# Patient Record
Sex: Male | Born: 1950
Health system: Southern US, Community
[De-identification: ages and names within clinical notes are randomized; demographics above are authoritative.]

## PROBLEM LIST (undated history)

## (undated) DIAGNOSIS — I639 Cerebral infarction, unspecified: Secondary | ICD-10-CM

## (undated) DIAGNOSIS — M549 Dorsalgia, unspecified: Secondary | ICD-10-CM

## (undated) DIAGNOSIS — R569 Unspecified convulsions: Secondary | ICD-10-CM

## (undated) DIAGNOSIS — G459 Transient cerebral ischemic attack, unspecified: Secondary | ICD-10-CM

## (undated) DIAGNOSIS — F32A Depression, unspecified: Secondary | ICD-10-CM

## (undated) DIAGNOSIS — R413 Other amnesia: Secondary | ICD-10-CM

## (undated) DIAGNOSIS — I714 Abdominal aortic aneurysm, without rupture, unspecified: Secondary | ICD-10-CM

## (undated) DIAGNOSIS — G4752 REM sleep behavior disorder: Secondary | ICD-10-CM

## (undated) DIAGNOSIS — N400 Enlarged prostate without lower urinary tract symptoms: Secondary | ICD-10-CM

## (undated) DIAGNOSIS — IMO0002 Reserved for concepts with insufficient information to code with codable children: Secondary | ICD-10-CM

## (undated) DIAGNOSIS — G40209 Localization-related (focal) (partial) symptomatic epilepsy and epileptic syndromes with complex partial seizures, not intractable, without status epilepticus: Secondary | ICD-10-CM

## (undated) DIAGNOSIS — E0590 Thyrotoxicosis, unspecified without thyrotoxic crisis or storm: Secondary | ICD-10-CM

## (undated) DIAGNOSIS — B182 Chronic viral hepatitis C: Secondary | ICD-10-CM

## (undated) DIAGNOSIS — G5600 Carpal tunnel syndrome, unspecified upper limb: Secondary | ICD-10-CM

## (undated) DIAGNOSIS — G8929 Other chronic pain: Secondary | ICD-10-CM

## (undated) DIAGNOSIS — G250 Essential tremor: Secondary | ICD-10-CM

## (undated) DIAGNOSIS — R269 Unspecified abnormalities of gait and mobility: Secondary | ICD-10-CM

## (undated) DIAGNOSIS — N390 Urinary tract infection, site not specified: Secondary | ICD-10-CM

## (undated) DIAGNOSIS — R251 Tremor, unspecified: Secondary | ICD-10-CM

## (undated) DIAGNOSIS — I10 Essential (primary) hypertension: Secondary | ICD-10-CM

## (undated) DIAGNOSIS — F329 Major depressive disorder, single episode, unspecified: Secondary | ICD-10-CM

## (undated) DIAGNOSIS — M1990 Unspecified osteoarthritis, unspecified site: Secondary | ICD-10-CM

## (undated) DIAGNOSIS — K7460 Unspecified cirrhosis of liver: Secondary | ICD-10-CM

## (undated) DIAGNOSIS — I679 Cerebrovascular disease, unspecified: Secondary | ICD-10-CM

## (undated) DIAGNOSIS — S0990XA Unspecified injury of head, initial encounter: Secondary | ICD-10-CM

## (undated) DIAGNOSIS — F419 Anxiety disorder, unspecified: Secondary | ICD-10-CM

## (undated) HISTORY — DX: Localization-related (focal) (partial) symptomatic epilepsy and epileptic syndromes with complex partial seizures, not intractable, without status epilepticus: G40.209

## (undated) HISTORY — DX: Unspecified cirrhosis of liver: K74.60

## (undated) HISTORY — DX: Reserved for concepts with insufficient information to code with codable children: IMO0002

## (undated) HISTORY — DX: Chronic viral hepatitis C: B18.2

## (undated) HISTORY — PX: NECK SURGERY: SHX720

## (undated) HISTORY — DX: Essential (primary) hypertension: I10

## (undated) HISTORY — DX: Tremor, unspecified: R25.1

## (undated) HISTORY — DX: Depression, unspecified: F32.A

## (undated) HISTORY — DX: REM sleep behavior disorder: G47.52

## (undated) HISTORY — DX: Other chronic pain: G89.29

## (undated) HISTORY — DX: Thyrotoxicosis, unspecified without thyrotoxic crisis or storm: E05.90

## (undated) HISTORY — DX: Abdominal aortic aneurysm, without rupture: I71.4

## (undated) HISTORY — DX: Other amnesia: R41.3

## (undated) HISTORY — DX: Essential tremor: G25.0

## (undated) HISTORY — DX: Benign prostatic hyperplasia without lower urinary tract symptoms: N40.0

## (undated) HISTORY — DX: Unspecified abnormalities of gait and mobility: R26.9

## (undated) HISTORY — DX: Anxiety disorder, unspecified: F41.9

## (undated) HISTORY — DX: Abdominal aortic aneurysm, without rupture, unspecified: I71.40

## (undated) HISTORY — DX: Carpal tunnel syndrome, unspecified upper limb: G56.00

## (undated) HISTORY — DX: Major depressive disorder, single episode, unspecified: F32.9

## (undated) HISTORY — PX: OTHER SURGICAL HISTORY: SHX169

## (undated) HISTORY — DX: Dorsalgia, unspecified: M54.9

## (undated) HISTORY — PX: HAND SURGERY: SHX662

## (undated) HISTORY — DX: Cerebrovascular disease, unspecified: I67.9

## (undated) HISTORY — DX: Transient cerebral ischemic attack, unspecified: G45.9

## (undated) HISTORY — DX: Unspecified osteoarthritis, unspecified site: M19.90

---

## 1978-06-30 DIAGNOSIS — B182 Chronic viral hepatitis C: Secondary | ICD-10-CM

## 1978-06-30 HISTORY — DX: Chronic viral hepatitis C: B18.2

## 1997-11-21 ENCOUNTER — Ambulatory Visit (HOSPITAL_COMMUNITY): Admission: RE | Admit: 1997-11-21 | Discharge: 1997-11-22 | Payer: Self-pay | Admitting: *Deleted

## 1998-08-13 ENCOUNTER — Emergency Department (HOSPITAL_COMMUNITY): Admission: EM | Admit: 1998-08-13 | Discharge: 1998-08-13 | Payer: Self-pay | Admitting: Emergency Medicine

## 1998-08-13 ENCOUNTER — Encounter: Payer: Self-pay | Admitting: Emergency Medicine

## 1999-03-17 ENCOUNTER — Encounter: Payer: Self-pay | Admitting: *Deleted

## 1999-03-17 ENCOUNTER — Ambulatory Visit (HOSPITAL_COMMUNITY): Admission: RE | Admit: 1999-03-17 | Discharge: 1999-03-17 | Payer: Self-pay | Admitting: *Deleted

## 1999-05-01 ENCOUNTER — Encounter: Payer: Self-pay | Admitting: *Deleted

## 1999-05-01 ENCOUNTER — Ambulatory Visit (HOSPITAL_COMMUNITY): Admission: RE | Admit: 1999-05-01 | Discharge: 1999-05-01 | Payer: Self-pay | Admitting: *Deleted

## 1999-05-23 ENCOUNTER — Encounter: Payer: Self-pay | Admitting: Emergency Medicine

## 1999-05-23 ENCOUNTER — Emergency Department (HOSPITAL_COMMUNITY): Admission: EM | Admit: 1999-05-23 | Discharge: 1999-05-23 | Payer: Self-pay | Admitting: Emergency Medicine

## 1999-05-29 ENCOUNTER — Emergency Department (HOSPITAL_COMMUNITY): Admission: EM | Admit: 1999-05-29 | Discharge: 1999-05-29 | Payer: Self-pay | Admitting: Emergency Medicine

## 1999-05-29 ENCOUNTER — Encounter: Payer: Self-pay | Admitting: Emergency Medicine

## 1999-07-31 ENCOUNTER — Emergency Department (HOSPITAL_COMMUNITY): Admission: EM | Admit: 1999-07-31 | Discharge: 1999-07-31 | Payer: Self-pay | Admitting: Emergency Medicine

## 1999-07-31 ENCOUNTER — Encounter: Payer: Self-pay | Admitting: Emergency Medicine

## 1999-09-29 ENCOUNTER — Encounter: Payer: Self-pay | Admitting: Emergency Medicine

## 1999-09-29 ENCOUNTER — Emergency Department (HOSPITAL_COMMUNITY): Admission: EM | Admit: 1999-09-29 | Discharge: 1999-09-29 | Payer: Self-pay | Admitting: Emergency Medicine

## 1999-11-03 ENCOUNTER — Emergency Department (HOSPITAL_COMMUNITY): Admission: EM | Admit: 1999-11-03 | Discharge: 1999-11-03 | Payer: Self-pay | Admitting: Emergency Medicine

## 2000-03-02 ENCOUNTER — Emergency Department (HOSPITAL_COMMUNITY): Admission: EM | Admit: 2000-03-02 | Discharge: 2000-03-02 | Payer: Self-pay | Admitting: Emergency Medicine

## 2002-01-11 ENCOUNTER — Encounter: Payer: Self-pay | Admitting: Emergency Medicine

## 2002-01-11 ENCOUNTER — Emergency Department (HOSPITAL_COMMUNITY): Admission: EM | Admit: 2002-01-11 | Discharge: 2002-01-11 | Payer: Self-pay | Admitting: Emergency Medicine

## 2002-02-01 ENCOUNTER — Ambulatory Visit (HOSPITAL_COMMUNITY): Admission: RE | Admit: 2002-02-01 | Discharge: 2002-02-01 | Payer: Self-pay | Admitting: Internal Medicine

## 2002-02-01 ENCOUNTER — Encounter: Payer: Self-pay | Admitting: Internal Medicine

## 2002-05-27 ENCOUNTER — Ambulatory Visit (HOSPITAL_COMMUNITY): Admission: RE | Admit: 2002-05-27 | Discharge: 2002-05-27 | Payer: Self-pay | Admitting: Internal Medicine

## 2002-05-27 ENCOUNTER — Encounter: Payer: Self-pay | Admitting: Internal Medicine

## 2002-11-28 ENCOUNTER — Emergency Department (HOSPITAL_COMMUNITY): Admission: EM | Admit: 2002-11-28 | Discharge: 2002-11-28 | Payer: Self-pay | Admitting: *Deleted

## 2002-12-10 ENCOUNTER — Encounter (HOSPITAL_COMMUNITY): Admission: RE | Admit: 2002-12-10 | Discharge: 2003-01-09 | Payer: Self-pay | Admitting: Internal Medicine

## 2005-01-17 ENCOUNTER — Encounter: Admission: RE | Admit: 2005-01-17 | Discharge: 2005-04-17 | Payer: Self-pay | Admitting: Anesthesiology

## 2005-05-07 ENCOUNTER — Ambulatory Visit: Payer: Self-pay | Admitting: Orthopedic Surgery

## 2005-12-28 ENCOUNTER — Inpatient Hospital Stay (HOSPITAL_COMMUNITY): Admission: EM | Admit: 2005-12-28 | Discharge: 2005-12-31 | Payer: Self-pay | Admitting: Emergency Medicine

## 2006-09-12 ENCOUNTER — Ambulatory Visit (HOSPITAL_COMMUNITY): Admission: RE | Admit: 2006-09-12 | Discharge: 2006-09-12 | Payer: Self-pay | Admitting: Family Medicine

## 2006-10-05 ENCOUNTER — Inpatient Hospital Stay (HOSPITAL_COMMUNITY): Admission: EM | Admit: 2006-10-05 | Discharge: 2006-10-07 | Payer: Self-pay | Admitting: Emergency Medicine

## 2009-12-29 ENCOUNTER — Emergency Department (HOSPITAL_COMMUNITY): Admission: EM | Admit: 2009-12-29 | Discharge: 2009-12-29 | Payer: Self-pay | Admitting: Family Medicine

## 2010-05-13 ENCOUNTER — Encounter: Payer: Self-pay | Admitting: Internal Medicine

## 2010-05-14 ENCOUNTER — Encounter: Payer: Self-pay | Admitting: Internal Medicine

## 2010-05-16 ENCOUNTER — Emergency Department (HOSPITAL_COMMUNITY): Admission: EM | Admit: 2010-05-16 | Discharge: 2010-05-16 | Payer: Self-pay | Admitting: Emergency Medicine

## 2010-05-18 ENCOUNTER — Emergency Department (HOSPITAL_COMMUNITY): Admission: EM | Admit: 2010-05-18 | Discharge: 2010-05-19 | Payer: Self-pay | Admitting: Emergency Medicine

## 2010-05-30 ENCOUNTER — Ambulatory Visit (HOSPITAL_COMMUNITY): Admission: RE | Admit: 2010-05-30 | Discharge: 2010-05-30 | Payer: Self-pay | Admitting: Family Medicine

## 2010-06-03 ENCOUNTER — Emergency Department (HOSPITAL_COMMUNITY)
Admission: EM | Admit: 2010-06-03 | Discharge: 2010-06-04 | Payer: Self-pay | Source: Home / Self Care | Admitting: Emergency Medicine

## 2010-06-04 ENCOUNTER — Ambulatory Visit (HOSPITAL_COMMUNITY): Admission: RE | Admit: 2010-06-04 | Payer: Self-pay | Admitting: Internal Medicine

## 2010-06-04 ENCOUNTER — Ambulatory Visit (HOSPITAL_COMMUNITY)
Admission: RE | Admit: 2010-06-04 | Discharge: 2010-06-04 | Payer: Self-pay | Source: Home / Self Care | Admitting: Emergency Medicine

## 2010-06-30 DIAGNOSIS — I639 Cerebral infarction, unspecified: Secondary | ICD-10-CM

## 2010-06-30 HISTORY — DX: Cerebral infarction, unspecified: I63.9

## 2010-07-19 ENCOUNTER — Emergency Department (HOSPITAL_COMMUNITY)
Admission: EM | Admit: 2010-07-19 | Discharge: 2010-07-19 | Payer: Self-pay | Source: Home / Self Care | Admitting: Emergency Medicine

## 2010-07-22 LAB — POCT I-STAT, CHEM 8
BUN: 18 mg/dL (ref 6–23)
Calcium, Ion: 1.09 mmol/L — ABNORMAL LOW (ref 1.12–1.32)
Chloride: 104 mEq/L (ref 96–112)
Creatinine, Ser: 1 mg/dL (ref 0.4–1.5)
Glucose, Bld: 80 mg/dL (ref 70–99)
HCT: 45 % (ref 39.0–52.0)
Hemoglobin: 15.3 g/dL (ref 13.0–17.0)
Potassium: 4 mEq/L (ref 3.5–5.1)
Sodium: 140 mEq/L (ref 135–145)
TCO2: 27 mmol/L (ref 0–100)

## 2010-07-22 LAB — POCT CARDIAC MARKERS
CKMB, poc: <1 ng/mL — ABNORMAL LOW (ref 1.0–8.0)
Myoglobin, poc: 44.3 ng/mL (ref 12–200)
Troponin i, poc: <0.05 ng/mL (ref 0.00–0.09)

## 2010-07-30 NOTE — Letter (Signed)
Summary: triage order  triage order   Imported By: Rosine Beat 05/13/2010 12:40:08  _____________________________________________________________________  External Attachment:    Type:   Image     Comment:   External Document  Appended Document: triage order ok as is  Appended Document: triage order mailed instructions to the patient  Appended Document: triage order Pt said he was not having his tcs right now. He will call us when he wants to have his tcs done.

## 2010-07-30 NOTE — Letter (Signed)
Summary: TCS ORDER/TRIAGE  TCS ORDER/TRIAGE   Imported By: Rexene Alberts 05/14/2010 09:44:03  _____________________________________________________________________  External Attachment:    Type:   Image     Comment:   External Document

## 2010-09-10 LAB — CBC
HCT: 44.8 % (ref 39.0–52.0)
Hemoglobin: 15.6 g/dL (ref 13.0–17.0)
MCH: 32.5 pg (ref 26.0–34.0)
MCHC: 34.7 g/dL (ref 30.0–36.0)
MCV: 93.6 fL (ref 78.0–100.0)
Platelets: 183 10*3/uL (ref 150–400)
RBC: 4.79 MIL/uL (ref 4.22–5.81)
RDW: 12.2 % (ref 11.5–15.5)
WBC: 5.6 10*3/uL (ref 4.0–10.5)

## 2010-09-10 LAB — DIFFERENTIAL
Basophils Absolute: 0 10*3/uL (ref 0.0–0.1)
Basophils Relative: 0 % (ref 0–1)
Eosinophils Absolute: 0 10*3/uL (ref 0.0–0.7)
Eosinophils Relative: 0 % (ref 0–5)
Lymphocytes Relative: 24 % (ref 12–46)
Lymphs Abs: 1.3 10*3/uL (ref 0.7–4.0)
Monocytes Absolute: 0.4 10*3/uL (ref 0.1–1.0)
Monocytes Relative: 7 % (ref 3–12)
Neutro Abs: 3.8 10*3/uL (ref 1.7–7.7)
Neutrophils Relative %: 69 % (ref 43–77)

## 2010-09-10 LAB — BASIC METABOLIC PANEL
BUN: 15 mg/dL (ref 6–23)
CO2: 27 mEq/L (ref 19–32)
Calcium: 9 mg/dL (ref 8.4–10.5)
Chloride: 94 mEq/L — ABNORMAL LOW (ref 96–112)
Creatinine, Ser: 0.63 mg/dL (ref 0.4–1.5)
GFR calc Af Amer: >60 mL/min (ref >60)
GFR calc non Af Amer: >60 mL/min (ref >60)
Glucose, Bld: 125 mg/dL — ABNORMAL HIGH (ref 70–99)
Potassium: 3.7 mEq/L (ref 3.5–5.1)
Sodium: 129 mEq/L — ABNORMAL LOW (ref 135–145)

## 2010-09-10 LAB — URINALYSIS, ROUTINE W REFLEX MICROSCOPIC
Bilirubin Urine: NEGATIVE
Glucose, UA: 100 mg/dL — AB
Hgb urine dipstick: NEGATIVE
Ketones, ur: NEGATIVE mg/dL
Nitrite: NEGATIVE
Protein, ur: NEGATIVE mg/dL
Specific Gravity, Urine: >1.03 — ABNORMAL HIGH (ref 1.005–1.030)
Urobilinogen, UA: 0.2 mg/dL (ref 0.0–1.0)
pH: 6 (ref 5.0–8.0)

## 2010-09-10 LAB — RAPID URINE DRUG SCREEN, HOSP PERFORMED
Amphetamines: NOT DETECTED
Barbiturates: NOT DETECTED
Benzodiazepines: POSITIVE — AB
Cocaine: NOT DETECTED
Opiates: NOT DETECTED
Tetrahydrocannabinol: NOT DETECTED

## 2010-09-10 LAB — ETHANOL: Alcohol, Ethyl (B): <5 mg/dL (ref 0–10)

## 2010-11-15 NOTE — Discharge Summary (Signed)
NAME:  Eric Tran, Eric Tran NO.:  [id number]   MEDICAL RECORD NO.:  [id number]          PATIENT TYPE:  INP   LOCATION:  A337                          FACILITY:  [facility]   PHYSICIAN:  Skeet Latch, DO    DATE OF BIRTH:  [birth year]   DATE OF ADMISSION:  10/05/2006  DATE OF DISCHARGE:  04/09/2008LH                               DISCHARGE SUMMARY   DISCHARGE DIAGNOSES:  1. Altered mental status change.  2. Polysubstance abuse.  3. Pneumonia.  4. Dehydration.  5. Hypokalemia.   For details regarding admission diagnoses and History of Present  Illness, please refer to History and Physical.   BRIEF HOSPITAL COURSE:  This is a 60 year old Caucasian male who  presented from jail for altered mental status changes.  The patient has  a history of polysubstance abuse, hypertension and chronic neck pain.  Apparently patient was lethargic for a few days at jail and he was not  taking  his meals or medications.  When patient was brought to the  emergency room he was lethargic, unable to communicate, was found to  have a temperature of 103 and was not able to give any history at that  time.  On admission, patient denied any headache, was cooperative, began  to become more arousable and did not have any new complaints.  Patient  was seen, admitted to the ICU with altered mental status changes.  She  had a drug screen that was positive for benzodiazepines.  Patient did  have a CT of his head without contrast performed which was negative.  Patient also did have a chest x-ray which showed a patchy left lower  infiltrate consistent with pneumonia.  The patient was placed on IV  antibiotics as well as IV fluids.  He was found to a little dehydrated  and tachycardic.  Patient stated previously he was also febrile, had  some leukocytosis, was also placed on IV antibiotics empirically.  Today  patient is afebrile, leukocytosis has resolved, he is no longer  tachycardic.  He is stable and  is ready for discharge at this time.   LABS ON DISCHARGE:  White blood cell count was 10.2, hemoglobin 12.3,  hematocrit 34.3, platelets 194,000.  Sodium 141, potassium 3.3, chloride  109, CO2 27, glucose 119, BUN 2, creatinine 0.41.  Alkaline phosphatase  37, AST 13, ALT 14.  Of note, patient did have a consultation with  Neurology regarding altered mental status changes.  Per Neurology, the  etiology of his decreased level of consciousness and mental status  changes was unclear.  Said possibly in the future patient may need  empiric seizure medication, but at this time will not prescribe any  medicines but he needs to be followed for the recurrent loss of  consciousness or altered mental status changes.  Currently in the room  patient is doing well, is responding to questions appropriately.  He is  alert and oriented and cooperative.   MEDICATIONS ON DISCHARGE:  1. Z-Pak as directed.  2. Augmentin 875 mg one tab p.o. b.i.d.  3. Catapres 0.1 mg p.o. b.i.d.  4. Enalapril  10 mg daily.  5. Fentanyl patch 100 mcg q.3 days.   DISPOSITION:  The patient will be discharged back to jail.   INSTRUCTIONS:  He is to return to regular activity as tolerated.   DIET:  Patient is to maintain a low-sodium heart healthy diet.   FOLLOWUP:  Patient is to followup with the primary physician in the  correction facility in at least 7-10 days, he will continue his  antibiotic therapy.  Patient may need a followup chest x-ray in the next  10-14 days.      Skeet Latch, DO  Electronically Signed     SM/MEDQ  D:  10/07/2006  T:  10/07/2006  Job:  [id number]   cc:   [facility]

## 2010-11-15 NOTE — Procedures (Signed)
NAME:  Eric Tran, Eric Tran NO.:  [id number]   MEDICAL RECORD NO.:  [id number]          PATIENT TYPE:  OUT   LOCATION:  RESP                          FACILITY:  [facility]   PHYSICIAN:  Kofi A. Gerilyn Pilgrim, M.D. DATE OF BIRTH:  05-21-1951   DATE OF PROCEDURE:  09/12/2006  DATE OF DISCHARGE:                              EEG INTERPRETATION   This is a 60 year old man who presents with shaking of the right arm  suspicious for seizure activity.   MEDICATIONS:  Xanax, blood pressure medication and antidepressant.   ANALYSIS:  A 16-channel recording is conducted for 33 minutes.  There is  a well-formed posterior dominant rhythm of 10 hertz which attenuates  with eye opening.  There is beta activity noted in the frontal areas.  Awake and drowsy activities are noted.  Photic stimulation does not  result in any significant changes in the background activity.  There is  no focal slowing, lateralized slowing or epileptiform activity noted.   IMPRESSION:  This is a normal recording of the awake and drowsy states.  A single recording does not rule out epileptic seizures.  If clinically  indicated, a sleep deprived recording could be useful.      Kofi A. Gerilyn Pilgrim, M.D.  Electronically Signed     KAD/MEDQ  D:  09/12/2006  T:  09/13/2006  Job:  [id number]

## 2010-11-15 NOTE — Discharge Summary (Signed)
NAME:  Eric Tran, Eric Tran NO.:  [id number]   MEDICAL RECORD NO.:  [id number]          PATIENT TYPE:  INP   LOCATION:  A203                          FACILITY:  [facility]   PHYSICIAN:  Patrica Duel, M.D.    DATE OF BIRTH:  [birth year]   DATE OF ADMISSION:  12/27/2005  DATE OF DISCHARGE:  07/04/2007LH                                 DISCHARGE SUMMARY   DISCHARGE DIAGNOSES:  1.  Acute withdrawal syndrome.  2.  Chronic pain syndrome with history of cervical disk disease with      radiculopathy, status post fusion and titanium plate placement.  3.  Narcotic overuse.  4.  Hypertension.   For details regarding admission, please refer to the chart.   BRIEF HISTORY:  This 60 year old male with the history as noted above was  incarcerated for illegally distributing controlled substances.  Approximately 36 hours into his incarceration, he experienced severe  tremors, abdominal pain, cramping, diarrhea, and vomiting.  He was released  on bail after four days.  He came to the emergency department with the above  complaints.  He was found to be agitated and mildly hypertensive, as well as  confused.  His CT scan was unremarkable.  His history was very compatible  with acute withdrawal syndrome.  Of note, the patient admits to taking at  least eight Tylenol #4 per day for 20 years or more.  At times, his narcotic  usage was much greater than that.   COURSE IN THE HOSPITAL:  The patient had intermittent confusion and  agitation.  He was treated with Clonidine, Ativan, as well as resumption of  Tylenol #4.  CT scan was repeated with no significant change.  He did have  some lacunar infarcts of the basal ganglia noted.   The patient's blood pressure responded nicely to Clonidine.   Dr. Gerilyn Pilgrim saw the patient and agreed with our assessment.  Fentanyl patch  was suggested by the patient and this was instituted on July 3.  Currently,  the patient is alert, oriented, calm.  His blood  pressure was somewhat low  overnight.  However, it has normalized nicely.  His Clonidine dose will be  decreased as an outpatient.   The patient was seen twice by the ACT team.  Inpatient treatment and/or  commitment were not appropriate by their assessment.  They felt he could be  managed as an outpatient.   Currently, the patient is doing very well and is strongly requesting  discharge.  His appetite has improved. He is fully alert and oriented and  stable for discharge.  He understands the ramifications of his over usage  and, hopefully, will respond to intensive outpatient management and be  better controlled with long-acting opioids in regards to his pain, as well  as narcotic addiction.   DISPOSITION:  Medications will include:  1.  Clonidine 0.1 mg b.i.d.  2.  Enalapril 10 mg daily.  3.  Fentanyl patch 25 mcg per hour, change every three days.  4.  Ativan 1 mg t.i.d. p.r.n. agitation or anxiety.  5.  Tylenol #4  one q.i.d.  He will taper these over two weeks.   He will be followed at [facility] as an outpatient.      Patrica Duel, M.D.  Electronically Signed     MC/MEDQ  D:  12/31/2005  T:  12/31/2005  Job:  [id number]

## 2010-11-15 NOTE — H&P (Signed)
NAME:  Eric Tran, NEEDLE NO.:  [id number]   MEDICAL RECORD NO.:  [id number]         PATIENT TYPE:  INP   LOCATION:  IC08                          FACILITY:  [facility]   PHYSICIAN:  Marcello Moores, MD   DATE OF BIRTH:  24-Jun-1951   DATE OF ADMISSION:  10/05/2006  DATE OF DISCHARGE:  LH                              HISTORY & PHYSICAL   CHIEF COMPLAINT:  He was sent from prison for altered mental status.   HISTORY OF PRESENT ILLNESS:  Eric Tran is a 60 year old man with  history of polysubstance abuse, history of hypertension and history of  chronic neck pain.  Currently, he is in jail and brought from the jail  for altered mental status.  The patient is lethargic and currently  noncommunicative and it was difficult to extract any valuable history  from him, but according to the nurse from the prison, she said that the  patient was lethargic on and off and he was not taking his meals  properly for the last several days.  He had no specific complaints.  She  did not record any fever and she did not use any cough or other  complaints, but today he was more lethargic and noncommunicative and  they brought him to the emergency room.  In the emergency room, his  temperature was 103 and was unable to give any history to the emergency  physician.  When I came to interview him, he responded very few words.  He denied headache and he was cooperative to flex his neck when I  requested that and he said he has no pain when he tries to reflex his  neck.  He responded that he does not know his problem when I ask him to  tell me if he has any problem.  In 2007, he was also admitted with  similar complaints and problems that was attributed at that time likely  secondary to drug withdrawal and he was at prison at that time as well.   REVIEW OF SYSTEMS:  The 10-point review of systems is limited as the  patient's communication is limited.   ALLERGIES:  No known drug allergies.   SOCIAL HISTORY:  He is in prison and is a polysubstance abuse.  We do  not know the specifics.   FAMILY HISTORY:  Not known.   PAST MEDICAL HISTORY:  1. Status post neck surgery with chronic pain.  2. History of hypertension.  3. Polysubstance abuse.   HOME MEDICATIONS:  1. Tylenol with codeine.  2. Enalapril, unspecified dose.   PHYSICAL EXAMINATION:  GENERAL:  The patient is lying in the bed,  lethargic, not communicating well, but no acute respiratory distress.  VITAL SIGNS:  Blood pressure is 138/82, temperature is 103, pulse rate  is 108, respiratory rate 22, saturation is 99% on room air. HEENT:  He  has pink conjunctivae.  Nonicteric sclera.  Pupils are reactive and  equal on both sides, slightly dry buccal mucosa.  Throat is clear.  NECK:  He has supple neck and able to flex his neck actively and  passively.  No pain.  No lymphadenopathy.  CHEST:  Good air entry bilaterally.  No crepitus appreciated.  CARDIAC:  S1-S2 regular.  No murmur, tachycardiac.  ABDOMEN:  Soft.  Normoactive bowel sounds.  No area of tenderness.  EXTREMITIES:  He has dry, multiple skin lesions on both upper  extremities.  A few of them are fresh of a few days with background of  hyperemic areas.  Otherwise, there is no pedal edema and peripheral  pulses are positive.  NEUROLOGIC:  He is lethargic, responding only for a few questions,  otherwise there is not any neurological deficit and there is not any  sign of meningeal irritation.   LABORATORY DATA AND X-RAY FINDINGS:  ABG on room air with pH is 7.5,  pCO2 27, pO2 75, bicarb 23 and saturation is 96%.  CBC white blood cells  19.4, hemoglobin 13.7, hematocrit 39, platelet count is 276.  PT/INR  15.7 and 1.2.  On the chemistries, sodium is 139, potassium is 3.5,  chloride is 102, glucose is 139, BUN 15, creatinine 0.6.  Calcium level  is 9.2.  Liver function test is normal except total bilirubin is 1.9.  Urine drug screen is positive for  benzodiazepine.  Urinalysis is  negative.   Chest x-ray in the preliminary report mentioned some scattered patchy  infiltrate.  CAT scan of the head is in progress.   ASSESSMENT:  1. Altered mental status with lethargy and some confusion, probably      secondary to some drug withdrawal.  The patient was in prison only      for the last 11 days and he started to have this change of behavior      for the last 5-6 days.  This could raise the possibility of not      getting what he was used to get outside, even though it is not easy      to be specific.  2. Dehydration.  The patient was not taking meal and fluid properly      for the last several days and he has tachycardia, a little dry      mucosa and we will rehydrate him with IV fluid.  3. Leukocytosis plus fever and skin lesions.  This could be secondary      to the skin lesions with some infection.  Even though my      methicillin-resistant Staphylococcus aureus suspicion is low, I      will put him on vancomycin today and will consider changing to oral      medications depending on the response of the skin lesions and blood      culture and a general clinical response of the patient.  4. Possible pneumonia.  The patient has already started on Rocephin      and I will continue with the Rocephin and Zithromax.   PLAN:  I will admit the patient on the second floor telemetry.  We will  monitor his vitals and will watch for any abnormal behavior or seizure  and we will act accordingly.      Marcello Moores, MD  Electronically Signed     MT/MEDQ  D:  10/05/2006  T:  10/06/2006  Job:  [id number]

## 2010-11-15 NOTE — Consult Note (Signed)
NAME:  Eric Tran, ZABRISKIE NO.:  [id number]   MEDICAL RECORD NO.:  [id number]          PATIENT TYPE:  INP   LOCATION:  A337                          FACILITY:  [facility]   PHYSICIAN:  Kofi A. Gerilyn Pilgrim, M.D. DATE OF BIRTH:  10-11-1950   DATE OF CONSULTATION:  10/06/2006  DATE OF DISCHARGE:  10/07/2006                                 CONSULTATION   HISTORY OF PRESENT ILLNESS:  This is a 60 year old white male who was  found to be lethargic, noncommunicative and apparently had not been  eating his meals.  There appears to be no specific complaints.  The  patient was taken to the emergency room where he was noted to have right  lower lobe infiltrate and fever.  He was admitted because of pneumonia.  Neurologic consultation was elicited due to be what appears to be  recurrent spells of lethargy and unresponsiveness.  The nursing person  indicates to staff that the patient apparently has been having these  recurrent spells of lethargy, unresponsive even before his current acute  illness.   PAST MEDICAL HISTORY:  1. Hypertension.  2. Polysubstance drug abuse.  3. Chronic neck pain.   ADMISSION MEDICATIONS:  1. Tylenol with codeine.  2. Enalapril.   FAMILY HISTORY:  Not known.   SOCIAL HISTORY:  He is currently in the prison system.  Specific for the  patient does have a history of polysubstance drug abuse.   ALLERGIES:  None known.   REVIEW OF SYSTEMS:  As stated in the history of present illness.  The  patient seems back at his baseline during the evaluation and does not  complain of headache or neck pain.  No focal neurologic problems are  reported.   PHYSICAL EXAMINATION:  VITAL SIGNS:  Temperature 96.8, pulse 76,  respirations 18, blood pressure 119/75.  GENERAL APPEARANCE:  The patient is sitting up in the bed attempting to  eat his meal.  He is awake, alert and converses well.  He did recognize  me from previous encounters.  He is lucid and oriented.  Does  follow  commands well.  No dysarthria or aphasias noted.  CRANIAL NERVE EVALUATION:  Pupils were equal, round, reactive to light  and accommodation.  Extraocular movements are intact.  Visual fields are  full.  Extraocular movements are intact.  Facial muscle strength is  symmetric.  Tongue is midline.  MOTOR:  Examination shows normal tone, bulk and strength.  COORDINATION:  Intact.  There are no tremors or dysmetria.  REFLEXES:  Preserved.  Plantar reflexes were downgoing.  SENSATION:  Normal to temperature and light touch.   LABORATORY DATA:  CT scan shows no acute findings and essentially  unrevealing.  Chest CT shows patchy left lower lobe infiltrate  consistent with pneumonia.   ASSESSMENT:  1. Recurrent spells of lethargy, unresponsive, unclear etiology.  2. Acute pneumonia.   RECOMMENDATIONS:  Agree with the current care.  I would obtain another  EEG.  Did have a previous one done March of this year which was  unrevealing apparently for similar problems.   Thank you for this  consultation.      Kofi A. Gerilyn Pilgrim, M.D.  Electronically Signed     KAD/MEDQ  D:  10/08/2006  T:  10/08/2006  Job:  [id number]

## 2010-11-15 NOTE — Procedures (Signed)
NAME:  BRAYLAN, FAUL NO.:  [id number]   MEDICAL RECORD NO.:  [id number]          PATIENT TYPE:  INP   LOCATION:  A337                          FACILITY:  [facility]   PHYSICIAN:  Kofi A. Gerilyn Pilgrim, M.D. DATE OF BIRTH:  [birth year]   DATE OF PROCEDURE:  10/07/2006  DATE OF DISCHARGE:                              EEG INTERPRETATION   PROCEDURE:  EEG.   CHIEF COMPLAINT:  This is a 60 year old man who presents with episodic  alteration of consciousness suspicious for seizures/nonconvulsive  seizures.   MEDICATIONS:  Tylenol with codeine and enalapril.   ANALYSIS:  This is a 16 channel recording conducted for approximately 20  minutes.  There is a posterior rhythm of 10.5 to 11 hertz which  attenuates with eye opening.  There is beta activity noted in the  frontal areas.  Photic simulation is conducted and there is no  significant change in the background activity.  Brief stage II sleep is  observed.  The patient is noted to have lead popping artifact in the  left temporal region but no clear epileptiform activity is observed.  There is no clear focal or lateralized slowing.   IMPRESSION:  This is a normal recording of awake and sleep states.      Kofi A. Gerilyn Pilgrim, M.D.  Electronically Signed     KAD/MEDQ  D:  10/07/2006  T:  10/07/2006  Job:  [id number]

## 2010-11-15 NOTE — Consult Note (Signed)
NAME:  Eric Tran, Eric Tran NO.:  [id number]   MEDICAL RECORD NO.:  [id number]          PATIENT TYPE:  INP   LOCATION:  A203                          FACILITY:  [facility]   PHYSICIAN:  Kofi A. Gerilyn Pilgrim, M.D. DATE OF BIRTH:  [birth year]   DATE OF CONSULTATION:  12/30/2005  DATE OF DISCHARGE:  12/31/2005                                   CONSULTATION   This is a 60 year old man who has a long history of chronic pain syndrome  due to degenerative changes involving the cervical region.  The patient is  status post cervical fusion with Titanium plate.  He has been on a long  course of opioids, particularly Tylenol with Codeine.  More recently he has  been placed on OxyContin.  The patient apparently was arrested per the chart  on suspicion of illegal activity with opioids and a possible diversion.  He  was incarcerated for 3-4 days when he started having significant withdrawal  symptoms with hallucinations and confusion.  The patient has been  hospitalized and has been started on Tylenol #3 and clonidine.  The patient  has had imaging x2 which has been unremarkable for anything acute.  He has  had elevated blood pressure on admission in the 170's/110 which had improved  since he has been hospitalized.  The patient's cognition has improved.  He  denies illicit activity of these medications but he is currently somewhat  tangential in speech and there is some evidence of confabulation.   PAST MEDICAL HISTORY:  1.  Again, he is status post cervical fusion.  2.  He has chronic skin infection due to MRSA.  3.  Chronic pain syndrome.  4.  There is also a history of back pain and knee pain due to      osteoarthritis.  5.  History of hypertension.   HOME MEDICATIONS:  1.  OxyContin three times a day.  2.  Tylenol #3, which we prescribed both of these medications.  3.  He is also taking blood pressure medication, imipramine.   PHYSICAL EXAMINATION:  Temperature 98.3, pulse 58,  respirations 10-20, blood  pressure 105/70.  HEENT EVALUATION:  Neck is supple.  Head is normocephalic, atraumatic.  SKIN:  Reveals multiple skin eruptions which are chronic.  The patient is  being treated by Dr. Anne Hahn there.  ABDOMEN:  Soft.  EXTREMITIES:  Shows no varicosities or edema.  MENTATION:  Patient is awake.  Initially thought I was Dr. Anne Hahn.  After  several minutes he was able to say my first and second name.  He does not  exhibit hallucinations at this time, although his speech is tangential.  There is also some confabulation of time.  No dysarthria or aphasia is  noted.  He has good fluency noted.  He follows commands well.  CRANIAL NERVE EVALUATION:  Pupils are equal, round and reactive, visual  fields are intact.  Extraocular movements are full.  Facial muscles are  dysmetric.  Tongue is midline, uvula is midline.  Shoulder shrugs normal.  MOTOR EXAMINATION:  Shows normal tone, bulk and strength.  COORDINATION:  There is a little bit of intention tremor bilaterally.  No  clear dysmetria noted.   Head CT shows dilated perivascular spaces involving the basal ganglia but  nothing acute seen.   ASSESSMENT:  1.  Acute confusional state/delirium due to medication withdrawal.  2.  Suspected irregular behavior with opioids.  3.  Chronic pain involving the neck and low back region due to chronic      degenerative disk disease.   RECOMMENDATIONS:  Probably should switch his opioids to a more long-acting  agent such as Fentanyl patch, hopefully he can tolerate this; we can use  antiemetics if nausea develops.   Thanks for this consultation.      Kofi A. Gerilyn Pilgrim, M.D.  Electronically Signed     KAD/MEDQ  D:  01/02/2006  T:  01/02/2006  Job:  [id number]

## 2010-11-15 NOTE — H&P (Signed)
NAME:  Eric Tran, POKORNY NO.:  [id number]   MEDICAL RECORD NO.:  [id number]          PATIENT TYPE:  INP   LOCATION:  A203                          FACILITY:  [facility]   PHYSICIAN:  Patrica Duel, M.D.    DATE OF BIRTH:  Oct 21, 1950   DATE OF ADMISSION:  12/28/2005  DATE OF DISCHARGE:  LH                                HISTORY & PHYSICAL   CHIEF COMPLAINT:  Confusion.   HISTORY OF PRESENT ILLNESS:  This is a 60 year old male with a longstanding  history of hypertension.  He has chronic pain syndrome secondary to cervical  disk disease with chronic radiculopathy.  He is status post fusion and  titanium plate placement of his cervical spine.  He has been treated by Dr.  Sherwood Gambler chronically with Tylenol #4, which he takes approximately eight per  day with reasonable control of his pain.   The patient was apparently investigated and subsequently arrested for  illegally distributing controlled substances.  Details are lacking at this  time.  He was incarcerated for four days.  Thirty-six hours into his  incarceration, the patient began to experience severe tremulousness,  abdominal pain, cramping, diarrhea, as well as vomiting.  The symptoms  continued.  He was released on bail.  He continued to experience these  symptoms and was brought to the emergency room for evaluation, at my urging.   In the emergency department, the patient was somewhat agitated and mildly  hypertensive and confused.  His CT scan was unremarkable.  It is quite  apparent the patient is suffering from acute withdrawal from narcotics after  years of daily use for pain control.   The patient is admitted with acute withdrawal syndrome, and further  evaluation and therapy is indicated.   There is no history of headache, neurologic deficits except for episodic  confusion and agitation, chest pain, shortness of breath, melena,  hematemesis, hematochezia, or genitourinary symptoms.   CURRENT MEDICATIONS:  1.  Enalapril, dose unknown, 1 daily.  2.  Tylenol #4 2 q.i.d.   ALLERGIES:  No known.   PAST MEDICAL HISTORY:  As noted.   SOCIAL HISTORY:  He is a cigarette smoker and admits to occasional use of  alcohol only.  No other forms of illicit drug use admitted to.   FAMILY HISTORY:  Noncontributory.   REVIEW OF SYSTEMS:  Negative except as mentioned.   PHYSICAL EXAMINATION:  VITAL SIGNS:  Currently BP 168/107, pulse 70, temp  98.4, respirations 20.  GENERAL:  This is currently an alert male who is in no acute distress (has  received Ativan as well as hydrocodone since admission).  HEENT:  Normocephalic and atraumatic.  Pupils are equal.  Ears, nose, and  throat benign.  NECK:  Supple.  LUNGS:  Clear.  HEART:  The heart sounds are normal.  ABDOMEN:  Nontender, nondistended.  Bowel sounds are intact.  EXTREMITIES:  No clubbing, cyanosis or edema.  NEUROLOGIC:  Nonfocal.  There is no tremor, no flap, no focal deficits  noted.  Toes are downgoing.  He is fully alert and oriented x3.  LABS:  Hemogram normal.  Chemistries normal except for a potassium of 3.2.  Amylase and lipase normal.   CT brain negative for acute disease.   ASSESSMENT:  Acute withdrawal syndrome:  Most likely manifested by  agitation.  Mental status changes currently improved.  Also has hypertension  chronically, although it is exacerbated by withdrawal syndrome.   PLAN:  Admit for hydration and resumption of his previous medications of  p.r.n. Ativan as well as p.r.n. clonidine.  Will follow and treat  expectantly.      Patrica Duel, M.D.  Electronically Signed     MC/MEDQ  D:  12/28/2005  T:  12/28/2005  Job:  [id number]

## 2011-03-05 ENCOUNTER — Other Ambulatory Visit (HOSPITAL_COMMUNITY): Payer: Self-pay | Admitting: Family Medicine

## 2011-03-05 DIAGNOSIS — K759 Inflammatory liver disease, unspecified: Secondary | ICD-10-CM

## 2011-03-10 ENCOUNTER — Ambulatory Visit (HOSPITAL_COMMUNITY)
Admission: RE | Admit: 2011-03-10 | Discharge: 2011-03-10 | Disposition: A | Discharge status: Home / Self Care | Payer: Self-pay | Source: Ambulatory Visit | Attending: Family Medicine | Admitting: Family Medicine

## 2011-03-10 DIAGNOSIS — K8020 Calculus of gallbladder without cholecystitis without obstruction: Secondary | ICD-10-CM | POA: Insufficient documentation

## 2011-03-10 DIAGNOSIS — B1920 Unspecified viral hepatitis C without hepatic coma: Secondary | ICD-10-CM | POA: Insufficient documentation

## 2011-03-10 DIAGNOSIS — R932 Abnormal findings on diagnostic imaging of liver and biliary tract: Secondary | ICD-10-CM | POA: Insufficient documentation

## 2011-03-10 DIAGNOSIS — K759 Inflammatory liver disease, unspecified: Secondary | ICD-10-CM

## 2011-04-28 LAB — COMPREHENSIVE METABOLIC PANEL
ALT: 139 U/L — AB (ref 10–40)
AST: 101 U/L
Albumin: 4.4
Alkaline Phosphatase: 293 U/L
BUN: 21 mg/dL (ref 4–21)
CO2: 28 mmol/L
Calcium: 9.7 mg/dL
Chloride: 100 mmol/L
Creat: 0.63
Glucose: 102 mg/dL
Potassium: 4.5 mmol/L
Sodium: 140 mmol/L (ref 137–147)
Total Bilirubin: 3.2 mg/dL
Total Protein ELP: 7.6

## 2011-05-01 ENCOUNTER — Ambulatory Visit (INDEPENDENT_AMBULATORY_CARE_PROVIDER_SITE_OTHER): Payer: Self-pay | Admitting: Urgent Care

## 2011-05-01 ENCOUNTER — Encounter: Payer: Self-pay | Admitting: Urgent Care

## 2011-05-01 DIAGNOSIS — R634 Abnormal weight loss: Secondary | ICD-10-CM

## 2011-05-01 DIAGNOSIS — R7989 Other specified abnormal findings of blood chemistry: Secondary | ICD-10-CM | POA: Insufficient documentation

## 2011-05-01 DIAGNOSIS — B182 Chronic viral hepatitis C: Secondary | ICD-10-CM | POA: Insufficient documentation

## 2011-05-01 NOTE — Progress Notes (Signed)
Cc to PCP 

## 2011-05-01 NOTE — Progress Notes (Signed)
Referring Provider: [facility] Dept Primary Care Physician:  Tylene Fantasia., PA, PA-C Primary Gastroenterologist:  Dr. Jena Gauss  Chief Complaint  Patient presents with  . Cirrhosis  . Hepatitis C   HPI:  Eric Tran is a 60 y.o. male here as a referral from [place], [place] @ [facility] Dept for chronic hepatitis C, elevated liver enzymes & cirrhosis.  Hx Chronic back pain.  Was having dizzy spells & blacked out after getting up from chair.  Another episode when he was getting out of his car.  He has had head CT/MRI with old infarct.  Denies abd pain, nausea, vomiting.  Diarrhea last week for 4 days.  C/o fatigue, malaise, joint pain & headaches.  C/o left lower back pain that radiates left hip.  Numbness & tingling left arm since CVA.  Denies hx IV or intranasal drug use.  1 tattoo.  Denies blood transfusion.  C/o anorexia.  Wt loss of over 30# in 6 months.  Recently started to gain some back.  Health dept following for hyperthyroidism-taking meds, but pt did not bring med.  US thyroid @ [place] next week.  Thinks he had colonoscopy not long ago but can't remember?    Tot bili 3.2 ALP 293 AST 101 ALT 139  03/05/11 ABD US->Cholelithiasis without evidence of acute cholecystitis.  Minimally prominent CBD 7 mm diameter, recommend correlation with  LFTs.   Question minimally nodular margins of the left lobe of the liver,  could reflect subtle changes of cirrhosis though no other focal  hepatic abnormalities are sonographically visualized.  Past Medical History  Diagnosis Date  . Hyperthyroidism   . Chronic back pain     chronic narcotic use  . Degenerative disc disease   . Carpal tunnel syndrome   . Arthritis   . Hypertension   . Chronic hepatitis C 1980    never had any treatment  . Cirrhosis   . CVA (cerebral infarction) 06/2010  . Anxiety   . Depression    Past Surgical History  Procedure Date  . Back surgery     C4-C5 fusion  . Hand surgery       right   Current Outpatient Prescriptions  Medication Sig Dispense Refill  . acetaminophen-codeine (TYLENOL #3) 300-30 MG per tablet Take 1 tablet by mouth every 4 (four) hours as needed.        . ALPRAZolam (XANAX) 0.5 MG tablet Take 0.5 mg by mouth at bedtime as needed.        Marland Kitchen aspirin 81 MG tablet Take 81 mg by mouth daily.        . DULoxetine (CYMBALTA) 60 MG capsule Take 60 mg by mouth daily.        Marland Kitchen gabapentin (NEURONTIN) 300 MG capsule Take 300 mg by mouth 3 (three) times daily.        Marland Kitchen HYDROcodone-acetaminophen (VICODIN) 5-500 MG per tablet Take 1 tablet by mouth every 6 (six) hours as needed.        Marland Kitchen lisinopril-hydrochlorothiazide (PRINZIDE,ZESTORETIC) 20-12.5 MG per tablet Take 1 tablet by mouth daily.        Marland Kitchen LORazepam (ATIVAN) 0.5 MG tablet Take 0.5 mg by mouth every 8 (eight) hours.        . methimazole (TAPAZOLE) 10 MG tablet Take 10 mg by mouth 1 day or 1 dose.       . Tamsulosin HCl (FLOMAX) 0.4 MG CAPS Take 0.4 mg by mouth daily.  Allergies as of 05/01/2011 - Review Complete 05/01/2011  Allergen Reaction Noted  . Morphine and related Nausea Only 05/01/2011   Family History:There is no known family history of colorectal carcinoma , liver disease, or inflammatory bowel disease.  Problem Relation Age of Onset  . Colon cancer Neg Hx   . Colon polyps Neg Hx   . Cervical cancer Mother   . Lung cancer Sister   . Breast cancer Sister   . Heart failure Father    History   Social History  . Marital Status: Widowed    Spouse Name: N/A    Number of Children: 1  . Years of Education: N/A   Occupational History  . unemployed    Social History Main Topics  . Smoking status: Current Everyday Smoker -- 1.0 packs/day for 45 years    Types: Cigarettes  . Smokeless tobacco: Never Used  . Alcohol Use: Yes     couple beers/shots of Ree Kida or Lynwood Dawley couple times per month; heavy etoh x couple yrs 40y ago  . Drug Use: Yes     occ marijuana,   . Sexually Active: Yes  -- Male partner(s)     hx multiple sexual partners   Other Topics Concern  . Not on file   Social History Narrative   1 daughter committed suicide 2008  Review of Systems: See HPI, otherwise negative 12-point ROS  Physical Exam: BP 124/85  Pulse 82  Temp(Src) 97.4 F (36.3 C) (Temporal)  Ht 5\' 8"  (1.727 m)  Wt 141 lb 9.6 oz (64.229 kg)  BMI 21.53 kg/m2 General:   Alert,  Well-developed, thin, pleasant and cooperative in NAD.  Accompanied by wife. Head:  Normocephalic and atraumatic. Eyes:  Sclera clear, no icterus.   Conjunctiva pink. Ears:  Normal auditory acuity. Nose:  No deformity, discharge,  or lesions. Mouth:  No deformity or lesions, OP pink/moist. Neck:  Supple; no masses or thyromegaly. Lungs:  Clear throughout to auscultation.   No wheezes, crackles, or rhonchi. No acute distress. Heart:  Regular rate and rhythm; no murmurs, clicks, rubs,  or gallops. Abdomen:  Soft, nontender and nondistended. No masses, hepatosplenomegaly or hernias noted. Normal bowel sounds, without guarding, and without rebound.   Rectal:  Deferred until time of colonoscopy.   Msk:  Symmetrical without gross deformities. Normal posture. Pulses:  Normal pulses noted. Extremities:  With clubbing.  No edema. Neurologic:  Alert and  oriented x4;  grossly normal neurologically. Skin:  Intact without significant lesions or rashes. Cervical Nodes:  No significant cervical adenopathy. Psych:  Alert and cooperative. Normal mood and affect.

## 2011-05-01 NOTE — Assessment & Plan Note (Signed)
May be related to hyperthyroidism. Will need screening colonoscopy & EGD to screen for varices at a later date. Recommend future Hepatitis A/B vaccines if not immune.

## 2011-05-01 NOTE — Assessment & Plan Note (Addendum)
HCV quantitative RNA  See elevated LFTs

## 2011-05-01 NOTE — Patient Instructions (Signed)
Get your lab work today Do not drink alcohol! Limit your use of Tylenol or acetaminophen-containing products to 2000 mg OR 2 g daily. Also limit your Xanax and Ativan given your liver disease. 1-800-QUIT-NOW to quit smoking You will need a colonoscopy and upper endoscopy in the near future. We will call with lab results and further recommendations.

## 2011-05-01 NOTE — Assessment & Plan Note (Addendum)
Eric Tran is a 60 y.o. male referred for elevated liver function tests.  He gives hx of chronic hepatitis C & has never had treatment.  His ultrasound shows 7mm bile duct, cirrhosis & cholelithiasis.  He denies any abdominal pain.  He does have constitutional symptoms of malaise, fatigue, anorexia, & weight loss.  Most likely HCV cirrhosis, but that does not explain acute elevation of LFTs.  He also consumes a considerable amt of acetaminophen in his narcotic regimen & occasional alcohol.  His thyroid issues could be complicating the picture as well.  Recheck LFTS.  Will need MRCP if elevated to r/o choledocholithiasis/obstruction/mass. CBC, INR, acute hep panel, TSH, AFP Do not drink alcohol! Limit your use of Tylenol or acetaminophen-containing products to 2000 mg OR 2 g daily. Also limit your Xanax and Ativan given your liver disease. 1-800-QUIT-NOW to quit smoking You will need a colonoscopy and upper endoscopy in the near future. We will call with lab results and further recommendations.

## 2011-05-02 LAB — CBC WITH DIFFERENTIAL/PLATELET
Basophils Absolute: 0.1 10*3/uL (ref 0.0–0.1)
Basophils Relative: 1 % (ref 0–1)
Eosinophils Absolute: 0.1 10*3/uL (ref 0.0–0.7)
Eosinophils Relative: 2 % (ref 0–5)
HCT: 43.6 % (ref 39.0–52.0)
Hemoglobin: 14.9 g/dL (ref 13.0–17.0)
Lymphocytes Relative: 46 % (ref 12–46)
Lymphs Abs: 2.4 10*3/uL (ref 0.7–4.0)
MCH: 32.4 pg (ref 26.0–34.0)
MCHC: 34.2 g/dL (ref 30.0–36.0)
MCV: 94.8 fL (ref 78.0–100.0)
Monocytes Absolute: 0.7 10*3/uL (ref 0.1–1.0)
Monocytes Relative: 13 % — ABNORMAL HIGH (ref 3–12)
Neutro Abs: 1.9 10*3/uL (ref 1.7–7.7)
Neutrophils Relative %: 37 % — ABNORMAL LOW (ref 43–77)
Platelets: 342 10*3/uL (ref 150–400)
RBC: 4.6 MIL/uL (ref 4.22–5.81)
RDW: 14.7 % (ref 11.5–15.5)
WBC: 5.2 10*3/uL (ref 4.0–10.5)

## 2011-05-02 LAB — PROTIME-INR
INR: 0.92 (ref <1.50)
Prothrombin Time: 12.6 seconds (ref 11.6–15.2)

## 2011-05-02 LAB — HEPATIC FUNCTION PANEL
ALT: 164 U/L — ABNORMAL HIGH (ref 0–53)
AST: 106 U/L — ABNORMAL HIGH (ref 0–37)
Albumin: 3.7 g/dL (ref 3.5–5.2)
Alkaline Phosphatase: 287 U/L — ABNORMAL HIGH (ref 39–117)
Bilirubin, Direct: 1 mg/dL — ABNORMAL HIGH (ref 0.0–0.3)
Indirect Bilirubin: 1 mg/dL — ABNORMAL HIGH (ref 0.0–0.9)
Total Bilirubin: 2 mg/dL — ABNORMAL HIGH (ref 0.3–1.2)
Total Protein: 7.4 g/dL (ref 6.0–8.3)

## 2011-05-02 LAB — TSH: TSH: 0.27 u[IU]/mL — ABNORMAL LOW (ref 0.350–4.500)

## 2011-05-02 LAB — ACETAMINOPHEN LEVEL: Acetaminophen (Tylenol), Serum: 16 ug/mL (ref 10–30)

## 2011-05-03 LAB — AFP TUMOR MARKER: AFP-Tumor Marker: 4.1 ng/mL (ref 0.0–8.0)

## 2011-05-04 LAB — HEPATITIS PANEL, ACUTE
HCV Ab: REACTIVE — AB
Hep A IgM: NEGATIVE
Hep B C IgM: NEGATIVE
Hepatitis B Surface Ag: NEGATIVE

## 2011-05-05 NOTE — Progress Notes (Signed)
Quick Note:  TSH abnl. Await HCV RNA. Will need MRCP. ______

## 2011-05-06 ENCOUNTER — Telehealth: Payer: Self-pay | Admitting: Urgent Care

## 2011-05-06 ENCOUNTER — Other Ambulatory Visit: Payer: Self-pay | Admitting: Internal Medicine

## 2011-05-06 DIAGNOSIS — R748 Abnormal levels of other serum enzymes: Secondary | ICD-10-CM

## 2011-05-06 LAB — HEPATITIS C RNA QUANTITATIVE
HCV Quantitative Log: 5.23 {Log} — ABNORMAL HIGH (ref <1.63)
HCV Quantitative: 171000 IU/mL — ABNORMAL HIGH (ref <43)

## 2011-05-06 NOTE — Progress Notes (Signed)
Results Cc to PCP  

## 2011-05-06 NOTE — Telephone Encounter (Signed)
Pt is scheduled for MRCP on 05/09/11 @ 10:00- he is aware and referral faxed to [facility] in [place]

## 2011-05-06 NOTE — Progress Notes (Signed)
Quick Note:  See phone note ______ 

## 2011-05-06 NOTE — Telephone Encounter (Signed)
Pt is aware of OV on 11/19 at 2pm with KJ and appt card was mailed

## 2011-05-06 NOTE — Progress Notes (Signed)
Quick Note:  Cc: MUSE,ROCHELLE D., PA, PA-C All labs ______

## 2011-05-06 NOTE — Telephone Encounter (Signed)
Discussed all lab results w/ pt. Discussed plan of care below: Pt agrees. 1) Needs MRCP ZO:[id number] LFTs/bilirubin/HCV/cirrhosis 2) Safe sex precautions 3) recommended sexual partners go to health dept to be checked for HCV 4) FU MUSE,ROCHELLE D., PA, PA-C regarding thyroid issues 5) Referral to [facility] re: HCV/cirrhosis 6) Will need screening EGD/TCS in near future. Please arrange OV w/ me after MRI to discuss results & set up. 7) Needs Hep A/B vaccines through health dept Thanks

## 2011-05-09 ENCOUNTER — Ambulatory Visit (HOSPITAL_COMMUNITY)
Admission: RE | Admit: 2011-05-09 | Discharge: 2011-05-09 | Disposition: A | Discharge status: Home / Self Care | Payer: Self-pay | Source: Ambulatory Visit | Attending: Internal Medicine | Admitting: Internal Medicine

## 2011-05-09 ENCOUNTER — Other Ambulatory Visit: Payer: Self-pay | Admitting: Internal Medicine

## 2011-05-09 ENCOUNTER — Other Ambulatory Visit: Payer: Self-pay | Admitting: Gastroenterology

## 2011-05-09 DIAGNOSIS — R748 Abnormal levels of other serum enzymes: Secondary | ICD-10-CM

## 2011-05-09 DIAGNOSIS — R932 Abnormal findings on diagnostic imaging of liver and biliary tract: Secondary | ICD-10-CM | POA: Insufficient documentation

## 2011-05-09 MED ORDER — GADOBENATE DIMEGLUMINE 529 MG/ML IV SOLN
14.0000 mL | Freq: Once | INTRAVENOUS | Status: AC | PRN
  Administered 2011-05-09: 14 mL via INTRAVENOUS

## 2011-05-12 ENCOUNTER — Telehealth: Payer: Self-pay | Admitting: Urgent Care

## 2011-05-12 NOTE — Telephone Encounter (Signed)
Patient given results of MRCP. He will need EGD and colonoscopy

## 2011-05-12 NOTE — Telephone Encounter (Signed)
Please arrange an outpatient EGD and colonoscopy  RE.: Weight loss in the OR Procedure will need to be done with deep sedation (propofol) in the OR under the direction of anesthesia services for history chronic narcotic use/polypharmacy I have discussed risks & benefits which include, but are not limited to, bleeding, infection, perforation & drug reaction.  The patient agrees with this plan & written consent will be obtained.   He will need referral to [facility] liver clinic in [place] for chronic hepatitis C after procedures. He plans on getting hepatitis A and B. vaccines via the health department Agrees with this plan Thanks

## 2011-05-13 ENCOUNTER — Other Ambulatory Visit: Payer: Self-pay | Admitting: Gastroenterology

## 2011-05-13 DIAGNOSIS — R634 Abnormal weight loss: Secondary | ICD-10-CM

## 2011-05-13 NOTE — Telephone Encounter (Signed)
Pt is scheduled for 06/12/11 in the OR- spoke w/ pts wife April - instructions mailed

## 2011-05-19 ENCOUNTER — Ambulatory Visit: Payer: Self-pay | Admitting: Urgent Care

## 2011-05-19 ENCOUNTER — Telehealth: Payer: Self-pay | Admitting: Urgent Care

## 2011-05-19 NOTE — Telephone Encounter (Signed)
Pt was a no show

## 2011-05-19 NOTE — Telephone Encounter (Signed)
Noted  

## 2011-06-03 ENCOUNTER — Telehealth: Payer: Self-pay | Admitting: Gastroenterology

## 2011-06-03 NOTE — Telephone Encounter (Signed)
Pt was seen by you on 05/01/11- could not get pt scheduled before 12/13 in the OR due to RMR schedule being full

## 2011-06-03 NOTE — Telephone Encounter (Signed)
Gastroenterology Pre-Procedure Form  Request Date: 06/03/11,  Requesting Physician:      PATIENT INFORMATION:  Eric Tran is a 60 y.o., male (DOB=04-27-1951).  PROCEDURE: Procedure(s) requested: colonoscopy & EGD Procedure Reason: wt loss & screening   PATIENT REVIEW QUESTIONS: The patient reports the following:   1. Diabetes Melitis: no 2. Joint replacements in the past 12 months: no 3. Major health problems in the past 3 months: yes hyperthyroidism  4. Has an artificial valve or MVP:no 5. Has been advised in past to take antibiotics in advance of a procedure like teeth cleaning: no}    MEDICATIONS & ALLERGIES:    Patient reports the following regarding taking any blood thinners:   Plavix? no Aspirin?yes 81mg   Coumadin?  no  Patient confirms/reports the following medications:  Current Outpatient Prescriptions  Medication Sig Dispense Refill  . acetaminophen-codeine (TYLENOL #3) 300-30 MG per tablet Take 1 tablet by mouth every 4 (four) hours as needed.        . ALPRAZolam (XANAX) 0.5 MG tablet Take 0.5 mg by mouth at bedtime as needed.        Marland Kitchen aspirin 81 MG tablet Take 81 mg by mouth daily.        . DULoxetine (CYMBALTA) 60 MG capsule Take 60 mg by mouth daily.        Marland Kitchen gabapentin (NEURONTIN) 300 MG capsule Take 300 mg by mouth 3 (three) times daily.        Marland Kitchen HYDROcodone-acetaminophen (VICODIN) 5-500 MG per tablet Take 1 tablet by mouth every 6 (six) hours as needed.        Marland Kitchen lisinopril-hydrochlorothiazide (PRINZIDE,ZESTORETIC) 20-12.5 MG per tablet Take 1 tablet by mouth daily.        Marland Kitchen LORazepam (ATIVAN) 0.5 MG tablet Take 0.5 mg by mouth every 8 (eight) hours.        . methimazole (TAPAZOLE) 10 MG tablet Take 10 mg by mouth 1 day or 1 dose.       . Tamsulosin HCl (FLOMAX) 0.4 MG CAPS Take 0.4 mg by mouth daily.          Patient confirms/reports the following allergies:  Allergies  Allergen Reactions  . Morphine And Related Nausea Only    Patient is appropriate to  schedule for requested procedure(s): yes   No orders of the defined types were placed in this encounter.    SCHEDULE INFORMATION: Procedure has been scheduled as follows:  Date: 06/12/11, Time:   Location: [facility]   This Gastroenterology Pre-Precedure Form is being routed to the following provider(s) for review: R. Roetta Sessions, MD

## 2011-06-03 NOTE — Telephone Encounter (Signed)
OK to proceed with colonoscopy  In OR with propofol as planned

## 2011-06-03 NOTE — Telephone Encounter (Signed)
Patient needs office visit for multiple psychoactive meds prior to colonoscopy

## 2011-06-06 ENCOUNTER — Encounter (HOSPITAL_COMMUNITY): Payer: Self-pay

## 2011-06-06 ENCOUNTER — Encounter (HOSPITAL_COMMUNITY)
Admission: RE | Admit: 2011-06-06 | Discharge: 2011-06-06 | Payer: Non-veteran care | Source: Ambulatory Visit | Attending: Internal Medicine | Admitting: Internal Medicine

## 2011-06-06 NOTE — Patient Instructions (Addendum)
20 Eric Tran  06/06/2011   Your procedure is scheduled on:  06/12/2011  Report to [facility] at  1010  AM.  Call this number if you have problems the morning of surgery: [phone]   Remember:   Do not eat food:After Midnight.  May have clear liquids:until Midnight .  Clear liquids include soda, tea, black coffee, apple or grape juice, broth.  Take these medicines the morning of surgery with A SIP OF WATER: Xanax, cymbalta,neurontin,vicodin,lisinopril,ativan,methamazole   Do not wear jewelry, make-up or nail polish.  Do not wear lotions, powders, or perfumes. You may wear deodorant.  Do not shave 48 hours prior to surgery.  Do not bring valuables to the hospital.  Contacts, dentures or bridgework may not be worn into surgery.  Leave suitcase in the car. After surgery it may be brought to your room.  For patients admitted to the hospital, checkout time is 11:00 AM the day of discharge.   Patients discharged the day of surgery will not be allowed to drive home.  Name and phone number of your driver: family  Special Instructions: N/A   Please read over the following fact sheets that you were given: Pain Booklet, Surgical Site Infection Prevention, Anesthesia Post-op Instructions and Care and Recovery After Surgery Colonoscopy A colonoscopy is an exam to evaluate your entire colon. In this exam, your colon is cleansed. A long fiberoptic tube is inserted through your rectum and into your colon. The fiberoptic scope (endoscope) is a long bundle of enclosed and very flexible fibers. These fibers transmit light to the area examined and send images from that area to your caregiver. Discomfort is usually minimal. You may be given a drug to help you sleep (sedative) during or prior to the procedure. This exam helps to detect lumps (tumors), polyps, inflammation, and areas of bleeding. Your caregiver may also take a small piece of tissue (biopsy) that will be examined under a microscope. LET YOUR  CAREGIVER KNOW ABOUT:   Allergies to food or medicine.   Medicines taken, including vitamins, herbs, eyedrops, over-the-counter medicines, and creams.   Use of steroids (by mouth or creams).   Previous problems with anesthetics or numbing medicines.   History of bleeding problems or blood clots.   Previous surgery.   Other health problems, including diabetes and kidney problems.   Possibility of pregnancy, if this applies.  BEFORE THE PROCEDURE   A clear liquid diet may be required for 2 days before the exam.   Ask your caregiver about changing or stopping your regular medications.   Liquid injections (enemas) or laxatives may be required.   A large amount of electrolyte solution may be given to you to drink over a short period of time. This solution is used to clean out your colon.   You should be present 60 minutes prior to your procedure or as directed by your caregiver.  AFTER THE PROCEDURE   If you received a sedative or pain relieving medication, you will need to arrange for someone to drive you home.   Occasionally, there is a little blood passed with the first bowel movement. Do not be concerned.  FINDING OUT THE RESULTS OF YOUR TEST Not all test results are available during your visit. If your test results are not back during the visit, make an appointment with your caregiver to find out the results. Do not assume everything is normal if you have not heard from your caregiver or the medical facility. It is important  for you to follow up on all of your test results. HOME CARE INSTRUCTIONS   It is not unusual to pass moderate amounts of gas and experience mild abdominal cramping following the procedure. This is due to air being used to inflate your colon during the exam. Walking or a warm pack on your belly (abdomen) may help.   You may resume all normal meals and activities after sedatives and medicines have worn off.   Only take over-the-counter or prescription  medicines for pain, discomfort, or fever as directed by your caregiver. Do not use aspirin or blood thinners if a biopsy was taken. Consult your caregiver for medicine usage if biopsies were taken.  SEEK IMMEDIATE MEDICAL CARE IF:   You have a fever.   You pass large blood clots or fill a toilet with blood following the procedure. This may also occur 10 to 14 days following the procedure. This is more likely if a biopsy was taken.   You develop abdominal pain that keeps getting worse and cannot be relieved with medicine.  Document Released: 06/13/2000 Document Revised: 02/26/2011 Document Reviewed: 01/27/2008 [facility] Patient Information 2012 [place], [place].PATIENT INSTRUCTIONS POST-ANESTHESIA  IMMEDIATELY FOLLOWING SURGERY:  Do not drive or operate machinery for the first twenty four hours after surgery.  Do not make any important decisions for twenty four hours after surgery or while taking narcotic pain medications or sedatives.  If you develop intractable nausea and vomiting or a severe headache please notify your doctor immediately.  FOLLOW-UP:  Please make an appointment with your surgeon as instructed. You do not need to follow up with anesthesia unless specifically instructed to do so.  WOUND CARE INSTRUCTIONS (if applicable):  Keep a dry clean dressing on the anesthesia/puncture wound site if there is drainage.  Once the wound has quit draining you may leave it open to air.  Generally you should leave the bandage intact for twenty four hours unless there is drainage.  If the epidural site drains for more than 36-48 hours please call the anesthesia department.  QUESTIONS?:  Please feel free to call your physician or the hospital operator if you have any questions, and they will be happy to assist you.     [facility] Anesthesia Department [address] [place] [phone]

## 2011-06-09 ENCOUNTER — Encounter (HOSPITAL_COMMUNITY): Payer: Non-veteran care

## 2011-06-10 ENCOUNTER — Inpatient Hospital Stay (HOSPITAL_COMMUNITY): Admission: RE | Admit: 2011-06-10 | Payer: Self-pay | Source: Ambulatory Visit

## 2011-06-10 ENCOUNTER — Telehealth: Payer: Self-pay | Admitting: Gastroenterology

## 2011-06-10 NOTE — Telephone Encounter (Signed)
Received call from Eric Tran in Endo- pt was a no show for pre op- when they called him he told them he had a migraine and would not be able to come- I called pt to reschedule pre op and offered him 12:45 or 1:45 on 06/11/11- he stated he needs to check his schedule and will call me back

## 2011-06-11 ENCOUNTER — Telehealth: Payer: Self-pay | Admitting: Gastroenterology

## 2011-06-11 NOTE — Telephone Encounter (Signed)
Pt has not returned my call regarding his pre op- spoke w/ KJ ok to cancel procedure- Endo notified

## 2011-06-11 NOTE — Telephone Encounter (Signed)
Tried to call pt regarding his pre op appt- no answer- LMOM

## 2011-06-12 ENCOUNTER — Ambulatory Visit (HOSPITAL_COMMUNITY): Admission: RE | Admit: 2011-06-12 | Payer: Non-veteran care | Source: Ambulatory Visit | Admitting: Internal Medicine

## 2011-06-12 ENCOUNTER — Encounter (HOSPITAL_COMMUNITY): Admission: RE | Payer: Self-pay | Source: Ambulatory Visit

## 2011-06-12 SURGERY — COLONOSCOPY WITH PROPOFOL
Anesthesia: Monitor Anesthesia Care

## 2011-08-24 ENCOUNTER — Encounter (HOSPITAL_COMMUNITY): Payer: Self-pay | Admitting: Emergency Medicine

## 2011-08-24 DIAGNOSIS — R5381 Other malaise: Secondary | ICD-10-CM | POA: Insufficient documentation

## 2011-08-24 DIAGNOSIS — Z7982 Long term (current) use of aspirin: Secondary | ICD-10-CM | POA: Insufficient documentation

## 2011-08-24 DIAGNOSIS — R109 Unspecified abdominal pain: Secondary | ICD-10-CM | POA: Insufficient documentation

## 2011-08-24 DIAGNOSIS — Z8673 Personal history of transient ischemic attack (TIA), and cerebral infarction without residual deficits: Secondary | ICD-10-CM | POA: Insufficient documentation

## 2011-08-24 DIAGNOSIS — E0590 Thyrotoxicosis, unspecified without thyrotoxic crisis or storm: Secondary | ICD-10-CM | POA: Insufficient documentation

## 2011-08-24 DIAGNOSIS — Z8619 Personal history of other infectious and parasitic diseases: Secondary | ICD-10-CM | POA: Insufficient documentation

## 2011-08-24 DIAGNOSIS — I10 Essential (primary) hypertension: Secondary | ICD-10-CM | POA: Insufficient documentation

## 2011-08-24 DIAGNOSIS — R350 Frequency of micturition: Secondary | ICD-10-CM | POA: Insufficient documentation

## 2011-08-24 DIAGNOSIS — M549 Dorsalgia, unspecified: Secondary | ICD-10-CM | POA: Insufficient documentation

## 2011-08-24 DIAGNOSIS — M129 Arthropathy, unspecified: Secondary | ICD-10-CM | POA: Insufficient documentation

## 2011-08-24 DIAGNOSIS — R509 Fever, unspecified: Secondary | ICD-10-CM | POA: Insufficient documentation

## 2011-08-24 DIAGNOSIS — R319 Hematuria, unspecified: Secondary | ICD-10-CM | POA: Insufficient documentation

## 2011-08-24 DIAGNOSIS — F341 Dysthymic disorder: Secondary | ICD-10-CM | POA: Insufficient documentation

## 2011-08-24 DIAGNOSIS — N12 Tubulo-interstitial nephritis, not specified as acute or chronic: Secondary | ICD-10-CM | POA: Insufficient documentation

## 2011-08-24 DIAGNOSIS — G8929 Other chronic pain: Secondary | ICD-10-CM | POA: Insufficient documentation

## 2011-08-24 DIAGNOSIS — R110 Nausea: Secondary | ICD-10-CM | POA: Insufficient documentation

## 2011-08-24 DIAGNOSIS — M542 Cervicalgia: Secondary | ICD-10-CM | POA: Insufficient documentation

## 2011-08-24 DIAGNOSIS — M62838 Other muscle spasm: Secondary | ICD-10-CM | POA: Insufficient documentation

## 2011-08-24 DIAGNOSIS — Z79899 Other long term (current) drug therapy: Secondary | ICD-10-CM | POA: Insufficient documentation

## 2011-08-24 DIAGNOSIS — K7460 Unspecified cirrhosis of liver: Secondary | ICD-10-CM | POA: Insufficient documentation

## 2011-08-24 LAB — URINALYSIS, ROUTINE W REFLEX MICROSCOPIC
Glucose, UA: NEGATIVE mg/dL
Nitrite: POSITIVE — AB
Protein, ur: >300 mg/dL — AB
Specific Gravity, Urine: 1.025 (ref 1.005–1.030)
Urobilinogen, UA: 2 mg/dL — ABNORMAL HIGH (ref 0.0–1.0)
pH: 6.5 (ref 5.0–8.0)

## 2011-08-24 LAB — URINE MICROSCOPIC-ADD ON

## 2011-08-24 NOTE — ED Notes (Signed)
Patient reports neck pain that radiates into shoulder. States it started 7-8 days ago. Tender to touch. States about 3 days ago he started experiencing urinary urgency, started running a fever today and has blood in urine.

## 2011-08-24 NOTE — ED Notes (Signed)
No answer when called to exam room. 

## 2011-08-25 ENCOUNTER — Emergency Department (HOSPITAL_COMMUNITY)
Admission: EM | Admit: 2011-08-25 | Discharge: 2011-08-25 | Disposition: A | Discharge status: Home / Self Care | Payer: Medicaid Other | Attending: Emergency Medicine | Admitting: Emergency Medicine

## 2011-08-25 DIAGNOSIS — N12 Tubulo-interstitial nephritis, not specified as acute or chronic: Secondary | ICD-10-CM

## 2011-08-25 DIAGNOSIS — M62838 Other muscle spasm: Secondary | ICD-10-CM

## 2011-08-25 LAB — DIFFERENTIAL
Basophils Absolute: 0 10*3/uL (ref 0.0–0.1)
Basophils Relative: 0 % (ref 0–1)
Eosinophils Absolute: 0 10*3/uL (ref 0.0–0.7)
Eosinophils Relative: 0 % (ref 0–5)
Lymphocytes Relative: 15 % (ref 12–46)
Lymphs Abs: 2.2 10*3/uL (ref 0.7–4.0)
Monocytes Absolute: 2.2 10*3/uL — ABNORMAL HIGH (ref 0.1–1.0)
Monocytes Relative: 15 % — ABNORMAL HIGH (ref 3–12)
Neutro Abs: 10.1 10*3/uL — ABNORMAL HIGH (ref 1.7–7.7)
Neutrophils Relative %: 70 % (ref 43–77)

## 2011-08-25 LAB — CBC
HCT: 42.5 % (ref 39.0–52.0)
Hemoglobin: 15.4 g/dL (ref 13.0–17.0)
MCH: 33.1 pg (ref 26.0–34.0)
MCHC: 36.2 g/dL — ABNORMAL HIGH (ref 30.0–36.0)
MCV: 91.4 fL (ref 78.0–100.0)
Platelets: 218 10*3/uL (ref 150–400)
RBC: 4.65 MIL/uL (ref 4.22–5.81)
RDW: 13.1 % (ref 11.5–15.5)
WBC: 14.6 10*3/uL — ABNORMAL HIGH (ref 4.0–10.5)

## 2011-08-25 LAB — BASIC METABOLIC PANEL
BUN: 15 mg/dL (ref 6–23)
CO2: 27 mEq/L (ref 19–32)
Calcium: 9.3 mg/dL (ref 8.4–10.5)
Chloride: 92 mEq/L — ABNORMAL LOW (ref 96–112)
Creatinine, Ser: 0.59 mg/dL (ref 0.50–1.35)
GFR calc Af Amer: >90 mL/min (ref >90)
GFR calc non Af Amer: >90 mL/min (ref >90)
Glucose, Bld: 105 mg/dL — ABNORMAL HIGH (ref 70–99)
Potassium: 3.7 mEq/L (ref 3.5–5.1)
Sodium: 132 mEq/L — ABNORMAL LOW (ref 135–145)

## 2011-08-25 MED ORDER — HYDROMORPHONE HCL PF 1 MG/ML IJ SOLN
1.0000 mg | Freq: Once | INTRAMUSCULAR | Status: AC
  Administered 2011-08-25: 1 mg via INTRAVENOUS
  Filled 2011-08-25: qty 1

## 2011-08-25 MED ORDER — SODIUM CHLORIDE 0.9 % IV BOLUS (SEPSIS)
1000.0000 mL | Freq: Once | INTRAVENOUS | Status: AC
  Administered 2011-08-25: 1000 mL via INTRAVENOUS

## 2011-08-25 MED ORDER — CIPROFLOXACIN HCL 500 MG PO TABS: 500.0000 mg | ORAL_TABLET | Freq: Two times a day (BID) | ORAL | Status: AC

## 2011-08-25 MED ORDER — CIPROFLOXACIN IN D5W 400 MG/200ML IV SOLN
400.0000 mg | Freq: Once | INTRAVENOUS | Status: AC
  Administered 2011-08-25: 400 mg via INTRAVENOUS
  Filled 2011-08-25: qty 200

## 2011-08-25 MED ORDER — CYCLOBENZAPRINE HCL 10 MG PO TABS: 10.0000 mg | ORAL_TABLET | Freq: Two times a day (BID) | ORAL | Status: AC | PRN

## 2011-08-25 MED ORDER — OXYCODONE-ACETAMINOPHEN 5-325 MG PO TABS: 1.0000 | ORAL_TABLET | ORAL | Status: AC | PRN

## 2011-08-25 MED ORDER — PROMETHAZINE HCL 25 MG PO TABS: 25.0000 mg | ORAL_TABLET | Freq: Four times a day (QID) | ORAL | Status: AC | PRN

## 2011-08-25 MED ORDER — OXYCODONE-ACETAMINOPHEN 5-325 MG PO TABS
ORAL_TABLET | ORAL | Status: AC
  Administered 2011-08-25: 1
  Filled 2011-08-25: qty 2

## 2011-08-25 NOTE — Discharge Instructions (Signed)
Your blood work showed an elevated blood count consistent with a kidney infection. Please take the medication called ciprofloxacin twice a day for 10 days. If your urine culture results show that you need a different antibiotic, you will receive a phone call and a new prescription. Please return to your doctor within the next 2 days for a recheck if possible, he may return to the emergency department if you cannot be seen by your doctor.   RESOURCE GUIDE  Dental Problems  Patients with Medicaid: [facility] [phone] [address]                                           [phone] [address] Phone:  [phone]                                                  Phone:  [phone]  If unable to pay or uninsured, contact:  [facility] or [facility] to become qualified for the adult dental clinic.  Chronic Pain Problems Contact [facility]  [phone] Patients need to be referred by their primary care doctor.  Insufficient Money for Medicine Contact [employer] Way:  call "211" or [facility] [phone].  No Primary Care Doctor Call [facility]  [phone] Other agencies that provide inexpensive medical care    [facility]  [phone]    [facility]  [phone]    [facility]  [phone]    [facility]  [phone]    [facility]  [phone]    [facility]  [phone]  Psychological Services [facility]  [phone] [facility]  [phone] [facility]   [phone] (emergency services [phone])  Substance Abuse Resources [facility]  [phone] [facility] [phone] [facility] [place] [phone] Floydene Flock [phone] Residential & Outpatient Substance Abuse Program  [phone]  Abuse/Neglect [facility] Child Abuse Hotline 628-415-1192 [facility] Child Abuse  Hotline [phone] (After Hours)  Emergency Shelter [facility] 6153230054  Maternity Homes Room at the [facility] [phone] [facility] 7150660100  MRSA Hotline #:   818-655-9263    [facility] Resources  [facility]     [employer]                          [facility] Dept. [address]                        [address]      [address] [place] [address]  Patrecia Pace  [facility] Phone:  [phone]                                   Phone:  [phone]                 Phone:  [place] Phone:  [place] [phone] (After Hours)

## 2011-08-25 NOTE — ED Provider Notes (Signed)
History     CSN: [id number]  Arrival date & time 08/24/11  2026   First MD Initiated Contact with Patient 08/25/11 0037      Chief Complaint  Patient presents with  . Neck Pain  . Hematuria  . Fever  . Back Pain    (Consider location/radiation/quality/duration/timing/severity/associated sxs/prior treatment) HPI Comments: 61 year old male with a history of hyperthyroidism, chronic low back pain, anxiety and history of stroke. He presents because she's been having 2 complaints  #1 neck pain - he states that he has been having pain in his bilateral trapezius muscles that started approximately 8 days ago. This is tender to palpation specifically on the left, constant, worse with movement of the shoulder and not associated with chest pain, shortness of breath or numbness weakness or tingling of the arm.  #2 - hematuria - patient states that over the last 3 days he has had intermittent hematuria, frequent urination with incomplete emptying of the bladder. He admits to dysuria and endorses left flank pain. He has been nauseated, fatigue and nothing makes this better or worse. Symptoms seem to be gradually getting worse. He denies history of urinary tract infections in the past.  The patient is currently being followed by a clinic in [place] for his hyperthyroidism and is currently taking methimazole for the same  Patient is a 61 y.o. male presenting with neck pain, hematuria, fever, and back pain. The history is provided by the patient and the spouse.  Neck Pain   Hematuria Associated symptoms include fever.  Fever Primary symptoms of the febrile illness include fever.  Back Pain  Associated symptoms include a fever.    Past Medical History  Diagnosis Date  . Hyperthyroidism   . Chronic back pain     chronic narcotic use  . Degenerative disc disease   . Carpal tunnel syndrome   . Arthritis   . Hypertension   . Chronic hepatitis C 1980    never had any treatment  .  Cirrhosis   . CVA (cerebral infarction) 06/2010  . Anxiety   . Depression     Past Surgical History  Procedure Date  . Back surgery     C4-C5 fusion  . Hand surgery     right  . Neck surgery   . Right hand reconstruction     Family History  Problem Relation Age of Onset  . Colon cancer Neg Hx   . Colon polyps Neg Hx   . Cervical cancer Mother   . Lung cancer Sister   . Breast cancer Sister   . Heart failure Father     History  Substance Use Topics  . Smoking status: Current Everyday Smoker -- 1.0 packs/day for 45 years    Types: Cigarettes  . Smokeless tobacco: Never Used  . Alcohol Use: Yes     couple beers/shots of Ree Kida or Lynwood Dawley couple times per month; heavy etoh x couple yrs 40y ago      Review of Systems  Constitutional: Positive for fever.  HENT: Positive for neck pain.   Genitourinary: Positive for hematuria.  Musculoskeletal: Positive for back pain.  All other systems reviewed and are negative.    Allergies  Morphine and related  Home Medications   Current Outpatient Rx  Name Route Sig Dispense Refill  . ALPRAZOLAM 0.5 MG PO TABS Oral Take 0.5 mg by mouth at bedtime as needed. For sleep and anxiety    . ASPIRIN 81 MG PO TABS Oral Take 81 mg  by mouth daily.     Marland Kitchen CIPROFLOXACIN HCL 500 MG PO TABS Oral Take 1 tablet (500 mg total) by mouth every 12 (twelve) hours. 20 tablet 0  . CYCLOBENZAPRINE HCL 10 MG PO TABS Oral Take 1 tablet (10 mg total) by mouth 2 (two) times daily as needed for muscle spasms. 20 tablet 0  . DULOXETINE HCL 60 MG PO CPEP Oral Take 60 mg by mouth daily.      Marland Kitchen GABAPENTIN 300 MG PO CAPS Oral Take 300 mg by mouth daily.     Marland Kitchen HYDROCODONE-ACETAMINOPHEN 5-325 MG PO TABS Oral Take 1 tablet by mouth every 6 (six) hours as needed. For pain     . LISINOPRIL-HYDROCHLOROTHIAZIDE 20-12.5 MG PO TABS Oral Take 1 tablet by mouth daily.      Marland Kitchen LORAZEPAM 0.5 MG PO TABS Oral Take 0.5 mg by mouth every 8 (eight) hours as needed. For anxiety    .  METHIMAZOLE 10 MG PO TABS Oral Take 20 mg by mouth every 8 (eight) hours.     . OXYCODONE-ACETAMINOPHEN 5-325 MG PO TABS Oral Take 1 tablet by mouth every 4 (four) hours as needed for pain. May take 2 tablets PO q 6 hours for severe pain - Do not take with Tylenol as this tablet already contains tylenol 15 tablet 0  . PROMETHAZINE HCL 25 MG PO TABS Oral Take 1 tablet (25 mg total) by mouth every 6 (six) hours as needed for nausea. 12 tablet 0  . TAMSULOSIN HCL 0.4 MG PO CAPS Oral Take 0.4 mg by mouth daily.       BP 119/85  Pulse 112  Temp(Src) 98.8 F (37.1 C) (Oral)  Resp 14  Ht 5\' 8"  (1.727 m)  Wt 140 lb (63.504 kg)  BMI 21.29 kg/m2  SpO2 100%  Physical Exam  Nursing note and vitals reviewed. Constitutional: He appears well-developed and well-nourished. No distress.  HENT:  Head: Normocephalic and atraumatic.  Mouth/Throat: Oropharynx is clear and moist. No oropharyngeal exudate.  Eyes: Conjunctivae and EOM are normal. Pupils are equal, round, and reactive to light. Right eye exhibits no discharge. Left eye exhibits no discharge. No scleral icterus.  Neck: Normal range of motion. Neck supple. No JVD present. No thyromegaly present.       Tender to palpation in the left strap muscles and left trapezius muscle, no masses felt  Cardiovascular: Normal rate, regular rhythm, normal heart sounds and intact distal pulses.  Exam reveals no gallop and no friction rub.   No murmur heard. Pulmonary/Chest: Effort normal and breath sounds normal. No respiratory distress. He has no wheezes. He has no rales.  Abdominal: Soft. Bowel sounds are normal. He exhibits no distension and no mass. There is no tenderness.       Left CVA tenderness, no suprapubic tenderness or other abdominal tenderness. Non-peritoneal  Musculoskeletal: Normal range of motion. He exhibits no edema and no tenderness.  Lymphadenopathy:    He has no cervical adenopathy.  Neurological: He is alert. Coordination normal.  Skin:  Skin is warm and dry. No rash noted. No erythema.  Psychiatric: He has a normal mood and affect. His behavior is normal.    ED Course  Procedures (including critical care time)  Labs Reviewed  URINALYSIS, ROUTINE W REFLEX MICROSCOPIC - Abnormal; Notable for the following:    Color, Urine AMBER (*) BIOCHEMICALS MAY BE AFFECTED BY COLOR   APPearance CLOUDY (*)    Hgb urine dipstick LARGE (*)    Bilirubin  Urine MODERATE (*)    Ketones, ur TRACE (*)    Protein, ur >300 (*)    Urobilinogen, UA 2.0 (*)    Nitrite POSITIVE (*)    Leukocytes, UA MODERATE (*)    All other components within normal limits  URINE MICROSCOPIC-ADD ON - Abnormal; Notable for the following:    Bacteria, UA MANY (*)    All other components within normal limits  CBC - Abnormal; Notable for the following:    WBC 14.6 (*)    MCHC 36.2 (*)    All other components within normal limits  DIFFERENTIAL - Abnormal; Notable for the following:    Neutro Abs 10.1 (*)    Monocytes Relative 15 (*)    Monocytes Absolute 2.2 (*)    All other components within normal limits  BASIC METABOLIC PANEL - Abnormal; Notable for the following:    Sodium 132 (*)    Chloride 92 (*)    Glucose, Bld 105 (*)    All other components within normal limits  URINALYSIS, WITH MICROSCOPIC  URINE CULTURE   No results found.   1. Pyelonephritis   2. Muscle spasm       MDM  Patient is fatigued, has left CVA tenderness and urinary symptoms. In conjunction with this urinalysis which shows a urinary tract infection this is likely the source of his generalized symptoms. In addition he does have some muscle spasm and tenderness in the left trapezius muscle which should be treated with anti-spasmodics. We'll also check for low potassium. Labs pending, antibiotics pending, fluids to be given.  ED Course:  Patient appears well, he has had resolution of his pain with IV fluids and ciprofloxacin and hydromorphone. He states that he will followup as  needed, antibiotics will be first for the next 10 days a urine culture has been sent.   Labarotory results reviewed:  UTI present with too numerous to count white blood cells, red blood cells and many bacteria, leukocytosis, mild hyponatremia but normal renal function.   Radiology imaging reviewed:  None   Previous Records reviewed: No pertinent records   Medications given in ED: Hydromorphone, ciprofloxacin, saline bolus  The pt and (or) family members were informed of results and need for close follow up  Consultation: Referred to urology as outpatient due to 2 second urinary infection within 3 months  Disposition:  Home     Discharge Prescriptions include:  Cipro for 10 days Percocet Phenergan Flexeril            Vida Roller, MD 08/25/11 [phone]

## 2011-08-27 LAB — URINE CULTURE
Colony Count: >=100000
Culture  Setup Time: 201302251400

## 2011-08-28 NOTE — ED Notes (Signed)
+   Urine Patient treated with Cipro-sensitive to same-chart appended per protocol MD. 

## 2011-09-11 ENCOUNTER — Ambulatory Visit: Payer: Self-pay | Admitting: Gastroenterology

## 2011-09-30 ENCOUNTER — Encounter (HOSPITAL_COMMUNITY): Payer: Self-pay | Admitting: *Deleted

## 2011-09-30 ENCOUNTER — Emergency Department (HOSPITAL_COMMUNITY)
Admission: EM | Admit: 2011-09-30 | Discharge: 2011-09-30 | Discharge status: Against Medical Advice | Payer: Self-pay | Attending: Emergency Medicine | Admitting: Emergency Medicine

## 2011-09-30 DIAGNOSIS — M79609 Pain in unspecified limb: Secondary | ICD-10-CM | POA: Insufficient documentation

## 2011-09-30 DIAGNOSIS — R0609 Other forms of dyspnea: Secondary | ICD-10-CM | POA: Insufficient documentation

## 2011-09-30 DIAGNOSIS — R0989 Other specified symptoms and signs involving the circulatory and respiratory systems: Secondary | ICD-10-CM | POA: Insufficient documentation

## 2011-09-30 DIAGNOSIS — M546 Pain in thoracic spine: Secondary | ICD-10-CM | POA: Insufficient documentation

## 2011-09-30 HISTORY — DX: Urinary tract infection, site not specified: N39.0

## 2011-09-30 NOTE — ED Notes (Signed)
Pt not found in waiting room; informed by registration that pt said he was leaving and going to [place], [place] to the hospital there.

## 2011-09-30 NOTE — ED Notes (Signed)
C/o left mid back pain x 2 weeks; denies injury; c/o left arm pain x 2 weeks; c/o dypsnea/fatigue with exertion x 2 weeks

## 2011-10-28 ENCOUNTER — Emergency Department (HOSPITAL_COMMUNITY)
Admission: EM | Admit: 2011-10-28 | Discharge: 2011-10-29 | Disposition: A | Discharge status: Home / Self Care | Payer: Self-pay | Attending: Emergency Medicine | Admitting: Emergency Medicine

## 2011-10-28 ENCOUNTER — Encounter (HOSPITAL_COMMUNITY): Payer: Self-pay | Admitting: *Deleted

## 2011-10-28 DIAGNOSIS — M129 Arthropathy, unspecified: Secondary | ICD-10-CM | POA: Insufficient documentation

## 2011-10-28 DIAGNOSIS — R61 Generalized hyperhidrosis: Secondary | ICD-10-CM | POA: Insufficient documentation

## 2011-10-28 DIAGNOSIS — R252 Cramp and spasm: Secondary | ICD-10-CM | POA: Insufficient documentation

## 2011-10-28 DIAGNOSIS — Z8619 Personal history of other infectious and parasitic diseases: Secondary | ICD-10-CM | POA: Insufficient documentation

## 2011-10-28 DIAGNOSIS — Z8673 Personal history of transient ischemic attack (TIA), and cerebral infarction without residual deficits: Secondary | ICD-10-CM | POA: Insufficient documentation

## 2011-10-28 DIAGNOSIS — E0590 Thyrotoxicosis, unspecified without thyrotoxic crisis or storm: Secondary | ICD-10-CM | POA: Insufficient documentation

## 2011-10-28 DIAGNOSIS — G8929 Other chronic pain: Secondary | ICD-10-CM | POA: Insufficient documentation

## 2011-10-28 DIAGNOSIS — Z79899 Other long term (current) drug therapy: Secondary | ICD-10-CM | POA: Insufficient documentation

## 2011-10-28 DIAGNOSIS — M549 Dorsalgia, unspecified: Secondary | ICD-10-CM | POA: Insufficient documentation

## 2011-10-28 DIAGNOSIS — R5381 Other malaise: Secondary | ICD-10-CM | POA: Insufficient documentation

## 2011-10-28 DIAGNOSIS — R29898 Other symptoms and signs involving the musculoskeletal system: Secondary | ICD-10-CM | POA: Insufficient documentation

## 2011-10-28 DIAGNOSIS — I10 Essential (primary) hypertension: Secondary | ICD-10-CM | POA: Insufficient documentation

## 2011-10-28 DIAGNOSIS — M79609 Pain in unspecified limb: Secondary | ICD-10-CM | POA: Insufficient documentation

## 2011-10-28 DIAGNOSIS — F341 Dysthymic disorder: Secondary | ICD-10-CM | POA: Insufficient documentation

## 2011-10-28 MED ORDER — HYDROMORPHONE HCL PF 1 MG/ML IJ SOLN
1.0000 mg | Freq: Once | INTRAMUSCULAR | Status: AC
  Administered 2011-10-28: 1 mg via INTRAVENOUS
  Filled 2011-10-28: qty 1

## 2011-10-28 MED ORDER — SODIUM CHLORIDE 0.9 % IV SOLN
INTRAVENOUS | Status: DC
  Administered 2011-10-28: via INTRAVENOUS

## 2011-10-28 MED ORDER — ONDANSETRON HCL 4 MG/2ML IJ SOLN
4.0000 mg | Freq: Once | INTRAMUSCULAR | Status: AC
  Administered 2011-10-28: 4 mg via INTRAVENOUS
  Filled 2011-10-28: qty 2

## 2011-10-28 NOTE — ED Provider Notes (Signed)
History   This chart was scribed for Sunnie Nielsen, MD by Melba Coon. The patient was seen in room APA07/APA07 and the patient's care was started at 11:20PM.    CSN: [id number]  Arrival date & time 10/28/11  2230   None     Chief Complaint  Patient presents with  . Extremity Pain    (Consider location/radiation/quality/duration/timing/severity/associated sxs/prior treatment) HPI Eric Tran is a 61 y.o. male who presents to the Emergency Department complaining of constant, moderate to severe generalized weakness with associated generalized bilateral upper and lower extremity pain with an onset a week ago pertaining to his hyperthyroidism. Pt was prescribed Methimazole for his hyperthyroidism which pt states dropped his thyroid count to very low levels of hypothyroidism. Pt believes he is on too much medication, but pt is not regularly followed for his hyperthyroidism due to difficulty obtaining a regular provider. Weakness is also present in all limbs that is getting progressively worse. Lower extremity pain present as well; left leg started cramping an hr ago. Pt states that he feels "debilitated". Night sweats present. No hair loss or heat/cold intolerance. No HA, fever, neck pain, sore throat, rash, back pain, CP, SOB, abd pain, n/v/d, dysuria, or extremity edema, weakness, numbness, or tingling. Allergic to morphine. Pt takes potassium supplements for low K.  Pt has a Hx of stroke a year ago; baseline left sided deficits present. No Hx of tuberculosis. No other pertinent medical symptoms. Pt is a current smoker.  Dr that's supposed to be following pt for hyperthyroidism: Dr Jeanice Lim; has also seen Dr Tobi Bastos in [place]   Past Medical History  Diagnosis Date  . Hyperthyroidism   . Chronic back pain     chronic narcotic use  . Degenerative disc disease   . Carpal tunnel syndrome   . Arthritis   . Hypertension   . Chronic hepatitis C 1980    never had any treatment  . CVA  (cerebral infarction) 06/2010  . Anxiety   . Depression   . UTI (lower urinary tract infection)     Past Surgical History  Procedure Date  . Back surgery     C4-C5 fusion  . Hand surgery     right  . Neck surgery   . Right hand reconstruction     Family History  Problem Relation Age of Onset  . Colon cancer Neg Hx   . Colon polyps Neg Hx   . Cervical cancer Mother   . Lung cancer Sister   . Breast cancer Sister   . Heart failure Father     History  Substance Use Topics  . Smoking status: Current Everyday Smoker -- 1.0 packs/day for 45 years    Types: Cigarettes  . Smokeless tobacco: Never Used  . Alcohol Use: Yes     couple beers/shots of Ree Kida or Lynwood Dawley couple times per month; heavy etoh x couple yrs 40y ago      Review of Systems 10 Systems reviewed and all are negative for acute change except as noted in the HPI.   Allergies  Morphine and related  Home Medications   Current Outpatient Rx  Name Route Sig Dispense Refill  . ALPRAZOLAM 0.5 MG PO TABS Oral Take 0.5 mg by mouth 3 (three) times daily as needed. For sleep and anxiety    . ASPIRIN 81 MG PO TABS Oral Take 81 mg by mouth daily.     . BUSPIRONE HCL 5 MG PO TABS Oral Take 5 mg by mouth  3 (three) times daily.    . DULOXETINE HCL 60 MG PO CPEP Oral Take 60 mg by mouth daily.      Marland Kitchen GABAPENTIN 300 MG PO CAPS Oral Take 300 mg by mouth daily.     Marland Kitchen HYDROCODONE-ACETAMINOPHEN 5-325 MG PO TABS Oral Take 1 tablet by mouth every 6 (six) hours as needed. For pain     . LISINOPRIL-HYDROCHLOROTHIAZIDE 20-12.5 MG PO TABS Oral Take 1 tablet by mouth daily.      Marland Kitchen LORAZEPAM 0.5 MG PO TABS Oral Take 0.5 mg by mouth every 8 (eight) hours as needed. For anxiety    . METHIMAZOLE 10 MG PO TABS Oral Take 30 mg by mouth every 8 (eight) hours.     . TAMSULOSIN HCL 0.4 MG PO CAPS Oral Take 0.4 mg by mouth daily.       BP 120/75  Pulse 94  Temp(Src) 97.7 F (36.5 C) (Oral)  Resp 20  Ht 5\' 8"  (1.727 m)  Wt 141 lb (63.957  kg)  BMI 21.44 kg/m2  SpO2 99%  Physical Exam  Nursing note and vitals reviewed. Constitutional: He appears well-developed and well-nourished.       Awake, alert, nontoxic appearance with baseline speech for patient.  HENT:  Head: Normocephalic and atraumatic.  Mouth/Throat: Mucous membranes are dry. No oropharyngeal exudate.  Eyes: EOM are normal. Pupils are equal, round, and reactive to light. Right eye exhibits no discharge. Left eye exhibits no discharge.  Neck: Neck supple. No thyromegaly (no masses) present.  Cardiovascular: Normal rate and regular rhythm.   No murmur heard. Pulmonary/Chest: Effort normal and breath sounds normal. No stridor. No respiratory distress. He has no wheezes. He has no rales. He exhibits no tenderness.  Abdominal: Soft. Bowel sounds are normal. He exhibits no mass. There is no tenderness. There is no rebound.  Musculoskeletal: He exhibits no tenderness.       Baseline ROM, moves extremities with no obvious new focal weakness.  Lymphadenopathy:    He has no cervical adenopathy.  Neurological:       Awake, alert, cooperative and aware of situation; motor strength bilaterally; sensation normal to light touch bilaterally; peripheral visual fields full to confrontation; no facial asymmetry; tongue midline; major cranial nerves appear intact.  Skin: Skin is warm. No rash noted.  Psychiatric: He has a normal mood and affect. His behavior is normal.    ED Course  Procedures (including critical care time)  DIAGNOSTIC STUDIES: Oxygen Saturation is 99% on room air, normal by my interpretation.    COORDINATION OF CARE:  11:30PM - EDMD will order electrolytes w/u, thyroid test, and dilaudid for the pt.  Labs obtained and reviewed. IV Dilaudid pain control.   MDM  PT with h/o hyperthyroidism now complaining of hypothyroid-type complaints on medication. He is being treated by his physician Dr. Jeanice Lim at the [facility] in [place] but has not been able to get  ahold of her recently and is very worried about his TSH ordered and pending. He states understanding that I cannot give the results of his TSH tonight and his Dr. will have to follow up on results. He would very much like to have a TSH drawn.    1:28 AM PT remains symptomatic with intermittent muscle cramps, case discussed as above with DR Orvan Falconer. He recommends total CK and if normal can f/u as outpatient. No bradycardia and has normal labs otherwise. Unremarkable exam. Patient given Valium  2:47 AM on recheck is feeling much better,  total CK in normal range. Patient feels comfortable to be discharged home and followup with his physician at the [facility] Dr. Jeanice Lim for further evaluation and review of TSH that is not available at this time. Serial exams unchanged   I personally performed the services described in this documentation, which was scribed in my presence. The recorded information has been reviewed and considered.  Sunnie Nielsen, MD 10/29/11 [phone]

## 2011-10-28 NOTE — ED Notes (Signed)
Patient states he has multi[ple complaints related to possible uncontrolled thyroid disease

## 2011-10-29 LAB — COMPREHENSIVE METABOLIC PANEL
ALT: 55 U/L — ABNORMAL HIGH (ref 0–53)
AST: 31 U/L (ref 0–37)
Albumin: 3.6 g/dL (ref 3.5–5.2)
Alkaline Phosphatase: 70 U/L (ref 39–117)
BUN: 14 mg/dL (ref 6–23)
CO2: 29 mEq/L (ref 19–32)
Calcium: 9.5 mg/dL (ref 8.4–10.5)
Chloride: 103 mEq/L (ref 96–112)
Creatinine, Ser: 0.67 mg/dL (ref 0.50–1.35)
GFR calc Af Amer: >90 mL/min (ref >90)
GFR calc non Af Amer: >90 mL/min (ref >90)
Glucose, Bld: 100 mg/dL — ABNORMAL HIGH (ref 70–99)
Potassium: 4.1 mEq/L (ref 3.5–5.1)
Sodium: 141 mEq/L (ref 135–145)
Total Bilirubin: 0.7 mg/dL (ref 0.3–1.2)
Total Protein: 7 g/dL (ref 6.0–8.3)

## 2011-10-29 LAB — CBC
HCT: 39.5 % (ref 39.0–52.0)
Hemoglobin: 14.1 g/dL (ref 13.0–17.0)
MCH: 33.4 pg (ref 26.0–34.0)
MCHC: 35.7 g/dL (ref 30.0–36.0)
MCV: 93.6 fL (ref 78.0–100.0)
Platelets: 196 10*3/uL (ref 150–400)
RBC: 4.22 MIL/uL (ref 4.22–5.81)
RDW: 13 % (ref 11.5–15.5)
WBC: 5.9 10*3/uL (ref 4.0–10.5)

## 2011-10-29 LAB — POCT I-STAT, CHEM 8
BUN: 14 mg/dL (ref 6–23)
Calcium, Ion: 1.2 mmol/L (ref 1.12–1.32)
Chloride: 105 mEq/L (ref 96–112)
Creatinine, Ser: 0.8 mg/dL (ref 0.50–1.35)
Glucose, Bld: 95 mg/dL (ref 70–99)
HCT: 41 % (ref 39.0–52.0)
Hemoglobin: 13.9 g/dL (ref 13.0–17.0)
Potassium: 3.6 mEq/L (ref 3.5–5.1)
Sodium: 141 mEq/L (ref 135–145)
TCO2: 26 mmol/L (ref 0–100)

## 2011-10-29 LAB — CK TOTAL AND CKMB (NOT AT ARMC)
CK, MB: 4.1 ng/mL — ABNORMAL HIGH (ref 0.3–4.0)
Relative Index: INVALID (ref 0.0–2.5)
Total CK: 91 U/L (ref 7–232)

## 2011-10-29 LAB — TSH: TSH: 1.172 u[IU]/mL (ref 0.350–4.500)

## 2011-10-29 LAB — MAGNESIUM: Magnesium: 2 mg/dL (ref 1.5–2.5)

## 2011-10-29 MED ORDER — HYDROMORPHONE HCL PF 1 MG/ML IJ SOLN
1.0000 mg | Freq: Once | INTRAMUSCULAR | Status: AC
  Administered 2011-10-29: 1 mg via INTRAVENOUS
  Filled 2011-10-29: qty 1

## 2011-10-29 MED ORDER — OXYCODONE-ACETAMINOPHEN 5-325 MG PO TABS: 1.0000 | ORAL_TABLET | ORAL | Status: AC | PRN

## 2011-10-29 MED ORDER — DIAZEPAM 5 MG PO TABS: 5.0000 mg | ORAL_TABLET | Freq: Two times a day (BID) | ORAL | Status: AC | PRN

## 2011-10-29 MED ORDER — DIAZEPAM 5 MG PO TABS
5.0000 mg | ORAL_TABLET | Freq: Once | ORAL | Status: AC
  Administered 2011-10-29: 5 mg via ORAL
  Filled 2011-10-29: qty 1

## 2011-10-29 NOTE — Discharge Instructions (Signed)
Hyperthyroidism The thyroid is a large gland located in the lower front part of your neck. The thyroid helps control metabolism. Metabolism is how your body uses food. It controls metabolism with the hormone thyroxine. When the thyroid is overactive, it produces too much hormone. When this happens, these following problems may occur:   Nervousness   Heat intolerance   Weight loss (in spite of increase food intake)   Diarrhea   Change in hair or skin texture   Palpitations (heart skipping or having extra beats)   Tachycardia (rapid heart rate)   Loss of menstruation (amenorrhea)   Shaking of the hands  CAUSES  Grave's Disease (the immune system attacks the thyroid gland). This is the most common cause.   Inflammation of the thyroid gland.   Tumor (usually benign) in the thyroid gland or elsewhere.   Excessive use of thyroid medications (both prescription and 'natural').   Excessive ingestion of Iodine.  DIAGNOSIS  To prove hyperthyroidism, your caregiver may do blood tests and ultrasound tests. Sometimes the signs are hidden. It may be necessary for your caregiver to watch this illness with blood tests, either before or after diagnosis and treatment. TREATMENT Short-term treatment There are several treatments to control symptoms. Drugs called beta blockers may give some relief. Drugs that decrease hormone production will provide temporary relief in many people. These measures will usually not give permanent relief. Definitive therapy There are treatments available which can be discussed between you and your caregiver which will permanently treat the problem. These treatments range from surgery (removal of the thyroid), to the use of radioactive iodine (destroys the thyroid by radiation), to the use of antithyroid drugs (interfere with hormone synthesis). The first two treatments are permanent and usually successful. They most often require hormone replacement therapy for life.  This is because it is impossible to remove or destroy the exact amount of thyroid required to make a person euthyroid (normal). HOME CARE INSTRUCTIONS  See your caregiver if the problems you are being treated for get worse. Examples of this would be the problems listed above. SEEK MEDICAL CARE IF: Your general condition worsens. MAKE SURE YOU:   Understand these instructions.   Will watch your condition.   Will get help right away if you are not doing well or get worse.  Document Released: 06/16/2005 Document Revised: 06/05/2011 Document Reviewed: 10/28/2006 [facility] Patient Information 2012 [place], [place].Leg Cramps Leg cramps that occur during exercise can be caused by poor circulation or dehydration. However, muscle cramps that occur at rest or during the night are usually not due to any serious medical problem. Heat cramps may cause muscle spasms during hot weather.  CAUSES There is no clear cause for muscle cramps. However, dehydration may be a factor for those who do not drink enough fluids and those who exercise in the heat. Imbalances in the level of sodium, potassium, calcium or magnesium in the muscle tissue may also be a factor. Some medications, such as water pills (diuretics), may cause loss of chemicals that the body needs (like sodium and potassium) and cause muscle cramps. TREATMENT   Make sure your diet has enough fluids and essential minerals for the muscle to work normally.   Avoid strenuous exercise for several days if you have been having frequent leg cramps.   Stretch and massage the cramped muscle for several minutes.   Some medicines may be helpful in some patients with night cramps. Only take over-the-counter or prescription medicines as directed by your caregiver.  SEEK IMMEDIATE MEDICAL CARE IF:   Your leg cramps become worse.   Your foot becomes cold, numb, or blue.  Document Released: 07/24/2004 Document Revised: 06/05/2011 Document Reviewed:  07/11/2008 [facility] Patient Information 2012 [place], [place].

## 2011-10-29 NOTE — ED Notes (Signed)
Patient states pain hit him in left arm and left leg at same time.

## 2011-11-20 ENCOUNTER — Emergency Department (HOSPITAL_COMMUNITY)
Admission: EM | Admit: 2011-11-20 | Discharge: 2011-11-20 | Disposition: A | Discharge status: Home / Self Care | Payer: Self-pay | Attending: Emergency Medicine | Admitting: Emergency Medicine

## 2011-11-20 ENCOUNTER — Emergency Department (HOSPITAL_COMMUNITY): Payer: Self-pay

## 2011-11-20 ENCOUNTER — Encounter (HOSPITAL_COMMUNITY): Payer: Self-pay | Admitting: *Deleted

## 2011-11-20 DIAGNOSIS — R0989 Other specified symptoms and signs involving the circulatory and respiratory systems: Secondary | ICD-10-CM | POA: Insufficient documentation

## 2011-11-20 DIAGNOSIS — Z79899 Other long term (current) drug therapy: Secondary | ICD-10-CM | POA: Insufficient documentation

## 2011-11-20 DIAGNOSIS — I10 Essential (primary) hypertension: Secondary | ICD-10-CM | POA: Insufficient documentation

## 2011-11-20 DIAGNOSIS — R0609 Other forms of dyspnea: Secondary | ICD-10-CM | POA: Insufficient documentation

## 2011-11-20 DIAGNOSIS — Z8619 Personal history of other infectious and parasitic diseases: Secondary | ICD-10-CM | POA: Insufficient documentation

## 2011-11-20 DIAGNOSIS — R0602 Shortness of breath: Secondary | ICD-10-CM | POA: Insufficient documentation

## 2011-11-20 DIAGNOSIS — M549 Dorsalgia, unspecified: Secondary | ICD-10-CM | POA: Insufficient documentation

## 2011-11-20 DIAGNOSIS — Z7982 Long term (current) use of aspirin: Secondary | ICD-10-CM | POA: Insufficient documentation

## 2011-11-20 DIAGNOSIS — F341 Dysthymic disorder: Secondary | ICD-10-CM | POA: Insufficient documentation

## 2011-11-20 DIAGNOSIS — E0590 Thyrotoxicosis, unspecified without thyrotoxic crisis or storm: Secondary | ICD-10-CM | POA: Insufficient documentation

## 2011-11-20 DIAGNOSIS — G8929 Other chronic pain: Secondary | ICD-10-CM | POA: Insufficient documentation

## 2011-11-20 DIAGNOSIS — Z8673 Personal history of transient ischemic attack (TIA), and cerebral infarction without residual deficits: Secondary | ICD-10-CM | POA: Insufficient documentation

## 2011-11-20 DIAGNOSIS — M129 Arthropathy, unspecified: Secondary | ICD-10-CM | POA: Insufficient documentation

## 2011-11-20 DIAGNOSIS — R0600 Dyspnea, unspecified: Secondary | ICD-10-CM

## 2011-11-20 LAB — CARDIAC PANEL(CRET KIN+CKTOT+MB+TROPI)
CK, MB: 2.5 ng/mL (ref 0.3–4.0)
Relative Index: INVALID (ref 0.0–2.5)
Total CK: 63 U/L (ref 7–232)
Troponin I: <0.3 ng/mL (ref <0.30)

## 2011-11-20 LAB — CBC
HCT: 37.9 % — ABNORMAL LOW (ref 39.0–52.0)
Hemoglobin: 13.5 g/dL (ref 13.0–17.0)
MCH: 32.9 pg (ref 26.0–34.0)
MCHC: 35.6 g/dL (ref 30.0–36.0)
MCV: 92.4 fL (ref 78.0–100.0)
Platelets: 272 10*3/uL (ref 150–400)
RBC: 4.1 MIL/uL — ABNORMAL LOW (ref 4.22–5.81)
RDW: 12.4 % (ref 11.5–15.5)
WBC: 6.5 10*3/uL (ref 4.0–10.5)

## 2011-11-20 LAB — DIFFERENTIAL
Basophils Absolute: 0 10*3/uL (ref 0.0–0.1)
Basophils Relative: 1 % (ref 0–1)
Eosinophils Absolute: 0.1 10*3/uL (ref 0.0–0.7)
Eosinophils Relative: 2 % (ref 0–5)
Lymphocytes Relative: 45 % (ref 12–46)
Lymphs Abs: 3 10*3/uL (ref 0.7–4.0)
Monocytes Absolute: 0.7 10*3/uL (ref 0.1–1.0)
Monocytes Relative: 11 % (ref 3–12)
Neutro Abs: 2.7 10*3/uL (ref 1.7–7.7)
Neutrophils Relative %: 41 % — ABNORMAL LOW (ref 43–77)

## 2011-11-20 LAB — BASIC METABOLIC PANEL
BUN: 13 mg/dL (ref 6–23)
CO2: 30 mEq/L (ref 19–32)
Calcium: 9.2 mg/dL (ref 8.4–10.5)
Chloride: 98 mEq/L (ref 96–112)
Creatinine, Ser: 0.68 mg/dL (ref 0.50–1.35)
GFR calc Af Amer: >90 mL/min (ref >90)
GFR calc non Af Amer: >90 mL/min (ref >90)
Glucose, Bld: 92 mg/dL (ref 70–99)
Potassium: 3.8 mEq/L (ref 3.5–5.1)
Sodium: 137 mEq/L (ref 135–145)

## 2011-11-20 LAB — D-DIMER, QUANTITATIVE: D-Dimer, Quant: 0.46 ug/mL-FEU (ref 0.00–0.48)

## 2011-11-20 NOTE — ED Provider Notes (Signed)
History   This chart was scribed for Eric Octave, MD by Brooks Sailors. The patient was seen in room APA18/APA18. Patient's care was started at 1814.   CSN: [id number]  Arrival date & time 11/20/11  1814   First MD Initiated Contact with Patient 11/20/11 1836      Chief Complaint  Patient presents with  . Shortness of Breath    (Consider location/radiation/quality/duration/timing/severity/associated sxs/prior treatment) HPI  Eric Tran is a 61 y.o. male who presents to the Emergency Department complaining of episodes of severe SOB onset today. Pt says the episodes are a new problem each lasting around 2-3 mintues. Pt says he feels like someone was strangulating him and that "he was about to die." Denies a history of these problems. Per Pt says he has been taking a OTC drug similar to Viagra called "stiff-nights". Denies history of SOB, Chest pain, history of heart problems, lung problems. History of smoking for 48 years, and HTN.    Past Medical History  Diagnosis Date  . Hyperthyroidism   . Chronic back pain     chronic narcotic use  . Degenerative disc disease   . Carpal tunnel syndrome   . Arthritis   . Hypertension   . Chronic hepatitis C 1980    never had any treatment  . CVA (cerebral infarction) 06/2010  . Anxiety   . Depression   . UTI (lower urinary tract infection)     Past Surgical History  Procedure Date  . Back surgery     C4-C5 fusion  . Hand surgery     right  . Neck surgery   . Right hand reconstruction     Family History  Problem Relation Age of Onset  . Colon cancer Neg Hx   . Colon polyps Neg Hx   . Cervical cancer Mother   . Lung cancer Sister   . Breast cancer Sister   . Heart failure Father     History  Substance Use Topics  . Smoking status: Current Everyday Smoker -- 1.0 packs/day for 45 years    Types: Cigarettes  . Smokeless tobacco: Never Used  . Alcohol Use: Yes     couple beers/shots of Ree Kida or Lynwood Dawley couple times per  month; heavy etoh x couple yrs 40y ago      Review of Systems  All other systems reviewed and are negative.    Allergies  Morphine and related  Home Medications   Current Outpatient Rx  Name Route Sig Dispense Refill  . ALPRAZOLAM 0.5 MG PO TABS Oral Take 0.5 mg by mouth 3 (three) times daily as needed. For sleep and anxiety    . ASPIRIN 81 MG PO TABS Oral Take 81 mg by mouth daily.     . DULOXETINE HCL 60 MG PO CPEP Oral Take 60 mg by mouth daily.      Marland Kitchen HYDROCODONE-ACETAMINOPHEN 5-325 MG PO TABS Oral Take 1 tablet by mouth every 6 (six) hours as needed. For pain     . LISINOPRIL-HYDROCHLOROTHIAZIDE 20-12.5 MG PO TABS Oral Take 1 tablet by mouth daily.      Marland Kitchen METHIMAZOLE 10 MG PO TABS Oral Take 15 mg by mouth 2 (two) times daily. **Take one & one-half tablet by mouth twice daily**    . OVER THE COUNTER MEDICATION Oral Take 1 capsule by mouth as needed. STIFF NIGHTS (sexual male enhancement)    . OXYCODONE HCL 5 MG PO CAPS Oral Take 5 mg by mouth 4 (four)  times daily as needed. For pain    . TAMSULOSIN HCL 0.4 MG PO CAPS Oral Take 0.4 mg by mouth daily.       BP 128/86  Pulse 91  Temp(Src) 97.4 F (36.3 C) (Oral)  Resp 16  Ht 5\' 8"  (1.727 m)  Wt 132 lb (59.875 kg)  BMI 20.07 kg/m2  SpO2 100%  Physical Exam  Nursing note and vitals reviewed. Constitutional: He is oriented to person, place, and time. He appears well-developed and well-nourished.  HENT:  Head: Normocephalic and atraumatic.  Eyes: Conjunctivae and EOM are normal. Pupils are equal, round, and reactive to light.  Neck: Normal range of motion. Neck supple.  Cardiovascular: Normal rate and regular rhythm.   Pulmonary/Chest:       Decreased breath sounds throughout  Abdominal: Soft. Bowel sounds are normal.  Musculoskeletal: Normal range of motion.  Neurological: He is alert and oriented to person, place, and time.  Skin: Skin is warm and dry.  Psychiatric: He has a normal mood and affect.    ED Course    Procedures (including critical care time)  Pt seen at 1953. Will have blood with and Chest x-ray.   Labs Reviewed  CBC - Abnormal; Notable for the following:    RBC 4.10 (*)    HCT 37.9 (*)    All other components within normal limits  DIFFERENTIAL - Abnormal; Notable for the following:    Neutrophils Relative 41 (*)    All other components within normal limits  BASIC METABOLIC PANEL  CARDIAC PANEL(CRET KIN+CKTOT+MB+TROPI)  D-DIMER, QUANTITATIVE  LAB REPORT - SCANNED   Dg Chest 2 View  11/20/2011  *RADIOLOGY REPORT*  Clinical Data: Shortness of breath.  CHEST - 2 VIEW  Comparison: Chest x-ray 07/19/2010.  Findings: Lung volumes are normal.  No consolidative airspace disease.  No pleural effusions.  No pneumothorax.  No pulmonary nodule or mass noted.  Pulmonary vasculature and the cardiomediastinal silhouette are within normal limits. Old anterior right-sided rib fractures again noted.  IMPRESSION: 1. No radiographic evidence of acute cardiopulmonary disease.  Original Report Authenticated By: Florencia Reasons, M.D.     1. Dyspnea       MDM  Intermittent sudden onset shortness of breath after taking sexual stimulant called "stiff nights". No chest pain, cough, fever. Has taken medication before without side effects. Did not have any sexual activity.  CXR negative.  Lungs clear.  EKG nonischemic.  Troponin and d-dimer negative.  Unable to find ingredients of "stiff nights".   Patient feeling better.  No hypoxia, tachypnea, increased work of breathing, chest pain, tachycardia. Stable for outpatient followup.  Advised to discontinue use of stimulant.     Date: 11/20/2011  Rate: 72  Rhythm: normal sinus rhythm  QRS Axis: normal  Intervals: normal  ST/T Wave abnormalities: normal  Conduction Disutrbances:none  Narrative Interpretation:   Old EKG Reviewed: unchanged     I personally performed the services described in this documentation, which was scribed in my  presence.  The recorded information has been reviewed and considered.      Eric Octave, MD 11/21/11 2017

## 2011-11-20 NOTE — ED Notes (Signed)
Sudden onset sob lasting 3 min then again 2 min.  And 1 more episode.  Feel s  His breathing is "labored"

## 2011-11-20 NOTE — ED Notes (Signed)
Significant other concerned about his reaction to sexual enhancement drug STIFF NITE, patient is ready to leave and is out in the hall, does not wish to speak to EDP  And denies any other concerns

## 2011-11-20 NOTE — ED Notes (Signed)
Pt states he took a sexual stimulant at 1630. Episodes of SOB shortly after taking meds (approx 5 min after taking med)

## 2011-12-03 ENCOUNTER — Telehealth: Payer: Self-pay | Admitting: Gastroenterology

## 2011-12-03 NOTE — Telephone Encounter (Signed)
Called to follow up on referral to [facility]- pt was scheduled to see Dr Jacqualine Mau on 03/14 but cancelled the appt and did not R/S

## 2012-02-04 ENCOUNTER — Emergency Department (HOSPITAL_COMMUNITY)
Admission: EM | Admit: 2012-02-04 | Discharge: 2012-02-05 | Disposition: A | Discharge status: Home / Self Care | Payer: Self-pay | Attending: Emergency Medicine | Admitting: Emergency Medicine

## 2012-02-04 ENCOUNTER — Encounter (HOSPITAL_COMMUNITY): Payer: Self-pay | Admitting: *Deleted

## 2012-02-04 ENCOUNTER — Emergency Department (HOSPITAL_COMMUNITY): Payer: Self-pay

## 2012-02-04 DIAGNOSIS — I10 Essential (primary) hypertension: Secondary | ICD-10-CM | POA: Insufficient documentation

## 2012-02-04 DIAGNOSIS — B1920 Unspecified viral hepatitis C without hepatic coma: Secondary | ICD-10-CM | POA: Insufficient documentation

## 2012-02-04 DIAGNOSIS — R569 Unspecified convulsions: Secondary | ICD-10-CM | POA: Insufficient documentation

## 2012-02-04 DIAGNOSIS — IMO0002 Reserved for concepts with insufficient information to code with codable children: Secondary | ICD-10-CM | POA: Insufficient documentation

## 2012-02-04 DIAGNOSIS — Z8673 Personal history of transient ischemic attack (TIA), and cerebral infarction without residual deficits: Secondary | ICD-10-CM | POA: Insufficient documentation

## 2012-02-04 DIAGNOSIS — M129 Arthropathy, unspecified: Secondary | ICD-10-CM | POA: Insufficient documentation

## 2012-02-04 DIAGNOSIS — G8929 Other chronic pain: Secondary | ICD-10-CM | POA: Insufficient documentation

## 2012-02-04 LAB — CBC WITH DIFFERENTIAL/PLATELET
Basophils Absolute: 0 10*3/uL (ref 0.0–0.1)
Basophils Relative: 0 % (ref 0–1)
Eosinophils Absolute: 0 10*3/uL (ref 0.0–0.7)
Eosinophils Relative: 0 % (ref 0–5)
HCT: 46.4 % (ref 39.0–52.0)
Hemoglobin: 16.9 g/dL (ref 13.0–17.0)
Lymphocytes Relative: 38 % (ref 12–46)
Lymphs Abs: 2.9 10*3/uL (ref 0.7–4.0)
MCH: 32.9 pg (ref 26.0–34.0)
MCHC: 36.4 g/dL — ABNORMAL HIGH (ref 30.0–36.0)
MCV: 90.4 fL (ref 78.0–100.0)
Monocytes Absolute: 0.9 10*3/uL (ref 0.1–1.0)
Monocytes Relative: 12 % (ref 3–12)
Neutro Abs: 3.8 10*3/uL (ref 1.7–7.7)
Neutrophils Relative %: 50 % (ref 43–77)
Platelets: 204 10*3/uL (ref 150–400)
RBC: 5.13 MIL/uL (ref 4.22–5.81)
RDW: 12.6 % (ref 11.5–15.5)
WBC: 7.6 10*3/uL (ref 4.0–10.5)

## 2012-02-04 LAB — URINALYSIS, ROUTINE W REFLEX MICROSCOPIC
Glucose, UA: NEGATIVE mg/dL
Hgb urine dipstick: NEGATIVE
Leukocytes, UA: NEGATIVE
Nitrite: NEGATIVE
Protein, ur: NEGATIVE mg/dL
Specific Gravity, Urine: 1.025 (ref 1.005–1.030)
Urobilinogen, UA: 1 mg/dL (ref 0.0–1.0)
pH: 6 (ref 5.0–8.0)

## 2012-02-04 LAB — COMPREHENSIVE METABOLIC PANEL
ALT: 51 U/L (ref 0–53)
AST: 52 U/L — ABNORMAL HIGH (ref 0–37)
Albumin: 4.4 g/dL (ref 3.5–5.2)
Alkaline Phosphatase: 68 U/L (ref 39–117)
BUN: 16 mg/dL (ref 6–23)
CO2: 28 mEq/L (ref 19–32)
Calcium: 9.8 mg/dL (ref 8.4–10.5)
Chloride: 98 mEq/L (ref 96–112)
Creatinine, Ser: 0.68 mg/dL (ref 0.50–1.35)
GFR calc Af Amer: >90 mL/min (ref >90)
GFR calc non Af Amer: >90 mL/min (ref >90)
Glucose, Bld: 100 mg/dL — ABNORMAL HIGH (ref 70–99)
Potassium: 4 mEq/L (ref 3.5–5.1)
Sodium: 138 mEq/L (ref 135–145)
Total Bilirubin: 0.8 mg/dL (ref 0.3–1.2)
Total Protein: 7.8 g/dL (ref 6.0–8.3)

## 2012-02-04 LAB — MAGNESIUM: Magnesium: 2 mg/dL (ref 1.5–2.5)

## 2012-02-04 LAB — LIPASE, BLOOD: Lipase: 59 U/L (ref 11–59)

## 2012-02-04 MED ORDER — SODIUM CHLORIDE 0.9 % IV BOLUS (SEPSIS)
250.0000 mL | Freq: Once | INTRAVENOUS | Status: AC
  Administered 2012-02-04: 1000 mL via INTRAVENOUS

## 2012-02-04 MED ORDER — SODIUM CHLORIDE 0.9 % IV SOLN: INTRAVENOUS | Status: DC

## 2012-02-04 NOTE — ED Provider Notes (Signed)
History    This chart was scribed for Shelda Jakes, MD, MD by Smitty Pluck. The patient was seen in room APA10 and the patient's care was started at 8:10PM.   CSN: [id number]  Arrival date & time 02/04/12  1819   First MD Initiated Contact with Patient 02/04/12 1935      Chief Complaint  Patient presents with  . Near Syncope    (Consider location/radiation/quality/duration/timing/severity/associated sxs/prior treatment) Patient is a 61 y.o. male presenting with syncope. The history is provided by the patient.  Loss of Consciousness This is a recurrent problem. The current episode started more than 1 week ago. The problem occurs every several days. The problem has not changed since onset.Associated symptoms include headaches. Pertinent negatives include no chest pain, no abdominal pain and no shortness of breath. Nothing aggravates the symptoms. Nothing relieves the symptoms. He has tried nothing for the symptoms.   Eric Tran is a 61 y.o. male who presents to the Emergency Department complaining of syncope onset 1 month ago. Pt's family reports that pt was stiff, arched back with his arms raised while trembling during episode that lasted 10 seconds. Pt does not remember what happened. He denies urinary incontinence. He reports there has been at least 12 similar episodes within last month. Family reports that pt was verbally nonresponsive during episode. He reports that he had LOC. Pt's family reports that he had unsteady gait tonight. Pt has had mass in neck 3 months ago and was seen at [facility] for it but is awaiting test results. Over past 3 months patient has had decreased appetite, weight loss and weakness in left shoulder and arm. Pt has n/v/d 1 day ago. Pt has night sweats, headaches and visual changes. Denies fever, weakness in feet and fingers, neck pain and rash.  Abdominal aortic aneurysm, hyperthyroidism and HTN. Pt was  PCP is Dr. Al Corpus at [facility] Past Medical History    Diagnosis Date  . Hyperthyroidism   . Chronic back pain     chronic narcotic use  . Degenerative disc disease   . Carpal tunnel syndrome   . Arthritis   . Hypertension   . Chronic hepatitis C 1980    never had any treatment  . CVA (cerebral infarction) 06/2010  . Anxiety   . Depression   . UTI (lower urinary tract infection)     Past Surgical History  Procedure Date  . Hand surgery     right  . Neck surgery   . Right hand reconstruction     Family History  Problem Relation Age of Onset  . Colon cancer Neg Hx   . Colon polyps Neg Hx   . Cervical cancer Mother   . Lung cancer Sister   . Breast cancer Sister   . Heart failure Father     History  Substance Use Topics  . Smoking status: Current Everyday Smoker -- 1.0 packs/day for 45 years    Types: Cigarettes  . Smokeless tobacco: Never Used  . Alcohol Use: Yes     couple beers/shots of Ree Kida or Lynwood Dawley couple times per month; heavy etoh x couple yrs 40y ago      Review of Systems  Constitutional: Negative for fever and chills.  Respiratory: Negative for cough and shortness of breath.   Cardiovascular: Positive for syncope. Negative for chest pain.  Gastrointestinal: Positive for nausea, vomiting and diarrhea. Negative for abdominal pain.  Musculoskeletal: Positive for gait problem.  Neurological: Positive for syncope  and headaches.    Allergies  Morphine and related  Home Medications   Current Outpatient Rx  Name Route Sig Dispense Refill  . ALPRAZOLAM 0.5 MG PO TABS Oral Take 0.5 mg by mouth 3 (three) times daily as needed. For sleep and anxiety    . ASPIRIN 81 MG PO TABS Oral Take 81 mg by mouth daily.     . DULOXETINE HCL 60 MG PO CPEP Oral Take 60 mg by mouth 2 (two) times daily.     . OMEGA-3 FATTY ACIDS 1000 MG PO CAPS Oral Take 2 g by mouth daily.    Marland Kitchen GLUCOSAMINE CHONDR COMPLEX PO Oral Take 2 tablets by mouth daily.    Marland Kitchen HYDROCODONE-ACETAMINOPHEN 5-325 MG PO TABS Oral Take 1 tablet by mouth every 6  (six) hours as needed. For pain     . LISINOPRIL-HYDROCHLOROTHIAZIDE 20-12.5 MG PO TABS Oral Take 2 tablets by mouth daily.     Marland Kitchen METHIMAZOLE 10 MG PO TABS Oral Take 10 mg by mouth daily.    Marland Kitchen OVER THE COUNTER MEDICATION Oral Take 1 capsule by mouth as needed. STIFF NIGHTS (sexual male enhancement)    . OXYCODONE HCL 5 MG PO CAPS Oral Take 5 mg by mouth 4 (four) times daily as needed. For pain    . TAMSULOSIN HCL 0.4 MG PO CAPS Oral Take 0.4 mg by mouth daily.     Marland Kitchen LEVETIRACETAM 500 MG PO TABS Oral Take 1 tablet (500 mg total) by mouth every 12 (twelve) hours. 28 tablet 1    BP 121/77  Pulse 113  Temp 98.2 F (36.8 C) (Oral)  Resp 20  Ht 5\' 8"  (1.727 m)  Wt 130 lb (58.968 kg)  BMI 19.77 kg/m2  SpO2 100%  Physical Exam  Nursing note and vitals reviewed. Constitutional: He is oriented to person, place, and time. He appears well-developed and well-nourished. No distress.  HENT:  Head: Normocephalic and atraumatic.  Neck:       2cm left node on right side of neck No supraclavicular or infraclavicular nodes No nodes on right  Cardiovascular: Normal rate, regular rhythm and normal heart sounds.   No murmur heard. Pulmonary/Chest: Effort normal and breath sounds normal. No respiratory distress. He has no wheezes.  Abdominal: Bowel sounds are normal. He exhibits no distension. There is no tenderness.  Neurological: He is alert and oriented to person, place, and time. Coordination normal.  Skin: Skin is warm and dry.  Psychiatric: He has a normal mood and affect. His behavior is normal.    ED Course  Procedures (including critical care time) DIAGNOSTIC STUDIES: Oxygen Saturation is 100% on room air, normal by my interpretation.    COORDINATION OF CARE: 8:15PM EDP discusses pt ED treatment with pt  8:56PM EDP ordered medication:  Scheduled Meds:    . sodium chloride  250 mL Intravenous Once   Continuous Infusions:    . sodium chloride     PRN Meds:.    Date:  02/04/2012  Rate: 78  Rhythm: normal sinus rhythm and sinus arrhythmia  QRS Axis: normal  Intervals: normal  ST/T Wave abnormalities: nonspecific ST/T changes  Conduction Disutrbances:none  Narrative Interpretation:   Old EKG Reviewed: changes noted   Labs Reviewed  CBC WITH DIFFERENTIAL - Abnormal; Notable for the following:    MCHC 36.4 (*)     All other components within normal limits  COMPREHENSIVE METABOLIC PANEL - Abnormal; Notable for the following:    Glucose, Bld 100 (*)  AST 52 (*)     All other components within normal limits  URINALYSIS, ROUTINE W REFLEX MICROSCOPIC - Abnormal; Notable for the following:    Bilirubin Urine SMALL (*)     Ketones, ur TRACE (*)     All other components within normal limits  LIPASE, BLOOD  MAGNESIUM   Dg Chest 2 View  02/04/2012  *RADIOLOGY REPORT*  Clinical Data: Dizziness, near-syncopal episode, smoker  CHEST - 2 VIEW  Comparison: 11/19/2036  Findings: Stable hyperinflation without focal pneumonia, edema, effusion or pneumothorax.  Normal heart size and vascularity. Trachea midline.  Slight curvature of the spine evident.  IMPRESSION: Stable hyperinflation without acute chest process  Original Report Authenticated By: Judie Petit. Ruel Favors, M.D.   Ct Head Wo Contrast  02/04/2012  *RADIOLOGY REPORT*  Clinical Data: Dizziness.  Blacking out.  Memory lapse for 1 month. Smoker.  Previous stroke history.  CT HEAD WITHOUT CONTRAST  Technique:  Contiguous axial images were obtained from the base of the skull through the vertex without contrast.  Comparison: CT head 07/19/2010.  MRI brain 07/19/2010.  Findings: Minimal cerebral atrophy.  Low attenuation changes in the deep white matter consistent with small vessel ischemia.  Old lacunar infarcts in the basal ganglia bilaterally.  No mass effect or midline shift.  No abnormal extra-axial fluid collections.  Wallace Cullens- white matter junctions are distinct.  Basal cisterns are not effaced.  No evidence of acute  intracranial hemorrhage.  No depressed skull fractures.  Visualized paranasal sinuses and mastoid air cells are not opacified.  Vascular calcifications.  IMPRESSION: No acute intracranial abnormalities.  Mild atrophy and small vessel ischemic changes are stable since previous study.  Original Report Authenticated By: Marlon Pel, M.D.   Results for orders placed during the hospital encounter of 02/04/12  CBC WITH DIFFERENTIAL      Component Value Range   WBC 7.6  4.0 - 10.5 K/uL   RBC 5.13  4.22 - 5.81 MIL/uL   Hemoglobin 16.9  13.0 - 17.0 g/dL   HCT 45.4  09.8 - 11.9 %   MCV 90.4  78.0 - 100.0 fL   MCH 32.9  26.0 - 34.0 pg   MCHC 36.4 (*) 30.0 - 36.0 g/dL   RDW 14.7  82.9 - 56.2 %   Platelets 204  150 - 400 K/uL   Neutrophils Relative 50  43 - 77 %   Neutro Abs 3.8  1.7 - 7.7 K/uL   Lymphocytes Relative 38  12 - 46 %   Lymphs Abs 2.9  0.7 - 4.0 K/uL   Monocytes Relative 12  3 - 12 %   Monocytes Absolute 0.9  0.1 - 1.0 K/uL   Eosinophils Relative 0  0 - 5 %   Eosinophils Absolute 0.0  0.0 - 0.7 K/uL   Basophils Relative 0  0 - 1 %   Basophils Absolute 0.0  0.0 - 0.1 K/uL  COMPREHENSIVE METABOLIC PANEL      Component Value Range   Sodium 138  135 - 145 mEq/L   Potassium 4.0  3.5 - 5.1 mEq/L   Chloride 98  96 - 112 mEq/L   CO2 28  19 - 32 mEq/L   Glucose, Bld 100 (*) 70 - 99 mg/dL   BUN 16  6 - 23 mg/dL   Creatinine, Ser 1.30  0.50 - 1.35 mg/dL   Calcium 9.8  8.4 - 86.5 mg/dL   Total Protein 7.8  6.0 - 8.3 g/dL   Albumin 4.4  3.5 - 5.2 g/dL   AST 52 (*) 0 - 37 U/L   ALT 51  0 - 53 U/L   Alkaline Phosphatase 68  39 - 117 U/L   Total Bilirubin 0.8  0.3 - 1.2 mg/dL   GFR calc non Af Amer >90  >90 mL/min   GFR calc Af Amer >90  >90 mL/min  LIPASE, BLOOD      Component Value Range   Lipase 59  11 - 59 U/L  URINALYSIS, ROUTINE W REFLEX MICROSCOPIC      Component Value Range   Color, Urine YELLOW  YELLOW   APPearance CLEAR  CLEAR   Specific Gravity, Urine 1.025  1.005 -  1.030   pH 6.0  5.0 - 8.0   Glucose, UA NEGATIVE  NEGATIVE mg/dL   Hgb urine dipstick NEGATIVE  NEGATIVE   Bilirubin Urine SMALL (*) NEGATIVE   Ketones, ur TRACE (*) NEGATIVE mg/dL   Protein, ur NEGATIVE  NEGATIVE mg/dL   Urobilinogen, UA 1.0  0.0 - 1.0 mg/dL   Nitrite NEGATIVE  NEGATIVE   Leukocytes, UA NEGATIVE  NEGATIVE  MAGNESIUM      Component Value Range   Magnesium 2.0  1.5 - 2.5 mg/dL      1. Seizure       MDM  Patient with recurrent seizures but they've been ongoing for about a month daughter just found out about it today at least 3 today of their brief seemed to have a period of loss of consciousness with tonic-clonic movement but then he recovers quickly usually under 5 minutes. Workup here in the emergency part without specific findings. Patient is followed by the [place] in [place]. Discussed with hospitalist about admission due to recurrent of 3 seizures a dense today but they did not want to admit. Patient given referral to neurology and started on Keppra. Workup head CT was negative liver function tests without significant abnormalities electrolytes are normal and he seems normal no leukocytosis no anemia.   I personally performed the services described in this documentation, which was scribed in my presence. The recorded information has been reviewed and considered.        Shelda Jakes, MD 02/05/12 0005

## 2012-02-04 NOTE — ED Notes (Signed)
Wife describes his syncopal episodes as seizures, also states he has memory loss events that are becoming more frequent

## 2012-02-04 NOTE — ED Notes (Addendum)
Walked outside this afternoon and pt arched back, eyes rolled back in his head, unable to speak, rigid and shaking. Pt was caught by family member to avoid falling. Pt states that was the third episode of this happening today per pt.  Pt has blacked out numerous times this week, per pt. Knot in neck was found in neck and had CT June 5th in [place]. Continued and increasing pain to left chest, neck and arm since finding the knot on the neck.

## 2012-02-04 NOTE — ED Notes (Addendum)
Pt resting quietly in room.  Expressing desire to go home, rather than be admitted.  Encouraged pt to discuss questions or concerns with physician regarding admission. Pt agreeable to discussion.

## 2012-02-05 MED ORDER — LEVETIRACETAM 500 MG PO TABS: 500.0000 mg | ORAL_TABLET | Freq: Two times a day (BID) | ORAL | Status: DC

## 2012-02-05 NOTE — ED Notes (Signed)
Pt walked out of department deciding to not be admitted.  Did not wait for discharge instructions, referral or prescription that the physician printed.  Pt discontinued IV by himself.  Unknown condition of site or catheter.  Pt did not sign electronic signature for AMA or receipt of instructions.

## 2012-07-26 ENCOUNTER — Inpatient Hospital Stay (HOSPITAL_COMMUNITY)
Admission: EM | Admit: 2012-07-26 | Discharge: 2012-07-29 | DRG: 690 | Discharge status: Against Medical Advice | Payer: Non-veteran care | Attending: Internal Medicine | Admitting: Internal Medicine

## 2012-07-26 ENCOUNTER — Emergency Department (HOSPITAL_COMMUNITY): Payer: Non-veteran care

## 2012-07-26 ENCOUNTER — Inpatient Hospital Stay (HOSPITAL_COMMUNITY): Payer: Non-veteran care

## 2012-07-26 ENCOUNTER — Encounter (HOSPITAL_COMMUNITY): Payer: Self-pay | Admitting: *Deleted

## 2012-07-26 DIAGNOSIS — F17200 Nicotine dependence, unspecified, uncomplicated: Secondary | ICD-10-CM

## 2012-07-26 DIAGNOSIS — B9789 Other viral agents as the cause of diseases classified elsewhere: Secondary | ICD-10-CM

## 2012-07-26 DIAGNOSIS — B349 Viral infection, unspecified: Secondary | ICD-10-CM

## 2012-07-26 DIAGNOSIS — N401 Enlarged prostate with lower urinary tract symptoms: Secondary | ICD-10-CM | POA: Diagnosis present

## 2012-07-26 DIAGNOSIS — E871 Hypo-osmolality and hyponatremia: Secondary | ICD-10-CM

## 2012-07-26 DIAGNOSIS — F411 Generalized anxiety disorder: Secondary | ICD-10-CM

## 2012-07-26 DIAGNOSIS — R51 Headache: Secondary | ICD-10-CM

## 2012-07-26 DIAGNOSIS — E860 Dehydration: Secondary | ICD-10-CM

## 2012-07-26 DIAGNOSIS — F3289 Other specified depressive episodes: Secondary | ICD-10-CM | POA: Diagnosis present

## 2012-07-26 DIAGNOSIS — E0590 Thyrotoxicosis, unspecified without thyrotoxic crisis or storm: Secondary | ICD-10-CM

## 2012-07-26 DIAGNOSIS — I69959 Hemiplegia and hemiparesis following unspecified cerebrovascular disease affecting unspecified side: Secondary | ICD-10-CM

## 2012-07-26 DIAGNOSIS — G8929 Other chronic pain: Secondary | ICD-10-CM | POA: Diagnosis present

## 2012-07-26 DIAGNOSIS — J189 Pneumonia, unspecified organism: Secondary | ICD-10-CM | POA: Diagnosis present

## 2012-07-26 DIAGNOSIS — Z23 Encounter for immunization: Secondary | ICD-10-CM

## 2012-07-26 DIAGNOSIS — R52 Pain, unspecified: Secondary | ICD-10-CM

## 2012-07-26 DIAGNOSIS — R350 Frequency of micturition: Secondary | ICD-10-CM | POA: Diagnosis present

## 2012-07-26 DIAGNOSIS — R519 Headache, unspecified: Secondary | ICD-10-CM | POA: Diagnosis present

## 2012-07-26 DIAGNOSIS — F410 Panic disorder [episodic paroxysmal anxiety] without agoraphobia: Secondary | ICD-10-CM | POA: Diagnosis present

## 2012-07-26 DIAGNOSIS — F419 Anxiety disorder, unspecified: Secondary | ICD-10-CM

## 2012-07-26 DIAGNOSIS — M129 Arthropathy, unspecified: Secondary | ICD-10-CM | POA: Diagnosis present

## 2012-07-26 DIAGNOSIS — A498 Other bacterial infections of unspecified site: Secondary | ICD-10-CM | POA: Diagnosis present

## 2012-07-26 DIAGNOSIS — Z720 Tobacco use: Secondary | ICD-10-CM

## 2012-07-26 DIAGNOSIS — N12 Tubulo-interstitial nephritis, not specified as acute or chronic: Secondary | ICD-10-CM

## 2012-07-26 DIAGNOSIS — F329 Major depressive disorder, single episode, unspecified: Secondary | ICD-10-CM | POA: Diagnosis present

## 2012-07-26 DIAGNOSIS — R109 Unspecified abdominal pain: Secondary | ICD-10-CM

## 2012-07-26 DIAGNOSIS — R10A0 Flank pain, unspecified side: Secondary | ICD-10-CM

## 2012-07-26 DIAGNOSIS — B182 Chronic viral hepatitis C: Secondary | ICD-10-CM | POA: Diagnosis present

## 2012-07-26 DIAGNOSIS — N138 Other obstructive and reflux uropathy: Secondary | ICD-10-CM | POA: Diagnosis present

## 2012-07-26 HISTORY — DX: Cerebral infarction, unspecified: I63.9

## 2012-07-26 LAB — BASIC METABOLIC PANEL
BUN: 22 mg/dL (ref 6–23)
CO2: 24 mEq/L (ref 19–32)
Calcium: 8.9 mg/dL (ref 8.4–10.5)
Chloride: 89 mEq/L — ABNORMAL LOW (ref 96–112)
Creatinine, Ser: 0.78 mg/dL (ref 0.50–1.35)
GFR calc Af Amer: >90 mL/min (ref >90)
GFR calc non Af Amer: >90 mL/min (ref >90)
Glucose, Bld: 126 mg/dL — ABNORMAL HIGH (ref 70–99)
Potassium: 3.7 mEq/L (ref 3.5–5.1)
Sodium: 126 mEq/L — ABNORMAL LOW (ref 135–145)

## 2012-07-26 LAB — URINALYSIS, ROUTINE W REFLEX MICROSCOPIC
Glucose, UA: 100 mg/dL — AB
Ketones, ur: NEGATIVE mg/dL
Nitrite: NEGATIVE
Protein, ur: 100 mg/dL — AB
Specific Gravity, Urine: 1.02 (ref 1.005–1.030)
Urobilinogen, UA: 4 mg/dL — ABNORMAL HIGH (ref 0.0–1.0)
pH: 7 (ref 5.0–8.0)

## 2012-07-26 LAB — CBC WITH DIFFERENTIAL/PLATELET
Basophils Absolute: 0 10*3/uL (ref 0.0–0.1)
Basophils Relative: 0 % (ref 0–1)
Eosinophils Absolute: 0 10*3/uL (ref 0.0–0.7)
Eosinophils Relative: 0 % (ref 0–5)
HCT: 40.9 % (ref 39.0–52.0)
Hemoglobin: 15 g/dL (ref 13.0–17.0)
Lymphocytes Relative: 5 % — ABNORMAL LOW (ref 12–46)
Lymphs Abs: 1 10*3/uL (ref 0.7–4.0)
MCH: 33.4 pg (ref 26.0–34.0)
MCHC: 36.7 g/dL — ABNORMAL HIGH (ref 30.0–36.0)
MCV: 91.1 fL (ref 78.0–100.0)
Monocytes Absolute: 1.8 10*3/uL — ABNORMAL HIGH (ref 0.1–1.0)
Monocytes Relative: 9 % (ref 3–12)
Neutro Abs: 17.4 10*3/uL — ABNORMAL HIGH (ref 1.7–7.7)
Neutrophils Relative %: 86 % — ABNORMAL HIGH (ref 43–77)
Platelets: 145 10*3/uL — ABNORMAL LOW (ref 150–400)
RBC: 4.49 MIL/uL (ref 4.22–5.81)
RDW: 13 % (ref 11.5–15.5)
WBC: 20.2 10*3/uL — ABNORMAL HIGH (ref 4.0–10.5)

## 2012-07-26 LAB — T4, FREE: Free T4: 1.1 ng/dL (ref 0.80–1.80)

## 2012-07-26 LAB — INFLUENZA PANEL BY PCR (TYPE A & B)
H1N1 flu by pcr: NOT DETECTED
Influenza A By PCR: NEGATIVE
Influenza B By PCR: NEGATIVE

## 2012-07-26 LAB — CBC
HCT: 39.2 % (ref 39.0–52.0)
Hemoglobin: 14.2 g/dL (ref 13.0–17.0)
MCH: 32.9 pg (ref 26.0–34.0)
MCHC: 36.2 g/dL — ABNORMAL HIGH (ref 30.0–36.0)
MCV: 90.7 fL (ref 78.0–100.0)
Platelets: 140 10*3/uL — ABNORMAL LOW (ref 150–400)
RBC: 4.32 MIL/uL (ref 4.22–5.81)
RDW: 12.9 % (ref 11.5–15.5)
WBC: 20.3 10*3/uL — ABNORMAL HIGH (ref 4.0–10.5)

## 2012-07-26 LAB — URINE MICROSCOPIC-ADD ON

## 2012-07-26 LAB — TSH: TSH: 1.095 u[IU]/mL (ref 0.350–4.500)

## 2012-07-26 LAB — HIV ANTIBODY (ROUTINE TESTING W REFLEX): HIV: NONREACTIVE

## 2012-07-26 MED ORDER — SORBITOL 70 % SOLN: 30.0000 mL | Freq: Every day | Status: DC | PRN

## 2012-07-26 MED ORDER — DULOXETINE HCL 60 MG PO CPEP
60.0000 mg | ORAL_CAPSULE | Freq: Two times a day (BID) | ORAL | Status: DC
  Administered 2012-07-26 – 2012-07-29 (×7): 60 mg via ORAL
  Filled 2012-07-26 (×7): qty 1

## 2012-07-26 MED ORDER — HYDROMORPHONE HCL PF 1 MG/ML IJ SOLN
1.0000 mg | Freq: Once | INTRAMUSCULAR | Status: AC
  Administered 2012-07-26: 1 mg via INTRAVENOUS
  Filled 2012-07-26: qty 1

## 2012-07-26 MED ORDER — OSELTAMIVIR PHOSPHATE 75 MG PO CAPS
75.0000 mg | ORAL_CAPSULE | Freq: Two times a day (BID) | ORAL | Status: DC
  Filled 2012-07-26: qty 1

## 2012-07-26 MED ORDER — AZITHROMYCIN 500 MG IV SOLR
500.0000 mg | INTRAVENOUS | Status: DC
  Administered 2012-07-27: 500 mg via INTRAVENOUS
  Filled 2012-07-26: qty 500

## 2012-07-26 MED ORDER — DIPHENHYDRAMINE HCL 50 MG/ML IJ SOLN
25.0000 mg | Freq: Once | INTRAMUSCULAR | Status: AC
  Administered 2012-07-26: 25 mg via INTRAVENOUS
  Filled 2012-07-26: qty 1

## 2012-07-26 MED ORDER — OXYCODONE HCL 5 MG PO TABS
5.0000 mg | ORAL_TABLET | ORAL | Status: DC | PRN
  Administered 2012-07-26 – 2012-07-27 (×3): 5 mg via ORAL
  Filled 2012-07-26 (×3): qty 1

## 2012-07-26 MED ORDER — GUAIFENESIN ER 600 MG PO TB12
1200.0000 mg | ORAL_TABLET | Freq: Two times a day (BID) | ORAL | Status: DC
  Administered 2012-07-26 – 2012-07-29 (×7): 1200 mg via ORAL
  Filled 2012-07-26 (×7): qty 2

## 2012-07-26 MED ORDER — LISINOPRIL 10 MG PO TABS: 20.0000 mg | ORAL_TABLET | Freq: Every day | ORAL | Status: DC

## 2012-07-26 MED ORDER — DEXTROSE 5 % IV SOLN
1.0000 g | Freq: Once | INTRAVENOUS | Status: AC
  Administered 2012-07-26: 1 g via INTRAVENOUS
  Filled 2012-07-26: qty 10

## 2012-07-26 MED ORDER — HYDROCHLOROTHIAZIDE 25 MG PO TABS
25.0000 mg | ORAL_TABLET | Freq: Every day | ORAL | Status: DC
  Administered 2012-07-26: 25 mg via ORAL
  Filled 2012-07-26: qty 1

## 2012-07-26 MED ORDER — LISINOPRIL-HYDROCHLOROTHIAZIDE 20-12.5 MG PO TABS: 2.0000 | ORAL_TABLET | Freq: Every day | ORAL | Status: DC

## 2012-07-26 MED ORDER — HYDROCHLOROTHIAZIDE 12.5 MG PO CAPS: 12.5000 mg | ORAL_CAPSULE | Freq: Every day | ORAL | Status: DC

## 2012-07-26 MED ORDER — ASPIRIN 81 MG PO CHEW
81.0000 mg | CHEWABLE_TABLET | Freq: Every day | ORAL | Status: DC
  Administered 2012-07-26 – 2012-07-29 (×4): 81 mg via ORAL
  Filled 2012-07-26 (×4): qty 1

## 2012-07-26 MED ORDER — ONDANSETRON HCL 4 MG/2ML IJ SOLN
4.0000 mg | Freq: Once | INTRAMUSCULAR | Status: AC
  Administered 2012-07-26: 4 mg via INTRAVENOUS
  Filled 2012-07-26: qty 2

## 2012-07-26 MED ORDER — DEXTROSE 5 % IV SOLN
1.0000 g | INTRAVENOUS | Status: DC
  Administered 2012-07-27 – 2012-07-29 (×3): 1 g via INTRAVENOUS
  Filled 2012-07-26 (×4): qty 10

## 2012-07-26 MED ORDER — PRO-STAT SUGAR FREE PO LIQD
30.0000 mL | Freq: Every day | ORAL | Status: DC
  Administered 2012-07-26: 30 mL via ORAL
  Filled 2012-07-26 (×2): qty 30

## 2012-07-26 MED ORDER — IBUPROFEN 800 MG PO TABS
400.0000 mg | ORAL_TABLET | Freq: Four times a day (QID) | ORAL | Status: DC
  Administered 2012-07-26: 400 mg via ORAL
  Filled 2012-07-26 (×2): qty 1

## 2012-07-26 MED ORDER — HYDROMORPHONE HCL PF 1 MG/ML IJ SOLN
1.0000 mg | INTRAMUSCULAR | Status: DC | PRN
  Administered 2012-07-26 – 2012-07-27 (×5): 1 mg via INTRAVENOUS
  Filled 2012-07-26 (×5): qty 1

## 2012-07-26 MED ORDER — AZITHROMYCIN 250 MG PO TABS
500.0000 mg | ORAL_TABLET | Freq: Once | ORAL | Status: AC
  Administered 2012-07-26: 500 mg via ORAL
  Filled 2012-07-26: qty 2

## 2012-07-26 MED ORDER — LISINOPRIL 10 MG PO TABS
40.0000 mg | ORAL_TABLET | Freq: Every day | ORAL | Status: DC
Start: 2012-07-26 — End: 2012-07-29
  Administered 2012-07-26 – 2012-07-29 (×4): 40 mg via ORAL
  Filled 2012-07-26 (×4): qty 4

## 2012-07-26 MED ORDER — ACETAMINOPHEN 500 MG PO TABS: 500.0000 mg | ORAL_TABLET | Freq: Four times a day (QID) | ORAL | Status: DC | PRN

## 2012-07-26 MED ORDER — METOCLOPRAMIDE HCL 5 MG/ML IJ SOLN
10.0000 mg | Freq: Once | INTRAMUSCULAR | Status: AC
  Administered 2012-07-26: 10 mg via INTRAVENOUS
  Filled 2012-07-26: qty 2

## 2012-07-26 MED ORDER — OXYCODONE HCL 5 MG PO TABS
5.0000 mg | ORAL_TABLET | ORAL | Status: DC | PRN
  Administered 2012-07-26: 5 mg via ORAL
  Filled 2012-07-26: qty 1

## 2012-07-26 MED ORDER — NICOTINE 21 MG/24HR TD PT24: 21.0000 mg | MEDICATED_PATCH | Freq: Every day | TRANSDERMAL | Status: DC | PRN

## 2012-07-26 MED ORDER — ENOXAPARIN SODIUM 40 MG/0.4ML ~~LOC~~ SOLN
40.0000 mg | SUBCUTANEOUS | Status: DC
  Administered 2012-07-26 – 2012-07-29 (×4): 40 mg via SUBCUTANEOUS
  Filled 2012-07-26 (×5): qty 0.4

## 2012-07-26 MED ORDER — ONDANSETRON HCL 4 MG/2ML IJ SOLN
4.0000 mg | INTRAMUSCULAR | Status: DC | PRN
  Administered 2012-07-28: 4 mg via INTRAVENOUS
  Filled 2012-07-26: qty 2

## 2012-07-26 MED ORDER — TAMSULOSIN HCL 0.4 MG PO CAPS
0.4000 mg | ORAL_CAPSULE | Freq: Every day | ORAL | Status: DC
  Administered 2012-07-26 – 2012-07-29 (×4): 0.4 mg via ORAL
  Filled 2012-07-26 (×4): qty 1

## 2012-07-26 MED ORDER — FLEET ENEMA 7-19 GM/118ML RE ENEM: 1.0000 | ENEMA | Freq: Every day | RECTAL | Status: DC | PRN

## 2012-07-26 MED ORDER — METHIMAZOLE 5 MG PO TABS
10.0000 mg | ORAL_TABLET | Freq: Every day | ORAL | Status: DC
  Administered 2012-07-26 – 2012-07-29 (×4): 10 mg via ORAL
  Filled 2012-07-26: qty 2
  Filled 2012-07-26: qty 1
  Filled 2012-07-26 (×3): qty 2

## 2012-07-26 MED ORDER — ALPRAZOLAM 0.5 MG PO TABS
0.5000 mg | ORAL_TABLET | Freq: Three times a day (TID) | ORAL | Status: DC | PRN
  Administered 2012-07-27 – 2012-07-29 (×6): 0.5 mg via ORAL
  Filled 2012-07-26 (×6): qty 1

## 2012-07-26 MED ORDER — POTASSIUM CHLORIDE IN NACL 20-0.9 MEQ/L-% IV SOLN
INTRAVENOUS | Status: DC
  Administered 2012-07-26 – 2012-07-29 (×7): via INTRAVENOUS

## 2012-07-26 NOTE — ED Notes (Signed)
Pt c/o headache x 3 days. Pt also c/o right lower back pain that radiates up and down right side of his back. Pt also c/o dysuria.

## 2012-07-26 NOTE — Progress Notes (Signed)
INITIAL NUTRITION ASSESSMENT  DOCUMENTATION CODES Per approved criteria  -Not Applicable   INTERVENTION: Provided general healthy diet education  Add ProStat 30 ml daily  NUTRITION DIAGNOSIS: None at this time  Goal: Pt to meet >/= 90% of their estimated nutrition needs; met  Monitor:  Oral intake meals, supplements, wt changes and labs  Reason for Assessment: Malnutrition Screen  62 y.o. male  Admitting Dx: Viral syndrome  ASSESSMENT: Hx of constipation and chronic narcotic use. Pt reports hx of wt loss due to declining health but wt has remained somewhat stable past 14 months. He lives with spouse and eats 3 meals daily and also takes supplements MVI, Vitamin C and A, Fish Oil and Chondroitin daily. He does not meet criteria for malnutrition at this time.  Height: Ht Readings from Last 1 Encounters:  07/26/12 5\' 8"  (1.727 m)    Weight: Wt Readings from Last 1 Encounters:  07/26/12 135 lb 9.3 oz (61.5 kg)    Ideal Body Weight: 135# (61.3 kg)  % Ideal Body Weight: 100%  Wt Readings from Last 10 Encounters:  07/26/12 135 lb 9.3 oz (61.5 kg)  02/04/12 130 lb (58.968 kg)  11/20/11 132 lb (59.875 kg)  10/28/11 141 lb (63.957 kg)  09/30/11 140 lb (63.504 kg)  08/24/11 140 lb (63.504 kg)  05/01/11 141 lb 9.6 oz (64.229 kg)    Usual Body Weight: ~130-140# past 14 months  % Usual Body Weight: 100%  BMI:  Body mass index is 20.62 kg/(m^2). Normal range  Estimated Nutritional Needs: Kcal: 1860-2170 Protein: 68-74 gr Fluid: >1800 ml/d  Skin: no issues noted  Diet Order: Cardiac  EDUCATION NEEDS: -Education needs addressed  No intake or output data in the 24 hours ending 07/26/12 1323  Last BM: 07/24/12  Labs:   Lab 07/26/12 0400  NA 126*  K 3.7  CL 89*  CO2 24  BUN 22  CREATININE 0.78  CALCIUM 8.9  MG --  PHOS --  GLUCOSE 126*    CBG (last 3)  No results found for this basename: GLUCAP:3 in the last 72 hours  Scheduled Meds:   .  aspirin  81 mg Oral Daily  . azithromycin  500 mg Intravenous Q24H  . cefTRIAXone (ROCEPHIN)  IV  1 g Intravenous Q24H  . DULoxetine  60 mg Oral BID  . enoxaparin (LOVENOX) injection  40 mg Subcutaneous Q24H  . guaiFENesin  1,200 mg Oral BID  . ibuprofen  400 mg Oral QID  . lisinopril  40 mg Oral Daily  . methimazole  10 mg Oral Daily  . Tamsulosin HCl  0.4 mg Oral Daily    Continuous Infusions:   . 0.9 % NaCl with KCl 20 mEq / L 125 mL/hr at 07/26/12 0901    Past Medical History  Diagnosis Date  . Hyperthyroidism   . Chronic back pain     chronic narcotic use  . Degenerative disc disease   . Carpal tunnel syndrome   . Arthritis   . Hypertension   . Chronic hepatitis C 1980    never had any treatment  . CVA (cerebral infarction) 06/2010  . Anxiety   . Depression   . UTI (lower urinary tract infection)   . Stroke 2012    left hemiplegia    Past Surgical History  Procedure Date  . Hand surgery     right  . Neck surgery   . Right hand reconstruction     [phone]

## 2012-07-26 NOTE — Progress Notes (Signed)
[facility] PROGRESS NOTE  EUAN WANDLER ZOX:[id number] DOB: [birth year] DOA: 07/26/2012 PCP: MUSE,ROCHELLE D., PA  Assessment/Plan: 1. CAP. Max temp 99.4 VSS non-toxic appearing. Continue Zithromax and rocephin day #1. Strep pneumon and legionella antigen pending as is sputum culture. Flu PCR neg. Will discontinue tamiflu. CT pending.  2. Dehydration/hypnatremia likely related to decreased po intake. Will hold HCTZ for now.  Will continue IV fluids at 125/hr. Will monitor closely. bmet in am.  3. Intractable headache: etiology uncertain. May be related to #1 and #2. Little improvement this am. Will modify pain med to q 3hrs. 4. HTN: only fair control likely related to pain. Will continue ACE and monitor BP closely given that HCTZ being held due to #2. If becomes uncontrolled will add prn BB. 5. Leukocytosis: related to #1 and pt reports having taken steroids pre admission. Ill appearing but non-toxic. Zithromax and rocephin day #1. Urine culture pending. 6. Hyperthyroidism: TSH and free T4 pending. Will continue home regime 7. Chronic back pain: describes flank pain. CT abdomen/pelvis pending given slight hematuria as well. Continue home pain meds but change to every 3hr prn. Monitor 8. Tobacco use: counseled regarding cessation. Continue nicotine patch.   Code Status: full Family Communication: wife at bedside Disposition Plan: home when ready hopefully couple days   Consultants:  none  Procedures:  none  Antibiotics:  zithromax 07/26/12>>>>  Rocephin 07/26/12>>>  HPI/Subjective: Lying in bed holding head in hands complains of no improvement in headache. States "you doctors are not doing anything for me!" denies any other discomfort.   Objective: Filed Vitals:   07/26/12 0327 07/26/12 0517 07/26/12 0606 07/26/12 0906  BP: 146/79 145/84 151/91 158/85  Pulse: 88 84 77 92  Temp: 98.5 F (36.9 C) 98.8 F (37.1 C) 99.4 F (37.4 C)   TempSrc: Oral Oral Oral   Resp: 48 20  20   Height: 5\' 8"  (1.727 m)  5\' 8"  (1.727 m)   Weight: 58.968 kg (130 lb)  61.5 kg (135 lb 9.3 oz)   SpO2: 97% 93% 98% 95%   No intake or output data in the 24 hours ending 07/26/12 0932 Filed Weights   07/26/12 0327 07/26/12 0606  Weight: 58.968 kg (130 lb) 61.5 kg (135 lb 9.3 oz)    Exam:   General:  Awake alert ill appearing but non-toxic  Cardiovascular: RRR No MGR No [name]   Respiratory: Normal effort BS with mild rhonchi in bases otherwise clear to auscultation bilaterally. No wheeze  Abdomen: flat soft +BS non-tender to palpation.  Data Reviewed: Basic Metabolic Panel:  Lab 07/26/12 8119  NA 126*  K 3.7  CL 89*  CO2 24  GLUCOSE 126*  BUN 22  CREATININE 0.78  CALCIUM 8.9  MG --  PHOS --   Liver Function Tests: No results found for this basename: AST:5,ALT:5,ALKPHOS:5,BILITOT:5,PROT:5,ALBUMIN:5 in the last 168 hours No results found for this basename: LIPASE:5,AMYLASE:5 in the last 168 hours No results found for this basename: AMMONIA:5 in the last 168 hours CBC:  Lab 07/26/12 0603 07/26/12 0400  WBC 20.3* 20.2*  NEUTROABS -- 17.4*  HGB 14.2 15.0  HCT 39.2 40.9  MCV 90.7 91.1  PLT 140* 145*   Cardiac Enzymes: No results found for this basename: CKTOTAL:5,CKMB:5,CKMBINDEX:5,TROPONINI:5 in the last 168 hours BNP (last 3 results) No results found for this basename: PROBNP:3 in the last 8760 hours CBG: No results found for this basename: GLUCAP:5 in the last 168 hours  No results found for this or any  previous visit (from the past 240 hour(s)).   Studies: Dg Chest Portable 1 View  07/26/2012  *RADIOLOGY REPORT*  Clinical Data: Cough  PORTABLE CHEST - 1 VIEW  Comparison: 02/04/2012  Findings: Mild left greater than right infrahilar opacity. Cardiomediastinal contours otherwise within normal range. Mild linear right mid lung opacity, favor atelectasis.  No pleural effusion or pneumothorax.  No acute osseous finding.  IMPRESSION: Mild left greater than right  infrahilar opacities may reflect early infiltrate.  Recommend follow-up with PA and lateral views to better characterize.   Original Report Authenticated By: Jearld Lesch, M.D.     Scheduled Meds:   . aspirin  81 mg Oral Daily  . azithromycin  500 mg Intravenous Q24H  . cefTRIAXone (ROCEPHIN)  IV  1 g Intravenous Q24H  . DULoxetine  60 mg Oral BID  . enoxaparin (LOVENOX) injection  40 mg Subcutaneous Q24H  . guaiFENesin  1,200 mg Oral BID  . lisinopril  40 mg Oral Daily   And  . hydrochlorothiazide  25 mg Oral Daily  . ibuprofen  400 mg Oral QID  . methimazole  10 mg Oral Daily  . oseltamivir  75 mg Oral BID  . Tamsulosin HCl  0.4 mg Oral Daily   Continuous Infusions:   . 0.9 % NaCl with KCl 20 mEq / L 125 mL/hr at 07/26/12 0901        Time spent: 35 minutes    [facility] M  [facility]  If 8PM-8AM, please contact night-coverage at [email or web address], password [facility] 07/26/2012, 9:32 AM  LOS: 0 days     Attending note:  Patient seen and examined.  Headache resolved after receiving dilaudid.  Likely source of infection is urinary tract.  Doubt that the patient has pneumonia.  Continue current antibiotics for now. Follow up labs in am.

## 2012-07-26 NOTE — ED Notes (Signed)
Attempted to give report 

## 2012-07-26 NOTE — Progress Notes (Signed)
Pt and pt's wife report that patient has had increased memory loss over the past years.  They report it has been noticeably worse over the past two months.  Patient sees Dr. Loleta Chance at the [place] in [place].  Dr. Kerry Hough paged.

## 2012-07-26 NOTE — Progress Notes (Signed)
ANTIBIOTIC CONSULT NOTE - INITIAL  Pharmacy Consult for Zithromax and Rocephin Indication: rule out pneumonia  Allergies  Allergen Reactions  . Morphine And Related Nausea And Vomiting    IV only    Patient Measurements: Height: 5\' 8"  (172.7 cm) Weight: 135 lb 9.3 oz (61.5 kg) IBW/kg (Calculated) : 68.4   Vital Signs: Temp: 99.4 F (37.4 C) (01/27 0606) Temp src: Oral (01/27 0606) BP: 151/91 mmHg (01/27 0606) Pulse Rate: 77  (01/27 0606) Intake/Output from previous day:   Intake/Output from this shift:    Labs:  Basename 07/26/12 0603 07/26/12 0400  WBC 20.3* 20.2*  HGB 14.2 15.0  PLT 140* 145*  LABCREA -- --  CREATININE -- 0.78   Estimated Creatinine Clearance: 83.3 ml/min (by C-G formula based on Cr of 0.78). No results found for this basename: VANCOTROUGH:2,VANCOPEAK:2,VANCORANDOM:2,GENTTROUGH:2,GENTPEAK:2,GENTRANDOM:2,TOBRATROUGH:2,TOBRAPEAK:2,TOBRARND:2,AMIKACINPEAK:2,AMIKACINTROU:2,AMIKACIN:2, in the last 72 hours   Microbiology: No results found for this or any previous visit (from the past 720 hour(s)).  Medical History: Past Medical History  Diagnosis Date  . Hyperthyroidism   . Chronic back pain     chronic narcotic use  . Degenerative disc disease   . Carpal tunnel syndrome   . Arthritis   . Hypertension   . Chronic hepatitis C 1980    never had any treatment  . CVA (cerebral infarction) 06/2010  . Anxiety   . Depression   . UTI (lower urinary tract infection)   . Stroke 2012    left hemiplegia    Medications:  Scheduled:    . aspirin  81 mg Oral Daily  . azithromycin  500 mg Intravenous Q24H  . [COMPLETED] azithromycin  500 mg Oral Once  . [COMPLETED] cefTRIAXone (ROCEPHIN)  IV  1 g Intravenous Once  . cefTRIAXone (ROCEPHIN)  IV  1 g Intravenous Q24H  . [COMPLETED] diphenhydrAMINE  25 mg Intravenous Once  . DULoxetine  60 mg Oral BID  . enoxaparin (LOVENOX) injection  40 mg Subcutaneous Q24H  . guaiFENesin  1,200 mg Oral BID  .  lisinopril  40 mg Oral Daily   And  . hydrochlorothiazide  25 mg Oral Daily  . [COMPLETED]  HYDROmorphone (DILAUDID) injection  1 mg Intravenous Once  . ibuprofen  400 mg Oral QID  . methimazole  10 mg Oral Daily  . [COMPLETED] metoCLOPramide (REGLAN) injection  10 mg Intravenous Once  . [COMPLETED] ondansetron (ZOFRAN) IV  4 mg Intravenous Once  . oseltamivir  75 mg Oral BID  . Tamsulosin HCl  0.4 mg Oral Daily  . [DISCONTINUED] hydrochlorothiazide  12.5 mg Oral Daily  . [DISCONTINUED] lisinopril  20 mg Oral Daily  . [DISCONTINUED] lisinopril-hydrochlorothiazide  2 tablet Oral Daily   Assessment: 62yo male admitted with headache, fever, cough, SOB and leukocytosis.  Likely CAP.  Has good renal fxn.  Estimated Creatinine Clearance: 83.3 ml/min (by C-G formula based on Cr of 0.78).  Goal of Therapy:  Eradicate infection.  Plan: Continue current Rx No adjustment needed at this time. F/U labs, renal fxn and cultures per protocol  Valrie Hart A 07/26/2012,7:53 AM

## 2012-07-26 NOTE — H&P (Signed)
[facility] History and Physical  Eric Tran  ZOX:[id number]  DOB: 1951/02/27   DOA: 07/26/2012   PCP:   Dr. Al Corpus, [facility]; Dr. Loleta Chance, [facility]  Chief Complaint:  Body pain and sick for 2 weeks  HPI: Eric Tran is an 62 y.o. male.  Caucasian gentleman with multiple complicated medical problems, gets his healthcare through the [facility]; he has early hepatitis C which is pending definitive treatment now that his hyperthyroidism is well controlled. He has chronic pains, he has anxiety and panic attacks for which she takes when necessary Xanax.  For the past 2 weeks patient has been ill prominently back pains, fever and chills and shortness of breath nasal congestion photophobia, and a nonproductive cough. He has been treating himself prominently with his wife is aspirin-caffeine-barbiturates combination, does not seek medical attention. In the past few days the coughing and body aches have gotten much worse and he came to the emergency room for assistance.  Prominent symptoms seem to be headache and back pains, he notes that the tamsulosin which she takes for his BPH has "not been working" for the past week, and he has developed frequency, dysuria and urge incontinence.  He has not taken her flu shot.  His white count is elevated in the emergency room, but in fact he is self medicated with 3 doses of prednisone for a skin problem, " because I knows prednisone helped skin problems" Wrist x-ray in the emergency room shows atelectasis versus infiltrate  Rewiew of Systems:   All systems negative except as marked bold or noted in the HPI;  Constitutional: malaise, fever and chills. ;  Eyes: eye pain, redness and discharge. ; Photophobia ENMT: ear pain, hoarseness, nasal congestion, sinus pressure and sore throat. ;  Cardiovascular: chest pain, palpitations, diaphoresis, dyspnea and peripheral edema. ;  Respiratory: cough, hemoptysis, wheezing and stridor. ;    Gastrointestinal: nausea, vomiting, diarrhea, constipation, abdominal pain, melena, blood in stool, hematemesis, jaundice and rectal bleeding. unusual weight loss..  Genitourinary: frequency, dysuria, urge incontinence,flank pain and hematuria;  Musculoskeletal: back pain and neck pain. swelling and trauma.;  Skin: . pruritus, rash, abrasions, bruising and skin lesion.; ulcerations  Neuro: headache, lightheadedness and neck stiffness. weakness, altered level of consciousness , altered mental status, extremity weakness, burning feet, involuntary movement, seizure and syncope.  Psych: anxiety, depression, insomnia, tearfulness, panic attacks, hallucinations, paranoia, suicidal or homicidal ideation     Past Medical History  Diagnosis Date  . Hyperthyroidism   . Chronic back pain     chronic narcotic use  . Degenerative disc disease   . Carpal tunnel syndrome   . Arthritis   . Hypertension   . Chronic hepatitis C 1980    never had any treatment  . CVA (cerebral infarction) 06/2010  . Anxiety   . Depression   . UTI (lower urinary tract infection)   . Stroke 2012    left hemiplegia    Past Surgical History  Procedure Date  . Hand surgery     right  . Neck surgery   . Right hand reconstruction     Medications:  HOME MEDS: Prior to Admission medications   Medication Sig Start Date End Date Taking? Authorizing Provider  ALPRAZolam Prudy Feeler) 0.5 MG tablet Take 0.5 mg by mouth 3 (three) times daily as needed. For sleep and anxiety    Historical Provider, MD  aspirin 81 MG tablet Take 81 mg by mouth daily.     Historical Provider,  MD  DULoxetine (CYMBALTA) 60 MG capsule Take 60 mg by mouth 2 (two) times daily.     Historical Provider, MD  fish oil-omega-3 fatty acids 1000 MG capsule Take 2 g by mouth daily.    Historical Provider, MD  Glucosamine-Chondroitin (GLUCOSAMINE CHONDR COMPLEX PO) Take 2 tablets by mouth daily.    Historical Provider, MD  HYDROcodone-acetaminophen (NORCO)  5-325 MG per tablet Take 1 tablet by mouth every 6 (six) hours as needed. For pain     Historical Provider, MD  levETIRAcetam (KEPPRA) 500 MG tablet Take 1 tablet (500 mg total) by mouth every 12 (twelve) hours. 02/04/12 02/03/13  Shelda Jakes, MD  lisinopril-hydrochlorothiazide (PRINZIDE,ZESTORETIC) 20-12.5 MG per tablet Take 2 tablets by mouth daily.     Historical Provider, MD  methimazole (TAPAZOLE) 10 MG tablet Take 10 mg by mouth daily.    Historical Provider, MD  OVER THE COUNTER MEDICATION Take 1 capsule by mouth as needed. STIFF NIGHTS (sexual male enhancement)    Historical Provider, MD  oxycodone (OXY-IR) 5 MG capsule Take 5 mg by mouth 4 (four) times daily as needed. For pain    Historical Provider, MD  Tamsulosin HCl (FLOMAX) 0.4 MG CAPS Take 0.4 mg by mouth daily.     Historical Provider, MD     Allergies:  Allergies  Allergen Reactions  . Morphine And Related Nausea And Vomiting    IV only    Social History:   reports that he has been smoking Cigarettes.  He has a 45 pack-year smoking history. He has never used smokeless tobacco. He reports that he drinks alcohol. He reports that he uses illicit drugs (Marijuana).  Family History: Family History  Problem Relation Age of Onset  . Colon cancer Neg Hx   . Colon polyps Neg Hx   . Cervical cancer Mother   . Lung cancer Sister   . Breast cancer Sister   . Heart failure Father      Physical Exam: Filed Vitals:   07/26/12 0327 07/26/12 0517 07/26/12 0606  BP: 146/79 145/84 151/91  Pulse: 88 84 77  Temp: 98.5 F (36.9 C) 98.8 F (37.1 C) 99.4 F (37.4 C)  TempSrc: Oral Oral Oral  Resp: 48 20 20  Height: 5\' 8"  (1.727 m)  5\' 8"  (1.727 m)  Weight: 58.968 kg (130 lb)  61.5 kg (135 lb 9.3 oz)  SpO2: 97% 93% 98%   Blood pressure 151/91, pulse 77, temperature 99.4 F (37.4 C), temperature source Oral, resp. rate 20, height 5\' 8"  (1.727 m), weight 61.5 kg (135 lb 9.3 oz), SpO2 98.00%.  GEN:  Very anxious thin  middle-aged Caucasian gentleman lying in the bed; history stress seems mostly anxiety related; on command he is able to hold his breath without difficulty; PSYCH:  alert and oriented x4; anxious affect is appropriate. HEENT: Mucous membranes pink and anicteric; PERRLA; EOM intact; no cervical lymphadenopathy nor thyromegaly or carotid bruit; no JVD; Breasts:: Not examined CHEST WALL: No tenderness CHEST: Normal respiration, clear to auscultation bilaterally HEART: Regular rate and rhythm; no murmurs rubs or gallops BACK: No kyphosis or scoliosis;  ABDOMEN:, soft non-tender; no masses, no organomegaly, normal abdominal bowel sounds; no pannus; no intertriginous candida. Rectal Exam: Not done EXTREMITIES:  age-appropriate arthropathy of the hands and knees; no edema; no ulcerations. Genitalia: not examined PULSES: 2+ and symmetric SKIN: Normal hydration no rash or ulceration CNS: Cranial nerves 2-12 grossly intact no focal lateralizing neurologic deficit   Labs on Admission:  Basic  Metabolic Panel:  Lab 07/26/12 4782  NA 126*  K 3.7  CL 89*  CO2 24  GLUCOSE 126*  BUN 22  CREATININE 0.78  CALCIUM 8.9  MG --  PHOS --   Liver Function Tests: No results found for this basename: AST:5,ALT:5,ALKPHOS:5,BILITOT:5,PROT:5,ALBUMIN:5 in the last 168 hours No results found for this basename: LIPASE:5,AMYLASE:5 in the last 168 hours No results found for this basename: AMMONIA:5 in the last 168 hours CBC:  Lab 07/26/12 0400  WBC 20.2*  NEUTROABS 17.4*  HGB 15.0  HCT 40.9  MCV 91.1  PLT 145*   Cardiac Enzymes: No results found for this basename: CKTOTAL:5,CKMB:5,CKMBINDEX:5,TROPONINI:5 in the last 168 hours BNP: No components found with this basename: POCBNP:5 D-dimer: No components found with this basename: D-DIMER:5 CBG: No results found for this basename: GLUCAP:5 in the last 168 hours  Radiological Exams on Admission: Dg Chest Portable 1 View  07/26/2012  *RADIOLOGY REPORT*   Clinical Data: Cough  PORTABLE CHEST - 1 VIEW  Comparison: 02/04/2012  Findings: Mild left greater than right infrahilar opacity. Cardiomediastinal contours otherwise within normal range. Mild linear right mid lung opacity, favor atelectasis.  No pleural effusion or pneumothorax.  No acute osseous finding.  IMPRESSION: Mild left greater than right infrahilar opacities may reflect early infiltrate.  Recommend follow-up with PA and lateral views to better characterize.   Original Report Authenticated By: Jearld Lesch, M.D.     Assessment/Plan Present on Admission:  . CAP (community acquired pneumonia) . Dehydration . Intractable headache . Viral syndrome . Chronic hepatitis C . Chronic pain . Anxiety disorder . Flank pain, acute . Hyperthyroidism . Tobacco abuse   PLAN: Will continue treatment for hospital-acquired pneumonia but get plain CT scan of the better characterize lung infiltrates. At the same time will get plain CT scan of the abdomen and pelvis to rule out a kidney stone, since predominant complaint is right flank pain radiating into the groin, and he has microscopic hematuria.  Because of his history of viral-like, we'll check influenza PCR and empirically start Tamiflu.  Hydrate and continue management of his chronic medical conditions including hypothyroidism and anxiety disorder.  Will often nicotine replacement for his tobacco abuse  Other plans as per orders.  Code Status: FULL CODE  Family Communication: Wife present at bedside or interview examination and discussion of plans Disposition Plan: Probably home when more stable    Nabiha Planck Nocturnist [facility] Pager [phone]   07/26/2012, 7:05 AM

## 2012-07-26 NOTE — ED Provider Notes (Addendum)
History     CSN: [id number]  Arrival date & time 07/26/12  0311   First MD Initiated Contact with Patient 07/26/12 0321      Chief Complaint  Patient presents with  . Headache     Patient is a 62 y.o. male presenting with headaches. The history is provided by the patient.  Headache  This is a new problem. The current episode started more than 2 days ago. The problem occurs constantly. The problem has been gradually worsening. The headache is associated with nothing. The pain is moderate. The pain does not radiate. Associated symptoms include a fever, shortness of breath, nausea and vomiting. Treatments tried: rest. The treatment provided no relief.  pt presents for multiple complaints He reports HA for several days - started about 4 days ago and gradually worsening.  Reports fever at home yesterday (101F per family), but no fever today No focal weakness.  He does report nausea/vomiting.   He also reports cough/congestion He also reports low back pain as well.  He also reports dysuria.   He reports he gets headaches once or twice a year but this episode is worse than normal. No new weakness is reported.  No new visual changes.  No CP is reported.    Past Medical History  Diagnosis Date  . Hyperthyroidism   . Chronic back pain     chronic narcotic use  . Degenerative disc disease   . Carpal tunnel syndrome   . Arthritis   . Hypertension   . Chronic hepatitis C 1980    never had any treatment  . CVA (cerebral infarction) 06/2010  . Anxiety   . Depression   . UTI (lower urinary tract infection)   . Stroke     Past Surgical History  Procedure Date  . Hand surgery     right  . Neck surgery   . Right hand reconstruction     Family History  Problem Relation Age of Onset  . Colon cancer Neg Hx   . Colon polyps Neg Hx   . Cervical cancer Mother   . Lung cancer Sister   . Breast cancer Sister   . Heart failure Father     History  Substance Use Topics  . Smoking  status: Current Every Day Smoker -- 1.0 packs/day for 45 years    Types: Cigarettes  . Smokeless tobacco: Never Used  . Alcohol Use: Yes     Comment: couple beers/shots of Ree Kida or Lynwood Dawley couple times per month; heavy etoh x couple yrs 40y ago      Review of Systems  Constitutional: Positive for fever.  Respiratory: Positive for cough and shortness of breath.   Cardiovascular: Negative for chest pain.  Gastrointestinal: Positive for nausea and vomiting.  Neurological: Positive for headaches. Negative for weakness.  Psychiatric/Behavioral: Negative for agitation.  All other systems reviewed and are negative.    Allergies  Morphine and related  Home Medications   Current Outpatient Rx  Name  Route  Sig  Dispense  Refill  . ALPRAZOLAM 0.5 MG PO TABS   Oral   Take 0.5 mg by mouth 3 (three) times daily as needed. For sleep and anxiety         . ASPIRIN 81 MG PO TABS   Oral   Take 81 mg by mouth daily.          . DULOXETINE HCL 60 MG PO CPEP   Oral   Take 60 mg by mouth 2 (  two) times daily.          . OMEGA-3 FATTY ACIDS 1000 MG PO CAPS   Oral   Take 2 g by mouth daily.         Marland Kitchen GLUCOSAMINE CHONDR COMPLEX PO   Oral   Take 2 tablets by mouth daily.         Marland Kitchen HYDROCODONE-ACETAMINOPHEN 5-325 MG PO TABS   Oral   Take 1 tablet by mouth every 6 (six) hours as needed. For pain          . LEVETIRACETAM 500 MG PO TABS   Oral   Take 1 tablet (500 mg total) by mouth every 12 (twelve) hours.   28 tablet   1   . LISINOPRIL-HYDROCHLOROTHIAZIDE 20-12.5 MG PO TABS   Oral   Take 2 tablets by mouth daily.          Marland Kitchen METHIMAZOLE 10 MG PO TABS   Oral   Take 10 mg by mouth daily.         Marland Kitchen OVER THE COUNTER MEDICATION   Oral   Take 1 capsule by mouth as needed. STIFF NIGHTS (sexual male enhancement)         . OXYCODONE HCL 5 MG PO CAPS   Oral   Take 5 mg by mouth 4 (four) times daily as needed. For pain         . TAMSULOSIN HCL 0.4 MG PO CAPS   Oral    Take 0.4 mg by mouth daily.            BP 146/79  Pulse 88  Temp 98.5 F (36.9 C) (Oral)  Resp 48  Ht 5\' 8"  (1.727 m)  Wt 130 lb (58.968 kg)  BMI 19.77 kg/m2  SpO2 97%  Physical Exam CONSTITUTIONAL: Well developed/well nourished, anxious HEAD AND FACE: Normocephalic/atraumatic EYES: EOMI/PERRL ENMT: Mucous membranes dry NECK: supple no meningeal signs SPINE:entire spine nontender CV: S1/S2 noted, no murmurs/rubs/gallops noted LUNGS: coarse breath sounds noted bilaterally.  Tachypnea noted.  He is able to speak to me clearly ABDOMEN: soft, nontender, no rebound or guarding GU:no cva tenderness NEURO: Awake/alert, facies symmetric, no arm or leg drift is noted Cranial nerves 3/4/5/6/01/05/09/11/12 tested and intact No past pointing Extremities - no edema or calf tenderness Psych - anxious   ED Course  Procedures   Labs Reviewed  BASIC METABOLIC PANEL - Abnormal; Notable for the following:    Sodium 126 (*)     Chloride 89 (*)     Glucose, Bld 126 (*)     All other components within normal limits  CBC WITH DIFFERENTIAL - Abnormal; Notable for the following:    WBC 20.2 (*)     MCHC 36.7 (*)     Platelets 145 (*)     Neutrophils Relative 86 (*)     Neutro Abs 17.4 (*)     Lymphocytes Relative 5 (*)     Monocytes Absolute 1.8 (*)     All other components within normal limits  URINALYSIS, ROUTINE W REFLEX MICROSCOPIC - Abnormal; Notable for the following:    Glucose, UA 100 (*)     Hgb urine dipstick LARGE (*)     Bilirubin Urine SMALL (*)     Protein, ur 100 (*)     Urobilinogen, UA 4.0 (*)     Leukocytes, UA TRACE (*)     All other components within normal limits  URINE MICROSCOPIC-ADD ON - Abnormal; Notable for the following:  Bacteria, UA FEW (*)     All other components within normal limits  URINE CULTURE   Dg Chest Portable 1 View  07/26/2012  *RADIOLOGY REPORT*  Clinical Data: Cough  PORTABLE CHEST - 1 VIEW  Comparison: 02/04/2012  Findings: Mild  left greater than right infrahilar opacity. Cardiomediastinal contours otherwise within normal range. Mild linear right mid lung opacity, favor atelectasis.  No pleural effusion or pneumothorax.  No acute osseous finding.  IMPRESSION: Mild left greater than right infrahilar opacities may reflect early infiltrate.  Recommend follow-up with PA and lateral views to better characterize.   Original Report Authenticated By: Jearld Lesch, M.D.    4:50 AM Pt here for multiple complaints - back pain, headache and cough.  He has previous h/o CVA per records but he has no obvious residual deficit on my exam.  It is reported in the chart that he has had seizures previously but no recent seizures reported per patient/family.  Will follow closely 5:25 AM On repeat exam, pt still with cough and tachypnea though this is improving.  Given xray findings, his lung exam (bilateral crackles on repeat exam) with cough/tachypnea, this is likely community acquired pneumonia.  I doubt meningitis at this time.  Defer neuroimaging for now and will admit for treatment of his pneumonia (pt denies any recent hospitalizations and he lives at home)  MDM  Nursing notes including past medical history and social history reviewed and considered in documentation xrays reviewed and considered Labs/vital reviewed and considered Previous records reviewed and considered         Joya Gaskins, MD 07/26/12 2841  Joya Gaskins, MD 07/26/12 0530

## 2012-07-26 NOTE — Care Management Note (Unsigned)
    Page 1 of 1   07/27/2012     2:03:48 PM   CARE MANAGEMENT NOTE 07/27/2012  Patient:  Eric Tran, Eric Tran   Account Number:  [id number]  Date Initiated:  07/26/2012  Documentation initiated by:  Rosemary Holms  Subjective/Objective Assessment:   Pt admitted from home where he lives with his wife. Wife at bedside and states that all of his specialists are at the [place] in [place]. His PCP is in [facility].     Action[facility]Plan:   [facility] and left messages on April's Voice mail.   Anticipated DC Date:  07/28/2012   Anticipated DC Plan:  HOME/SELF CARE      DC Planning Services  CM consult      Choice offered to / List presented to:             Status of service:  In process, will continue to follow Medicare Important Message given?   (If response is "NO", the following Medicare IM given date fields will be blank) Date Medicare IM given:   Date Additional Medicare IM given:    Discharge Disposition:    Per UR Regulation:    If discussed at Long Length of Stay Meetings, dates discussed:    Comments:  07/27/12 Rosemary Holms RN BSN CM Called [facility] [place] two times today, 9:00 and at 2:00, message left for April to return call. Both times LOS was mentioned to be greater than 24/48 hours. No return call.  07/26/12 Rosemary Holms RN BSN CM

## 2012-07-27 ENCOUNTER — Inpatient Hospital Stay (HOSPITAL_COMMUNITY): Payer: Non-veteran care

## 2012-07-27 ENCOUNTER — Encounter (HOSPITAL_COMMUNITY): Payer: Self-pay | Admitting: Radiology

## 2012-07-27 DIAGNOSIS — E0590 Thyrotoxicosis, unspecified without thyrotoxic crisis or storm: Secondary | ICD-10-CM

## 2012-07-27 DIAGNOSIS — N12 Tubulo-interstitial nephritis, not specified as acute or chronic: Principal | ICD-10-CM

## 2012-07-27 LAB — BASIC METABOLIC PANEL
BUN: 27 mg/dL — ABNORMAL HIGH (ref 6–23)
CO2: 23 mEq/L (ref 19–32)
Calcium: 8.4 mg/dL (ref 8.4–10.5)
Chloride: 97 mEq/L (ref 96–112)
Creatinine, Ser: 0.81 mg/dL (ref 0.50–1.35)
GFR calc Af Amer: >90 mL/min (ref >90)
GFR calc non Af Amer: >90 mL/min (ref >90)
Glucose, Bld: 113 mg/dL — ABNORMAL HIGH (ref 70–99)
Potassium: 3.8 mEq/L (ref 3.5–5.1)
Sodium: 130 mEq/L — ABNORMAL LOW (ref 135–145)

## 2012-07-27 LAB — CBC
HCT: 38 % — ABNORMAL LOW (ref 39.0–52.0)
Hemoglobin: 13.7 g/dL (ref 13.0–17.0)
MCH: 32.9 pg (ref 26.0–34.0)
MCHC: 36.1 g/dL — ABNORMAL HIGH (ref 30.0–36.0)
MCV: 91.3 fL (ref 78.0–100.0)
Platelets: 127 10*3/uL — ABNORMAL LOW (ref 150–400)
RBC: 4.16 MIL/uL — ABNORMAL LOW (ref 4.22–5.81)
RDW: 13.1 % (ref 11.5–15.5)
WBC: 19.2 10*3/uL — ABNORMAL HIGH (ref 4.0–10.5)

## 2012-07-27 MED ORDER — IOHEXOL 300 MG/ML  SOLN
100.0000 mL | Freq: Once | INTRAMUSCULAR | Status: AC | PRN
  Administered 2012-07-27: 100 mL via INTRAVENOUS

## 2012-07-27 MED ORDER — IOHEXOL 300 MG/ML  SOLN
50.0000 mL | Freq: Once | INTRAMUSCULAR | Status: AC | PRN
  Administered 2012-07-27: 50 mL via ORAL

## 2012-07-27 MED ORDER — HYDROMORPHONE HCL PF 1 MG/ML IJ SOLN
1.0000 mg | INTRAMUSCULAR | Status: DC | PRN
  Administered 2012-07-27 – 2012-07-29 (×13): 1 mg via INTRAVENOUS
  Filled 2012-07-27 (×14): qty 1

## 2012-07-27 NOTE — Progress Notes (Signed)
[facility] PROGRESS NOTE  Eric Tran ZOX:[id number] DOB: 27-Oct-1950 DOA: 07/26/2012 PCP: MUSE,ROCHELLE D., PA  Assessment/Plan: 1. Pylonephritis: Ct abdomen yields right pyelonephritis without evidence of renal abcess or hydronephrosis.   Afebrile.  VSS non-toxic appearing. Discontinue  Zithromax and continue rocephin day #2. Strep pneumon and legionella antigen in process. Flu PCR neg. Will discontinue tamiflu. Blood cultures no growth to date.   2. Dehydration/hyponatremia likely related to decreased po intake. Sodium trending up. Will continue to hold HCTZ for now. Will continue IV fluids at 100/hr. Will monitor closely. bmet in am.  3. Intractable headache: etiology uncertain. Some improvement when dilaudid started yesterday but worsened during night. Wife reports hx of seizure and "black outs" and memory loss starting in July 2013. Pt seen by Dr. Loleta Chance neurologist at [facility] and OP workup in progress. Will get MRI and request neurology consult 4. HTN: fair control likely related to pain. SBP range 147-150. Will continue ACE and continue to hold HCTZ being held due to #2. Monitor 5. Leukocytosis: related to #1 and pt reports having taken steroids pre admission. Trending downward slightly. Somewhat  Ill appearing but non-toxic.  Urine culture pending. 6. Hyperthyroidism: TSH and free T4 WNL. Will continue home regime 7. Chronic back pain: describes flank pain. CT abdomen/pelvis right pylonephritis. Continue home pain meds but change to every 3hr prn. Monitor 8. Tobacco use: counseled regarding cessation. Continue nicotine patch.      Code Status: full Family Communication: wife at bedside Disposition Plan: home with wife when ready   Consultants:  neuro  Procedures:  none  Antibiotics:  Zithromax 07/26/12 - 07/27/12  Rocephin 07/26/12 >>>  HPI/Subjective: Lying in bed moaning. Complained of persistent worsening headache and insufficient pain medicine.   Objective: Filed  Vitals:   07/26/12 1444 07/26/12 2247 07/27/12 0613 07/27/12 0736  BP: 119/87 150/85 147/86   Pulse: 85  68   Temp: 97.6 F (36.4 C) 98.5 F (36.9 C)    TempSrc: Oral Oral Oral   Resp: 20 20 20    Height:      Weight:    65.3 kg (143 lb 15.4 oz)  SpO2: 97% 97% 93%     Intake/Output Summary (Last 24 hours) at 07/27/12 0959 Last data filed at 07/27/12 0900  Gross per 24 hour  Intake 3492.92 ml  Output    700 ml  Net 2792.92 ml   Filed Weights   07/26/12 0327 07/26/12 0606 07/27/12 0736  Weight: 58.968 kg (130 lb) 61.5 kg (135 lb 9.3 oz) 65.3 kg (143 lb 15.4 oz)    Exam:   General:  Awake alert NAD  Cardiovascular: RRR No MGR No [name] PPP  Respiratory: normal effort BS coarse bilaterally but clear. No wheeze/crackles  Abdomen: soft non-distended. +BS non-tender to palpation  Data Reviewed: Basic Metabolic Panel:  Lab 07/27/12 8119 07/26/12 0400  NA 130* 126*  K 3.8 3.7  CL 97 89*  CO2 23 24  GLUCOSE 113* 126*  BUN 27* 22  CREATININE 0.81 0.78  CALCIUM 8.4 8.9  MG -- --  PHOS -- --   Liver Function Tests: No results found for this basename: AST:5,ALT:5,ALKPHOS:5,BILITOT:5,PROT:5,ALBUMIN:5 in the last 168 hours No results found for this basename: LIPASE:5,AMYLASE:5 in the last 168 hours No results found for this basename: AMMONIA:5 in the last 168 hours CBC:  Lab 07/27/12 0447 07/26/12 0603 07/26/12 0400  WBC 19.2* 20.3* 20.2*  NEUTROABS -- -- 17.4*  HGB 13.7 14.2 15.0  HCT 38.0* 39.2 40.9  MCV 91.3 90.7 91.1  PLT 127* 140* 145*   Cardiac Enzymes: No results found for this basename: CKTOTAL:5,CKMB:5,CKMBINDEX:5,TROPONINI:5 in the last 168 hours BNP (last 3 results) No results found for this basename: PROBNP:3 in the last 8760 hours CBG: No results found for this basename: GLUCAP:5 in the last 168 hours  Recent Results (from the past 240 hour(s))  CULTURE, BLOOD (ROUTINE X 2)     Status: Normal (Preliminary result)   Collection Time   07/26/12  6:04  AM      Component Value Range Status Comment   Specimen Description Blood   Final    Special Requests NONE   Final    Culture NO GROWTH <24 HRS   Final    Report Status PENDING   Incomplete   CULTURE, BLOOD (ROUTINE X 2)     Status: Normal (Preliminary result)   Collection Time   07/26/12  6:12 AM      Component Value Range Status Comment   Specimen Description Blood   Final    Special Requests NONE   Final    Culture NO GROWTH <24 HRS   Final    Report Status PENDING   Incomplete      Studies: Ct Abdomen Pelvis Wo Contrast  07/26/2012  *RADIOLOGY REPORT*  Clinical Data:  Abnormal chest x-ray with right side flank pain and coughing.  Bodyaches.  Hepatitis C.  CT CHEST, ABDOMEN AND PELVIS WITHOUT CONTRAST  Technique:  Multidetector CT imaging of the chest, abdomen and pelvis was performed following the standard protocol without IV contrast.  Comparison:  Chest x-ray from earlier today.  CT CHEST  Findings: The no axillary lymphadenopathy.  No mediastinal or hilar lymphadenopathy.  Heart size is normal.  No pericardial or pleural effusion.  Lung windows demonstrate emphysema bilaterally.  There is some atelectasis in the right base.  No evidence for pneumonia.  No parenchymal nodule or mass.  Bone windows show nonacute posterior left lower rib fractures.  IMPRESSION: Right base atelectasis.  Otherwise no acute findings.  CT ABDOMEN AND PELVIS  Findings:  No focal abnormalities seen in the liver or spleen on this study performed without intravenous contrast material. Liver is enlarged at 18 cm in cranial caudal length.  Stomach is decompressed.  Duodenum, pancreas, and gallbladder are unremarkable.  No adrenal mass.  The right kidney appears enlarged and demonstrates perinephric edema the.  No hydronephrosis in either kidney.  No renal stones.  No abdominal aortic aneurysm.  No definite abdominal lymphadenopathy although the lack of oral and IV contrast limits sensitivity for detection.  Imaging  through the pelvis shows a small amount of intraperitoneal free fluid.  There is no pelvic sidewall lymphadenopathy.  Bladder is not distended.  No colonic diverticulitis. The appendix is not visualized, but there is no edema or inflammation in the region of the cecum.  Terminal ileum is normal.  Bone windows reveal no worrisome lytic or sclerotic osseous lesions.  IMPRESSION: Right kidney appears enlarged and edematous with perinephric edema/inflammation.  There is some fluid tracks down the retroperitoneal space and right pericolic gutter and in the pelvis. Etiology for these findings is not clear, but renal infection, urinary neoplasm, or recent right-sided urinary stone passage would all be considerations. If renal function allows, repeat imaging after IV contrast administration may prove helpful.  Hepatomegaly.   Original Report Authenticated By: Kennith Center, M.D.    Ct Chest Wo Contrast  07/26/2012  *RADIOLOGY REPORT*  Clinical Data:  Abnormal chest  x-ray with right side flank pain and coughing.  Bodyaches.  Hepatitis C.  CT CHEST, ABDOMEN AND PELVIS WITHOUT CONTRAST  Technique:  Multidetector CT imaging of the chest, abdomen and pelvis was performed following the standard protocol without IV contrast.  Comparison:  Chest x-ray from earlier today.  CT CHEST  Findings: The no axillary lymphadenopathy.  No mediastinal or hilar lymphadenopathy.  Heart size is normal.  No pericardial or pleural effusion.  Lung windows demonstrate emphysema bilaterally.  There is some atelectasis in the right base.  No evidence for pneumonia.  No parenchymal nodule or mass.  Bone windows show nonacute posterior left lower rib fractures.  IMPRESSION: Right base atelectasis.  Otherwise no acute findings.  CT ABDOMEN AND PELVIS  Findings:  No focal abnormalities seen in the liver or spleen on this study performed without intravenous contrast material. Liver is enlarged at 18 cm in cranial caudal length.  Stomach is decompressed.   Duodenum, pancreas, and gallbladder are unremarkable.  No adrenal mass.  The right kidney appears enlarged and demonstrates perinephric edema the.  No hydronephrosis in either kidney.  No renal stones.  No abdominal aortic aneurysm.  No definite abdominal lymphadenopathy although the lack of oral and IV contrast limits sensitivity for detection.  Imaging through the pelvis shows a small amount of intraperitoneal free fluid.  There is no pelvic sidewall lymphadenopathy.  Bladder is not distended.  No colonic diverticulitis. The appendix is not visualized, but there is no edema or inflammation in the region of the cecum.  Terminal ileum is normal.  Bone windows reveal no worrisome lytic or sclerotic osseous lesions.  IMPRESSION: Right kidney appears enlarged and edematous with perinephric edema/inflammation.  There is some fluid tracks down the retroperitoneal space and right pericolic gutter and in the pelvis. Etiology for these findings is not clear, but renal infection, urinary neoplasm, or recent right-sided urinary stone passage would all be considerations. If renal function allows, repeat imaging after IV contrast administration may prove helpful.  Hepatomegaly.   Original Report Authenticated By: Kennith Center, M.D.    Ct Abdomen Pelvis W Contrast  07/27/2012  *RADIOLOGY REPORT*  Clinical Data: Back and flank pain.  Nausea.  Chronic hepatitis C.  CT ABDOMEN AND PELVIS WITH CONTRAST  Technique:  Multidetector CT imaging of the abdomen and pelvis was performed following the standard protocol during bolus administration of intravenous contrast.  Contrast:  OMNIPAQUE IOHEXOL 300 MG/ML  SOLN  Comparison: Noncontrast CT on 07/26/2012  Findings: Diffuse enlargement right kidney is seen with heterogeneous pattern of contrast enhancement seen throughout the renal parenchyma, rather than a single vascular distribution. There is soft tissue stranding in the right perinephric space with minimal fluid extending  inferiorly along the retroperitoneal fat planes.  The right renal artery and vein are patent.  There is no evidence of renal mass or hydronephrosis.  These findings are consistent with pyelonephritis.  The left kidney is normal in appearance.  A small amount of free fluid is seen in the pelvic cul-de-sac.  No other inflammatory process or abscess identified.  No evidence of bowel wall thickening or dilatation.  No soft tissue masses or lymphadenopathy identified within the abdomen or pelvis.  The liver, gallbladder, spleen, pancreas, and adrenal glands are normal appearance.  Mild bibasilar atelectasis again noted.  IMPRESSION:  1.  Right pyelonephritis.  No evidence of renal abscess or hydronephrosis. 2.  Mild bibasilar atelectasis.   Original Report Authenticated By: Myles Rosenthal, M.D.    Dg  Chest Portable 1 View  07/26/2012  *RADIOLOGY REPORT*  Clinical Data: Cough  PORTABLE CHEST - 1 VIEW  Comparison: 02/04/2012  Findings: Mild left greater than right infrahilar opacity. Cardiomediastinal contours otherwise within normal range. Mild linear right mid lung opacity, favor atelectasis.  No pleural effusion or pneumothorax.  No acute osseous finding.  IMPRESSION: Mild left greater than right infrahilar opacities may reflect early infiltrate.  Recommend follow-up with PA and lateral views to better characterize.   Original Report Authenticated By: Jearld Lesch, M.D.     Scheduled Meds:   . aspirin  81 mg Oral Daily  . cefTRIAXone (ROCEPHIN)  IV  1 g Intravenous Q24H  . DULoxetine  60 mg Oral BID  . enoxaparin (LOVENOX) injection  40 mg Subcutaneous Q24H  . feeding supplement  30 mL Oral Q2000  . guaiFENesin  1,200 mg Oral BID  . lisinopril  40 mg Oral Daily  . methimazole  10 mg Oral Daily  . Tamsulosin HCl  0.4 mg Oral Daily   Continuous Infusions:   . 0.9 % NaCl with KCl 20 mEq / L 125 mL/hr at 07/27/12 0104    Principal Problem:  *Viral syndrome Active Problems:  Chronic hepatitis C   CAP (community acquired pneumonia)  Dehydration  Intractable headache  Chronic pain  Anxiety disorder  Flank pain, acute  Hyperthyroidism  Tobacco abuse  Hyponatremia  Pyelonephritis    Time spent: 35 minutes    [facility] M  [facility]  If 8PM-8AM, please contact night-coverage at [email or web address], password [facility] 07/27/2012, 9:59 AM  LOS: 1 day     Attending note:  Patient seen and examined.  Above note reviewed.   Biggest issue for patient at this time is his intractable headache. Will request records from his neurologist at the [place].  Will request a neurology consultation.  MRI brain is unremarkable. Continue treatment for pyelonephritis, f/u urine culture.  Defer need for LP to neuro.  Continue pain management.

## 2012-07-27 NOTE — Progress Notes (Signed)
UR Chart Review Completed  

## 2012-07-27 NOTE — Progress Notes (Signed)
Pt is complaining of severe headaches. I've given him oxy IR, but pain is only relieved with dilaudid. Wife states that she did not report to Dr. Orvan Falconer her husband's memory loss, blackouts, seizures, and vision changes when he was admitted to the floor. [facility]ole[facility], but appointment was cancelled by [facility] and rescheduled for May. Pt and wife want to know if this could be investigated further.

## 2012-07-27 NOTE — Plan of Care (Signed)
Problem: Phase I Progression Outcomes Goal: Pain controlled with appropriate interventions Outcome: Not Progressing Still having severe HA - IV Dilaudid working for short time - will address with MD

## 2012-07-28 DIAGNOSIS — E860 Dehydration: Secondary | ICD-10-CM

## 2012-07-28 DIAGNOSIS — G8929 Other chronic pain: Secondary | ICD-10-CM

## 2012-07-28 LAB — URINE CULTURE
Colony Count: >=100000
Colony Count: NO GROWTH
Culture: NO GROWTH

## 2012-07-28 LAB — LEGIONELLA ANTIGEN, URINE: Legionella Antigen, Urine: NEGATIVE

## 2012-07-28 LAB — BASIC METABOLIC PANEL
BUN: 22 mg/dL (ref 6–23)
CO2: 25 mEq/L (ref 19–32)
Calcium: 8.2 mg/dL — ABNORMAL LOW (ref 8.4–10.5)
Chloride: 98 mEq/L (ref 96–112)
Creatinine, Ser: 0.71 mg/dL (ref 0.50–1.35)
GFR calc Af Amer: >90 mL/min (ref >90)
GFR calc non Af Amer: >90 mL/min (ref >90)
Glucose, Bld: 108 mg/dL — ABNORMAL HIGH (ref 70–99)
Potassium: 3.8 mEq/L (ref 3.5–5.1)
Sodium: 129 mEq/L — ABNORMAL LOW (ref 135–145)

## 2012-07-28 LAB — STREP PNEUMONIAE URINARY ANTIGEN: Strep Pneumo Urinary Antigen: NEGATIVE

## 2012-07-28 LAB — CBC
HCT: 37.5 % — ABNORMAL LOW (ref 39.0–52.0)
Hemoglobin: 13.4 g/dL (ref 13.0–17.0)
MCH: 32.5 pg (ref 26.0–34.0)
MCHC: 35.7 g/dL (ref 30.0–36.0)
MCV: 91 fL (ref 78.0–100.0)
Platelets: 129 10*3/uL — ABNORMAL LOW (ref 150–400)
RBC: 4.12 MIL/uL — ABNORMAL LOW (ref 4.22–5.81)
RDW: 13 % (ref 11.5–15.5)
WBC: 14.4 10*3/uL — ABNORMAL HIGH (ref 4.0–10.5)

## 2012-07-28 LAB — SEDIMENTATION RATE: Sed Rate: 56 mm/hr — ABNORMAL HIGH (ref 0–16)

## 2012-07-28 MED ORDER — IBUPROFEN 800 MG PO TABS
400.0000 mg | ORAL_TABLET | Freq: Four times a day (QID) | ORAL | Status: DC
  Administered 2012-07-28 – 2012-07-29 (×4): 400 mg via ORAL
  Filled 2012-07-28 (×5): qty 1

## 2012-07-28 MED ORDER — SUMATRIPTAN SUCCINATE 6 MG/0.5ML ~~LOC~~ SOLN
6.0000 mg | SUBCUTANEOUS | Status: DC | PRN
  Administered 2012-07-28 (×2): 6 mg via SUBCUTANEOUS
  Filled 2012-07-28: qty 0.5

## 2012-07-28 MED ORDER — METOCLOPRAMIDE HCL 5 MG/ML IJ SOLN
10.0000 mg | Freq: Once | INTRAMUSCULAR | Status: AC
  Administered 2012-07-28: 10 mg via INTRAVENOUS
  Filled 2012-07-28: qty 2

## 2012-07-28 MED ORDER — OXYCODONE HCL 5 MG PO TABS
10.0000 mg | ORAL_TABLET | ORAL | Status: DC | PRN
  Administered 2012-07-28 – 2012-07-29 (×2): 10 mg via ORAL
  Filled 2012-07-28 (×3): qty 2

## 2012-07-28 NOTE — Progress Notes (Signed)
ANTIBIOTIC CONSULT NOTE  Pharmacy Consult for Rocephin Indication: rule out pneumonia  Allergies  Allergen Reactions  . Morphine And Related Nausea And Vomiting    IV only    Patient Measurements: Height: 5\' 8"  (172.7 cm) Weight: 143 lb 15.4 oz (65.3 kg) IBW/kg (Calculated) : 68.4   Vital Signs: Temp: 97.3 F (36.3 C) (01/29 0452) Temp src: Oral (01/29 0452) BP: 136/78 mmHg (01/29 0452) Pulse Rate: 69  (01/29 0452) Intake/Output from previous day: 01/28 0701 - 01/29 0700 In: 3293.8 [P.O.:840; I.V.:2453.8] Out: 800 [Urine:800] Intake/Output from this shift: Total I/O In: -  Out: 400 [Urine:400]  Labs:  [facility] 07/28/12 0541 07/27/12 0447 07/26/12 0603 07/26/12 0400  WBC 14.4* 19.2* 20.3* --  HGB 13.4 13.7 14.2 --  PLT 129* 127* 140* --  LABCREA -- -- -- --  CREATININE 0.71 0.81 -- 0.78   Estimated Creatinine Clearance: 88.4 ml/min (by C-G formula based on Cr of 0.71). No results found for this basename: VANCOTROUGH:2,VANCOPEAK:2,VANCORANDOM:2,GENTTROUGH:2,GENTPEAK:2,GENTRANDOM:2,TOBRATROUGH:2,TOBRAPEAK:2,TOBRARND:2,AMIKACINPEAK:2,AMIKACINTROU:2,AMIKACIN:2, in the last 72 hours   Microbiology: Recent Results (from the past 720 hour(s))  URINE CULTURE     Status: Normal (Preliminary result)   Collection Time   07/26/12  3:55 AM      Component Value Range Status Comment   Specimen Description URINE, CLEAN CATCH   Final    Special Requests NONE   Final    Culture  Setup Time 07/26/2012 15:42   Final    Colony Count >=100,000 COLONIES/ML   Final    Culture ESCHERICHIA COLI   Final    Report Status PENDING   Incomplete   CULTURE, BLOOD (ROUTINE X 2)     Status: Normal (Preliminary result)   Collection Time   07/26/12  6:04 AM      Component Value Range Status Comment   Specimen Description BLOOD LEFT ARM   Final    Special Requests BOTTLES DRAWN AEROBIC AND ANAEROBIC 12CC   Final    Culture NO GROWTH 1 DAY   Final    Report Status PENDING   Incomplete   CULTURE,  BLOOD (ROUTINE X 2)     Status: Normal (Preliminary result)   Collection Time   07/26/12  6:12 AM      Component Value Range Status Comment   Specimen Description BLOOD LEFT HAND   Final    Special Requests BOTTLES DRAWN AEROBIC AND ANAEROBIC 12CC   Final    Culture NO GROWTH 1 DAY   Final    Report Status PENDING   Incomplete     Medical History: Past Medical History  Diagnosis Date  . Hyperthyroidism   . Chronic back pain     chronic narcotic use  . Degenerative disc disease   . Carpal tunnel syndrome   . Arthritis   . Hypertension   . Chronic hepatitis C 1980    never had any treatment  . CVA (cerebral infarction) 06/2010  . Anxiety   . Depression   . UTI (lower urinary tract infection)   . Stroke 2012    left hemiplegia    Medications:  Scheduled:     . aspirin  81 mg Oral Daily  . cefTRIAXone (ROCEPHIN)  IV  1 g Intravenous Q24H  . DULoxetine  60 mg Oral BID  . enoxaparin (LOVENOX) injection  40 mg Subcutaneous Q24H  . feeding supplement  30 mL Oral Q2000  . guaiFENesin  1,200 mg Oral BID  . lisinopril  40 mg Oral Daily  . methimazole  10 mg Oral Daily  . Tamsulosin HCl  0.4 mg Oral Daily  . [DISCONTINUED] azithromycin  500 mg Intravenous Q24H   Assessment: 62yo M on day#3 Rocephin for CAP.  Also +Ecoli UTI (sensitivities pending).   Rocephin is not renally adjusted. Dose is appropriate for renal function and indications.   Goal of Therapy:  Eradicate infection.  Plan: Continue current Rx Duration per MD- recommend 7-10 days of antibiotics. Change to po as appropriate.  Pharmacy to sign off. Please re-consult as necessary.   Elson Clan 07/28/2012,8:50 AM

## 2012-07-28 NOTE — Progress Notes (Signed)
[facility] PROGRESS NOTE  Eric Tran ZOX:[id number] DOB: 14-Nov-1950 DOA: 07/26/2012 PCP: MUSE,ROCHELLE D., PA  Assessment/Plan: 1. E coli Pylonephritis: Ct abdomen yields right pyelonephritis without evidence of renal abcess or hydronephrosis.   Afebrile.  VSS non-toxic appearing. On rocephin. Blood cultures no growth to date.   2. Dehydration/hyponatremia likely related to decreased po intake. Sodium trending up. Will continue to hold HCTZ for now. Will continue IV fluids at 100/hr. Will monitor closely. bmet in am.  3. Intractable headache: etiology uncertain, possibly migraine. Patient reports having "black outs", memory loss and seizures for quite some time now. MRI brain was unremarkable.  He has been receiving narcotics for pain relief. Will give trial of migraine therapy with reglan, ibuprofen and sumatriptan.  Will also check ESR. Neurology consult has been ordered, await recommendations.  4. HTN: fair control likely related to pain. SBP range 147-150. Will continue ACE and continue to hold HCTZ being held due to #2. Monitor 5. Leukocytosis: related to #1 and pt reports having taken steroids pre admission. Trending downward slightly. Somewhat  Ill appearing but non-toxic.  Urine culture pending. 6. Hyperthyroidism: TSH and free T4 WNL. Will continue home regime 7. Chronic back pain: describes flank pain. CT abdomen/pelvis right pylonephritis. Continue home pain meds but change to every 3hr prn. Monitor 8. Tobacco use: counseled regarding cessation. Continue nicotine patch.      Code Status: full Family Communication: wife at bedside Disposition Plan: home with wife when ready   Consultants:  neuro  Procedures:  none  Antibiotics:  Zithromax 07/26/12 - 07/27/12  Rocephin 07/26/12 >>>  HPI/Subjective: Laying in bed. Complaining of severe headache.  Worse with light and loud noise. Tearful from pain.  Objective: Filed Vitals:   07/27/12 2219 07/28/12 0452 07/28/12  1018 07/28/12 1446  BP: 152/82 136/78 178/49 173/97  Pulse: 65 69 75 70  Temp: 98 F (36.7 C) 97.3 F (36.3 C) 98 F (36.7 C) 97.4 F (36.3 C)  TempSrc: Oral Oral Oral Oral  Resp: 20 20 20 18   Height:      Weight:      SpO2: 98% 95% 94% 93%    Intake/Output Summary (Last 24 hours) at 07/28/12 1525 Last data filed at 07/28/12 1400  Gross per 24 hour  Intake 3053.75 ml  Output   1300 ml  Net 1753.75 ml   Filed Weights   07/26/12 0327 07/26/12 0606 07/27/12 0736  Weight: 58.968 kg (130 lb) 61.5 kg (135 lb 9.3 oz) 65.3 kg (143 lb 15.4 oz)    Exam:   General:  Awake, alert, tearful  Cardiovascular: RRR No MGR No [name] PPP  Respiratory: normal effort BS coarse bilaterally but clear. No wheeze/crackles  Abdomen: soft non-distended. +BS non-tender to palpation  Data Reviewed: Basic Metabolic Panel:  Lab 07/28/12 8119 07/27/12 0447 07/26/12 0400  NA 129* 130* 126*  K 3.8 3.8 3.7  CL 98 97 89*  CO2 25 23 24   GLUCOSE 108* 113* 126*  BUN 22 27* 22  CREATININE 0.71 0.81 0.78  CALCIUM 8.2* 8.4 8.9  MG -- -- --  PHOS -- -- --   Liver Function Tests: No results found for this basename: AST:5,ALT:5,ALKPHOS:5,BILITOT:5,PROT:5,ALBUMIN:5 in the last 168 hours No results found for this basename: LIPASE:5,AMYLASE:5 in the last 168 hours No results found for this basename: AMMONIA:5 in the last 168 hours CBC:  Lab 07/28/12 0541 07/27/12 0447 07/26/12 0603 07/26/12 0400  WBC 14.4* 19.2* 20.3* 20.2*  NEUTROABS -- -- -- 17.4*  HGB 13.4 13.7 14.2 15.0  HCT 37.5* 38.0* 39.2 40.9  MCV 91.0 91.3 90.7 91.1  PLT 129* 127* 140* 145*   Cardiac Enzymes: No results found for this basename: CKTOTAL:5,CKMB:5,CKMBINDEX:5,TROPONINI:5 in the last 168 hours BNP (last 3 results) No results found for this basename: PROBNP:3 in the last 8760 hours CBG: No results found for this basename: GLUCAP:5 in the last 168 hours  Recent Results (from the past 240 hour(s))  URINE CULTURE     Status:  Normal   Collection Time   07/26/12  3:55 AM      Component Value Range Status Comment   Specimen Description URINE, CLEAN CATCH   Final    Special Requests NONE   Final    Culture  Setup Time 07/26/2012 15:42   Final    Colony Count >=100,000 COLONIES/ML   Final    Culture ESCHERICHIA COLI   Final    Report Status 07/28/2012 FINAL   Final    Organism ID, Bacteria ESCHERICHIA COLI   Final   CULTURE, BLOOD (ROUTINE X 2)     Status: Normal (Preliminary result)   Collection Time   07/26/12  6:04 AM      Component Value Range Status Comment   Specimen Description BLOOD LEFT ARM   Final    Special Requests BOTTLES DRAWN AEROBIC AND ANAEROBIC 12CC   Final    Culture NO GROWTH 2 DAYS   Final    Report Status PENDING   Incomplete   CULTURE, BLOOD (ROUTINE X 2)     Status: Normal (Preliminary result)   Collection Time   07/26/12  6:12 AM      Component Value Range Status Comment   Specimen Description BLOOD LEFT HAND   Final    Special Requests BOTTLES DRAWN AEROBIC AND ANAEROBIC 12CC   Final    Culture NO GROWTH 2 DAYS   Final    Report Status PENDING   Incomplete      Studies: Mr Brain Wo Contrast  07/27/2012  *RADIOLOGY REPORT*  Clinical Data: Headache with memory loss. Hypertension.  Hepatitis C.  Prior stroke.  MRI HEAD WITHOUT CONTRAST  Technique:  Multiplanar, multiecho pulse sequences of the brain and surrounding structures were obtained according to standard protocol without intravenous contrast.  Comparison: 02/04/2012 CT.  07/19/2010 MR.  Findings: Motion degraded exam.  No acute infarct.  Remote right thalamic infarct.  Remote right paracentral pontine infarct.  Tiny remote basal ganglia lacunar infarct.  No intracranial mass lesion detected on this unenhanced exam.  Mild atrophy without hydrocephalus.  No intracranial hemorrhage.  Major intracranial vascular structures are patent.  Minimal paranasal sinus mucosal thickening.  Partially empty sella.  Slightly heterogeneous bone marrow  may be normal for this patient versus changes related to underlying anemia.  IMPRESSION:  Motion degraded exam.  No acute infarct.  Remote right thalamic infarct.  Remote right paracentral pontine infarct.  Tiny remote basal ganglia lacunar infarct.  Mild atrophy without hydrocephalus.  Minimal paranasal sinus mucosal thickening.  Partially empty sella.  Slightly heterogeneous bone marrow may be normal for this patient versus changes related to underlying anemia.   Original Report Authenticated By: Lacy Duverney, M.D.    Ct Abdomen Pelvis W Contrast  07/27/2012  *RADIOLOGY REPORT*  Clinical Data: Back and flank pain.  Nausea.  Chronic hepatitis C.  CT ABDOMEN AND PELVIS WITH CONTRAST  Technique:  Multidetector CT imaging of the abdomen and pelvis was performed following the standard protocol during bolus  administration of intravenous contrast.  Contrast:  OMNIPAQUE IOHEXOL 300 MG/ML  SOLN  Comparison: Noncontrast CT on 07/26/2012  Findings: Diffuse enlargement right kidney is seen with heterogeneous pattern of contrast enhancement seen throughout the renal parenchyma, rather than a single vascular distribution. There is soft tissue stranding in the right perinephric space with minimal fluid extending inferiorly along the retroperitoneal fat planes.  The right renal artery and vein are patent.  There is no evidence of renal mass or hydronephrosis.  These findings are consistent with pyelonephritis.  The left kidney is normal in appearance.  A small amount of free fluid is seen in the pelvic cul-de-sac.  No other inflammatory process or abscess identified.  No evidence of bowel wall thickening or dilatation.  No soft tissue masses or lymphadenopathy identified within the abdomen or pelvis.  The liver, gallbladder, spleen, pancreas, and adrenal glands are normal appearance.  Mild bibasilar atelectasis again noted.  IMPRESSION:  1.  Right pyelonephritis.  No evidence of renal abscess or hydronephrosis. 2.  Mild  bibasilar atelectasis.   Original Report Authenticated By: Myles Rosenthal, M.D.     Scheduled Meds:    . aspirin  81 mg Oral Daily  . cefTRIAXone (ROCEPHIN)  IV  1 g Intravenous Q24H  . DULoxetine  60 mg Oral BID  . enoxaparin (LOVENOX) injection  40 mg Subcutaneous Q24H  . feeding supplement  30 mL Oral Q2000  . guaiFENesin  1,200 mg Oral BID  . ibuprofen  400 mg Oral QID  . lisinopril  40 mg Oral Daily  . methimazole  10 mg Oral Daily  . Tamsulosin HCl  0.4 mg Oral Daily   Continuous Infusions:    . 0.9 % NaCl with KCl 20 mEq / L 100 mL/hr at 07/28/12 4782    Principal Problem:  *Viral syndrome Active Problems:  Chronic hepatitis C  CAP (community acquired pneumonia)  Dehydration  Intractable headache  Chronic pain  Anxiety disorder  Flank pain, acute  Hyperthyroidism  Tobacco abuse  Hyponatremia  Pyelonephritis    Time spent: 35 minutes    Season Astacio  [facility]  If 8PM-8AM, please contact night-coverage at [email or web address], password [facility] 07/28/2012, 3:25 PM  LOS: 2 days

## 2012-07-28 NOTE — Consult Note (Signed)
[facility] Shera Laubach A. Gerilyn Pilgrim, MD     [email or web address]          Eric Tran is an 62 y.o. male.   ASSESSMENT/PLAN: Intractable headaches. I suspect that the patient  most likely has a parameningeal processes causing irritation of the meninges due to the pyelonephritis. Intracranial infection seems unlikely but probably should be investigated given the persistent headaches. The patient probably is prone to have headaches as he did have some headaches when I saw him several years ago. I would suggest that we give the patient Toradol for pain relief. He did respond to Imitrex. This may be use also on as-needed basis. Opioids can be utilized as needed but as a last resort. We probably should go ahead and arrange for the patient to undergo a spinal tap for further evaluation.  Chronic pain syndrome.  History of opioid abuse.  The patient is a 62 year old white male who I saw in the past several years ago for chronic pain. He was being treated for chronic neck pain and back pain. He did complain of headache on occasion  during the time that I did see the patient. The patient reports that about 4 days before presenting to the hospital. He did have a 12 oz bottle of beer to drink and went to bed afterwards. He denies drinking any other social alcohol or denied using any illicit drugs. The patient woke up with severe right flank pain and also bifrontal headache. He reports that at that time the flank pain was actually quite severe and more intense then the bifrontal headache. He does endorse having some fevers chills and body aches. A couple days afterwards. The patient reports that the flank pain subside but then the more prominent complaints and to be bifrontal headaches which radiate to the occipital area. The patient does report having nausea and vomiting twice during this illness. He denies any focal neurological deficits. He denies chest pain shows of breath. He does report that his  urine color has been darker than usual.  The patient has been given medication in the hospital for his headaches but the medicines have not been beneficial. He actually just got a shot of Imitrex and this appears to have been very effective with total resolution of his headaches. We will see how long this lasts however.  GENERAL: This is a pleasant thin man who is sleeping. He is awake and will with light sternal rub. He does recognize me from previous encounters.  HEENT: Neck is nice and supple. Head is normocephalic and atraumatic.  ABDOMEN: soft  EXTREMITIES: No edema. There is no Kernig signs noted.   BACK: Unremarkable.  SKIN: Normal by inspection.    MENTAL STATUS: Alert and oriented. Speech, language and cognition are generally intact. Judgment and insight normal.   CRANIAL NERVES: Pupils are equal, round and reactive to light and accomodation; extra ocular movements are full, there is no significant nystagmus; visual fields are full; upper and lower facial muscles are normal in strength and symmetric, there is no flattening of the nasolabial folds; tongue is midline; uvula is midline; shoulder elevation is normal.  MOTOR: Normal tone, bulk and strength; no pronator drift.  COORDINATION: Left finger to nose is normal, right finger to nose is normal, No rest tremor; no intention tremor; no postural tremor; no bradykinesia.  REFLEXES: Deep tendon reflexes are symmetrical and normal throughout except the right patella which is diminished. The patient reports that this is a problem he has had  for a long time. Plantar responses are flexor bilaterally.   SENSATION: Normal to light touch and temperature.      Past Medical History  Diagnosis Date  . Hyperthyroidism   . Chronic back pain     chronic narcotic use  . Degenerative disc disease   . Carpal tunnel syndrome   . Arthritis   . Hypertension   . Chronic hepatitis C 1980    never had any treatment  . CVA (cerebral  infarction) 06/2010  . Anxiety   . Depression   . UTI (lower urinary tract infection)   . Stroke 2012    left hemiplegia    Past Surgical History  Procedure Date  . Hand surgery     right  . Neck surgery   . Right hand reconstruction     Family History  Problem Relation Age of Onset  . Colon cancer Neg Hx   . Colon polyps Neg Hx   . Cervical cancer Mother   . Lung cancer Sister   . Breast cancer Sister   . Heart failure Father     Social History:  reports that he has been smoking Cigarettes.  He has a 45 pack-year smoking history. He has never used smokeless tobacco. He reports that he drinks alcohol. He reports that he uses illicit drugs (Marijuana).  Allergies:  Allergies  Allergen Reactions  . Morphine And Related Nausea And Vomiting    IV only    Medications:  Prior to Admission medications   Medication Sig Start Date End Date Taking? Authorizing Provider  ALPRAZolam Prudy Feeler) 0.5 MG tablet Take 0.5 mg by mouth 3 (three) times daily as needed. For sleep and anxiety   Yes Historical Provider, MD  aspirin 81 MG tablet Take 81 mg by mouth daily.    Yes Historical Provider, MD  DULoxetine (CYMBALTA) 60 MG capsule Take 60 mg by mouth 2 (two) times daily.    Yes Historical Provider, MD  fish oil-omega-3 fatty acids 1000 MG capsule Take 2 g by mouth daily.   Yes Historical Provider, MD  Glucosamine-Chondroitin (GLUCOSAMINE CHONDR COMPLEX PO) Take 2 tablets by mouth daily.   Yes Historical Provider, MD  HYDROcodone-acetaminophen (NORCO) 5-325 MG per tablet Take 1 tablet by mouth every 6 (six) hours as needed. For pain    Yes Historical Provider, MD  lisinopril-hydrochlorothiazide (PRINZIDE,ZESTORETIC) 20-12.5 MG per tablet Take 2 tablets by mouth daily.    Yes Historical Provider, MD  methimazole (TAPAZOLE) 10 MG tablet Take 10 mg by mouth daily.   Yes Historical Provider, MD  OVER THE COUNTER MEDICATION Take 1 capsule by mouth as needed. STIFF NIGHTS (sexual male enhancement)    Yes Historical Provider, MD  Tamsulosin HCl (FLOMAX) 0.4 MG CAPS Take 0.4 mg by mouth daily.    Yes Historical Provider, MD    Scheduled Meds:   . aspirin  81 mg Oral Daily  . cefTRIAXone (ROCEPHIN)  IV  1 g Intravenous Q24H  . DULoxetine  60 mg Oral BID  . enoxaparin (LOVENOX) injection  40 mg Subcutaneous Q24H  . feeding supplement  30 mL Oral Q2000  . guaiFENesin  1,200 mg Oral BID  . ibuprofen  400 mg Oral QID  . lisinopril  40 mg Oral Daily  . methimazole  10 mg Oral Daily  . Tamsulosin HCl  0.4 mg Oral Daily   Continuous Infusions:   . 0.9 % NaCl with KCl 20 mEq / L 100 mL/hr at 07/28/12 0512   PRN  Meds:.acetaminophen, ALPRAZolam, HYDROmorphone (DILAUDID) injection, nicotine, ondansetron (ZOFRAN) IV, oxyCODONE, sodium phosphate, sorbitol, SUMAtriptan   Blood pressure 173/97, pulse 70, temperature 97.4 F (36.3 C), temperature source Oral, resp. rate 18, height 5\' 8"  (1.727 m), weight 65.3 kg (143 lb 15.4 oz), SpO2 93.00%.   Results for orders placed during the hospital encounter of 07/26/12 (from the past 48 hour(s))  BASIC METABOLIC PANEL     Status: Abnormal   Collection Time   07/27/12  4:47 AM      Component Value Range Comment   Sodium 130 (*) 135 - 145 mEq/L    Potassium 3.8  3.5 - 5.1 mEq/L    Chloride 97  96 - 112 mEq/L DELTA CHECK NOTED   CO2 23  19 - 32 mEq/L    Glucose, Bld 113 (*) 70 - 99 mg/dL    BUN 27 (*) 6 - 23 mg/dL    Creatinine, Ser 0.27  0.50 - 1.35 mg/dL    Calcium 8.4  8.4 - 25.3 mg/dL    GFR calc non Af Amer >90  >90 mL/min    GFR calc Af Amer >90  >90 mL/min   CBC     Status: Abnormal   Collection Time   07/27/12  4:47 AM      Component Value Range Comment   WBC 19.2 (*) 4.0 - 10.5 K/uL    RBC 4.16 (*) 4.22 - 5.81 MIL/uL    Hemoglobin 13.7  13.0 - 17.0 g/dL    HCT 66.4 (*) 40.3 - 52.0 %    MCV 91.3  78.0 - 100.0 fL    MCH 32.9  26.0 - 34.0 pg    MCHC 36.1 (*) 30.0 - 36.0 g/dL    RDW 47.4  25.9 - 56.3 %    Platelets 127 (*) 150 - 400  K/uL   LEGIONELLA ANTIGEN, URINE     Status: Normal   Collection Time   07/27/12  7:13 AM      Component Value Range Comment   Specimen Description URINE, CLEAN CATCH      Special Requests NONE      Legionella Antigen, Urine Negative for Legionella pneumophilia serogroup 1      Report Status 07/28/2012 FINAL     STREP PNEUMONIAE URINARY ANTIGEN     Status: Normal   Collection Time   07/27/12  7:13 AM      Component Value Range Comment   Strep Pneumo Urinary Antigen NEGATIVE  NEGATIVE   CBC     Status: Abnormal   Collection Time   07/28/12  5:41 AM      Component Value Range Comment   WBC 14.4 (*) 4.0 - 10.5 K/uL    RBC 4.12 (*) 4.22 - 5.81 MIL/uL    Hemoglobin 13.4  13.0 - 17.0 g/dL    HCT 87.5 (*) 64.3 - 52.0 %    MCV 91.0  78.0 - 100.0 fL    MCH 32.5  26.0 - 34.0 pg    MCHC 35.7  30.0 - 36.0 g/dL    RDW 32.9  51.8 - 84.1 %    Platelets 129 (*) 150 - 400 K/uL   BASIC METABOLIC PANEL     Status: Abnormal   Collection Time   07/28/12  5:41 AM      Component Value Range Comment   Sodium 129 (*) 135 - 145 mEq/L    Potassium 3.8  3.5 - 5.1 mEq/L    Chloride 98  96 -  112 mEq/L    CO2 25  19 - 32 mEq/L    Glucose, Bld 108 (*) 70 - 99 mg/dL    BUN 22  6 - 23 mg/dL    Creatinine, Ser 1.61  0.50 - 1.35 mg/dL    Calcium 8.2 (*) 8.4 - 10.5 mg/dL    GFR calc non Af Amer >90  >90 mL/min    GFR calc Af Amer >90  >90 mL/min   SEDIMENTATION RATE     Status: Abnormal   Collection Time   07/28/12  5:41 AM      Component Value Range Comment   Sed Rate 56 (*) 0 - 16 mm/hr     Mr Brain Wo Contrast  07/27/2012  *RADIOLOGY REPORT*  Clinical Data: Headache with memory loss. Hypertension.  Hepatitis C.  Prior stroke.  MRI HEAD WITHOUT CONTRAST  Technique:  Multiplanar, multiecho pulse sequences of the brain and surrounding structures were obtained according to standard protocol without intravenous contrast.  Comparison: 02/04/2012 CT.  07/19/2010 MR.  Findings: Motion degraded exam.  No acute  infarct.  Remote right thalamic infarct.  Remote right paracentral pontine infarct.  Tiny remote basal ganglia lacunar infarct.  No intracranial mass lesion detected on this unenhanced exam.  Mild atrophy without hydrocephalus.  No intracranial hemorrhage.  Major intracranial vascular structures are patent.  Minimal paranasal sinus mucosal thickening.  Partially empty sella.  Slightly heterogeneous bone marrow may be normal for this patient versus changes related to underlying anemia.  IMPRESSION:  Motion degraded exam.  No acute infarct.  Remote right thalamic infarct.  Remote right paracentral pontine infarct.  Tiny remote basal ganglia lacunar infarct.  Mild atrophy without hydrocephalus.  Minimal paranasal sinus mucosal thickening.  Partially empty sella.  Slightly heterogeneous bone marrow may be normal for this patient versus changes related to underlying anemia.   Original Report Authenticated By: Lacy Duverney, M.D.    Ct Abdomen Pelvis W Contrast  07/27/2012  *RADIOLOGY REPORT*  Clinical Data: Back and flank pain.  Nausea.  Chronic hepatitis C.  CT ABDOMEN AND PELVIS WITH CONTRAST  Technique:  Multidetector CT imaging of the abdomen and pelvis was performed following the standard protocol during bolus administration of intravenous contrast.  Contrast:  OMNIPAQUE IOHEXOL 300 MG/ML  SOLN  Comparison: Noncontrast CT on 07/26/2012  Findings: Diffuse enlargement right kidney is seen with heterogeneous pattern of contrast enhancement seen throughout the renal parenchyma, rather than a single vascular distribution. There is soft tissue stranding in the right perinephric space with minimal fluid extending inferiorly along the retroperitoneal fat planes.  The right renal artery and vein are patent.  There is no evidence of renal mass or hydronephrosis.  These findings are consistent with pyelonephritis.  The left kidney is normal in appearance.  A small amount of free fluid is seen in the pelvic cul-de-sac.   No other inflammatory process or abscess identified.  No evidence of bowel wall thickening or dilatation.  No soft tissue masses or lymphadenopathy identified within the abdomen or pelvis.  The liver, gallbladder, spleen, pancreas, and adrenal glands are normal appearance.  Mild bibasilar atelectasis again noted.  IMPRESSION:  1.  Right pyelonephritis.  No evidence of renal abscess or hydronephrosis. 2.  Mild bibasilar atelectasis.   Original Report Authenticated By: Myles Rosenthal, M.D.         Makynleigh Breslin A. Gerilyn Pilgrim, M.D.  Diplomate, Biomedical engineer of Psychiatry and Neurology ( Neurology). 07/28/2012, 4:44 PM

## 2012-07-29 LAB — RESPIRATORY VIRUS PANEL
Adenovirus: NOT DETECTED
Influenza A H1: NOT DETECTED
Influenza A H3: NOT DETECTED
Influenza A: NOT DETECTED
Influenza B: NOT DETECTED
Metapneumovirus: NOT DETECTED
Parainfluenza 1: NOT DETECTED
Parainfluenza 2: NOT DETECTED
Parainfluenza 3: NOT DETECTED
Respiratory Syncytial Virus A: NOT DETECTED
Respiratory Syncytial Virus B: NOT DETECTED
Rhinovirus: NOT DETECTED

## 2012-07-29 LAB — CBC
HCT: 37.5 % — ABNORMAL LOW (ref 39.0–52.0)
Hemoglobin: 13.4 g/dL (ref 13.0–17.0)
MCH: 32.4 pg (ref 26.0–34.0)
MCHC: 35.7 g/dL (ref 30.0–36.0)
MCV: 90.8 fL (ref 78.0–100.0)
Platelets: 137 10*3/uL — ABNORMAL LOW (ref 150–400)
RBC: 4.13 MIL/uL — ABNORMAL LOW (ref 4.22–5.81)
RDW: 12.8 % (ref 11.5–15.5)
WBC: 15 10*3/uL — ABNORMAL HIGH (ref 4.0–10.5)

## 2012-07-29 LAB — BASIC METABOLIC PANEL
BUN: 17 mg/dL (ref 6–23)
CO2: 25 mEq/L (ref 19–32)
Calcium: 8 mg/dL — ABNORMAL LOW (ref 8.4–10.5)
Chloride: 103 mEq/L (ref 96–112)
Creatinine, Ser: 0.7 mg/dL (ref 0.50–1.35)
GFR calc Af Amer: >90 mL/min (ref >90)
GFR calc non Af Amer: >90 mL/min (ref >90)
Glucose, Bld: 98 mg/dL (ref 70–99)
Potassium: 4.4 mEq/L (ref 3.5–5.1)
Sodium: 135 mEq/L (ref 135–145)

## 2012-07-29 MED ORDER — HYDRALAZINE HCL 20 MG/ML IJ SOLN: 5.0000 mg | Freq: Four times a day (QID) | INTRAMUSCULAR | Status: DC | PRN

## 2012-07-29 MED ORDER — KETOROLAC TROMETHAMINE 30 MG/ML IJ SOLN
30.0000 mg | Freq: Four times a day (QID) | INTRAMUSCULAR | Status: DC | PRN
  Administered 2012-07-29: 30 mg via INTRAVENOUS
  Filled 2012-07-29: qty 1

## 2012-07-29 MED ORDER — KETOROLAC TROMETHAMINE 30 MG/ML IJ SOLN: 30.0000 mg | Freq: Four times a day (QID) | INTRAMUSCULAR | Status: DC

## 2012-07-29 NOTE — Progress Notes (Signed)
[facility] PROGRESS NOTE  Eric Tran ZOX:[id number] DOB: April 11, 1951 DOA: 07/26/2012 PCP: MUSE,ROCHELLE D., PA  Assessment/Plan: 1. E coli Pylonephritis: Ct abdomen yields right pyelonephritis without evidence of renal abcess or hydronephrosis. Afebrile. VSS non-toxic appearing. On rocephin day #3.Marland Kitchen Blood cultures no growth to date.  2. Dehydration/hyponatremia likely related to decreased po intake. Resolved this am. Will continue to hold HCTZ for now. Will continue IV fluids at 50/hr. Will monitor closely. bmet in am.  3. Intractable headache: etiology uncertain. Appreciate assistance of neurology. MRI brain was unremarkable. Migraine therapy with reglan, ibuprofen and sumatriptan somewhat effective. ESR 56. Neurology opines most likely has a parameningeal process causing irritation of the meninges due to #1. Also recommends torodol and Imitrex and avoid opiates if possible. Also recommends spinal tap for further eval  4. HTN: poor control likely related to pain. SBP range 167-192. Will continue ACE and continue to hold HCTZ being held due to #2. Will decrease rate of IV fluids.  Will add hydralazine prn.  Monitor 5. Leukocytosis: related to #1 and pt reports having taken steroids pre admission. Up slightly today. Afebrile non-toxic appearing.  6. Hyperthyroidism: TSH and free T4 WNL. Will continue home regime 7. Chronic back pain: describes flank pain. CT abdomen/pelvis right pylonephritis. Continue home pain meds but change to every 3hr prn. Monitor 8. Tobacco use: counseled regarding cessation. Continue nicotine patch.   Code Status: full Family Communication:  Disposition Plan: home with wife when ready   Consultants:  neuro  Procedures:  LP 07/29/12  Antibiotics:  Rocephin 07/27/12>>>>  HPI/Subjective: Sitting up in bed drinking coffee. Reports only brief relief of headache with dilaudid. States dilaudid is only thing that makes pain better  Objective: Filed Vitals:    07/28/12 2041 07/29/12 0217 07/29/12 0423 07/29/12 1038  BP: 192/118 170/91 190/99 167/88  Pulse: 74 77 82 70  Temp: 97.9 F (36.6 C) 98.3 F (36.8 C) 97.7 F (36.5 C) 98.1 F (36.7 C)  TempSrc: Oral Oral Oral Oral  Resp: 18 18 18 18   Height:      Weight:   66.6 kg (146 lb 13.2 oz)   SpO2: 96% 95% 98% 95%    Intake/Output Summary (Last 24 hours) at 07/29/12 1416 Last data filed at 07/29/12 1254  Gross per 24 hour  Intake   8307 ml  Output   2200 ml  Net   6107 ml   Filed Weights   07/26/12 0606 07/27/12 0736 07/29/12 0423  Weight: 61.5 kg (135 lb 9.3 oz) 65.3 kg (143 lb 15.4 oz) 66.6 kg (146 lb 13.2 oz)    Exam:   General:  Awake alert No distress  Cardiovascular: RRR No [name] No MGR  Respiratory: normal effort, BSCTAB No wheeze  Abdomen: soft +BS throughout, non-tender to palpation  Data Reviewed: Basic Metabolic Panel:  Lab 07/29/12 8119 07/28/12 0541 07/27/12 0447 07/26/12 0400  NA 135 129* 130* 126*  K 4.4 3.8 3.8 3.7  CL 103 98 97 89*  CO2 25 25 23 24   GLUCOSE 98 108* 113* 126*  BUN 17 22 27* 22  CREATININE 0.70 0.71 0.81 0.78  CALCIUM 8.0* 8.2* 8.4 8.9  MG -- -- -- --  PHOS -- -- -- --   Liver Function Tests: No results found for this basename: AST:5,ALT:5,ALKPHOS:5,BILITOT:5,PROT:5,ALBUMIN:5 in the last 168 hours No results found for this basename: LIPASE:5,AMYLASE:5 in the last 168 hours No results found for this basename: AMMONIA:5 in the last 168 hours CBC:  Lab 07/29/12  1610 07/28/12 0541 07/27/12 0447 07/26/12 0603 07/26/12 0400  WBC 15.0* 14.4* 19.2* 20.3* 20.2*  NEUTROABS -- -- -- -- 17.4*  HGB 13.4 13.4 13.7 14.2 15.0  HCT 37.5* 37.5* 38.0* 39.2 40.9  MCV 90.8 91.0 91.3 90.7 91.1  PLT 137* 129* 127* 140* 145*   Cardiac Enzymes: No results found for this basename: CKTOTAL:5,CKMB:5,CKMBINDEX:5,TROPONINI:5 in the last 168 hours BNP (last 3 results) No results found for this basename: PROBNP:3 in the last 8760 hours CBG: No results  found for this basename: GLUCAP:5 in the last 168 hours  Recent Results (from the past 240 hour(s))  URINE CULTURE     Status: Normal   Collection Time   07/26/12  3:55 AM      Component Value Range Status Comment   Specimen Description URINE, CLEAN CATCH   Final    Special Requests NONE   Final    Culture  Setup Time 07/26/2012 15:42   Final    Colony Count >=100,000 COLONIES/ML   Final    Culture ESCHERICHIA COLI   Final    Report Status 07/28/2012 FINAL   Final    Organism ID, Bacteria ESCHERICHIA COLI   Final   CULTURE, BLOOD (ROUTINE X 2)     Status: Normal (Preliminary result)   Collection Time   07/26/12  6:04 AM      Component Value Range Status Comment   Specimen Description BLOOD LEFT ARM   Final    Special Requests BOTTLES DRAWN AEROBIC AND ANAEROBIC 12CC   Final    Culture NO GROWTH 3 DAYS   Final    Report Status PENDING   Incomplete   CULTURE, BLOOD (ROUTINE X 2)     Status: Normal (Preliminary result)   Collection Time   07/26/12  6:12 AM      Component Value Range Status Comment   Specimen Description BLOOD LEFT HAND   Final    Special Requests BOTTLES DRAWN AEROBIC AND ANAEROBIC 12CC   Final    Culture NO GROWTH 3 DAYS   Final    Report Status PENDING   Incomplete   URINE CULTURE     Status: Normal   Collection Time   07/27/12 12:09 PM      Component Value Range Status Comment   Specimen Description URINE, CLEAN CATCH   Final    Special Requests NONE   Final    Culture  Setup Time 07/27/2012 22:20   Final    Colony Count NO GROWTH   Final    Culture NO GROWTH   Final    Report Status 07/28/2012 FINAL   Final      Studies: No results found.  Scheduled Meds:   . aspirin  81 mg Oral Daily  . cefTRIAXone (ROCEPHIN)  IV  1 g Intravenous Q24H  . DULoxetine  60 mg Oral BID  . feeding supplement  30 mL Oral Q2000  . guaiFENesin  1,200 mg Oral BID  . ibuprofen  400 mg Oral QID  . lisinopril  40 mg Oral Daily  . methimazole  10 mg Oral Daily  . Tamsulosin HCl   0.4 mg Oral Daily   Continuous Infusions:   . 0.9 % NaCl with KCl 20 mEq / L 50 mL/hr at 07/29/12 0911    Principal Problem:  *Viral syndrome Active Problems:  Chronic hepatitis C  CAP (community acquired pneumonia)  Dehydration  Intractable headache  Chronic pain  Anxiety disorder  Flank pain, acute  Hyperthyroidism  Tobacco abuse  Hyponatremia  Pyelonephritis    Time spent: 30 minutes    [facility]  [facility]  If 8PM-8AM, please contact night-coverage at [email or web address], password [email or web address] 07/29/2012, 2:16 PM  LOS: 3 days     Attending note:  Patient seen and examined.  Above note reviewed.  This patient was admitted with flank pain and headache.  He was found to have an E coli pyelonephritis and was appropriately started on antibiotics.  Would continue a total of 7-10 days of antibiotics.  His headache appears to be his biggest concern.  On my arrival today, both patient and his wife were sleeping in the bed. Patient was able to carry on conversation, without any noticeable distress. He was started on multiple medications for headache including imitrex, reglan, toradol.  He is also receiving oxycodone.  He reports that none of these medications provided any sort of relief.  The only medication that provides relief is IV dilaudid. Patient appears to be very fixated on his pain regimen.  He admits to being a prior IV drug abuser. Patient has been seen by neurology.  His MRI was unremarkable. Recommendations are for lumbar puncture for spinal fluid analysis which will be done in the morning.    Patient states that regardless of test outcomes, that he will want to discharge home tomorrow evening. I think if the initial spinal fluid analysis is negative, then it would be appropriate to discharge him tomorrow. Would avoid discharging the patient on oral narcotics.  Patient did have a spike in blood pressures after imitrex started.  This has been discontinued.

## 2012-07-29 NOTE — Discharge Summary (Addendum)
Physician Discharge Summary  Eric Tran ZOX:[id number] DOB: Apr 05, 1951 DOA: 07/26/2012  PCP: Kizzie Furnish D., PA  Admit date: 07/26/2012 Discharge date: 07/29/2012  Time spent: 40 minutes  PATIENT LEFT THE HOSPITAL AGAINST MEDICAL ADVICE  Recommendations for Outpatient Follow-up:  1. Outpatient followup was not arranged since patient left against medical advice  Discharge Diagnoses:  Principal Problem:  Pyelonephritis Active Problems:  Chronic hepatitis C   Dehydration  Intractable headache  Chronic pain  Anxiety disorder  Flank pain, acute  Hyperthyroidism  Tobacco abuse  Hyponatremia     Filed Weights   07/26/12 0606 07/27/12 0736 07/29/12 0423  Weight: 61.5 kg (135 lb 9.3 oz) 65.3 kg (143 lb 15.4 oz) 66.6 kg (146 lb 13.2 oz)    History of present illness:  Eric Tran is an 62 y.o. male. Caucasian gentleman with multiple complicated medical problems, gets his healthcare through the [facility]; he has early hepatitis C which is pending definitive treatment now that his hyperthyroidism is well controlled. He has chronic pains, he has anxiety and panic attacks for which she takes when necessary Xanax.  For the past 2 weeks patient has been ill prominently back pains, fever and chills and shortness of breath nasal congestion photophobia, and a nonproductive cough. He has been treating himself prominently with his wife is aspirin-caffeine-barbiturates combination, does not seek medical attention. In the past few days the coughing and body aches have gotten much worse and he came to the emergency room for assistance.  Prominent symptoms seem to be headache and back pains, he notes that the tamsulosin which she takes for his BPH has "not been working" for the past week, and he has developed frequency, dysuria and urge incontinence.  He has not taken her flu shot.  His white count is elevated in the emergency room, but in fact he is self medicated with 3 doses of  prednisone for a skin problem, " because I knows prednisone helped skin problems"  Wrist x-ray in the emergency room shows atelectasis versus infiltrate   Hospital Course:  This patient was admitted to the hospital with flank pain and headache. He was found to have a pyelonephritis due to Escherichia coli. He was apparently put on Rocephin. His flank pain subsequently resolved. His main complaint was headache. She did not report any prior history of headache in the past. The patient required frequent pain medicine. He reported a history of memory loss, blackouts, seizures or past few months. For this reason an MRI of the brain was done which did not show any acute findings. Due to the persistence of the patient's symptoms a neurology consult was obtained. Recommendations were to pursue a possible lumbar puncture. The patient was very fixated on his pain medications throughout his hospital course. He repeatedly stated that any oral medication would not be helpful for his headache. He only wanted to take IV Dilaudid for his pain medications. When he was given IV Dilaudid, she requested to escalate dosing. Clinically, patient was able to carry on conversation without any distress. He was often found sleeping in bed. His symptoms were out of proportion to objective findings. Patient repeatedly requested different types of IV narcotics. He also did report a previous history of IV drug abuse. When patient's narcotics are not escalated, he became very agitated and angry and left the hospital AGAINST MEDICAL ADVICE.   Consultations:  Neurology, Dr. Gerilyn Pilgrim  Discharge Exam: Filed Vitals:   07/29/12 0217 07/29/12 0423 07/29/12 1038 07/29/12 1452  BP: 170/91 190/99 167/88 174/87  Pulse: 77 82 70 74  Temp: 98.3 F (36.8 C) 97.7 F (36.5 C) 98.1 F (36.7 C) 97.4 F (36.3 C)  TempSrc: Oral Oral Oral Oral  Resp: 18 18 18 18   Height:      Weight:  66.6 kg (146 lb 13.2 oz)    SpO2: 95% 98% 95% 95%     Physical exam: see note from earlier today  Discharge Instructions     The results of significant diagnostics from this hospitalization (including imaging, microbiology, ancillary and laboratory) are listed below for reference.    Significant Diagnostic Studies: Ct Abdomen Pelvis Wo Contrast  07/26/2012  *RADIOLOGY REPORT*  Clinical Data:  Abnormal chest x-ray with right side flank pain and coughing.  Bodyaches.  Hepatitis C.  CT CHEST, ABDOMEN AND PELVIS WITHOUT CONTRAST  Technique:  Multidetector CT imaging of the chest, abdomen and pelvis was performed following the standard protocol without IV contrast.  Comparison:  Chest x-ray from earlier today.  CT CHEST  Findings: The no axillary lymphadenopathy.  No mediastinal or hilar lymphadenopathy.  Heart size is normal.  No pericardial or pleural effusion.  Lung windows demonstrate emphysema bilaterally.  There is some atelectasis in the right base.  No evidence for pneumonia.  No parenchymal nodule or mass.  Bone windows show nonacute posterior left lower rib fractures.  IMPRESSION: Right base atelectasis.  Otherwise no acute findings.  CT ABDOMEN AND PELVIS  Findings:  No focal abnormalities seen in the liver or spleen on this study performed without intravenous contrast material. Liver is enlarged at 18 cm in cranial caudal length.  Stomach is decompressed.  Duodenum, pancreas, and gallbladder are unremarkable.  No adrenal mass.  The right kidney appears enlarged and demonstrates perinephric edema the.  No hydronephrosis in either kidney.  No renal stones.  No abdominal aortic aneurysm.  No definite abdominal lymphadenopathy although the lack of oral and IV contrast limits sensitivity for detection.  Imaging through the pelvis shows a small amount of intraperitoneal free fluid.  There is no pelvic sidewall lymphadenopathy.  Bladder is not distended.  No colonic diverticulitis. The appendix is not visualized, but there is no edema or inflammation  in the region of the cecum.  Terminal ileum is normal.  Bone windows reveal no worrisome lytic or sclerotic osseous lesions.  IMPRESSION: Right kidney appears enlarged and edematous with perinephric edema/inflammation.  There is some fluid tracks down the retroperitoneal space and right pericolic gutter and in the pelvis. Etiology for these findings is not clear, but renal infection, urinary neoplasm, or recent right-sided urinary stone passage would all be considerations. If renal function allows, repeat imaging after IV contrast administration may prove helpful.  Hepatomegaly.   Original Report Authenticated By: Kennith Center, M.D.    Ct Chest Wo Contrast  07/26/2012  *RADIOLOGY REPORT*  Clinical Data:  Abnormal chest x-ray with right side flank pain and coughing.  Bodyaches.  Hepatitis C.  CT CHEST, ABDOMEN AND PELVIS WITHOUT CONTRAST  Technique:  Multidetector CT imaging of the chest, abdomen and pelvis was performed following the standard protocol without IV contrast.  Comparison:  Chest x-ray from earlier today.  CT CHEST  Findings: The no axillary lymphadenopathy.  No mediastinal or hilar lymphadenopathy.  Heart size is normal.  No pericardial or pleural effusion.  Lung windows demonstrate emphysema bilaterally.  There is some atelectasis in the right base.  No evidence for pneumonia.  No parenchymal nodule or mass.  Bone windows  show nonacute posterior left lower rib fractures.  IMPRESSION: Right base atelectasis.  Otherwise no acute findings.  CT ABDOMEN AND PELVIS  Findings:  No focal abnormalities seen in the liver or spleen on this study performed without intravenous contrast material. Liver is enlarged at 18 cm in cranial caudal length.  Stomach is decompressed.  Duodenum, pancreas, and gallbladder are unremarkable.  No adrenal mass.  The right kidney appears enlarged and demonstrates perinephric edema the.  No hydronephrosis in either kidney.  No renal stones.  No abdominal aortic aneurysm.  No  definite abdominal lymphadenopathy although the lack of oral and IV contrast limits sensitivity for detection.  Imaging through the pelvis shows a small amount of intraperitoneal free fluid.  There is no pelvic sidewall lymphadenopathy.  Bladder is not distended.  No colonic diverticulitis. The appendix is not visualized, but there is no edema or inflammation in the region of the cecum.  Terminal ileum is normal.  Bone windows reveal no worrisome lytic or sclerotic osseous lesions.  IMPRESSION: Right kidney appears enlarged and edematous with perinephric edema/inflammation.  There is some fluid tracks down the retroperitoneal space and right pericolic gutter and in the pelvis. Etiology for these findings is not clear, but renal infection, urinary neoplasm, or recent right-sided urinary stone passage would all be considerations. If renal function allows, repeat imaging after IV contrast administration may prove helpful.  Hepatomegaly.   Original Report Authenticated By: Kennith Center, M.D.    Mr Brain Wo Contrast  07/27/2012  *RADIOLOGY REPORT*  Clinical Data: Headache with memory loss. Hypertension.  Hepatitis C.  Prior stroke.  MRI HEAD WITHOUT CONTRAST  Technique:  Multiplanar, multiecho pulse sequences of the brain and surrounding structures were obtained according to standard protocol without intravenous contrast.  Comparison: 02/04/2012 CT.  07/19/2010 MR.  Findings: Motion degraded exam.  No acute infarct.  Remote right thalamic infarct.  Remote right paracentral pontine infarct.  Tiny remote basal ganglia lacunar infarct.  No intracranial mass lesion detected on this unenhanced exam.  Mild atrophy without hydrocephalus.  No intracranial hemorrhage.  Major intracranial vascular structures are patent.  Minimal paranasal sinus mucosal thickening.  Partially empty sella.  Slightly heterogeneous bone marrow may be normal for this patient versus changes related to underlying anemia.  IMPRESSION:  Motion degraded  exam.  No acute infarct.  Remote right thalamic infarct.  Remote right paracentral pontine infarct.  Tiny remote basal ganglia lacunar infarct.  Mild atrophy without hydrocephalus.  Minimal paranasal sinus mucosal thickening.  Partially empty sella.  Slightly heterogeneous bone marrow may be normal for this patient versus changes related to underlying anemia.   Original Report Authenticated By: Lacy Duverney, M.D.    Ct Abdomen Pelvis W Contrast  07/27/2012  *RADIOLOGY REPORT*  Clinical Data: Back and flank pain.  Nausea.  Chronic hepatitis C.  CT ABDOMEN AND PELVIS WITH CONTRAST  Technique:  Multidetector CT imaging of the abdomen and pelvis was performed following the standard protocol during bolus administration of intravenous contrast.  Contrast:  OMNIPAQUE IOHEXOL 300 MG/ML  SOLN  Comparison: Noncontrast CT on 07/26/2012  Findings: Diffuse enlargement right kidney is seen with heterogeneous pattern of contrast enhancement seen throughout the renal parenchyma, rather than a single vascular distribution. There is soft tissue stranding in the right perinephric space with minimal fluid extending inferiorly along the retroperitoneal fat planes.  The right renal artery and vein are patent.  There is no evidence of renal mass or hydronephrosis.  These findings are consistent  with pyelonephritis.  The left kidney is normal in appearance.  A small amount of free fluid is seen in the pelvic cul-de-sac.  No other inflammatory process or abscess identified.  No evidence of bowel wall thickening or dilatation.  No soft tissue masses or lymphadenopathy identified within the abdomen or pelvis.  The liver, gallbladder, spleen, pancreas, and adrenal glands are normal appearance.  Mild bibasilar atelectasis again noted.  IMPRESSION:  1.  Right pyelonephritis.  No evidence of renal abscess or hydronephrosis. 2.  Mild bibasilar atelectasis.   Original Report Authenticated By: Myles Rosenthal, M.D.    Dg Chest Portable 1  View  07/26/2012  *RADIOLOGY REPORT*  Clinical Data: Cough  PORTABLE CHEST - 1 VIEW  Comparison: 02/04/2012  Findings: Mild left greater than right infrahilar opacity. Cardiomediastinal contours otherwise within normal range. Mild linear right mid lung opacity, favor atelectasis.  No pleural effusion or pneumothorax.  No acute osseous finding.  IMPRESSION: Mild left greater than right infrahilar opacities may reflect early infiltrate.  Recommend follow-up with PA and lateral views to better characterize.   Original Report Authenticated By: Jearld Lesch, M.D.     Microbiology: Recent Results (from the past 240 hour(s))  URINE CULTURE     Status: Normal   Collection Time   07/26/12  3:55 AM      Component Value Range Status Comment   Specimen Description URINE, CLEAN CATCH   Final    Special Requests NONE   Final    Culture  Setup Time 07/26/2012 15:42   Final    Colony Count >=100,000 COLONIES/ML   Final    Culture ESCHERICHIA COLI   Final    Report Status 07/28/2012 FINAL   Final    Organism ID, Bacteria ESCHERICHIA COLI   Final   CULTURE, BLOOD (ROUTINE X 2)     Status: Normal (Preliminary result)   Collection Time   07/26/12  6:04 AM      Component Value Range Status Comment   Specimen Description BLOOD LEFT ARM   Final    Special Requests BOTTLES DRAWN AEROBIC AND ANAEROBIC 12CC   Final    Culture NO GROWTH 3 DAYS   Final    Report Status PENDING   Incomplete   CULTURE, BLOOD (ROUTINE X 2)     Status: Normal (Preliminary result)   Collection Time   07/26/12  6:12 AM      Component Value Range Status Comment   Specimen Description BLOOD LEFT HAND   Final    Special Requests BOTTLES DRAWN AEROBIC AND ANAEROBIC 12CC   Final    Culture NO GROWTH 3 DAYS   Final    Report Status PENDING   Incomplete   URINE CULTURE     Status: Normal   Collection Time   07/27/12 12:09 PM      Component Value Range Status Comment   Specimen Description URINE, CLEAN CATCH   Final    Special Requests  NONE   Final    Culture  Setup Time 07/27/2012 22:20   Final    Colony Count NO GROWTH   Final    Culture NO GROWTH   Final    Report Status 07/28/2012 FINAL   Final      Labs: Basic Metabolic Panel:  Lab 07/29/12 1610 07/28/12 0541 07/27/12 0447 07/26/12 0400  NA 135 129* 130* 126*  K 4.4 3.8 3.8 3.7  CL 103 98 97 89*  CO2 25 25 23 24   GLUCOSE  98 108* 113* 126*  BUN 17 22 27* 22  CREATININE 0.70 0.71 0.81 0.78  CALCIUM 8.0* 8.2* 8.4 8.9  MG -- -- -- --  PHOS -- -- -- --   Liver Function Tests: No results found for this basename: AST:5,ALT:5,ALKPHOS:5,BILITOT:5,PROT:5,ALBUMIN:5 in the last 168 hours No results found for this basename: LIPASE:5,AMYLASE:5 in the last 168 hours No results found for this basename: AMMONIA:5 in the last 168 hours CBC:  Lab 07/29/12 0525 07/28/12 0541 07/27/12 0447 07/26/12 0603 07/26/12 0400  WBC 15.0* 14.4* 19.2* 20.3* 20.2*  NEUTROABS -- -- -- -- 17.4*  HGB 13.4 13.4 13.7 14.2 15.0  HCT 37.5* 37.5* 38.0* 39.2 40.9  MCV 90.8 91.0 91.3 90.7 91.1  PLT 137* 129* 127* 140* 145*   Cardiac Enzymes: No results found for this basename: CKTOTAL:5,CKMB:5,CKMBINDEX:5,TROPONINI:5 in the last 168 hours BNP: BNP (last 3 results) No results found for this basename: PROBNP:3 in the last 8760 hours CBG: No results found for this basename: GLUCAP:5 in the last 168 hours     Signed:  Refoel Palladino  [facility] 07/29/2012, 6:26 PM

## 2012-07-29 NOTE — Progress Notes (Signed)
Patient left AMA Dr. Kerry Hough notified

## 2012-07-31 LAB — CULTURE, BLOOD (ROUTINE X 2)
Culture: NO GROWTH
Culture: NO GROWTH

## 2013-01-24 DIAGNOSIS — R4182 Altered mental status, unspecified: Secondary | ICD-10-CM

## 2013-02-22 DIAGNOSIS — R079 Chest pain, unspecified: Secondary | ICD-10-CM

## 2013-02-23 DIAGNOSIS — R079 Chest pain, unspecified: Secondary | ICD-10-CM

## 2013-03-02 DIAGNOSIS — M25519 Pain in unspecified shoulder: Secondary | ICD-10-CM

## 2013-03-31 ENCOUNTER — Emergency Department (HOSPITAL_COMMUNITY): Payer: Non-veteran care

## 2013-03-31 ENCOUNTER — Encounter (HOSPITAL_COMMUNITY): Payer: Self-pay | Admitting: *Deleted

## 2013-03-31 ENCOUNTER — Emergency Department (HOSPITAL_COMMUNITY)
Admission: EM | Admit: 2013-03-31 | Discharge: 2013-03-31 | Discharge status: Against Medical Advice | Payer: Non-veteran care | Attending: Emergency Medicine | Admitting: Emergency Medicine

## 2013-03-31 DIAGNOSIS — J3489 Other specified disorders of nose and nasal sinuses: Secondary | ICD-10-CM | POA: Insufficient documentation

## 2013-03-31 DIAGNOSIS — R0789 Other chest pain: Secondary | ICD-10-CM | POA: Insufficient documentation

## 2013-03-31 DIAGNOSIS — G40909 Epilepsy, unspecified, not intractable, without status epilepticus: Secondary | ICD-10-CM | POA: Insufficient documentation

## 2013-03-31 DIAGNOSIS — R05 Cough: Secondary | ICD-10-CM | POA: Insufficient documentation

## 2013-03-31 DIAGNOSIS — IMO0001 Reserved for inherently not codable concepts without codable children: Secondary | ICD-10-CM | POA: Insufficient documentation

## 2013-03-31 DIAGNOSIS — G8929 Other chronic pain: Secondary | ICD-10-CM | POA: Insufficient documentation

## 2013-03-31 DIAGNOSIS — R42 Dizziness and giddiness: Secondary | ICD-10-CM | POA: Insufficient documentation

## 2013-03-31 DIAGNOSIS — H539 Unspecified visual disturbance: Secondary | ICD-10-CM | POA: Insufficient documentation

## 2013-03-31 DIAGNOSIS — Z79899 Other long term (current) drug therapy: Secondary | ICD-10-CM | POA: Insufficient documentation

## 2013-03-31 DIAGNOSIS — I10 Essential (primary) hypertension: Secondary | ICD-10-CM

## 2013-03-31 DIAGNOSIS — Z87440 Personal history of urinary (tract) infections: Secondary | ICD-10-CM | POA: Insufficient documentation

## 2013-03-31 DIAGNOSIS — R519 Headache, unspecified: Secondary | ICD-10-CM

## 2013-03-31 DIAGNOSIS — Z8619 Personal history of other infectious and parasitic diseases: Secondary | ICD-10-CM | POA: Insufficient documentation

## 2013-03-31 DIAGNOSIS — M129 Arthropathy, unspecified: Secondary | ICD-10-CM | POA: Insufficient documentation

## 2013-03-31 DIAGNOSIS — M549 Dorsalgia, unspecified: Secondary | ICD-10-CM | POA: Insufficient documentation

## 2013-03-31 DIAGNOSIS — Z7982 Long term (current) use of aspirin: Secondary | ICD-10-CM | POA: Insufficient documentation

## 2013-03-31 DIAGNOSIS — F329 Major depressive disorder, single episode, unspecified: Secondary | ICD-10-CM | POA: Insufficient documentation

## 2013-03-31 DIAGNOSIS — Z8673 Personal history of transient ischemic attack (TIA), and cerebral infarction without residual deficits: Secondary | ICD-10-CM | POA: Insufficient documentation

## 2013-03-31 DIAGNOSIS — R0602 Shortness of breath: Secondary | ICD-10-CM | POA: Insufficient documentation

## 2013-03-31 DIAGNOSIS — F411 Generalized anxiety disorder: Secondary | ICD-10-CM | POA: Insufficient documentation

## 2013-03-31 DIAGNOSIS — R569 Unspecified convulsions: Secondary | ICD-10-CM

## 2013-03-31 DIAGNOSIS — R109 Unspecified abdominal pain: Secondary | ICD-10-CM | POA: Insufficient documentation

## 2013-03-31 DIAGNOSIS — R5381 Other malaise: Secondary | ICD-10-CM | POA: Insufficient documentation

## 2013-03-31 DIAGNOSIS — R110 Nausea: Secondary | ICD-10-CM | POA: Insufficient documentation

## 2013-03-31 DIAGNOSIS — J029 Acute pharyngitis, unspecified: Secondary | ICD-10-CM | POA: Insufficient documentation

## 2013-03-31 DIAGNOSIS — R413 Other amnesia: Secondary | ICD-10-CM | POA: Insufficient documentation

## 2013-03-31 DIAGNOSIS — F3289 Other specified depressive episodes: Secondary | ICD-10-CM | POA: Insufficient documentation

## 2013-03-31 DIAGNOSIS — F17200 Nicotine dependence, unspecified, uncomplicated: Secondary | ICD-10-CM | POA: Insufficient documentation

## 2013-03-31 DIAGNOSIS — R059 Cough, unspecified: Secondary | ICD-10-CM | POA: Insufficient documentation

## 2013-03-31 DIAGNOSIS — R51 Headache: Secondary | ICD-10-CM | POA: Insufficient documentation

## 2013-03-31 HISTORY — DX: Unspecified convulsions: R56.9

## 2013-03-31 LAB — URINALYSIS, ROUTINE W REFLEX MICROSCOPIC
Bilirubin Urine: NEGATIVE
Glucose, UA: NEGATIVE mg/dL
Hgb urine dipstick: NEGATIVE
Ketones, ur: NEGATIVE mg/dL
Leukocytes, UA: NEGATIVE
Nitrite: NEGATIVE
Protein, ur: NEGATIVE mg/dL
Specific Gravity, Urine: 1.02 (ref 1.005–1.030)
Urobilinogen, UA: 0.2 mg/dL (ref 0.0–1.0)
pH: 6 (ref 5.0–8.0)

## 2013-03-31 LAB — COMPREHENSIVE METABOLIC PANEL
ALT: 31 U/L (ref 0–53)
AST: 30 U/L (ref 0–37)
Albumin: 3.9 g/dL (ref 3.5–5.2)
Alkaline Phosphatase: 73 U/L (ref 39–117)
BUN: 12 mg/dL (ref 6–23)
CO2: 26 mEq/L (ref 19–32)
Calcium: 9.4 mg/dL (ref 8.4–10.5)
Chloride: 102 mEq/L (ref 96–112)
Creatinine, Ser: 0.7 mg/dL (ref 0.50–1.35)
GFR calc Af Amer: >90 mL/min (ref >90)
GFR calc non Af Amer: >90 mL/min (ref >90)
Glucose, Bld: 88 mg/dL (ref 70–99)
Potassium: 3.9 mEq/L (ref 3.5–5.1)
Sodium: 139 mEq/L (ref 135–145)
Total Bilirubin: 0.4 mg/dL (ref 0.3–1.2)
Total Protein: 7.9 g/dL (ref 6.0–8.3)

## 2013-03-31 LAB — PRO B NATRIURETIC PEPTIDE: Pro B Natriuretic peptide (BNP): 192.4 pg/mL — ABNORMAL HIGH (ref 0–125)

## 2013-03-31 LAB — CBC WITH DIFFERENTIAL/PLATELET
Basophils Absolute: 0 10*3/uL (ref 0.0–0.1)
Basophils Relative: 0 % (ref 0–1)
Eosinophils Absolute: 0.1 10*3/uL (ref 0.0–0.7)
Eosinophils Relative: 1 % (ref 0–5)
HCT: 42.7 % (ref 39.0–52.0)
Hemoglobin: 15.3 g/dL (ref 13.0–17.0)
Lymphocytes Relative: 24 % (ref 12–46)
Lymphs Abs: 2 10*3/uL (ref 0.7–4.0)
MCH: 33.3 pg (ref 26.0–34.0)
MCHC: 35.8 g/dL (ref 30.0–36.0)
MCV: 93 fL (ref 78.0–100.0)
Monocytes Absolute: 0.8 10*3/uL (ref 0.1–1.0)
Monocytes Relative: 10 % (ref 3–12)
Neutro Abs: 5.6 10*3/uL (ref 1.7–7.7)
Neutrophils Relative %: 66 % (ref 43–77)
Platelets: 312 10*3/uL (ref 150–400)
RBC: 4.59 MIL/uL (ref 4.22–5.81)
RDW: 12.8 % (ref 11.5–15.5)
WBC: 8.5 10*3/uL (ref 4.0–10.5)

## 2013-03-31 LAB — LIPASE, BLOOD: Lipase: 59 U/L (ref 11–59)

## 2013-03-31 MED ORDER — SODIUM CHLORIDE 0.9 % IV SOLN
INTRAVENOUS | Status: DC
  Administered 2013-03-31: 15:00:00 via INTRAVENOUS

## 2013-03-31 NOTE — ED Provider Notes (Addendum)
CSN: [id number]     Arrival date & time 03/31/13  1221 History   This chart was scribed for Shelda Jakes, MD, by Yevette Edwards, ED Scribe. This patient was seen in room APA17/APA17 and the patient's care was started at 1:19 PM. First MD Initiated Contact with Patient 03/31/13 1314     Chief Complaint  Patient presents with  . Memory Loss    The history is provided by the patient and the spouse. No language interpreter was used.   HPI Comments: Eric Tran is a 62 y.o. male, with a h/o stroke and HTN and who smokes daily, presenting to the Emergency Department complaining of gradually increasing high blood pressure. The pt's wife reports that his blood pressure has increased in the past several weeks and has been accompanied by increasingly frequent episodes of short term memory loss. Due to the pt's h/o stroke, she is concerned that he may have had another stroke. His first stroke occurred three years ago, and it affected his left side. The pt reports gradual worsening of left-sided weakness. The pt reports that he took his blood pressure medication as directed today; he takes 20 mg lisinopril and 0.5 tablet of Norvasc are once a day. He reports that he has also experienced base-line SOB, intermittent fleeting pain inferior to his left ribs, a cough, rhinorrhea, and a sore throat.  He has taken Mucinex Cough and Cold and Delsen for the cough and sore throat which he has had for a week; his last dose of Delsen was this morning.    The pt, who also has a h/o generalized seizures, also complains of a suspected seizure which occurred three hours ago. The pt has experienced seizures intermittently for approximately a year. However, he reports that the most recent seizure-like activity is dissimilar to his prior generalized seizures.  He states the spasms associated with the seizure today occurred for approximately two hours, and he describes the spasms as "violent jerking." He reports he is also  experiencing pain to his left arm and a frontal headache which began approximately 15 minutes.   Dr. Al Corpus is the pt's PCP at the [facility]. He was diagnosed at the [facility] with generalized seizures two weeks ago; Dr. Francine Graven is the neurologist at the [facility] who is treating the pt. The pt  was already taking glucosamine, Norvasc, and Neurotin, and he was then started on Remeron by the [place].     Past Medical History  Diagnosis Date  . Hyperthyroidism   . Chronic back pain     chronic narcotic use  . Degenerative disc disease   . Carpal tunnel syndrome   . Arthritis   . Hypertension   . Chronic hepatitis C 1980    never had any treatment  . CVA (cerebral infarction) 06/2010  . Anxiety   . Depression   . UTI (lower urinary tract infection)   . Stroke 2012    left hemiplegia  . Seizures     Epilepsy   Past Surgical History  Procedure Laterality Date  . Hand surgery      right  . Neck surgery    . Right hand reconstruction     Family History  Problem Relation Age of Onset  . Colon cancer Neg Hx   . Colon polyps Neg Hx   . Cervical cancer Mother   . Lung cancer Sister   . Breast cancer Sister   . Heart failure Father    History  Substance Use Topics  . Smoking status: Current Every Day Smoker -- 1.00 packs/day for 45 years    Types: Cigarettes  . Smokeless tobacco: Never Used  . Alcohol Use: Yes     Comment: 2 mo ago    Review of Systems  Constitutional: Negative for fever and chills.  HENT: Positive for sore throat.   Eyes: Positive for visual disturbance.  Respiratory: Positive for cough and shortness of breath.   Cardiovascular: Positive for chest pain (Pain below his left ribs. ). Negative for leg swelling.  Gastrointestinal: Positive for nausea and abdominal pain. Negative for vomiting, diarrhea, constipation and blood in stool.  Genitourinary: Negative for dysuria.  Musculoskeletal: Positive for myalgias and back pain (Baseline; DDD).  Skin: Negative  for rash.  Neurological: Positive for dizziness (Daily spells of dizziness since a prior stroke; has not worsened recently. ), seizures, light-headedness and headaches.  Hematological: Does not bruise/bleed easily.  Psychiatric/Behavioral: Positive for confusion (Short term memory loss).  All other systems reviewed and are negative.    Allergies  Morphine and related  Home Medications   Current Outpatient Rx  Name  Route  Sig  Dispense  Refill  . ALPRAZolam (XANAX) 0.5 MG tablet   Oral   Take 0.5 mg by mouth 3 (three) times daily as needed. For sleep and anxiety         . aspirin 81 MG tablet   Oral   Take 81 mg by mouth daily.          . DULoxetine (CYMBALTA) 60 MG capsule   Oral   Take 60 mg by mouth 2 (two) times daily.          . fish oil-omega-3 fatty acids 1000 MG capsule   Oral   Take 2 g by mouth daily.         . Glucosamine-Chondroitin (GLUCOSAMINE CHONDR COMPLEX PO)   Oral   Take 2 tablets by mouth daily.         Marland Kitchen HYDROcodone-acetaminophen (NORCO) 5-325 MG per tablet   Oral   Take 1 tablet by mouth every 6 (six) hours as needed. For pain          . lisinopril-hydrochlorothiazide (PRINZIDE,ZESTORETIC) 20-12.5 MG per tablet   Oral   Take 2 tablets by mouth daily.          . methimazole (TAPAZOLE) 10 MG tablet   Oral   Take 10 mg by mouth daily.         . mirtazapine (REMERON) 15 MG tablet   Oral   Take 15 mg by mouth at bedtime.         . Tamsulosin HCl (FLOMAX) 0.4 MG CAPS   Oral   Take 0.4 mg by mouth daily.          . traZODone (DESYREL) 50 MG tablet   Oral   Take 50 mg by mouth at bedtime.          Triage Vitals: BP 180/124  Pulse 94  Temp(Src) 98.1 F (36.7 C) (Oral)  Resp 18  Ht 5\' 8"  (1.727 m)  Wt 133 lb (60.328 kg)  BMI 20.23 kg/m2  SpO2 98%  Physical Exam  Nursing note and vitals reviewed. Constitutional: He is oriented to person, place, and time. He appears well-developed and well-nourished. No distress.   HENT:  Head: Normocephalic and atraumatic.  Eyes: EOM are normal.  Neck: Neck supple. No tracheal deviation present.  Cardiovascular: Normal rate, regular rhythm, normal heart sounds  and intact distal pulses.   No murmur heard. Pulmonary/Chest: Effort normal and breath sounds normal. No respiratory distress. He has no wheezes.  Abdominal: Bowel sounds are normal. There is no tenderness.  Musculoskeletal: Normal range of motion. He exhibits no edema.  Neurological: He is alert and oriented to person, place, and time. No cranial nerve deficit. Coordination normal.  Skin: Skin is warm and dry.  Psychiatric: He has a normal mood and affect. His behavior is normal.    ED Course  Procedures (including critical care time)  DIAGNOSTIC STUDIES: Oxygen Saturation is 98% on room air, normal by my interpretation.    COORDINATION OF CARE:  1:41 PM- Discussed treatment plan with patient, and the patient agreed to the plan.   Labs Review Labs Reviewed  PRO B NATRIURETIC PEPTIDE - Abnormal; Notable for the following:    Pro B Natriuretic peptide (BNP) 192.4 (*)    All other components within normal limits  CBC WITH DIFFERENTIAL  URINALYSIS, ROUTINE W REFLEX MICROSCOPIC  COMPREHENSIVE METABOLIC PANEL  LIPASE, BLOOD   Results for orders placed during the hospital encounter of 03/31/13  CBC WITH DIFFERENTIAL      Result Value Range   WBC 8.5  4.0 - 10.5 K/uL   RBC 4.59  4.22 - 5.81 MIL/uL   Hemoglobin 15.3  13.0 - 17.0 g/dL   HCT 47.8  29.5 - 62.1 %   MCV 93.0  78.0 - 100.0 fL   MCH 33.3  26.0 - 34.0 pg   MCHC 35.8  30.0 - 36.0 g/dL   RDW 30.8  65.7 - 84.6 %   Platelets 312  150 - 400 K/uL   Neutrophils Relative % 66  43 - 77 %   Neutro Abs 5.6  1.7 - 7.7 K/uL   Lymphocytes Relative 24  12 - 46 %   Lymphs Abs 2.0  0.7 - 4.0 K/uL   Monocytes Relative 10  3 - 12 %   Monocytes Absolute 0.8  0.1 - 1.0 K/uL   Eosinophils Relative 1  0 - 5 %   Eosinophils Absolute 0.1  0.0 - 0.7 K/uL    Basophils Relative 0  0 - 1 %   Basophils Absolute 0.0  0.0 - 0.1 K/uL  URINALYSIS, ROUTINE W REFLEX MICROSCOPIC      Result Value Range   Color, Urine YELLOW  YELLOW   APPearance CLEAR  CLEAR   Specific Gravity, Urine 1.020  1.005 - 1.030   pH 6.0  5.0 - 8.0   Glucose, UA NEGATIVE  NEGATIVE mg/dL   Hgb urine dipstick NEGATIVE  NEGATIVE   Bilirubin Urine NEGATIVE  NEGATIVE   Ketones, ur NEGATIVE  NEGATIVE mg/dL   Protein, ur NEGATIVE  NEGATIVE mg/dL   Urobilinogen, UA 0.2  0.0 - 1.0 mg/dL   Nitrite NEGATIVE  NEGATIVE   Leukocytes, UA NEGATIVE  NEGATIVE  COMPREHENSIVE METABOLIC PANEL      Result Value Range   Sodium 139  135 - 145 mEq/L   Potassium 3.9  3.5 - 5.1 mEq/L   Chloride 102  96 - 112 mEq/L   CO2 26  19 - 32 mEq/L   Glucose, Bld 88  70 - 99 mg/dL   BUN 12  6 - 23 mg/dL   Creatinine, Ser 9.62  0.50 - 1.35 mg/dL   Calcium 9.4  8.4 - 95.2 mg/dL   Total Protein 7.9  6.0 - 8.3 g/dL   Albumin 3.9  3.5 - 5.2 g/dL  AST 30  0 - 37 U/L   ALT 31  0 - 53 U/L   Alkaline Phosphatase 73  39 - 117 U/L   Total Bilirubin 0.4  0.3 - 1.2 mg/dL   GFR calc non Af Amer >90  >90 mL/min   GFR calc Af Amer >90  >90 mL/min  LIPASE, BLOOD      Result Value Range   Lipase 59  11 - 59 U/L  PRO B NATRIURETIC PEPTIDE      Result Value Range   Pro B Natriuretic peptide (BNP) 192.4 (*) 0 - 125 pg/mL    Imaging Review Dg Chest 2 View  03/31/2013   *RADIOLOGY REPORT*  Clinical Data: Memory loss, uncontrolled tremors, smoker with hypertension  CHEST - 2 VIEW  Comparison: 02/22/2013  Findings: The heart size and vascular pattern are normal.  The lungs are clear.  There is hyperinflation.  IMPRESSION: COPD.  No acute findings.   Original Report Authenticated By: Esperanza Heir, M.D.   Ct Head Wo Contrast  03/31/2013   CLINICAL DATA:  Short term memory loss for 1 year. Recent seizure disorder. Head trauma related to multiple ictal episodes. Marland Kitchen  EXAM: CT HEAD WITHOUT CONTRAST  TECHNIQUE: Contiguous  axial images were obtained from the base of the skull through the vertex without intravenous contrast.  COMPARISON:  01/24/2013 CT. MR 07/27/2012.  FINDINGS: There is an asymmetric hypodensity affecting the left centrum semiovale periventricular white matter (image 19 series 2) not clearly present previously suggesting acute or subacute deep white matter infarct. This may extend to involve posterior limb internal capsule on the left.  No hemorrhage, mass lesion, or cortical infarct. Brainstem hypodensity right paramedian pons represents an old lacune. No temporal lobe mass. Moderate premature atrophy. Mild chronic microvascular ischemic change. Calvarium intact. No paranasal sinus disease. Chronic fluid left mastoid.  IMPRESSION: Chronic changes as described. Left Periventricular hypodensity on today's exam was not present on previous CT or MR. subacute ischemia not excluded. The relationship of this finding to the patient's seizure disorder is unclear.   Electronically Signed   By: Davonna Belling M.D.   On: 03/31/2013 15:09    Date: 03/31/2013  Rate: 70  Rhythm: normal sinus rhythm  QRS Axis: normal  Intervals: normal  ST/T Wave abnormalities: nonspecific ST/T changes  Conduction Disutrbances:none  Narrative Interpretation:   Old EKG Reviewed: unchanged Today's EKG consistent with left ventricular hypertrophy. Compared to EKG from 02/04/2012 no significant change.   MDM   1. Hypertension   2. Headache   3. Seizures   4. Memory loss    The patient left AMA. Based on the patient's head CT which was not able to give him any of his lab results patient would've needed an MRI. No evidence of blood on head CT but possibility of stroke is present. Patient significantly hypertensive was not able to get any medications on board to help bring that down. He has a history of hypertension he is followed by the [place] in [place] by neurology for seizure disorder known to have a stroke in the past. Patient is on  Norvasc and lisinopril for hypertension states he's been taking of his blood pressures been getting increasingly high. Would plan to do an MRI with plan to get his blood pressure under better control the patient left AMA.  I personally performed the services described in this documentation, which was scribed in my presence. The recorded information has been reviewed and is accurate.  Shelda Jakes, MD 03/31/13 1647  Shelda Jakes, MD 03/31/13 [phone]

## 2013-03-31 NOTE — ED Notes (Signed)
Pt is tearful at triage. States involuntary movements of body began 1-2 hours PTA. Occurred yesterday also but not as bad. Hypertensive today. States he is having memory loss. Unable to remember things that occurred yesterday.
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
## 2013-03-31 NOTE — ED Notes (Signed)
Spoke with Dr Ruthy Dick that pt requesting  pain medication. Pt states that he will leave the hospital in 30 minutes if he doesn't get something.

## 2013-03-31 NOTE — ED Notes (Signed)
Spoke with

## 2013-03-31 NOTE — ED Notes (Addendum)
Pt loud and yelling out states "pull this IV out!" upon entering room pt had IV almost all the way out completed d/c IV and pt signed AMA papers. Pt stable left ER ambulatory

## 2013-04-08 ENCOUNTER — Emergency Department (HOSPITAL_COMMUNITY)
Admission: EM | Admit: 2013-04-08 | Discharge: 2013-04-08 | Discharge status: Against Medical Advice | Payer: Non-veteran care | Attending: Emergency Medicine | Admitting: Emergency Medicine

## 2013-04-08 ENCOUNTER — Encounter (HOSPITAL_COMMUNITY): Payer: Self-pay | Admitting: Emergency Medicine

## 2013-04-08 DIAGNOSIS — F411 Generalized anxiety disorder: Secondary | ICD-10-CM | POA: Insufficient documentation

## 2013-04-08 DIAGNOSIS — Z7982 Long term (current) use of aspirin: Secondary | ICD-10-CM | POA: Diagnosis not present

## 2013-04-08 DIAGNOSIS — Z87440 Personal history of urinary (tract) infections: Secondary | ICD-10-CM | POA: Insufficient documentation

## 2013-04-08 DIAGNOSIS — Y92410 Unspecified street and highway as the place of occurrence of the external cause: Secondary | ICD-10-CM | POA: Insufficient documentation

## 2013-04-08 DIAGNOSIS — Y9389 Activity, other specified: Secondary | ICD-10-CM | POA: Diagnosis not present

## 2013-04-08 DIAGNOSIS — M129 Arthropathy, unspecified: Secondary | ICD-10-CM | POA: Insufficient documentation

## 2013-04-08 DIAGNOSIS — M549 Dorsalgia, unspecified: Secondary | ICD-10-CM | POA: Insufficient documentation

## 2013-04-08 DIAGNOSIS — Z79899 Other long term (current) drug therapy: Secondary | ICD-10-CM | POA: Diagnosis not present

## 2013-04-08 DIAGNOSIS — E0590 Thyrotoxicosis, unspecified without thyrotoxic crisis or storm: Secondary | ICD-10-CM | POA: Insufficient documentation

## 2013-04-08 DIAGNOSIS — G8929 Other chronic pain: Secondary | ICD-10-CM | POA: Diagnosis not present

## 2013-04-08 DIAGNOSIS — Z8619 Personal history of other infectious and parasitic diseases: Secondary | ICD-10-CM | POA: Diagnosis not present

## 2013-04-08 DIAGNOSIS — F3289 Other specified depressive episodes: Secondary | ICD-10-CM | POA: Insufficient documentation

## 2013-04-08 DIAGNOSIS — I10 Essential (primary) hypertension: Secondary | ICD-10-CM | POA: Insufficient documentation

## 2013-04-08 DIAGNOSIS — F329 Major depressive disorder, single episode, unspecified: Secondary | ICD-10-CM | POA: Insufficient documentation

## 2013-04-08 DIAGNOSIS — S0990XA Unspecified injury of head, initial encounter: Secondary | ICD-10-CM | POA: Insufficient documentation

## 2013-04-08 DIAGNOSIS — IMO0002 Reserved for concepts with insufficient information to code with codable children: Secondary | ICD-10-CM | POA: Diagnosis not present

## 2013-04-08 DIAGNOSIS — F17200 Nicotine dependence, unspecified, uncomplicated: Secondary | ICD-10-CM | POA: Insufficient documentation

## 2013-04-08 DIAGNOSIS — Z8673 Personal history of transient ischemic attack (TIA), and cerebral infarction without residual deficits: Secondary | ICD-10-CM | POA: Insufficient documentation
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
## 2013-04-08 NOTE — ED Notes (Signed)
Pt acting very irate. [employer] at bedside. Pt refusing treatment and wants to leave AMA.
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
## 2013-04-08 NOTE — ED Notes (Addendum)
Pt left AMA. Dr. Silverio Lay aware that pt leaving and refusing to sign.

## 2013-07-23 ENCOUNTER — Emergency Department (HOSPITAL_COMMUNITY)
Admission: EM | Admit: 2013-07-23 | Discharge: 2013-07-25 | Disposition: A | Discharge status: Other Acute Inpatient Hospital | Payer: Non-veteran care | Attending: Emergency Medicine | Admitting: Emergency Medicine

## 2013-07-23 ENCOUNTER — Encounter (HOSPITAL_COMMUNITY): Payer: Self-pay | Admitting: Emergency Medicine

## 2013-07-23 DIAGNOSIS — F32A Depression, unspecified: Secondary | ICD-10-CM

## 2013-07-23 DIAGNOSIS — M129 Arthropathy, unspecified: Secondary | ICD-10-CM | POA: Insufficient documentation

## 2013-07-23 DIAGNOSIS — F411 Generalized anxiety disorder: Secondary | ICD-10-CM | POA: Insufficient documentation

## 2013-07-23 DIAGNOSIS — Z8669 Personal history of other diseases of the nervous system and sense organs: Secondary | ICD-10-CM | POA: Insufficient documentation

## 2013-07-23 DIAGNOSIS — Z8619 Personal history of other infectious and parasitic diseases: Secondary | ICD-10-CM | POA: Insufficient documentation

## 2013-07-23 DIAGNOSIS — F17200 Nicotine dependence, unspecified, uncomplicated: Secondary | ICD-10-CM | POA: Insufficient documentation

## 2013-07-23 DIAGNOSIS — F419 Anxiety disorder, unspecified: Secondary | ICD-10-CM

## 2013-07-23 DIAGNOSIS — Z87440 Personal history of urinary (tract) infections: Secondary | ICD-10-CM | POA: Insufficient documentation

## 2013-07-23 DIAGNOSIS — E0590 Thyrotoxicosis, unspecified without thyrotoxic crisis or storm: Secondary | ICD-10-CM | POA: Insufficient documentation

## 2013-07-23 DIAGNOSIS — G8929 Other chronic pain: Secondary | ICD-10-CM | POA: Insufficient documentation

## 2013-07-23 DIAGNOSIS — M549 Dorsalgia, unspecified: Secondary | ICD-10-CM | POA: Insufficient documentation

## 2013-07-23 DIAGNOSIS — IMO0002 Reserved for concepts with insufficient information to code with codable children: Secondary | ICD-10-CM | POA: Insufficient documentation

## 2013-07-23 DIAGNOSIS — I69959 Hemiplegia and hemiparesis following unspecified cerebrovascular disease affecting unspecified side: Secondary | ICD-10-CM | POA: Insufficient documentation

## 2013-07-23 DIAGNOSIS — F329 Major depressive disorder, single episode, unspecified: Secondary | ICD-10-CM | POA: Insufficient documentation

## 2013-07-23 DIAGNOSIS — R45851 Suicidal ideations: Secondary | ICD-10-CM | POA: Insufficient documentation

## 2013-07-23 DIAGNOSIS — Z7982 Long term (current) use of aspirin: Secondary | ICD-10-CM | POA: Insufficient documentation

## 2013-07-23 DIAGNOSIS — S0990XA Unspecified injury of head, initial encounter: Secondary | ICD-10-CM

## 2013-07-23 DIAGNOSIS — Z79899 Other long term (current) drug therapy: Secondary | ICD-10-CM | POA: Insufficient documentation

## 2013-07-23 DIAGNOSIS — R4182 Altered mental status, unspecified: Secondary | ICD-10-CM | POA: Insufficient documentation

## 2013-07-23 DIAGNOSIS — F3289 Other specified depressive episodes: Secondary | ICD-10-CM | POA: Insufficient documentation

## 2013-07-23 DIAGNOSIS — I10 Essential (primary) hypertension: Secondary | ICD-10-CM | POA: Insufficient documentation

## 2013-07-23 LAB — CBC WITH DIFFERENTIAL/PLATELET
Basophils Absolute: 0 10*3/uL (ref 0.0–0.1)
Basophils Relative: 0 % (ref 0–1)
Eosinophils Absolute: 0.1 10*3/uL (ref 0.0–0.7)
Eosinophils Relative: 1 % (ref 0–5)
HCT: 47.2 % (ref 39.0–52.0)
Hemoglobin: 16.8 g/dL (ref 13.0–17.0)
Lymphocytes Relative: 51 % — ABNORMAL HIGH (ref 12–46)
Lymphs Abs: 4.8 10*3/uL — ABNORMAL HIGH (ref 0.7–4.0)
MCH: 32.5 pg (ref 26.0–34.0)
MCHC: 35.6 g/dL (ref 30.0–36.0)
MCV: 91.3 fL (ref 78.0–100.0)
Monocytes Absolute: 1.3 10*3/uL — ABNORMAL HIGH (ref 0.1–1.0)
Monocytes Relative: 14 % — ABNORMAL HIGH (ref 3–12)
Neutro Abs: 3.1 10*3/uL (ref 1.7–7.7)
Neutrophils Relative %: 34 % — ABNORMAL LOW (ref 43–77)
Platelets: 252 10*3/uL (ref 150–400)
RBC: 5.17 MIL/uL (ref 4.22–5.81)
RDW: 13.4 % (ref 11.5–15.5)
WBC: 9.4 10*3/uL (ref 4.0–10.5)

## 2013-07-23 LAB — RAPID URINE DRUG SCREEN, HOSP PERFORMED
Amphetamines: NOT DETECTED
Barbiturates: POSITIVE — AB
Benzodiazepines: POSITIVE — AB
Cocaine: NOT DETECTED
Opiates: NOT DETECTED
Tetrahydrocannabinol: POSITIVE — AB

## 2013-07-23 LAB — COMPREHENSIVE METABOLIC PANEL
ALT: 39 U/L (ref 0–53)
AST: 35 U/L (ref 0–37)
Albumin: 4.2 g/dL (ref 3.5–5.2)
Alkaline Phosphatase: 68 U/L (ref 39–117)
BUN: 10 mg/dL (ref 6–23)
CO2: 23 mEq/L (ref 19–32)
Calcium: 9.5 mg/dL (ref 8.4–10.5)
Chloride: 106 mEq/L (ref 96–112)
Creatinine, Ser: 0.9 mg/dL (ref 0.50–1.35)
GFR calc Af Amer: >90 mL/min (ref >90)
GFR calc non Af Amer: 89 mL/min — ABNORMAL LOW (ref >90)
Glucose, Bld: 86 mg/dL (ref 70–99)
Potassium: 4.3 mEq/L (ref 3.7–5.3)
Sodium: 144 mEq/L (ref 137–147)
Total Bilirubin: 0.6 mg/dL (ref 0.3–1.2)
Total Protein: 8 g/dL (ref 6.0–8.3)

## 2013-07-23 LAB — URINALYSIS, ROUTINE W REFLEX MICROSCOPIC
Bilirubin Urine: NEGATIVE
Glucose, UA: NEGATIVE mg/dL
Hgb urine dipstick: NEGATIVE
Ketones, ur: NEGATIVE mg/dL
Leukocytes, UA: NEGATIVE
Nitrite: NEGATIVE
Protein, ur: NEGATIVE mg/dL
Specific Gravity, Urine: <1.005 — ABNORMAL LOW (ref 1.005–1.030)
Urobilinogen, UA: 0.2 mg/dL (ref 0.0–1.0)
pH: 6.5 (ref 5.0–8.0)

## 2013-07-23 LAB — ETHANOL: Alcohol, Ethyl (B): <11 mg/dL (ref 0–11)

## 2013-07-23 MED ORDER — TAMSULOSIN HCL 0.4 MG PO CAPS
0.4000 mg | ORAL_CAPSULE | Freq: Every day | ORAL | Status: DC
Start: 2013-07-24 — End: 2013-07-25
  Administered 2013-07-24 – 2013-07-25 (×2): 0.4 mg via ORAL
  Filled 2013-07-23 (×6): qty 1

## 2013-07-23 MED ORDER — TRAZODONE HCL 50 MG PO TABS
50.0000 mg | ORAL_TABLET | Freq: Every day | ORAL | Status: DC
  Administered 2013-07-24 (×2): 50 mg via ORAL
  Filled 2013-07-23 (×2): qty 1

## 2013-07-23 MED ORDER — LISINOPRIL-HYDROCHLOROTHIAZIDE 20-12.5 MG PO TABS: 2.0000 | ORAL_TABLET | Freq: Every day | ORAL | Status: DC

## 2013-07-23 MED ORDER — ASPIRIN 81 MG PO CHEW
81.0000 mg | CHEWABLE_TABLET | Freq: Every day | ORAL | Status: DC
  Administered 2013-07-24 – 2013-07-25 (×2): 81 mg via ORAL
  Filled 2013-07-23 (×2): qty 1

## 2013-07-23 MED ORDER — MIRTAZAPINE 15 MG PO TABS
15.0000 mg | ORAL_TABLET | Freq: Every day | ORAL | Status: DC
  Administered 2013-07-24 (×2): 15 mg via ORAL
  Filled 2013-07-23 (×6): qty 1

## 2013-07-23 MED ORDER — GLUCOSAMINE-CHONDROITIN 500-400 MG PO CAPS: ORAL_CAPSULE | Freq: Once | ORAL | Status: DC

## 2013-07-23 MED ORDER — METHIMAZOLE 5 MG PO TABS
10.0000 mg | ORAL_TABLET | Freq: Every day | ORAL | Status: DC
  Administered 2013-07-24 – 2013-07-25 (×2): 10 mg via ORAL
  Filled 2013-07-23: qty 2
  Filled 2013-07-23: qty 1
  Filled 2013-07-23: qty 2
  Filled 2013-07-23: qty 1
  Filled 2013-07-23: qty 2

## 2013-07-23 MED ORDER — HYDROCODONE-ACETAMINOPHEN 5-325 MG PO TABS
1.0000 | ORAL_TABLET | Freq: Four times a day (QID) | ORAL | Status: DC | PRN
  Administered 2013-07-25: 1 via ORAL
  Filled 2013-07-23: qty 1

## 2013-07-23 MED ORDER — DULOXETINE HCL 60 MG PO CPEP
60.0000 mg | ORAL_CAPSULE | Freq: Two times a day (BID) | ORAL | Status: DC
  Administered 2013-07-24 – 2013-07-25 (×4): 60 mg via ORAL
  Filled 2013-07-23 (×12): qty 1

## 2013-07-23 MED ORDER — ALPRAZOLAM 0.5 MG PO TABS: 0.5000 mg | ORAL_TABLET | Freq: Three times a day (TID) | ORAL | Status: DC | PRN
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
## 2013-07-23 NOTE — ED Notes (Signed)
Behavioral Health on the phone with wife at this time

## 2013-07-23 NOTE — BH Assessment (Signed)
Received call for tele-assessment. Spoke Dr. Roderic Palau who got angry with family, became aggressive and then made suicidal threats. He is currently denying SI or HI. Tele-assessment will be initiated.  Eric Tran, [facility] Triage Specialist

## 2013-07-23 NOTE — ED Provider Notes (Signed)
CSN: [id number]     Arrival date & time 07/23/13  1916 History   First MD Initiated Contact with Patient 07/23/13 2021     Chief Complaint  Patient presents with  . Medical Clearance   (Consider location/radiation/quality/duration/timing/severity/associated sxs/prior Treatment) Patient is a 63 y.o. male presenting with altered mental status. The history is provided by the police (the pt has been committed for suicidal thoughts).  Altered Mental Status Presenting symptoms: behavior changes   Severity:  Severe Most recent episode:  Today Episode history:  Multiple Timing:  Constant Progression:  Worsening Chronicity:  Recurrent Context: not alcohol use   Associated symptoms: agitation   Associated symptoms: no abdominal pain, no hallucinations, no headaches, no rash and no seizures     Past Medical History  Diagnosis Date  . Hyperthyroidism   . Chronic back pain     chronic narcotic use  . Degenerative disc disease   . Carpal tunnel syndrome   . Arthritis   . Hypertension   . Chronic hepatitis C 1980    never had any treatment  . CVA (cerebral infarction) 06/2010  . Anxiety   . Depression   . UTI (lower urinary tract infection)   . Stroke 2012    left hemiplegia  . Seizures     Epilepsy   Past Surgical History  Procedure Laterality Date  . Hand surgery      right  . Neck surgery    . Right hand reconstruction     Family History  Problem Relation Age of Onset  . Colon cancer Neg Hx   . Colon polyps Neg Hx   . Cervical cancer Mother   . Lung cancer Sister   . Breast cancer Sister   . Heart failure Father    History  Substance Use Topics  . Smoking status: Current Every Day Smoker -- 1.00 packs/day for 45 years    Types: Cigarettes  . Smokeless tobacco: Never Used  . Alcohol Use: 1.2 oz/week    2 Cans of beer per week     Comment: 2 beers a month    Review of Systems  Constitutional: Negative for appetite change and fatigue.  HENT: Negative for  congestion, ear discharge and sinus pressure.   Eyes: Negative for discharge.  Respiratory: Negative for cough.   Cardiovascular: Negative for chest pain.  Gastrointestinal: Negative for abdominal pain and diarrhea.  Genitourinary: Negative for frequency and hematuria.  Musculoskeletal: Negative for back pain.  Skin: Negative for rash.  Neurological: Negative for seizures and headaches.  Psychiatric/Behavioral: Positive for agitation. Negative for hallucinations.    Allergies  Morphine and related  Home Medications   Current Outpatient Rx  Name  Route  Sig  Dispense  Refill  . ALPRAZolam (XANAX) 0.5 MG tablet   Oral   Take 0.5 mg by mouth 3 (three) times daily as needed. For sleep and anxiety         . aspirin 81 MG tablet   Oral   Take 81 mg by mouth daily.          . DULoxetine (CYMBALTA) 60 MG capsule   Oral   Take 60 mg by mouth 2 (two) times daily.          . fish oil-omega-3 fatty acids 1000 MG capsule   Oral   Take 2 g by mouth daily.         . Glucosamine-Chondroitin (GLUCOSAMINE CHONDR COMPLEX PO)   Oral   Take 2 tablets  by mouth daily.         Marland Kitchen HYDROcodone-acetaminophen (NORCO) 5-325 MG per tablet   Oral   Take 1 tablet by mouth every 6 (six) hours as needed. For pain          . lisinopril-hydrochlorothiazide (PRINZIDE,ZESTORETIC) 20-12.5 MG per tablet   Oral   Take 2 tablets by mouth daily.          . methimazole (TAPAZOLE) 10 MG tablet   Oral   Take 10 mg by mouth daily.         . mirtazapine (REMERON) 15 MG tablet   Oral   Take 15 mg by mouth at bedtime.         . Tamsulosin HCl (FLOMAX) 0.4 MG CAPS   Oral   Take 0.4 mg by mouth daily.          . traZODone (DESYREL) 50 MG tablet   Oral   Take 50 mg by mouth at bedtime.          BP 157/129  Pulse 121  Temp(Src) 98.3 F (36.8 C) (Oral)  Resp 16  Ht 5\' 8"  (1.727 m)  Wt 135 lb (61.236 kg)  BMI 20.53 kg/m2  SpO2 99% Physical Exam  Constitutional: He is oriented  to person, place, and time. He appears well-developed.  HENT:  Head: Normocephalic.  Eyes: Conjunctivae and EOM are normal. No scleral icterus.  Neck: Neck supple. No thyromegaly present.  Cardiovascular: Normal rate and regular rhythm.  Exam reveals no gallop and no friction rub.   No murmur heard. Pulmonary/Chest: No stridor. He has no wheezes. He has no rales. He exhibits no tenderness.  Abdominal: He exhibits no distension. There is no tenderness. There is no rebound.  Musculoskeletal: Normal range of motion. He exhibits no edema.  Lymphadenopathy:    He has no cervical adenopathy.  Neurological: He is oriented to person, place, and time. He exhibits normal muscle tone. Coordination normal.  Skin: No rash noted. No erythema.  Psychiatric:  suicidal    ED Course  Procedures (including critical care time) Labs Review Labs Reviewed  CBC WITH DIFFERENTIAL - Abnormal; Notable for the following:    Neutrophils Relative % 34 (*)    Lymphocytes Relative 51 (*)    Lymphs Abs 4.8 (*)    Monocytes Relative 14 (*)    Monocytes Absolute 1.3 (*)    All other components within normal limits  COMPREHENSIVE METABOLIC PANEL - Abnormal; Notable for the following:    GFR calc non Af Amer 89 (*)    All other components within normal limits  URINE RAPID DRUG SCREEN (HOSP PERFORMED) - Abnormal; Notable for the following:    Benzodiazepines POSITIVE (*)    Tetrahydrocannabinol POSITIVE (*)    Barbiturates POSITIVE (*)    All other components within normal limits  URINALYSIS, ROUTINE W REFLEX MICROSCOPIC - Abnormal; Notable for the following:    Color, Urine AMBER (*)    Specific Gravity, Urine <1.005 (*)    All other components within normal limits  ETHANOL   Imaging Review No results found.  EKG Interpretation   None     pt seen by psyc and they feel like he needs placement  MDM  suicidal   Maudry Diego, MD 07/23/13 2354

## 2013-07-23 NOTE — ED Notes (Signed)
Pt hungry, fed pt meal tray

## 2013-07-23 NOTE — ED Notes (Signed)
Eric Tran Wife  [phone]

## 2013-07-23 NOTE — ED Notes (Signed)
Telepsy in process 

## 2013-07-24 ENCOUNTER — Encounter (HOSPITAL_COMMUNITY): Payer: Self-pay | Admitting: Psychiatry

## 2013-07-24 ENCOUNTER — Emergency Department (HOSPITAL_COMMUNITY): Payer: Self-pay

## 2013-07-24 DIAGNOSIS — F3289 Other specified depressive episodes: Secondary | ICD-10-CM

## 2013-07-24 DIAGNOSIS — S0990XA Unspecified injury of head, initial encounter: Secondary | ICD-10-CM | POA: Diagnosis present

## 2013-07-24 DIAGNOSIS — F329 Major depressive disorder, single episode, unspecified: Secondary | ICD-10-CM

## 2013-07-24 DIAGNOSIS — F411 Generalized anxiety disorder: Secondary | ICD-10-CM

## 2013-07-24 MED ORDER — MIRTAZAPINE 15 MG PO TBDP
ORAL_TABLET | ORAL | Status: AC
  Filled 2013-07-24: qty 1

## 2013-07-24 MED ORDER — DULOXETINE HCL 60 MG PO CPEP
ORAL_CAPSULE | ORAL | Status: AC
  Filled 2013-07-24: qty 1

## 2013-07-24 MED ORDER — HYDROCHLOROTHIAZIDE 12.5 MG PO CAPS
12.5000 mg | ORAL_CAPSULE | Freq: Every day | ORAL | Status: DC
  Administered 2013-07-24 – 2013-07-25 (×2): 12.5 mg via ORAL
  Filled 2013-07-24 (×6): qty 1

## 2013-07-24 MED ORDER — LISINOPRIL 10 MG PO TABS
20.0000 mg | ORAL_TABLET | Freq: Every day | ORAL | Status: DC
  Administered 2013-07-24 – 2013-07-25 (×2): 20 mg via ORAL
  Filled 2013-07-24 (×2): qty 2
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
## 2013-07-24 NOTE — BH Assessment (Signed)
[place] Assessment Progress Note   Referral to [facility] faxed to pt's nurse to give to pt before he is discharged per recommendation of Waylan Boga, NP.  Shaune Pascal, MS, [facility] Licensed Professional Counselor Triage Specialist

## 2013-07-24 NOTE — ED Provider Notes (Addendum)
Pt who was IVCed yesterday for SI is now denying SI.  He states he wasn't suicidal yesterday and has never been in his life.  He denies HI.  He states he drinks "a couple beers a month."  Patient seen by psychiatry and felt safe for discharge home and followup with the [place]. He denies any suicidal or homicidal ideation. He is alert and oriented x3. He denies any complaint.  Patient's wife called and is concerned with discharge. D/w Waylan Boga who feels patient is at his baseline and not a threat to himself or others.  He is stable for followup at the [place].  BP 156/104  Pulse 67  Temp(Src) 98.7 F (37.1 C) (Oral)  Resp 16  Ht 5\' 8"  (1.727 m)  Wt 135 lb (61.236 kg)  BMI 20.53 kg/m2  SpO2 100%  Patient's family is worried about his discharge. Do not feel safe. They feel he is being manipulative and hiding suicidality. Information was relayed to psychiatry team. Repeat telepsych evaluation will be completed with family involved.  After discussion with family and psychiatry team, patient IVC paperwork will be continued and placement will be sought at the [place].   I personally performed the services described in this documentation, which was scribed in my presence. The recorded information has been reviewed and is accurate.   Ezequiel Essex, MD 07/24/13 1429  Ezequiel Essex, MD 07/24/13 [phone]

## 2013-07-24 NOTE — BH Assessment (Signed)
Pt was seen and assessed by Waylan Boga NP, Theodoro Clock NP recommended that patient be discharged due to patient reporting being negative SI/HI, no AH/VH noted. This Probation officer received a call from Doylene Canard RN at [facility] stating that patient's family had some concerns about patient being discharged and that the family reported that they taped patient stating that he was going to kill himself. Pt's wife and son reported that they could not take patient home, and that they felt patient needed to be in the hospital for further evaluations. This Probation officer spoke to Waylan Boga NP, and Dr. Marily Memos regarding the new information. Dr. Marily Memos recommended that patient remain under IVC, and that due to his recent TBI's that patient be referred to social work for placement at the [facility]. Doylene Canard RN charge nurse at [place] made aware of Dr. Lanae Boast recommendations, and Dr. Wyvonnia Dusky at [place] made aware.

## 2013-07-24 NOTE — ED Notes (Signed)
Wife called for update. State pt was scheduled to get MRI on Monday in [place]. Asking if we can do MRI

## 2013-07-24 NOTE — Progress Notes (Signed)
Pt has been declined per Tracey by Dr. Phil Dopp d/t acuity.   Eric Tran Disposition MHT

## 2013-07-24 NOTE — Progress Notes (Addendum)
Pt situation has been discussed with [facility] psych NP, [facility] psychiatrist, [facility] and [facility].  Has been determined that pt be transferred to the [facility] for psych services and IVC remain in place. The following VA hospitals have been called regarding inptx:   [facility]- per Jeneen Rinks AOD they currently have no beds available and haven't had any the past 6 days, are on diversion and will not look at any referrals at this time [facility]- per Remo Lipps they are currently at capacity but can fax information for review [facility]- per Reggie they are capacity but could possibly have D/C's tomorrow, can fax information for review to [phone] including IVC paper work , progress notes, assessments, and UDS on pt [place]- per Karlene Einstein they are under diversion and not accepting pt's or looking at referrals and at [place] Disposition MHT

## 2013-07-24 NOTE — BH Assessment (Signed)
Received call from [facility]. Pt is declined due to aggressive behavior.  Orpah Greek Rosana Hoes, [facility] Triage Specialist

## 2013-07-24 NOTE — Progress Notes (Signed)
Placed phone call to [facility] to speak with pt's nurse to get copy of pt's IVC paper work and fax VA packet for pt's MD and pt to sign, at that time was reported that pt's nurse was with a critical pt and could not receive phone call but would call back.  Call has not been received back as of yet regarding pt so paper work for [facility] could not be initiated.  Will pas along to oncoming TTS staff.   Rick Duff Disposition MHT

## 2013-07-24 NOTE — BH Assessment (Signed)
Per Okey Regal, [facility] at [facility], adult unit is at capacity. Contacted the following facilities for placement:  [facility]: Per Santiago Glad, at [place]: Per Geni Bers, at [facility]: At [facility]: At [place]: No answer [facility]: At [facility]: At [place]: At [place]: At [place]: At [facility]: At [place]: At [place]: Bed available. Faxed clinical information [facility]: Bed available. Faxed clinical information   Orpah Greek Rosana Hoes, [facility] Triage Specialist

## 2013-07-24 NOTE — Progress Notes (Signed)
Consulted with Education officer, museum regarding patient.

## 2013-07-24 NOTE — ED Notes (Signed)
Pt officer pt in fine one minute, talking and the next he is coming out of the bed up in his face. Pt only cuffed one leg and one arm. Officer states to watch out for pt quick mood change when entering the room.

## 2013-07-24 NOTE — Discharge Instructions (Signed)

## 2013-07-25 ENCOUNTER — Encounter (HOSPITAL_COMMUNITY): Payer: Self-pay | Admitting: Emergency Medicine

## 2013-07-25 NOTE — BH Assessment (Signed)
Ernie Avena called from [place], [facility] requesting the following information. Fax [phone]. MHT Bruce contacted to assist in this patient's dis positioning. Writer did not reach MHT, writer will start the process.   1. IVC  2. H&P 3. Nursing notes/progress notes 4. Labs 5. Current Medication list 6. Completed VA referral packet

## 2013-07-25 NOTE — ED Notes (Signed)
[place], gave report to [facility].  [place]

## 2013-07-25 NOTE — Progress Notes (Signed)
B.Tyneka Scafidi, MHT received report from [facility] at [facility] that patient has been accepted for treatment by Dr. Scarlette Calico and is assigned to ward 5-C nurse to give report at [phone] ext [phone]. Writer informed attending nurse Amber of disposition who will facilitate patient transfer.

## 2013-07-25 NOTE — ED Notes (Signed)
Social work called.  States that there are no beds available at [facility].  States that pt has been placed on the waiting list and there are 2 pt's in front of him.

## 2013-07-25 NOTE — BH Assessment (Signed)
Pt referred to the [facility] medical center earlier today. Received a call from Dierdre Harness [phone] with the [facility]. Sts that their facility does not have any beds at this time but may have beds later or tomorrow. He asked that patient's IVC is faxed to [phone] and also would like patient to complete a financial verification form. Writer will have the above task completed and then follow up with the [place] regarding patient's potential bed status.

## 2013-07-25 NOTE — Clinical Social Work Note (Signed)
CSW received call re patient disposition, per [facility] assessment Marlou Porch), patient has bed at [facility].  Appears that patient and MD must complete paperwork for [facility] - [place] determining what paperwork should have been received at [place], will fax to [place] and CSW will deliver to [place] when received.  RN CM (Amy) working on paperwork for patient transfer to [place].  Patient is currently under IVC, [facility] will fax paperwork to [facility] to add to packet.  CSW will follow peripherally, RN CM working on [place] transfer.  Edwyna Shell, LCSW Clinical Social Worker [phone])

## 2013-07-25 NOTE — ED Notes (Signed)
Wife [phone]
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
## 2013-07-25 NOTE — Progress Notes (Signed)
CM advised that Eric Tran has an available bed. Spoke to [place] with [place] transfers. He states there are two pts ahead of this pt for bed. When bed available, he will contact me and send packet (different from transfer packet CM is familiar with). CSW advised that bed is not available.

## 2013-07-25 NOTE — Progress Notes (Signed)
Eric Tran, MHT received call from [facility] who has requested referral packet information. In take clinician will fax additional forms to be completed and included with referral packet. Writer will complete forms and submit including labs, IVC documents, HPI, and assessment note.

## 2013-07-25 NOTE — Progress Notes (Signed)
Eric Tran, MHT made efforts to secure placement by contacting the following facilities listed below;  [facility] [place] reports being at capacity but can fax referral, referral has been submitted for review [facility] on diversion [place] at [place] left message inquiring of bed availability

## 2013-08-12 ENCOUNTER — Encounter (HOSPITAL_COMMUNITY): Payer: Self-pay | Admitting: Emergency Medicine

## 2013-08-12 ENCOUNTER — Emergency Department (HOSPITAL_COMMUNITY): Payer: Non-veteran care

## 2013-08-12 ENCOUNTER — Emergency Department (HOSPITAL_COMMUNITY)
Admission: EM | Admit: 2013-08-12 | Discharge: 2013-08-12 | Disposition: A | Discharge status: Home / Self Care | Payer: Non-veteran care | Attending: Emergency Medicine | Admitting: Emergency Medicine

## 2013-08-12 DIAGNOSIS — Z79899 Other long term (current) drug therapy: Secondary | ICD-10-CM | POA: Insufficient documentation

## 2013-08-12 DIAGNOSIS — F29 Unspecified psychosis not due to a substance or known physiological condition: Secondary | ICD-10-CM | POA: Insufficient documentation

## 2013-08-12 DIAGNOSIS — M549 Dorsalgia, unspecified: Secondary | ICD-10-CM | POA: Insufficient documentation

## 2013-08-12 DIAGNOSIS — I10 Essential (primary) hypertension: Secondary | ICD-10-CM | POA: Insufficient documentation

## 2013-08-12 DIAGNOSIS — R51 Headache: Secondary | ICD-10-CM | POA: Insufficient documentation

## 2013-08-12 DIAGNOSIS — Z862 Personal history of diseases of the blood and blood-forming organs and certain disorders involving the immune mechanism: Secondary | ICD-10-CM | POA: Insufficient documentation

## 2013-08-12 DIAGNOSIS — G40909 Epilepsy, unspecified, not intractable, without status epilepticus: Secondary | ICD-10-CM | POA: Insufficient documentation

## 2013-08-12 DIAGNOSIS — F3289 Other specified depressive episodes: Secondary | ICD-10-CM | POA: Insufficient documentation

## 2013-08-12 DIAGNOSIS — R404 Transient alteration of awareness: Secondary | ICD-10-CM | POA: Insufficient documentation

## 2013-08-12 DIAGNOSIS — G8929 Other chronic pain: Secondary | ICD-10-CM | POA: Insufficient documentation

## 2013-08-12 DIAGNOSIS — R011 Cardiac murmur, unspecified: Secondary | ICD-10-CM | POA: Insufficient documentation

## 2013-08-12 DIAGNOSIS — Z8619 Personal history of other infectious and parasitic diseases: Secondary | ICD-10-CM | POA: Insufficient documentation

## 2013-08-12 DIAGNOSIS — M129 Arthropathy, unspecified: Secondary | ICD-10-CM | POA: Insufficient documentation

## 2013-08-12 DIAGNOSIS — R4020 Unspecified coma: Secondary | ICD-10-CM

## 2013-08-12 DIAGNOSIS — R55 Syncope and collapse: Secondary | ICD-10-CM | POA: Insufficient documentation

## 2013-08-12 DIAGNOSIS — F329 Major depressive disorder, single episode, unspecified: Secondary | ICD-10-CM | POA: Insufficient documentation

## 2013-08-12 DIAGNOSIS — R21 Rash and other nonspecific skin eruption: Secondary | ICD-10-CM

## 2013-08-12 DIAGNOSIS — IMO0002 Reserved for concepts with insufficient information to code with codable children: Secondary | ICD-10-CM | POA: Insufficient documentation

## 2013-08-12 DIAGNOSIS — F411 Generalized anxiety disorder: Secondary | ICD-10-CM | POA: Insufficient documentation

## 2013-08-12 DIAGNOSIS — Z7982 Long term (current) use of aspirin: Secondary | ICD-10-CM | POA: Insufficient documentation

## 2013-08-12 DIAGNOSIS — Z8673 Personal history of transient ischemic attack (TIA), and cerebral infarction without residual deficits: Secondary | ICD-10-CM | POA: Insufficient documentation

## 2013-08-12 DIAGNOSIS — Z8639 Personal history of other endocrine, nutritional and metabolic disease: Secondary | ICD-10-CM | POA: Insufficient documentation

## 2013-08-12 DIAGNOSIS — Z87440 Personal history of urinary (tract) infections: Secondary | ICD-10-CM | POA: Insufficient documentation

## 2013-08-12 DIAGNOSIS — Z87828 Personal history of other (healed) physical injury and trauma: Secondary | ICD-10-CM | POA: Insufficient documentation

## 2013-08-12 HISTORY — DX: Unspecified injury of head, initial encounter: S09.90XA

## 2013-08-12 LAB — COMPREHENSIVE METABOLIC PANEL
ALT: 39 U/L (ref 0–53)
AST: 29 U/L (ref 0–37)
Albumin: 3.6 g/dL (ref 3.5–5.2)
Alkaline Phosphatase: 63 U/L (ref 39–117)
BUN: 14 mg/dL (ref 6–23)
CO2: 28 mEq/L (ref 19–32)
Calcium: 8.8 mg/dL (ref 8.4–10.5)
Chloride: 98 mEq/L (ref 96–112)
Creatinine, Ser: 0.73 mg/dL (ref 0.50–1.35)
GFR calc Af Amer: >90 mL/min (ref >90)
GFR calc non Af Amer: >90 mL/min (ref >90)
Glucose, Bld: 93 mg/dL (ref 70–99)
Potassium: 4.5 mEq/L (ref 3.7–5.3)
Sodium: 135 mEq/L — ABNORMAL LOW (ref 137–147)
Total Bilirubin: 0.4 mg/dL (ref 0.3–1.2)
Total Protein: 7.1 g/dL (ref 6.0–8.3)

## 2013-08-12 LAB — CBC WITH DIFFERENTIAL/PLATELET
Basophils Absolute: 0 10*3/uL (ref 0.0–0.1)
Basophils Relative: 1 % (ref 0–1)
Eosinophils Absolute: 0.4 10*3/uL (ref 0.0–0.7)
Eosinophils Relative: 7 % — ABNORMAL HIGH (ref 0–5)
HCT: 37.3 % — ABNORMAL LOW (ref 39.0–52.0)
Hemoglobin: 13.2 g/dL (ref 13.0–17.0)
Lymphocytes Relative: 38 % (ref 12–46)
Lymphs Abs: 2.3 10*3/uL (ref 0.7–4.0)
MCH: 32.5 pg (ref 26.0–34.0)
MCHC: 35.4 g/dL (ref 30.0–36.0)
MCV: 91.9 fL (ref 78.0–100.0)
Monocytes Absolute: 0.4 10*3/uL (ref 0.1–1.0)
Monocytes Relative: 7 % (ref 3–12)
Neutro Abs: 2.8 10*3/uL (ref 1.7–7.7)
Neutrophils Relative %: 47 % (ref 43–77)
Platelets: 212 10*3/uL (ref 150–400)
RBC: 4.06 MIL/uL — ABNORMAL LOW (ref 4.22–5.81)
RDW: 12.8 % (ref 11.5–15.5)
WBC: 5.9 10*3/uL (ref 4.0–10.5)

## 2013-08-12 LAB — URINALYSIS, ROUTINE W REFLEX MICROSCOPIC
Bilirubin Urine: NEGATIVE
Glucose, UA: NEGATIVE mg/dL
Hgb urine dipstick: NEGATIVE
Ketones, ur: NEGATIVE mg/dL
Leukocytes, UA: NEGATIVE
Nitrite: NEGATIVE
Protein, ur: NEGATIVE mg/dL
Specific Gravity, Urine: 1.02 (ref 1.005–1.030)
Urobilinogen, UA: 0.2 mg/dL (ref 0.0–1.0)
pH: 6.5 (ref 5.0–8.0)

## 2013-08-12 MED ORDER — HYDROXYZINE HCL 25 MG PO TABS: 25.0000 mg | ORAL_TABLET | Freq: Four times a day (QID) | ORAL | Status: DC | PRN

## 2013-08-12 MED ORDER — KETOROLAC TROMETHAMINE 30 MG/ML IJ SOLN
30.0000 mg | Freq: Once | INTRAMUSCULAR | Status: AC
  Administered 2013-08-12: 30 mg via INTRAVENOUS
  Filled 2013-08-12: qty 1

## 2013-08-12 MED ORDER — SODIUM CHLORIDE 0.9 % IV BOLUS (SEPSIS)
1000.0000 mL | Freq: Once | INTRAVENOUS | Status: AC
  Administered 2013-08-12: 1000 mL via INTRAVENOUS

## 2013-08-12 NOTE — ED Notes (Signed)
Patient placed on continuous cardiac monitoring, continuous pulse 0x monitoring, wife at the bedside, pt is alert and oriented.

## 2013-08-12 NOTE — Discharge Instructions (Signed)
As discussed, it is important that you follow up as soon as possible with your physicians for continued management of your condition. ° °If you develop any new, or concerning changes in your condition, please return to the emergency department immediately. ° ° ° °

## 2013-08-12 NOTE — ED Provider Notes (Signed)
CSN: [id number]     Arrival date & time 08/12/13  1749 History   This chart was scribed for Carmin Muskrat, MD by Era Bumpers, ED scribe. This patient was seen in room APA04/APA04 and the patient's care was started at 1749.  Chief Complaint  Patient presents with  . Loss of Consciousness   The history is provided by the patient. No language interpreter was used.   HPI Comments: Eric Tran is a 63 y.o. male who presents to the Emergency Department complaining of x1 LOC episode this PM while he was walking in a house today, unsure of duration to LOC but has been post ictal past hour and a half; back to normal now per family; he does not recall LOC.  Over the past year and a half, he has had w/u for similar episodes. He has had x3 similar episodes over past year and a half. Has been seen at the [facility] for this problem and was dx w/a mild form of epilepsy. He has been on a few different seizure medicines and is not currently on any due to the perceived side effects. He has also been taking benadryl for a red rash over his torso that spread to his legs, this began several days ago and has been taking benadryl daily (including possibly 200mg  today). He states the rash is improving.   Currently, he reports that he feels generally weak and does not recall events of his LOC. His family states that during this episode he became disoriented with his eyes staring off into space  then passed out and slumped to the floor. His family reports that he has also been having severe mood swings in which he is not acting like himself, becomes angry and hostile which is not normal for him.  He reports that he sometimes has a HA and confusion. He has been seen at the [facility] for this problem.   Hx of x4 CVA PCP Dr. Milinda Pointer last Saturday cream for vasculitis   Past Medical History  Diagnosis Date  . Hyperthyroidism   . Chronic back pain     chronic narcotic use  . Degenerative disc disease   . Carpal tunnel syndrome   .  Arthritis   . Hypertension   . Chronic hepatitis C 1980    never had any treatment  . CVA (cerebral infarction) 06/2010  . Anxiety   . Depression   . UTI (lower urinary tract infection)   . Stroke 2012    left hemiplegia  . Seizures     Epilepsy  . Heart murmur   . Head trauma    Past Surgical History  Procedure Laterality Date  . Hand surgery      right  . Neck surgery    . Right hand reconstruction     Family History  Problem Relation Age of Onset  . Colon cancer Neg Hx   . Colon polyps Neg Hx   . Cervical cancer Mother   . Lung cancer Sister   . Breast cancer Sister   . Heart failure Father    History  Substance Use Topics  . Smoking status: Current Every Day Smoker -- 1.00 packs/day for 45 years    Types: Cigarettes  . Smokeless tobacco: Never Used  . Alcohol Use: 1.2 oz/week    2 Cans of beer per week     Comment: 2 beers a month    Review of Systems  Constitutional:       Per  HPI, otherwise negative  HENT:       Per HPI, otherwise negative  Respiratory:       Per HPI, otherwise negative  Cardiovascular:       Per HPI, otherwise negative  Gastrointestinal: Negative for vomiting.  Endocrine:       Negative aside from HPI  Genitourinary:       Neg aside from HPI   Musculoskeletal:       Per HPI, otherwise negative  Skin: Positive for rash.  Neurological: Positive for syncope and headaches.  Psychiatric/Behavioral: Positive for behavioral problems and confusion.  All other systems reviewed and are negative.   Allergies  Morphine and related  Home Medications   Current Outpatient Rx  Name  Route  Sig  Dispense  Refill  . ALPRAZolam (XANAX) 0.5 MG tablet   Oral   Take 0.5 mg by mouth 3 (three) times daily as needed. For sleep and anxiety         . amLODipine (NORVASC) 10 MG tablet   Oral   Take 5 mg by mouth daily.         Marland Kitchen aspirin EC 81 MG tablet   Oral   Take 81 mg by mouth daily.         . DULoxetine (CYMBALTA) 60 MG capsule    Oral   Take 60 mg by mouth 2 (two) times daily.          . fish oil-omega-3 fatty acids 1000 MG capsule   Oral   Take 2 g by mouth daily.         Marland Kitchen gabapentin (NEURONTIN) 400 MG capsule   Oral   Take 400 mg by mouth 3 (three) times daily.         . Glucosamine-Chondroitin (GLUCOSAMINE CHONDR COMPLEX PO)   Oral   Take 2 tablets by mouth daily.         Marland Kitchen lisinopril (PRINIVIL,ZESTRIL) 20 MG tablet   Oral   Take 20 mg by mouth daily.         . methimazole (TAPAZOLE) 10 MG tablet   Oral   Take 10 mg by mouth daily.         . Multiple Vitamin (MULTIVITAMIN WITH MINERALS) TABS tablet   Oral   Take 1 tablet by mouth daily.         Marland Kitchen oxyCODONE (OXY IR/ROXICODONE) 5 MG immediate release tablet   Oral   Take 5 mg by mouth every 4 (four) hours as needed for severe pain. Pain         . propranolol (INDERAL) 80 MG tablet   Oral   Take 80 mg by mouth daily.         . Tamsulosin HCl (FLOMAX) 0.4 MG CAPS   Oral   Take 0.4 mg by mouth daily.          . traZODone (DESYREL) 50 MG tablet   Oral   Take 50 mg by mouth at bedtime.          Triage Vitals: BP 154/79  Pulse 64  Temp(Src) 97.5 F (36.4 C) (Oral)  Resp 18  Ht 5\' 8"  (1.727 m)  Wt 135 lb (61.236 kg)  BMI 20.53 kg/m2  SpO2 97%  Physical Exam  Nursing note and vitals reviewed. Constitutional: He is oriented to person, place, and time. He appears well-developed. No distress.  HENT:  Head: Normocephalic and atraumatic.  Mouth/Throat: Uvula is midline and mucous membranes are normal.  Eyes: Conjunctivae and EOM are normal.  Cardiovascular: Normal rate, regular rhythm and normal heart sounds.   No murmur heard. Pulmonary/Chest: Effort normal and breath sounds normal. No stridor. No respiratory distress. He has no wheezes. He has no rales.  Abdominal: He exhibits no distension.  Musculoskeletal: He exhibits no edema.  Neurological: He is alert and oriented to person, place, and time.  Skin:  Skin is warm and dry. Rash noted.  Both legs no rash, just scabbing lesions from previously healed rash.   Diffuse papular rash minimally rasied less than half mm throughout torso   Psychiatric: He has a normal mood and affect.   ED Course  Procedures (including critical care time) DIAGNOSTIC STUDIES: Oxygen Saturation is 97% on room air, normal by my interpretation.    COORDINATION OF CARE: At 630 PM Discussed treatment plan with patient which includes EKG, CXR, blood work, UA. Patient agrees.   At 854 PM: I discussed with the patient and family that both his blood work and imaging results appear normal tonight. Discussed that I will prescribe a medicine for the itching that he has been having. Also recommended f/u at the [facility] for the behavior changes that he has had over the past few months.   Labs Review Labs Reviewed  COMPREHENSIVE METABOLIC PANEL - Abnormal; Notable for the following:    Sodium 135 (*)    All other components within normal limits  CBC WITH DIFFERENTIAL - Abnormal; Notable for the following:    RBC 4.06 (*)    HCT 37.3 (*)    Eosinophils Relative 7 (*)    All other components within normal limits  URINALYSIS, ROUTINE W REFLEX MICROSCOPIC   Imaging Review Dg Chest 2 View  08/12/2013   CLINICAL DATA:  Confusion, syncopal episode, left-sided chest pain radiating to the arm  EXAM: CHEST  2 VIEW  COMPARISON:  DG CHEST 2 VIEW dated 03/31/2013; DG CHEST 1V PORT dated 01/24/2013; DG CHEST 1V PORT dated 07/26/2012; CT CHEST W/O CM dated 07/26/2012  FINDINGS: Grossly unchanged cardiac silhouette and mediastinal contours. The lungs appear hyperexpanded with flattening of bilateral diaphragms. No focal airspace opacities. No pleural effusion or pneumothorax. No evidence of edema. Unchanged bones including lower cervical ACDF, incompletely evaluated.  IMPRESSION: Hyperexpanded lungs without acute cardiopulmonary disease per   Electronically Signed   By: Simonne Come M.D.   On:  08/12/2013 19:17    EKG Interpretation    Date/Time:  Friday August 12 2013 18:28:16 EST Ventricular Rate:  60 PR Interval:  180 QRS Duration: 96 QT Interval:  432 QTC Calculation: 432 R Axis:   64 Text Interpretation:  Normal sinus rhythm Possible Left atrial enlargement Borderline ECG When compared with ECG of 31-Mar-2013 14:49, No significant change was found Sinus rhythm Left atrial enlargement No significant change since last tracing Abnormal ekg Confirmed by Gerhard Munch  MD [phone]) on 08/12/2013 9:06:19 PM           MDM   Final diagnoses:  Loss of consciousness  Rash   I personally performed the services described in this documentation, which was scribed in my presence. The recorded information has been reviewed and is accurate.   This patient with a history of epilepsy, now presents after an episode of loss of consciousness.  Notably, the patient has not been taking any antiepileptic medication, and indeed has been taking excessive doses of Benadryl, likely lowering his seizure threshold.  On my exam patient awake, alert, in no distress, moving all  extremities spontaneously, with no evidence of seizure-related trauma.  Patient's evaluation is largely reassuring.  I had a lengthy conversation with the patient and his wife but need for ongoing monitoring and management as an outpatient given these reassuring findings.  This discussed the need for medication compliance.  Patient was provided additional resources for neurology followup as an outpatient, was discharged in stable condition.    Carmin Muskrat, MD 08/12/13 2109

## 2013-08-12 NOTE — ED Notes (Signed)
Pt reports has had a rash on calves and was given cream.  Now rash has spread all over.  C/O itching and pain.  Today pt took benadryl for itching a total of 200mg  in the past 24 hours.  This afternoon pt was looking at a new house and passed out.  Wife says pt has been confused since passing out.  Reports episode happened around 5:15 this evening.  Pt presently alert and oriented to self and place.  Pt unsure of day of week but knows who the president is.  Pt c/o pain in left side of abd and chest, radiates into left arm.  Pt was admitted to [facility] and was discharged on Feb 2.

## 2013-08-12 NOTE — ED Notes (Signed)
Pt has generalized rash, itching, began on Wednesday, on lower leg bilaterally

## 2013-09-17 ENCOUNTER — Encounter (HOSPITAL_COMMUNITY): Payer: Self-pay | Admitting: Emergency Medicine

## 2013-09-17 ENCOUNTER — Emergency Department (HOSPITAL_COMMUNITY)
Admission: EM | Admit: 2013-09-17 | Discharge: 2013-09-17 | Disposition: A | Discharge status: Home / Self Care | Payer: Non-veteran care | Attending: Emergency Medicine | Admitting: Emergency Medicine

## 2013-09-17 DIAGNOSIS — Z79899 Other long term (current) drug therapy: Secondary | ICD-10-CM | POA: Insufficient documentation

## 2013-09-17 DIAGNOSIS — F3289 Other specified depressive episodes: Secondary | ICD-10-CM | POA: Insufficient documentation

## 2013-09-17 DIAGNOSIS — Z9889 Other specified postprocedural states: Secondary | ICD-10-CM | POA: Insufficient documentation

## 2013-09-17 DIAGNOSIS — Y9389 Activity, other specified: Secondary | ICD-10-CM | POA: Insufficient documentation

## 2013-09-17 DIAGNOSIS — F17200 Nicotine dependence, unspecified, uncomplicated: Secondary | ICD-10-CM | POA: Insufficient documentation

## 2013-09-17 DIAGNOSIS — G8929 Other chronic pain: Secondary | ICD-10-CM | POA: Insufficient documentation

## 2013-09-17 DIAGNOSIS — F411 Generalized anxiety disorder: Secondary | ICD-10-CM | POA: Insufficient documentation

## 2013-09-17 DIAGNOSIS — Z7982 Long term (current) use of aspirin: Secondary | ICD-10-CM | POA: Insufficient documentation

## 2013-09-17 DIAGNOSIS — M129 Arthropathy, unspecified: Secondary | ICD-10-CM | POA: Insufficient documentation

## 2013-09-17 DIAGNOSIS — G40909 Epilepsy, unspecified, not intractable, without status epilepticus: Secondary | ICD-10-CM | POA: Insufficient documentation

## 2013-09-17 DIAGNOSIS — W261XXA Contact with sword or dagger, initial encounter: Secondary | ICD-10-CM

## 2013-09-17 DIAGNOSIS — R011 Cardiac murmur, unspecified: Secondary | ICD-10-CM | POA: Insufficient documentation

## 2013-09-17 DIAGNOSIS — F329 Major depressive disorder, single episode, unspecified: Secondary | ICD-10-CM | POA: Insufficient documentation

## 2013-09-17 DIAGNOSIS — Y929 Unspecified place or not applicable: Secondary | ICD-10-CM | POA: Insufficient documentation

## 2013-09-17 DIAGNOSIS — Z8673 Personal history of transient ischemic attack (TIA), and cerebral infarction without residual deficits: Secondary | ICD-10-CM | POA: Insufficient documentation

## 2013-09-17 DIAGNOSIS — W260XXA Contact with knife, initial encounter: Secondary | ICD-10-CM | POA: Insufficient documentation

## 2013-09-17 DIAGNOSIS — Z8619 Personal history of other infectious and parasitic diseases: Secondary | ICD-10-CM | POA: Insufficient documentation

## 2013-09-17 DIAGNOSIS — S61412A Laceration without foreign body of left hand, initial encounter: Secondary | ICD-10-CM

## 2013-09-17 DIAGNOSIS — S61409A Unspecified open wound of unspecified hand, initial encounter: Secondary | ICD-10-CM | POA: Insufficient documentation

## 2013-09-17 DIAGNOSIS — I10 Essential (primary) hypertension: Secondary | ICD-10-CM | POA: Insufficient documentation

## 2013-09-17 DIAGNOSIS — Z87440 Personal history of urinary (tract) infections: Secondary | ICD-10-CM | POA: Insufficient documentation

## 2013-09-17 MED ORDER — LIDOCAINE-EPINEPHRINE (PF) 2 %-1:200000 IJ SOLN
INTRAMUSCULAR | Status: AC
  Filled 2013-09-17: qty 20

## 2013-09-17 MED ORDER — HYDROCODONE-ACETAMINOPHEN 5-325 MG PO TABS: 1.0000 | ORAL_TABLET | ORAL | Status: DC | PRN

## 2013-09-17 MED ORDER — LIDOCAINE-EPINEPHRINE (PF) 1 %-1:200000 IJ SOLN: 10.0000 mL | Freq: Once | INTRAMUSCULAR | Status: DC

## 2013-09-17 NOTE — Discharge Instructions (Signed)
Laceration Care, Adult °A laceration is a cut that goes through all layers of the skin. The cut goes into the tissue beneath the skin. °HOME CARE °For stitches (sutures) or staples: °· Keep the cut clean and dry. °· If you have a bandage (dressing), change it at least once a day. Change the bandage if it gets wet or dirty, or as told by your doctor. °· Wash the cut with soap and water 2 times a day. Rinse the cut with water. Pat it dry with a clean towel. °· Put a thin layer of medicated cream on the cut as told by your doctor. °· You may shower after the first 24 hours. Do not soak the cut in water until the stitches are removed. °· Only take medicines as told by your doctor. °· Have your stitches or staples removed as told by your doctor. °For skin adhesive strips: °· Keep the cut clean and dry. °· Do not get the strips wet. You may take a bath, but be careful to keep the cut dry. °· If the cut gets wet, pat it dry with a clean towel. °· The strips will fall off on their own. Do not remove the strips that are still stuck to the cut. °For wound glue: °· You may shower or take baths. Do not soak or scrub the cut. Do not swim. Avoid heavy sweating until the glue falls off on its own. After a shower or bath, pat the cut dry with a clean towel. °· Do not put medicine on your cut until the glue falls off. °· If you have a bandage, do not put tape over the glue. °· Avoid lots of sunlight or tanning lamps until the glue falls off. Put sunscreen on the cut for the first year to reduce your scar. °· The glue will fall off on its own. Do not pick at the glue. °You may need a tetanus shot if: °· You cannot remember when you had your last tetanus shot. °· You have never had a tetanus shot. °If you need a tetanus shot and you choose not to have one, you may get tetanus. Sickness from tetanus can be serious. °GET HELP RIGHT AWAY IF:  °· Your pain does not get better with medicine. °· Your arm, hand, leg, or foot loses feeling  (numbness) or changes color. °· Your cut is bleeding. °· Your joint feels weak, or you cannot use your joint. °· You have painful lumps on your body. °· Your cut is red, puffy (swollen), or painful. °· You have a red line on the skin near the cut. °· You have yellowish-white fluid (pus) coming from the cut. °· You have a fever. °· You have a bad smell coming from the cut or bandage. °· Your cut breaks open before or after stitches are removed. °· You notice something coming out of the cut, such as wood or glass. °· You cannot move a finger or toe. °MAKE SURE YOU:  °· Understand these instructions. °· Will watch your condition. °· Will get help right away if you are not doing well or get worse. °Document Released: 12/03/2007 Document Revised: 09/08/2011 Document Reviewed: 12/10/2010 °[employer]® Patient Information ©2014 [employer]. ° ° ° ° °

## 2013-09-17 NOTE — ED Notes (Signed)
nad noted prior to dc. Dc instructions reviewed and explained to pt. Voiced understanding. 1 script given. drsg was applied to left hand as well. Pt tolerated without difficulty.

## 2013-09-17 NOTE — ED Provider Notes (Signed)
CSN: [id number]     Arrival date & time 09/17/13  1113 History   First MD Initiated Contact with Patient 09/17/13 1230     Chief Complaint  Patient presents with  . Extremity Laceration     (Consider location/radiation/quality/duration/timing/severity/associated sxs/prior Treatment) HPI Comments: Eric Tran is a 63 y.o. Male presenting with laceration of his left hand thenar eminence which occurred when he was cutting a wire with a brand new sharp pocket knife.  The injury occurred just prior to arrival. He reports it bled copiously, but is now controlled with pressure.  He denies numbness in his fingers.  His last tetanus shot was 1 year ago.     The history is provided by the patient and the spouse.    Past Medical History  Diagnosis Date  . Hyperthyroidism   . Chronic back pain     chronic narcotic use  . Degenerative disc disease   . Carpal tunnel syndrome   . Arthritis   . Hypertension   . Chronic hepatitis C 1980    never had any treatment  . CVA (cerebral infarction) 06/2010  . Anxiety   . Depression   . UTI (lower urinary tract infection)   . Stroke 2012    left hemiplegia  . Seizures     Epilepsy  . Heart murmur   . Head trauma    Past Surgical History  Procedure Laterality Date  . Hand surgery      right  . Neck surgery    . Right hand reconstruction     Family History  Problem Relation Age of Onset  . Colon cancer Neg Hx   . Colon polyps Neg Hx   . Cervical cancer Mother   . Lung cancer Sister   . Breast cancer Sister   . Heart failure Father    History  Substance Use Topics  . Smoking status: Current Every Day Smoker -- 1.00 packs/day for 45 years    Types: Cigarettes  . Smokeless tobacco: Never Used  . Alcohol Use: No    Review of Systems  Constitutional: Negative for fever and chills.  HENT: Negative for facial swelling.   Respiratory: Negative for shortness of breath and wheezing.   Skin: Positive for wound.  Neurological: Negative  for numbness.      Allergies  Morphine and related  Home Medications   Current Outpatient Rx  Name  Route  Sig  Dispense  Refill  . ALPRAZolam (XANAX) 0.5 MG tablet   Oral   Take 0.5 mg by mouth 3 (three) times daily as needed. For sleep and anxiety         . amLODipine (NORVASC) 10 MG tablet   Oral   Take 5 mg by mouth daily.         Marland Kitchen aspirin EC 81 MG tablet   Oral   Take 81 mg by mouth daily.         . DULoxetine (CYMBALTA) 60 MG capsule   Oral   Take 60 mg by mouth 2 (two) times daily.          . fish oil-omega-3 fatty acids 1000 MG capsule   Oral   Take 2 g by mouth daily.         Marland Kitchen gabapentin (NEURONTIN) 400 MG capsule   Oral   Take 400 mg by mouth 3 (three) times daily.         . Glucosamine-Chondroitin (GLUCOSAMINE CHONDR COMPLEX PO)   Oral  Take 2 tablets by mouth daily.         Marland Kitchen HYDROcodone-acetaminophen (NORCO/VICODIN) 5-325 MG per tablet   Oral   Take 1 tablet by mouth every 4 (four) hours as needed.   20 tablet   0   . hydrOXYzine (ATARAX/VISTARIL) 25 MG tablet   Oral   Take 1 tablet (25 mg total) by mouth every 6 (six) hours as needed for itching.   15 tablet   0   . lisinopril (PRINIVIL,ZESTRIL) 20 MG tablet   Oral   Take 20 mg by mouth daily.         . methimazole (TAPAZOLE) 10 MG tablet   Oral   Take 10 mg by mouth daily.         . Multiple Vitamin (MULTIVITAMIN WITH MINERALS) TABS tablet   Oral   Take 1 tablet by mouth daily.         Marland Kitchen oxyCODONE (OXY IR/ROXICODONE) 5 MG immediate release tablet   Oral   Take 5 mg by mouth every 4 (four) hours as needed for severe pain. Pain         . propranolol (INDERAL) 80 MG tablet   Oral   Take 80 mg by mouth daily.         . Tamsulosin HCl (FLOMAX) 0.4 MG CAPS   Oral   Take 0.4 mg by mouth daily.          . traZODone (DESYREL) 50 MG tablet   Oral   Take 50 mg by mouth at bedtime.          BP 159/98  Pulse 105  Temp(Src) 98.5 F (36.9 C) (Oral)   Resp 20  Ht 5\' 8"  (1.727 m)  Wt 125 lb (56.7 kg)  BMI 19.01 kg/m2  SpO2 96% Physical Exam  Constitutional: He is oriented to person, place, and time. He appears well-developed and well-nourished.  HENT:  Head: Normocephalic.  Cardiovascular: Normal rate.   Pulmonary/Chest: Effort normal.  Musculoskeletal: He exhibits tenderness.  Neurological: He is alert and oriented to person, place, and time. No sensory deficit.  Skin: Laceration noted.  4 cm laceration through the left hand thenar eminence,  Subcutaneous,  Bleeding is controlled.  Superficial flap at the distal end of the wound.  Distal sensation intact.  He has FROM of his digits.  No deep structures visualized.    ED Course  Procedures (including critical care time)   LACERATION REPAIR Performed by: Evalee Jefferson Authorized by: Evalee Jefferson Consent: Verbal consent obtained. Risks and benefits: risks, benefits and alternatives were discussed Consent given by: patient Patient identity confirmed: provided demographic data Prepped and Draped in normal sterile fashion Wound explored  Laceration Location: left hand  Laceration Length: 4cm  No Foreign Bodies seen or palpated  Anesthesia: local infiltration  Local anesthetic: lidocaine 1% with epinephrine  Anesthetic total: 3 ml  Irrigation method: syringe Amount of cleaning: standard  Skin closure: ethilon 4-0   Number of sutures: 7,  Plus sterile strips at flap end  Technique: simple interupted  Patient tolerance: Patient tolerated the procedure well with no immediate complications.  Labs Review Labs Reviewed - No data to display Imaging Review No results found.   EKG Interpretation None      MDM   Final diagnoses:  Laceration of left hand    Laceration with adequate hemostasis, sutures placed.  Wound care instructions given.  Pt advised to have sutures removed in 10 days,  Return here sooner for  any signs of infection including redness, swelling,  worse pain or drainage of pus.       Evalee Jefferson, PA-C 09/19/13 1318

## 2013-09-17 NOTE — ED Notes (Addendum)
Pt was cutting a piece of wire with a knife when the knife went through cutting his left hand, pt has laceration noted to inside of left thumb area, bleeding upon arrival to triage, pressure bandage applied, last tetanus was a year ago, cms intact distal

## 2013-09-21 NOTE — ED Provider Notes (Signed)
Medical screening examination/treatment/procedure(s) were performed by non-physician practitioner and as supervising physician I was immediately available for consultation/collaboration.   EKG Interpretation None       Nat Christen, MD 09/21/13 1243

## 2013-10-04 ENCOUNTER — Emergency Department (HOSPITAL_COMMUNITY)
Admission: EM | Admit: 2013-10-04 | Discharge: 2013-10-04 | Discharge status: Against Medical Advice | Payer: Non-veteran care | Attending: Emergency Medicine | Admitting: Emergency Medicine

## 2013-10-04 ENCOUNTER — Encounter (HOSPITAL_COMMUNITY): Payer: Self-pay | Admitting: Emergency Medicine

## 2013-10-04 ENCOUNTER — Emergency Department (HOSPITAL_COMMUNITY): Payer: Self-pay

## 2013-10-04 DIAGNOSIS — Z79899 Other long term (current) drug therapy: Secondary | ICD-10-CM | POA: Insufficient documentation

## 2013-10-04 DIAGNOSIS — R5381 Other malaise: Secondary | ICD-10-CM | POA: Insufficient documentation

## 2013-10-04 DIAGNOSIS — Z8639 Personal history of other endocrine, nutritional and metabolic disease: Secondary | ICD-10-CM | POA: Insufficient documentation

## 2013-10-04 DIAGNOSIS — J3489 Other specified disorders of nose and nasal sinuses: Secondary | ICD-10-CM | POA: Insufficient documentation

## 2013-10-04 DIAGNOSIS — R0602 Shortness of breath: Secondary | ICD-10-CM | POA: Insufficient documentation

## 2013-10-04 DIAGNOSIS — R531 Weakness: Secondary | ICD-10-CM

## 2013-10-04 DIAGNOSIS — G8929 Other chronic pain: Secondary | ICD-10-CM | POA: Insufficient documentation

## 2013-10-04 DIAGNOSIS — R05 Cough: Secondary | ICD-10-CM | POA: Insufficient documentation

## 2013-10-04 DIAGNOSIS — R509 Fever, unspecified: Secondary | ICD-10-CM | POA: Insufficient documentation

## 2013-10-04 DIAGNOSIS — Z87440 Personal history of urinary (tract) infections: Secondary | ICD-10-CM | POA: Insufficient documentation

## 2013-10-04 DIAGNOSIS — K029 Dental caries, unspecified: Secondary | ICD-10-CM | POA: Insufficient documentation

## 2013-10-04 DIAGNOSIS — G40909 Epilepsy, unspecified, not intractable, without status epilepticus: Secondary | ICD-10-CM | POA: Insufficient documentation

## 2013-10-04 DIAGNOSIS — Z87828 Personal history of other (healed) physical injury and trauma: Secondary | ICD-10-CM | POA: Insufficient documentation

## 2013-10-04 DIAGNOSIS — F411 Generalized anxiety disorder: Secondary | ICD-10-CM | POA: Insufficient documentation

## 2013-10-04 DIAGNOSIS — R5383 Other fatigue: Principal | ICD-10-CM

## 2013-10-04 DIAGNOSIS — R634 Abnormal weight loss: Secondary | ICD-10-CM | POA: Insufficient documentation

## 2013-10-04 DIAGNOSIS — F0390 Unspecified dementia without behavioral disturbance: Secondary | ICD-10-CM | POA: Insufficient documentation

## 2013-10-04 DIAGNOSIS — M542 Cervicalgia: Secondary | ICD-10-CM | POA: Insufficient documentation

## 2013-10-04 DIAGNOSIS — Z8669 Personal history of other diseases of the nervous system and sense organs: Secondary | ICD-10-CM | POA: Insufficient documentation

## 2013-10-04 DIAGNOSIS — M2550 Pain in unspecified joint: Secondary | ICD-10-CM | POA: Insufficient documentation

## 2013-10-04 DIAGNOSIS — Z7982 Long term (current) use of aspirin: Secondary | ICD-10-CM | POA: Insufficient documentation

## 2013-10-04 DIAGNOSIS — R062 Wheezing: Secondary | ICD-10-CM | POA: Insufficient documentation

## 2013-10-04 DIAGNOSIS — R059 Cough, unspecified: Secondary | ICD-10-CM | POA: Insufficient documentation

## 2013-10-04 DIAGNOSIS — M129 Arthropathy, unspecified: Secondary | ICD-10-CM | POA: Insufficient documentation

## 2013-10-04 DIAGNOSIS — R599 Enlarged lymph nodes, unspecified: Secondary | ICD-10-CM | POA: Insufficient documentation

## 2013-10-04 DIAGNOSIS — F329 Major depressive disorder, single episode, unspecified: Secondary | ICD-10-CM | POA: Insufficient documentation

## 2013-10-04 DIAGNOSIS — R51 Headache: Secondary | ICD-10-CM | POA: Insufficient documentation

## 2013-10-04 DIAGNOSIS — F17200 Nicotine dependence, unspecified, uncomplicated: Secondary | ICD-10-CM | POA: Insufficient documentation

## 2013-10-04 DIAGNOSIS — R011 Cardiac murmur, unspecified: Secondary | ICD-10-CM | POA: Insufficient documentation

## 2013-10-04 DIAGNOSIS — I10 Essential (primary) hypertension: Secondary | ICD-10-CM | POA: Insufficient documentation

## 2013-10-04 DIAGNOSIS — IMO0001 Reserved for inherently not codable concepts without codable children: Secondary | ICD-10-CM | POA: Insufficient documentation

## 2013-10-04 DIAGNOSIS — F3289 Other specified depressive episodes: Secondary | ICD-10-CM | POA: Insufficient documentation

## 2013-10-04 DIAGNOSIS — Z8673 Personal history of transient ischemic attack (TIA), and cerebral infarction without residual deficits: Secondary | ICD-10-CM | POA: Insufficient documentation

## 2013-10-04 DIAGNOSIS — Z862 Personal history of diseases of the blood and blood-forming organs and certain disorders involving the immune mechanism: Secondary | ICD-10-CM | POA: Insufficient documentation

## 2013-10-04 DIAGNOSIS — Z8619 Personal history of other infectious and parasitic diseases: Secondary | ICD-10-CM | POA: Insufficient documentation

## 2013-10-04 LAB — CBC WITH DIFFERENTIAL/PLATELET
Basophils Absolute: 0 10*3/uL (ref 0.0–0.1)
Basophils Relative: 0 % (ref 0–1)
Eosinophils Absolute: 0 10*3/uL (ref 0.0–0.7)
Eosinophils Relative: 1 % (ref 0–5)
HCT: 41.7 % (ref 39.0–52.0)
Hemoglobin: 14.6 g/dL (ref 13.0–17.0)
Lymphocytes Relative: 26 % (ref 12–46)
Lymphs Abs: 2.2 10*3/uL (ref 0.7–4.0)
MCH: 32.4 pg (ref 26.0–34.0)
MCHC: 35 g/dL (ref 30.0–36.0)
MCV: 92.7 fL (ref 78.0–100.0)
Monocytes Absolute: 1.1 10*3/uL — ABNORMAL HIGH (ref 0.1–1.0)
Monocytes Relative: 13 % — ABNORMAL HIGH (ref 3–12)
Neutro Abs: 5.1 10*3/uL (ref 1.7–7.7)
Neutrophils Relative %: 60 % (ref 43–77)
Platelets: 217 10*3/uL (ref 150–400)
RBC: 4.5 MIL/uL (ref 4.22–5.81)
RDW: 12.6 % (ref 11.5–15.5)
WBC: 8.5 10*3/uL (ref 4.0–10.5)

## 2013-10-04 LAB — URINALYSIS, ROUTINE W REFLEX MICROSCOPIC
Bilirubin Urine: NEGATIVE
Glucose, UA: 100 mg/dL — AB
Hgb urine dipstick: NEGATIVE
Ketones, ur: NEGATIVE mg/dL
Leukocytes, UA: NEGATIVE
Nitrite: NEGATIVE
Protein, ur: NEGATIVE mg/dL
Specific Gravity, Urine: 1.015 (ref 1.005–1.030)
Urobilinogen, UA: 1 mg/dL (ref 0.0–1.0)
pH: 7 (ref 5.0–8.0)

## 2013-10-04 LAB — COMPREHENSIVE METABOLIC PANEL
ALT: 28 U/L (ref 0–53)
AST: 24 U/L (ref 0–37)
Albumin: 3.2 g/dL — ABNORMAL LOW (ref 3.5–5.2)
Alkaline Phosphatase: 71 U/L (ref 39–117)
BUN: 15 mg/dL (ref 6–23)
CO2: 27 mEq/L (ref 19–32)
Calcium: 8.8 mg/dL (ref 8.4–10.5)
Chloride: 99 mEq/L (ref 96–112)
Creatinine, Ser: 0.68 mg/dL (ref 0.50–1.35)
GFR calc Af Amer: >90 mL/min (ref >90)
GFR calc non Af Amer: >90 mL/min (ref >90)
Glucose, Bld: 87 mg/dL (ref 70–99)
Potassium: 4.3 mEq/L (ref 3.7–5.3)
Sodium: 139 mEq/L (ref 137–147)
Total Bilirubin: 0.3 mg/dL (ref 0.3–1.2)
Total Protein: 7.5 g/dL (ref 6.0–8.3)

## 2013-10-04 LAB — I-STAT CG4 LACTIC ACID, ED: Lactic Acid, Venous: 0.78 mmol/L (ref 0.5–2.2)

## 2013-10-04 MED ORDER — IBUPROFEN 400 MG PO TABS
600.0000 mg | ORAL_TABLET | Freq: Once | ORAL | Status: DC
  Filled 2013-10-04: qty 2

## 2013-10-04 MED ORDER — IBUPROFEN 400 MG PO TABS
ORAL_TABLET | ORAL | Status: AC
  Filled 2013-10-04: qty 1

## 2013-10-04 MED ORDER — SODIUM CHLORIDE 0.9 % IV SOLN
INTRAVENOUS | Status: DC
  Administered 2013-10-04: 10:00:00 via INTRAVENOUS

## 2013-10-04 MED ORDER — IPRATROPIUM-ALBUTEROL 0.5-2.5 (3) MG/3ML IN SOLN
3.0000 mL | Freq: Once | RESPIRATORY_TRACT | Status: DC
  Filled 2013-10-04: qty 3

## 2013-10-04 NOTE — ED Notes (Addendum)
Pt refused to have additional lab work performed, [facility] NP notified,

## 2013-10-04 NOTE — ED Notes (Signed)
Pt complaining of headache, advised pt that he had refused ibuprofen that was ordered, pt states "I have had that shit before and it does not work"

## 2013-10-04 NOTE — ED Notes (Signed)
Pt c/o generalized weakness, headache, joint aches for the past 4 days, is very agitated when RN entered room, states "too many people have been in here", explained to pt that different people have different roles, pt states "It doesn't matter, too many people are bothering me",

## 2013-10-04 NOTE — ED Notes (Signed)
Went into room, iv in floor, fluid running into floor, pt not in room or outside,

## 2013-10-04 NOTE — ED Notes (Signed)
Xray called to say, "Pt had refused to have xray done and cussed at staff that came to get him".  Pts nurse informed.

## 2013-10-04 NOTE — ED Provider Notes (Signed)
CSN: [id number]     Arrival date & time 10/04/13  [id number] History   First MD Initiated Contact with Patient 10/04/13 [phone]     Chief Complaint  Patient presents with  . Weakness     (Consider location/radiation/quality/duration/timing/severity/associated sxs/prior Treatment) Patient is a 63 y.o. male presenting with URI. The history is provided by the patient.  URI Presenting symptoms: cough, fatigue and fever   Presenting symptoms: no congestion, no ear pain, no facial pain, no rhinorrhea and no sore throat   Severity:  Moderate Onset quality:  Gradual Duration:  4 days Timing:  Constant Progression:  Worsening Chronicity:  New Relieved by:  Nothing Worsened by:  Nothing tried Associated symptoms: arthralgias, headaches, myalgias, neck pain, sinus pain, swollen glands and wheezing   Associated symptoms: no sneezing    DEMAREA MAHONY is a 63 y.o. male who presents to the ED with generalized weakness and body aches x 4 days.  He is slightly tearful during interview.  Relates intermittent cough, HA tenderness at L and posterior neck, denies congestion and photosensitivity.  Denies n/v/d or abdominal pain.  Patient states he was admitted at [facility] a month ago, but cannot recall why.  According to [facility], patient was admitted in Aug. 2014.  Spoke w/wife via phone.  She states patient has dementia and drove self to hospital.  She reports he was seen in [facility] ER 2 weeks ago for hand laceration.  Was hospitalized in [place] in January for suicide attempt.  Patient has had 2 head injuries in the past year, has had poor appetite and weight loss along with frequent infections of unknown etiology according to wife. Patient has multiple dental caries which spouse wonders if could be causing the infections.  She is also concerned his ammonia levels may be elevated.  He has an appointment at the [facility] tomorrow to see his PCP. Past Medical History  Diagnosis Date  . Hyperthyroidism   . Chronic back  pain     chronic narcotic use  . Degenerative disc disease   . Carpal tunnel syndrome   . Arthritis   . Hypertension   . Chronic hepatitis C 1980    never had any treatment  . CVA (cerebral infarction) 06/2010  . Anxiety   . Depression   . UTI (lower urinary tract infection)   . Stroke 2012    left hemiplegia  . Seizures     Epilepsy  . Heart murmur   . Head trauma    Past Surgical History  Procedure Laterality Date  . Hand surgery      right  . Neck surgery    . Right hand reconstruction     Family History  Problem Relation Age of Onset  . Colon cancer Neg Hx   . Colon polyps Neg Hx   . Cervical cancer Mother   . Lung cancer Sister   . Breast cancer Sister   . Heart failure Father    History  Substance Use Topics  . Smoking status: Current Every Day Smoker -- 1.00 packs/day for 45 years    Types: Cigarettes  . Smokeless tobacco: Never Used  . Alcohol Use: No    Review of Systems  Constitutional: Positive for fever, appetite change, fatigue and unexpected weight change.       Spouse states patient has experienced recent weight loss and decreased appetite.  HENT: Positive for dental problem. Negative for congestion, ear pain, mouth sores, rhinorrhea, sinus pressure, sneezing and sore throat.  Several dental caries  Respiratory: Positive for cough, shortness of breath and wheezing.   Cardiovascular: Negative for chest pain and leg swelling.  Gastrointestinal: Negative for abdominal distention.  Musculoskeletal: Positive for arthralgias, myalgias and neck pain.  Neurological: Positive for weakness and headaches.  Hematological: Positive for adenopathy.       Anterior cervical, mandibular and suprclavicular lymphadenopathy  Psychiatric/Behavioral: Positive for behavioral problems.       Spouse relates admission for suicide attempt in Jan. 2015 and that patient has dementia.      Allergies  Morphine and related  Home Medications   Current Outpatient  Rx  Name  Route  Sig  Dispense  Refill  . ALPRAZolam (XANAX) 0.5 MG tablet   Oral   Take 0.5 mg by mouth 3 (three) times daily as needed. For sleep and anxiety         . amLODipine (NORVASC) 10 MG tablet   Oral   Take 5 mg by mouth daily.         Marland Kitchen aspirin EC 81 MG tablet   Oral   Take 81 mg by mouth daily.         . DULoxetine (CYMBALTA) 60 MG capsule   Oral   Take 60 mg by mouth 2 (two) times daily.          . fish oil-omega-3 fatty acids 1000 MG capsule   Oral   Take 2 g by mouth daily.         Marland Kitchen gabapentin (NEURONTIN) 400 MG capsule   Oral   Take 400 mg by mouth 3 (three) times daily.         . Glucosamine-Chondroitin (GLUCOSAMINE CHONDR COMPLEX PO)   Oral   Take 2 tablets by mouth daily.         Marland Kitchen HYDROcodone-acetaminophen (NORCO/VICODIN) 5-325 MG per tablet   Oral   Take 1 tablet by mouth every 4 (four) hours as needed.   20 tablet   0   . hydrOXYzine (ATARAX/VISTARIL) 25 MG tablet   Oral   Take 1 tablet (25 mg total) by mouth every 6 (six) hours as needed for itching.   15 tablet   0   . lisinopril (PRINIVIL,ZESTRIL) 20 MG tablet   Oral   Take 20 mg by mouth daily.         . methimazole (TAPAZOLE) 10 MG tablet   Oral   Take 10 mg by mouth daily.         . Multiple Vitamin (MULTIVITAMIN WITH MINERALS) TABS tablet   Oral   Take 1 tablet by mouth daily.         Marland Kitchen oxyCODONE (OXY IR/ROXICODONE) 5 MG immediate release tablet   Oral   Take 5 mg by mouth every 4 (four) hours as needed for severe pain. Pain         . propranolol (INDERAL) 80 MG tablet   Oral   Take 80 mg by mouth daily.         . Tamsulosin HCl (FLOMAX) 0.4 MG CAPS   Oral   Take 0.4 mg by mouth daily.          . traZODone (DESYREL) 50 MG tablet   Oral   Take 50 mg by mouth at bedtime.          BP 156/90  Pulse 94  Temp(Src) 98.1 F (36.7 C) (Oral)  Resp 20  Ht 5\' 8"  (1.727 m)  Wt 125 lb (56.7 kg)  BMI 19.01 kg/m2  SpO2 96% Physical Exam   Constitutional: He is oriented to person, place, and time. He appears distressed.  HENT:  Head: Normocephalic.  Right Ear: External ear normal.  Left Ear: External ear normal.  Mouth/Throat: Oropharynx is clear and moist.  Multiple caries  Eyes: Conjunctivae are normal. Pupils are equal, round, and reactive to light.  Neck: No JVD present.  Cardiovascular: Normal rate and regular rhythm.  Exam reveals no gallop and no friction rub.   Murmur heard. Pulmonary/Chest: No stridor. He has wheezes. He has no rales.  Abdominal: Soft. He exhibits no distension. There is no tenderness.  Genitourinary:  deferred  Musculoskeletal: He exhibits no edema and no tenderness.  Lymphadenopathy:    He has cervical adenopathy.  Neurological: He is alert and oriented to person, place, and time.  Skin: Skin is warm and dry.  Scattered scabbed areas over back and bilateral LE from knee down  Psychiatric:  Tearful during interview   Results for orders placed during the hospital encounter of 10/04/13 (from the past 24 hour(s))  CBC WITH DIFFERENTIAL     Status: Abnormal   Collection Time    10/04/13  9:06 AM      Result Value Ref Range   WBC 8.5  4.0 - 10.5 K/uL   RBC 4.50  4.22 - 5.81 MIL/uL   Hemoglobin 14.6  13.0 - 17.0 g/dL   HCT 41.7  39.0 - 52.0 %   MCV 92.7  78.0 - 100.0 fL   MCH 32.4  26.0 - 34.0 pg   MCHC 35.0  30.0 - 36.0 g/dL   RDW 12.6  11.5 - 15.5 %   Platelets 217  150 - 400 K/uL   Neutrophils Relative % 60  43 - 77 %   Neutro Abs 5.1  1.7 - 7.7 K/uL   Lymphocytes Relative 26  12 - 46 %   Lymphs Abs 2.2  0.7 - 4.0 K/uL   Monocytes Relative 13 (*) 3 - 12 %   Monocytes Absolute 1.1 (*) 0.1 - 1.0 K/uL   Eosinophils Relative 1  0 - 5 %   Eosinophils Absolute 0.0  0.0 - 0.7 K/uL   Basophils Relative 0  0 - 1 %   Basophils Absolute 0.0  0.0 - 0.1 K/uL  COMPREHENSIVE METABOLIC PANEL     Status: Abnormal   Collection Time    10/04/13  9:06 AM      Result Value Ref Range   Sodium 139   137 - 147 mEq/L   Potassium 4.3  3.7 - 5.3 mEq/L   Chloride 99  96 - 112 mEq/L   CO2 27  19 - 32 mEq/L   Glucose, Bld 87  70 - 99 mg/dL   BUN 15  6 - 23 mg/dL   Creatinine, Ser 0.68  0.50 - 1.35 mg/dL   Calcium 8.8  8.4 - 10.5 mg/dL   Total Protein 7.5  6.0 - 8.3 g/dL   Albumin 3.2 (*) 3.5 - 5.2 g/dL   AST 24  0 - 37 U/L   ALT 28  0 - 53 U/L   Alkaline Phosphatase 71  39 - 117 U/L   Total Bilirubin 0.3  0.3 - 1.2 mg/dL   GFR calc non Af Amer >90  >90 mL/min   GFR calc Af Amer >90  >90 mL/min  URINALYSIS, ROUTINE W REFLEX MICROSCOPIC     Status: Abnormal   Collection Time    10/04/13  9:34 AM      Result Value Ref Range   Color, Urine AMBER (*) YELLOW   APPearance CLEAR  CLEAR   Specific Gravity, Urine 1.015  1.005 - 1.030   pH 7.0  5.0 - 8.0   Glucose, UA 100 (*) NEGATIVE mg/dL   Hgb urine dipstick NEGATIVE  NEGATIVE   Bilirubin Urine NEGATIVE  NEGATIVE   Ketones, ur NEGATIVE  NEGATIVE mg/dL   Protein, ur NEGATIVE  NEGATIVE mg/dL   Urobilinogen, UA 1.0  0.0 - 1.0 mg/dL   Nitrite NEGATIVE  NEGATIVE   Leukocytes, UA NEGATIVE  NEGATIVE  I-STAT CG4 LACTIC ACID, ED     Status: None   Collection Time    10/04/13  9:59 AM      Result Value Ref Range   Lactic Acid, Venous 0.78  0.5 - 2.2 mmol/L    ED Course  Procedures Patient refused CXR and Neb treatment. Refused IV.  MDM  When I went back in the room to re examine the patient and discuss lab findings he was not there. The RN did not know where he was. His hospital gown was on the bed. RN called the patient's wife to let her know he had left because he has early dementia.   Patient's wife called back and states that the patient called her and is in [place] and can't remember his way home. She states that his behavior today has been typical of his recent behavior. He has an appointment at the [facility] tomorrow. Last week at the [place] his weight was 132 and today it is 125. His wife states that if he will return today she will bring  him back but she is not sure what he will agree to.      [place], [place] 10/04/13 [phone]

## 2013-10-04 NOTE — ED Notes (Signed)
Attempted to contact pt at [phone], was able to speak with pt's wife who advised that pt had not gotten home yet,

## 2013-10-04 NOTE — ED Notes (Signed)
Generalized body aches, generalized weakness, cough, sinus congestion, and joint pain x 4 days.  Denies n/v/d, denies abd pain.

## 2013-10-04 NOTE — ED Notes (Signed)
Pt refused chest xray, states "I am getting this fluid and leaving"

## 2013-10-04 NOTE — ED Notes (Signed)
Pt refused breathing tx, pt refused ibuprofen, pt agitated, comfort measures provided,

## 2013-10-04 NOTE — ED Notes (Signed)
Attempted to contact pt at [phone] with no answer and mailbox states "is full" attempted to call several times,

## 2013-10-08 NOTE — ED Provider Notes (Signed)
Medical screening examination/treatment/procedure(s) were performed by non-physician practitioner and as supervising physician I was immediately available for consultation/collaboration.   EKG Interpretation None      Rolland Porter, MD, Abram Sander   [name], MD 10/08/13 [phone]

## 2014-01-04 ENCOUNTER — Encounter (HOSPITAL_COMMUNITY): Payer: Self-pay | Admitting: Emergency Medicine

## 2014-01-04 ENCOUNTER — Emergency Department (HOSPITAL_COMMUNITY)
Admission: EM | Admit: 2014-01-04 | Discharge: 2014-01-05 | Disposition: A | Discharge status: Home / Self Care | Payer: Non-veteran care | Attending: Emergency Medicine | Admitting: Emergency Medicine

## 2014-01-04 DIAGNOSIS — Z7982 Long term (current) use of aspirin: Secondary | ICD-10-CM | POA: Insufficient documentation

## 2014-01-04 DIAGNOSIS — Z79899 Other long term (current) drug therapy: Secondary | ICD-10-CM | POA: Insufficient documentation

## 2014-01-04 DIAGNOSIS — Y929 Unspecified place or not applicable: Secondary | ICD-10-CM | POA: Insufficient documentation

## 2014-01-04 DIAGNOSIS — Z8739 Personal history of other diseases of the musculoskeletal system and connective tissue: Secondary | ICD-10-CM | POA: Insufficient documentation

## 2014-01-04 DIAGNOSIS — Z8673 Personal history of transient ischemic attack (TIA), and cerebral infarction without residual deficits: Secondary | ICD-10-CM | POA: Insufficient documentation

## 2014-01-04 DIAGNOSIS — M545 Low back pain, unspecified: Secondary | ICD-10-CM

## 2014-01-04 DIAGNOSIS — E0590 Thyrotoxicosis, unspecified without thyrotoxic crisis or storm: Secondary | ICD-10-CM | POA: Insufficient documentation

## 2014-01-04 DIAGNOSIS — F17200 Nicotine dependence, unspecified, uncomplicated: Secondary | ICD-10-CM | POA: Insufficient documentation

## 2014-01-04 DIAGNOSIS — G8929 Other chronic pain: Secondary | ICD-10-CM | POA: Insufficient documentation

## 2014-01-04 DIAGNOSIS — Y939 Activity, unspecified: Secondary | ICD-10-CM | POA: Insufficient documentation

## 2014-01-04 DIAGNOSIS — Z791 Long term (current) use of non-steroidal anti-inflammatories (NSAID): Secondary | ICD-10-CM | POA: Insufficient documentation

## 2014-01-04 DIAGNOSIS — W11XXXA Fall on and from ladder, initial encounter: Secondary | ICD-10-CM | POA: Insufficient documentation

## 2014-01-04 DIAGNOSIS — I10 Essential (primary) hypertension: Secondary | ICD-10-CM | POA: Insufficient documentation

## 2014-01-04 DIAGNOSIS — F3289 Other specified depressive episodes: Secondary | ICD-10-CM | POA: Insufficient documentation

## 2014-01-04 DIAGNOSIS — F329 Major depressive disorder, single episode, unspecified: Secondary | ICD-10-CM | POA: Insufficient documentation

## 2014-01-04 DIAGNOSIS — Z8679 Personal history of other diseases of the circulatory system: Secondary | ICD-10-CM | POA: Insufficient documentation

## 2014-01-04 DIAGNOSIS — R011 Cardiac murmur, unspecified: Secondary | ICD-10-CM | POA: Insufficient documentation

## 2014-01-04 DIAGNOSIS — IMO0002 Reserved for concepts with insufficient information to code with codable children: Secondary | ICD-10-CM | POA: Insufficient documentation

## 2014-01-04 DIAGNOSIS — Z8659 Personal history of other mental and behavioral disorders: Secondary | ICD-10-CM | POA: Insufficient documentation

## 2014-01-04 DIAGNOSIS — F411 Generalized anxiety disorder: Secondary | ICD-10-CM | POA: Insufficient documentation

## 2014-01-04 NOTE — ED Provider Notes (Signed)
CSN: [id number]     Arrival date & time 01/04/14  2119 History  This chart was scribed for Tanna Furry, MD by Roxan Diesel, ED scribe.  This patient was seen in room APA03/APA03 and the patient's care was started at 11:53 PM.   Chief Complaint  Patient presents with  . Back Pain  . Fall    The history is provided by the patient and the spouse. No language interpreter was used.    HPI Comments: Eric Tran is a 63 y.o. male with h/o degenerative disc disease, chronic back pain who presents to the Emergency Department complaining of persistent, progressively-worsening right lower paralumbar back pain that began at least 3 weeks ago.  Pt states that he fell off of the top of a 6-foot ladder 3-4 weeks ago.  Pt has short-term memory difficulties subsequent to a head injury and has trouble recalling the fall.  A neighbor saw him fall and told spouse that he was standing on the second-to-the-top step when he fell onto the ground.  Pt was able to get up on his own.  He reports the pain occasionally radiates into his right abdomen.  It does not radiate into legs.  Pain is worsened by movement.  Pain is preventing him from sleeping or being active.  He denies weakness, numbness or tingling in legs.  Pt admits to prior h/o short-term "sprains and strains" in his back but denies significant h/o ongoing back pain.   Past Medical History  Diagnosis Date  . Hyperthyroidism   . Chronic back pain     chronic narcotic use  . Degenerative disc disease   . Carpal tunnel syndrome   . Arthritis   . Hypertension   . Chronic hepatitis C 1980    never had any treatment  . CVA (cerebral infarction) 06/2010  . Anxiety   . Depression   . UTI (lower urinary tract infection)   . Stroke 2012    left hemiplegia  . Seizures     Epilepsy  . Heart murmur   . Head trauma     Past Surgical History  Procedure Laterality Date  . Hand surgery      right  . Neck surgery    . Right hand reconstruction       Family History  Problem Relation Age of Onset  . Colon cancer Neg Hx   . Colon polyps Neg Hx   . Cervical cancer Mother   . Lung cancer Sister   . Breast cancer Sister   . Heart failure Father     History  Substance Use Topics  . Smoking status: Current Every Day Smoker -- 1.00 packs/day for 45 years    Types: Cigarettes  . Smokeless tobacco: Never Used  . Alcohol Use: 1.2 oz/week    2 Cans of beer per week     Review of Systems  Constitutional: Negative for fever, chills, diaphoresis, appetite change and fatigue.  HENT: Negative for mouth sores, sore throat and trouble swallowing.   Eyes: Negative for visual disturbance.  Respiratory: Negative for cough, chest tightness, shortness of breath and wheezing.   Cardiovascular: Negative for chest pain.  Gastrointestinal: Negative for nausea, vomiting, abdominal pain, diarrhea and abdominal distention.  Endocrine: Negative for polydipsia, polyphagia and polyuria.  Genitourinary: Negative for dysuria, frequency and hematuria.  Musculoskeletal: Positive for back pain. Negative for gait problem.  Skin: Negative for color change, pallor and rash.  Neurological: Negative for dizziness, syncope, weakness, light-headedness, numbness and  headaches.  Hematological: Does not bruise/bleed easily.  Psychiatric/Behavioral: Negative for behavioral problems and confusion.      Allergies  Morphine and related  Home Medications   Prior to Admission medications   Medication Sig Start Date End Date Taking? Authorizing Provider  ALPRAZolam Duanne Moron) 0.5 MG tablet Take 0.5 mg by mouth 3 (three) times daily as needed. For sleep and anxiety    Historical Provider, MD  amLODipine (NORVASC) 10 MG tablet Take 5 mg by mouth daily.    Historical Provider, MD  aspirin EC 81 MG tablet Take 81 mg by mouth daily.    Historical Provider, MD  DULoxetine (CYMBALTA) 60 MG capsule Take 60 mg by mouth 2 (two) times daily.     Historical Provider, MD   gabapentin (NEURONTIN) 400 MG capsule Take 400 mg by mouth 3 (three) times daily.    Historical Provider, MD  Glucosamine-Chondroitin (GLUCOSAMINE CHONDR COMPLEX PO) Take 2 tablets by mouth daily.    Historical Provider, MD  lisinopril (PRINIVIL,ZESTRIL) 20 MG tablet Take 20 mg by mouth daily.    Historical Provider, MD  methimazole (TAPAZOLE) 10 MG tablet Take 10 mg by mouth daily.    Historical Provider, MD  methocarbamol (ROBAXIN) 500 MG tablet Take 1 tablet (500 mg total) by mouth 3 (three) times daily between meals as needed. 01/05/14   Tanna Furry, MD  Multiple Vitamin (MULTIVITAMIN WITH MINERALS) TABS tablet Take 1 tablet by mouth daily.    Historical Provider, MD  naproxen (NAPROSYN) 500 MG tablet Take 1 tablet (500 mg total) by mouth 2 (two) times daily. 01/05/14   Tanna Furry, MD  oxyCODONE-acetaminophen (PERCOCET/ROXICET) 5-325 MG per tablet Take 2 tablets by mouth every 4 (four) hours as needed. 01/05/14   Tanna Furry, MD  propranolol (INDERAL) 80 MG tablet Take 80 mg by mouth daily.    Historical Provider, MD  Tamsulosin HCl (FLOMAX) 0.4 MG CAPS Take 0.4 mg by mouth daily.     Historical Provider, MD  traZODone (DESYREL) 50 MG tablet Take 50 mg by mouth at bedtime.    Historical Provider, MD   BP 163/92  Pulse 75  Temp(Src) 97.4 F (36.3 C) (Oral)  Resp 20  Ht 5\' 8"  (1.727 m)  Wt 130 lb (58.968 kg)  BMI 19.77 kg/m2  SpO2 97%  Physical Exam  Nursing note and vitals reviewed. Constitutional: He is oriented to person, place, and time. He appears well-developed and well-nourished. No distress.  HENT:  Head: Normocephalic.  Eyes: Conjunctivae are normal. Pupils are equal, round, and reactive to light. No scleral icterus.  Neck: Normal range of motion. Neck supple. No thyromegaly present.  Cardiovascular: Normal rate and regular rhythm.  Exam reveals no gallop and no friction rub.   No murmur heard. Pulmonary/Chest: Effort normal and breath sounds normal. No respiratory distress. He  has no wheezes. He has no rales.  Abdominal: Soft. Bowel sounds are normal. He exhibits no distension. There is no tenderness. There is no rebound.  Musculoskeletal: Normal range of motion. He exhibits tenderness.  Paraspinal muscle tenderness in right lumbar region.  No midline tenderness.  Neurological: He is alert and oriented to person, place, and time.  Normal strength and sensation in legs.  Normal DTRs.  Skin: Skin is warm and dry. No rash noted.  Psychiatric: He has a normal mood and affect. His behavior is normal.    ED Course  Procedures (including critical care time)  DIAGNOSTIC STUDIES: Oxygen Saturation is 97% on room air, normal by  my interpretation.    COORDINATION OF CARE: 11:59 PM-Discussed treatment plan which includes pain medications, muscle relaxants, and imaging with pt at bedside and pt agreed to plan.     Labs Review Labs Reviewed - No data to display  Imaging Review Dg Lumbar Spine Complete  01/05/2014   CLINICAL DATA:  Low back pain for 3 weeks after falling from a step ladder.  EXAM: LUMBAR SPINE - COMPLETE 4+ VIEW  COMPARISON:  CT abdomen and pelvis 07/27/2012  FINDINGS: Mild degenerative endplate hypertrophic changes in the lumbar spine. There is no evidence of lumbar spine fracture. Alignment is normal. Intervertebral disc spaces are maintained. Vascular calcifications.  IMPRESSION: Degenerative changes in the lumbar spine. No acute bony abnormalities appreciated.   Electronically Signed   By: Lucienne Capers M.D.   On: 01/05/2014 00:51     EKG Interpretation None      MDM   Final diagnoses:  Right-sided low back pain without sciatica    I personally performed the services described in this documentation, which was scribed in my presence. The recorded information has been reviewed and is accurate.     X-rays show DJD.  No acute findings.  Plan is treatment for contusion, spasm.  PCP f/u prn.   Tanna Furry, MD 01/05/14 [phone]

## 2014-01-04 NOTE — ED Notes (Signed)
Pt arrives in wheelchair, pt on the right lower side x 3 weeks, worse tonight, [place] off a ladder 3 weeks ago. Pt has not been seen or treated for this fall.

## 2014-01-05 ENCOUNTER — Emergency Department (HOSPITAL_COMMUNITY): Payer: Non-veteran care

## 2014-01-05 MED ORDER — NAPROXEN 500 MG PO TABS: 500.0000 mg | ORAL_TABLET | Freq: Two times a day (BID) | ORAL | Status: DC

## 2014-01-05 MED ORDER — OXYCODONE-ACETAMINOPHEN 5-325 MG PO TABS: 2.0000 | ORAL_TABLET | ORAL | Status: DC | PRN

## 2014-01-05 MED ORDER — IBUPROFEN 800 MG PO TABS
800.0000 mg | ORAL_TABLET | Freq: Once | ORAL | Status: AC
  Administered 2014-01-05: 800 mg via ORAL
  Filled 2014-01-05: qty 1

## 2014-01-05 MED ORDER — CYCLOBENZAPRINE HCL 10 MG PO TABS
10.0000 mg | ORAL_TABLET | Freq: Once | ORAL | Status: AC
  Administered 2014-01-05: 10 mg via ORAL
  Filled 2014-01-05: qty 1

## 2014-01-05 MED ORDER — OXYCODONE-ACETAMINOPHEN 5-325 MG PO TABS
2.0000 | ORAL_TABLET | Freq: Once | ORAL | Status: AC
  Administered 2014-01-05: 2 via ORAL
  Filled 2014-01-05: qty 2

## 2014-01-05 MED ORDER — METHOCARBAMOL 500 MG PO TABS: 500.0000 mg | ORAL_TABLET | Freq: Three times a day (TID) | ORAL | Status: DC | PRN

## 2014-01-05 NOTE — Discharge Instructions (Signed)

## 2014-01-05 NOTE — ED Notes (Signed)
Family at bedside. MD at bedside at this time.

## 2014-01-12 ENCOUNTER — Emergency Department (HOSPITAL_COMMUNITY): Payer: Non-veteran care

## 2014-01-12 ENCOUNTER — Emergency Department (HOSPITAL_COMMUNITY)
Admission: EM | Admit: 2014-01-12 | Discharge: 2014-01-12 | Disposition: A | Discharge status: Home / Self Care | Payer: Non-veteran care | Attending: Emergency Medicine | Admitting: Emergency Medicine

## 2014-01-12 ENCOUNTER — Encounter (HOSPITAL_COMMUNITY): Payer: Self-pay | Admitting: Emergency Medicine

## 2014-01-12 DIAGNOSIS — E039 Hypothyroidism, unspecified: Secondary | ICD-10-CM | POA: Insufficient documentation

## 2014-01-12 DIAGNOSIS — R011 Cardiac murmur, unspecified: Secondary | ICD-10-CM | POA: Insufficient documentation

## 2014-01-12 DIAGNOSIS — R7402 Elevation of levels of lactic acid dehydrogenase (LDH): Secondary | ICD-10-CM | POA: Insufficient documentation

## 2014-01-12 DIAGNOSIS — R5383 Other fatigue: Secondary | ICD-10-CM

## 2014-01-12 DIAGNOSIS — M129 Arthropathy, unspecified: Secondary | ICD-10-CM | POA: Insufficient documentation

## 2014-01-12 DIAGNOSIS — R5381 Other malaise: Secondary | ICD-10-CM | POA: Insufficient documentation

## 2014-01-12 DIAGNOSIS — F411 Generalized anxiety disorder: Secondary | ICD-10-CM | POA: Insufficient documentation

## 2014-01-12 DIAGNOSIS — Z7982 Long term (current) use of aspirin: Secondary | ICD-10-CM | POA: Insufficient documentation

## 2014-01-12 DIAGNOSIS — I714 Abdominal aortic aneurysm, without rupture, unspecified: Secondary | ICD-10-CM | POA: Insufficient documentation

## 2014-01-12 DIAGNOSIS — I10 Essential (primary) hypertension: Secondary | ICD-10-CM | POA: Insufficient documentation

## 2014-01-12 DIAGNOSIS — M5441 Lumbago with sciatica, right side: Secondary | ICD-10-CM

## 2014-01-12 DIAGNOSIS — Z791 Long term (current) use of non-steroidal anti-inflammatories (NSAID): Secondary | ICD-10-CM | POA: Insufficient documentation

## 2014-01-12 DIAGNOSIS — R7401 Elevation of levels of liver transaminase levels: Secondary | ICD-10-CM | POA: Insufficient documentation

## 2014-01-12 DIAGNOSIS — G40909 Epilepsy, unspecified, not intractable, without status epilepticus: Secondary | ICD-10-CM | POA: Insufficient documentation

## 2014-01-12 DIAGNOSIS — R740 Nonspecific elevation of levels of transaminase and lactic acid dehydrogenase [LDH]: Secondary | ICD-10-CM

## 2014-01-12 DIAGNOSIS — F17200 Nicotine dependence, unspecified, uncomplicated: Secondary | ICD-10-CM | POA: Insufficient documentation

## 2014-01-12 DIAGNOSIS — F3289 Other specified depressive episodes: Secondary | ICD-10-CM | POA: Insufficient documentation

## 2014-01-12 DIAGNOSIS — Z79899 Other long term (current) drug therapy: Secondary | ICD-10-CM | POA: Insufficient documentation

## 2014-01-12 DIAGNOSIS — Z8619 Personal history of other infectious and parasitic diseases: Secondary | ICD-10-CM | POA: Insufficient documentation

## 2014-01-12 DIAGNOSIS — F329 Major depressive disorder, single episode, unspecified: Secondary | ICD-10-CM | POA: Insufficient documentation

## 2014-01-12 DIAGNOSIS — M5430 Sciatica, unspecified side: Secondary | ICD-10-CM | POA: Insufficient documentation

## 2014-01-12 DIAGNOSIS — Z87440 Personal history of urinary (tract) infections: Secondary | ICD-10-CM | POA: Insufficient documentation

## 2014-01-12 DIAGNOSIS — G8929 Other chronic pain: Secondary | ICD-10-CM | POA: Insufficient documentation

## 2014-01-12 DIAGNOSIS — IMO0002 Reserved for concepts with insufficient information to code with codable children: Secondary | ICD-10-CM | POA: Insufficient documentation

## 2014-01-12 DIAGNOSIS — Z8673 Personal history of transient ischemic attack (TIA), and cerebral infarction without residual deficits: Secondary | ICD-10-CM | POA: Insufficient documentation

## 2014-01-12 DIAGNOSIS — Z87828 Personal history of other (healed) physical injury and trauma: Secondary | ICD-10-CM | POA: Insufficient documentation

## 2014-01-12 LAB — COMPREHENSIVE METABOLIC PANEL
ALT: 65 U/L — ABNORMAL HIGH (ref 0–53)
AST: 42 U/L — ABNORMAL HIGH (ref 0–37)
Albumin: 3.7 g/dL (ref 3.5–5.2)
Alkaline Phosphatase: 65 U/L (ref 39–117)
Anion gap: 9 (ref 5–15)
BUN: 15 mg/dL (ref 6–23)
CO2: 29 mEq/L (ref 19–32)
Calcium: 9.3 mg/dL (ref 8.4–10.5)
Chloride: 102 mEq/L (ref 96–112)
Creatinine, Ser: 0.64 mg/dL (ref 0.50–1.35)
GFR calc Af Amer: >90 mL/min (ref >90)
GFR calc non Af Amer: >90 mL/min (ref >90)
Glucose, Bld: 127 mg/dL — ABNORMAL HIGH (ref 70–99)
Potassium: 4.2 mEq/L (ref 3.7–5.3)
Sodium: 140 mEq/L (ref 137–147)
Total Bilirubin: 0.3 mg/dL (ref 0.3–1.2)
Total Protein: 7.3 g/dL (ref 6.0–8.3)

## 2014-01-12 LAB — CBC WITH DIFFERENTIAL/PLATELET
Basophils Absolute: 0 10*3/uL (ref 0.0–0.1)
Basophils Relative: 1 % (ref 0–1)
Eosinophils Absolute: 0.1 10*3/uL (ref 0.0–0.7)
Eosinophils Relative: 2 % (ref 0–5)
HCT: 44.5 % (ref 39.0–52.0)
Hemoglobin: 15.5 g/dL (ref 13.0–17.0)
Lymphocytes Relative: 42 % (ref 12–46)
Lymphs Abs: 2.3 10*3/uL (ref 0.7–4.0)
MCH: 31.6 pg (ref 26.0–34.0)
MCHC: 34.8 g/dL (ref 30.0–36.0)
MCV: 90.8 fL (ref 78.0–100.0)
Monocytes Absolute: 0.7 10*3/uL (ref 0.1–1.0)
Monocytes Relative: 13 % — ABNORMAL HIGH (ref 3–12)
Neutro Abs: 2.3 10*3/uL (ref 1.7–7.7)
Neutrophils Relative %: 42 % — ABNORMAL LOW (ref 43–77)
Platelets: 183 10*3/uL (ref 150–400)
RBC: 4.9 MIL/uL (ref 4.22–5.81)
RDW: 12.5 % (ref 11.5–15.5)
WBC: 5.4 10*3/uL (ref 4.0–10.5)

## 2014-01-12 LAB — URINALYSIS, ROUTINE W REFLEX MICROSCOPIC
Bilirubin Urine: NEGATIVE
Glucose, UA: NEGATIVE mg/dL
Hgb urine dipstick: NEGATIVE
Ketones, ur: NEGATIVE mg/dL
Leukocytes, UA: NEGATIVE
Nitrite: NEGATIVE
Protein, ur: NEGATIVE mg/dL
Specific Gravity, Urine: 1.015 (ref 1.005–1.030)
Urobilinogen, UA: 0.2 mg/dL (ref 0.0–1.0)
pH: 6 (ref 5.0–8.0)

## 2014-01-12 MED ORDER — IOHEXOL 300 MG/ML  SOLN
100.0000 mL | Freq: Once | INTRAMUSCULAR | Status: AC | PRN
  Administered 2014-01-12: 100 mL via INTRAVENOUS

## 2014-01-12 MED ORDER — IOHEXOL 300 MG/ML  SOLN
50.0000 mL | Freq: Once | INTRAMUSCULAR | Status: AC | PRN
Start: 2014-01-12 — End: 2014-01-12
  Administered 2014-01-12: 50 mL via ORAL

## 2014-01-12 MED ORDER — HYDROMORPHONE HCL PF 1 MG/ML IJ SOLN
1.0000 mg | Freq: Once | INTRAMUSCULAR | Status: AC
  Administered 2014-01-12: 1 mg via INTRAVENOUS
  Filled 2014-01-12: qty 1

## 2014-01-12 MED ORDER — ONDANSETRON HCL 4 MG/2ML IJ SOLN
4.0000 mg | Freq: Once | INTRAMUSCULAR | Status: AC
  Administered 2014-01-12: 4 mg via INTRAVENOUS
  Filled 2014-01-12: qty 2

## 2014-01-12 MED ORDER — HYDROMORPHONE HCL 4 MG PO TABS: 4.0000 mg | ORAL_TABLET | Freq: Four times a day (QID) | ORAL | Status: DC | PRN

## 2014-01-12 MED ORDER — PREDNISONE 20 MG PO TABS: ORAL_TABLET | ORAL | Status: DC

## 2014-01-12 NOTE — Discharge Instructions (Signed)
SEEK IMMEDIATE MEDICAL ATTENTION IF: New numbness, tingling, weakness, or problem with the use of your arms or legs.  Severe back pain not relieved with medications.  Change in bowel or bladder control.  Increasing pain in any areas of the body (such as chest or abdominal pain).  Shortness of breath, dizziness or fainting.  Nausea (feeling sick to your stomach), vomiting, fever, or sweats.  Abdominal Aortic Aneurysm  Blood pumps away from the heart through tubes (blood vessels) called arteries. Aneurysms are weak or damaged places in the wall of an artery. It bulges out like a balloon. An abdominal aortic aneurysm happens in the main artery of the body (aorta). It can burst or tear, causing bleeding inside the body. This is an emergency. It needs treatment right away. CAUSES  The exact cause is unknown. Things that could cause this problem include:  Fat and other substances building up in the lining of a tube.  Swelling of the walls of a blood vessel.  Certain tissue diseases.  Belly (abdominal) trauma.  An infection in the main artery of the body. RISK FACTORS There are things that make it more likely for you to have an aneurysm. These include:  Being over the age of 63 years old.  Having high blood pressure (hypertension).  Being a male.  Being white.  Being very overweight (obese).  Having a family history of aneurysm.  Using tobacco products. PREVENTION To lessen your chance of getting this condition:  Stop smoking. Stop chewing tobacco.  Limit or avoid alcohol.  Keep your blood pressure, blood sugar, and cholesterol within normal limits.  Eat less salt.  Eat foods low in saturated fats and cholesterol. These are found in animal and whole dairy products.  Eat more fiber. Fiber is found in whole grains, vegetables, and fruits.  Keep a healthy weight.  Stay active and exercise often. SYMPTOMS Symptoms depend on the size of the aneurysm and how fast it  grows. There may not be symptoms. If symptoms occur, they can include:  Pain (belly, side, lower back, or groin).  Feeling full after eating a small amount of food.  Feeling sick to your stomach (nauseous), throwing up (vomiting), or both.  Feeling a lump in your belly that feels like it is beating (pulsating).  Feeling like you will pass out (faint). TREATMENT   Medicine to control blood pressure and pain.  Imaging tests to see if the aneurysm gets bigger.  Surgery. MAKE SURE YOU:   Understand these instructions.  Will watch your condition.  Will get help right away if you are not doing well or get worse. Document Released: 10/11/2012 Document Reviewed: 10/11/2012 [facility] Patient Information 2015 [place]. This information is not intended to replace advice given to you by your health care provider. Make sure you discuss any questions you have with your health care provider.

## 2014-01-12 NOTE — ED Provider Notes (Signed)
CSN: [id number]     Arrival date & time 01/12/14  0209 History   First MD Initiated Contact with Patient 01/12/14 0411     Chief Complaint  Patient presents with  . Back Pain  . Weakness     (Consider location/radiation/quality/duration/timing/severity/associated sxs/prior Treatment) Patient is a 63 y.o. male presenting with back pain and weakness. The history is provided by the patient.  Back Pain Associated symptoms: weakness   Weakness  He has been having pain in the right paralumbar area the last 6 weeks. Pain is now radiating to the right upper abdomen and flank area. Pain is severe and he rates it at 10/10. He has difficulty describing the quality of pain but eventually comes around to stating that it is sharp. Nothing makes it better and nothing makes it worse. He is unable to sleep because of the pain. He is noted generalized weakness over the last several days but no focal weakness. His wife is with him and are frequently redirects his answers. Apparently there has been about a 12 pound weight loss over the 6 weeks. He has not had any fever or chills. He has not broken out in sweats. He had been treated with NSAIDs with no relief. He was then given methocarbamol and oxycodone-acetaminophen and naproxen which gave him some improvement for a couple days but now are not giving him any relief. He has been seen at his [place] [facility] in [place] where he has had a workup which included a CT scan of the lumbar spine but no cause for his pain has been found. He is a cigarette smoker admitting to at least one pack of cigarettes a day. He denies bowel or bladder incontinence.  Past Medical History  Diagnosis Date  . Hyperthyroidism   . Chronic back pain     chronic narcotic use  . Degenerative disc disease   . Carpal tunnel syndrome   . Arthritis   . Hypertension   . Chronic hepatitis C 1980    never had any treatment  . CVA (cerebral infarction) 06/2010  . Anxiety   . Depression   .  UTI (lower urinary tract infection)   . Stroke 2012    left hemiplegia  . Seizures     Epilepsy  . Heart murmur   . Head trauma    Past Surgical History  Procedure Laterality Date  . Hand surgery      right  . Neck surgery    . Right hand reconstruction     Family History  Problem Relation Age of Onset  . Colon cancer Neg Hx   . Colon polyps Neg Hx   . Cervical cancer Mother   . Lung cancer Sister   . Breast cancer Sister   . Heart failure Father    History  Substance Use Topics  . Smoking status: Current Every Day Smoker -- 1.00 packs/day for 45 years    Types: Cigarettes  . Smokeless tobacco: Never Used  . Alcohol Use: 1.2 oz/week    2 Cans of beer per week    Review of Systems  Musculoskeletal: Positive for back pain.  Neurological: Positive for weakness.  All other systems reviewed and are negative.     Allergies  Morphine and related  Home Medications   Prior to Admission medications   Medication Sig Start Date End Date Taking? Authorizing Provider  ALPRAZolam Duanne Moron) 0.5 MG tablet Take 0.5 mg by mouth 3 (three) times daily as needed. For sleep and  anxiety   Yes Historical Provider, MD  amLODipine (NORVASC) 10 MG tablet Take 5 mg by mouth daily.   Yes Historical Provider, MD  aspirin EC 81 MG tablet Take 81 mg by mouth daily.   Yes Historical Provider, MD  DULoxetine (CYMBALTA) 60 MG capsule Take 60 mg by mouth 2 (two) times daily.    Yes Historical Provider, MD  gabapentin (NEURONTIN) 400 MG capsule Take 400 mg by mouth 3 (three) times daily.   Yes Historical Provider, MD  Glucosamine-Chondroitin (GLUCOSAMINE CHONDR COMPLEX PO) Take 2 tablets by mouth daily.   Yes Historical Provider, MD  lisinopril (PRINIVIL,ZESTRIL) 20 MG tablet Take 20 mg by mouth daily.   Yes Historical Provider, MD  methimazole (TAPAZOLE) 10 MG tablet Take 10 mg by mouth daily.   Yes Historical Provider, MD  methocarbamol (ROBAXIN) 500 MG tablet Take 1 tablet (500 mg total) by mouth  3 (three) times daily between meals as needed. 01/05/14  Yes Tanna Furry, MD  Multiple Vitamin (MULTIVITAMIN WITH MINERALS) TABS tablet Take 1 tablet by mouth daily.   Yes Historical Provider, MD  naproxen (NAPROSYN) 500 MG tablet Take 1 tablet (500 mg total) by mouth 2 (two) times daily. 01/05/14  Yes Tanna Furry, MD  oxyCODONE-acetaminophen (PERCOCET/ROXICET) 5-325 MG per tablet Take 2 tablets by mouth every 4 (four) hours as needed. 01/05/14  Yes Tanna Furry, MD  propranolol (INDERAL) 80 MG tablet Take 80 mg by mouth daily.   Yes Historical Provider, MD  Tamsulosin HCl (FLOMAX) 0.4 MG CAPS Take 0.4 mg by mouth daily.    Yes Historical Provider, MD  traZODone (DESYREL) 50 MG tablet Take 50 mg by mouth at bedtime.   Yes Historical Provider, MD   BP 193/105  Pulse 71  Temp(Src) 98.3 F (36.8 C) (Oral)  Resp 16  Ht 5\' 8"  (1.727 m)  Wt 130 lb (58.968 kg)  BMI 19.77 kg/m2  SpO2 99% Physical Exam  Nursing note and vitals reviewed.  Somewhat cachectic 63 year old male, resting comfortably and in no acute distress. Vital signs are significant for hypertension with blood pressure 193/105. Oxygen saturation is 99%, which is normal. Head is normocephalic and atraumatic. PERRLA, EOMI. Oropharynx is clear. Neck is nontender and supple without adenopathy or JVD. Back has no midline tenderness. There is very mild right paralumbar tenderness without any palpable spasm. There is no CVA tenderness. Lungs are clear without rales, wheezes, or rhonchi. Chest is nontender. Heart has regular rate and rhythm without murmur. Abdomen is soft, flat, with questionable right upper quadrant tenderness to deep palpation. There are no masses or hepatosplenomegaly and peristalsis is normoactive. Extremities have no cyanosis or edema, full range of motion is present. Skin is warm and dry without rash. Neurologic: Mental status is normal, cranial nerves are intact, there are no motor or sensory deficits. Specifically, he has  normal strength of all muscle groups in both legs and in both arms and no sensory deficits.  ED Course  Procedures (including critical care time) Labs Review Results for orders placed during the hospital encounter of 01/12/14  CBC WITH DIFFERENTIAL      Result Value Ref Range   WBC 5.4  4.0 - 10.5 K/uL   RBC 4.90  4.22 - 5.81 MIL/uL   Hemoglobin 15.5  13.0 - 17.0 g/dL   HCT 44.5  39.0 - 52.0 %   MCV 90.8  78.0 - 100.0 fL   MCH 31.6  26.0 - 34.0 pg   MCHC 34.8  30.0 - 36.0 g/dL   RDW 12.5  11.5 - 15.5 %   Platelets 183  150 - 400 K/uL   Neutrophils Relative % 42 (*) 43 - 77 %   Neutro Abs 2.3  1.7 - 7.7 K/uL   Lymphocytes Relative 42  12 - 46 %   Lymphs Abs 2.3  0.7 - 4.0 K/uL   Monocytes Relative 13 (*) 3 - 12 %   Monocytes Absolute 0.7  0.1 - 1.0 K/uL   Eosinophils Relative 2  0 - 5 %   Eosinophils Absolute 0.1  0.0 - 0.7 K/uL   Basophils Relative 1  0 - 1 %   Basophils Absolute 0.0  0.0 - 0.1 K/uL  COMPREHENSIVE METABOLIC PANEL      Result Value Ref Range   Sodium 140  137 - 147 mEq/L   Potassium 4.2  3.7 - 5.3 mEq/L   Chloride 102  96 - 112 mEq/L   CO2 29  19 - 32 mEq/L   Glucose, Bld 127 (*) 70 - 99 mg/dL   BUN 15  6 - 23 mg/dL   Creatinine, Ser 0.64  0.50 - 1.35 mg/dL   Calcium 9.3  8.4 - 10.5 mg/dL   Total Protein 7.3  6.0 - 8.3 g/dL   Albumin 3.7  3.5 - 5.2 g/dL   AST 42 (*) 0 - 37 U/L   ALT 65 (*) 0 - 53 U/L   Alkaline Phosphatase 65  39 - 117 U/L   Total Bilirubin 0.3  0.3 - 1.2 mg/dL   GFR calc non Af Amer >90  >90 mL/min   GFR calc Af Amer >90  >90 mL/min   Anion gap 9  5 - 15  URINALYSIS, ROUTINE W REFLEX MICROSCOPIC      Result Value Ref Range   Color, Urine YELLOW  YELLOW   APPearance CLEAR  CLEAR   Specific Gravity, Urine 1.015  1.005 - 1.030   pH 6.0  5.0 - 8.0   Glucose, UA NEGATIVE  NEGATIVE mg/dL   Hgb urine dipstick NEGATIVE  NEGATIVE   Bilirubin Urine NEGATIVE  NEGATIVE   Ketones, ur NEGATIVE  NEGATIVE mg/dL   Protein, ur NEGATIVE  NEGATIVE  mg/dL   Urobilinogen, UA 0.2  0.0 - 1.0 mg/dL   Nitrite NEGATIVE  NEGATIVE   Leukocytes, UA NEGATIVE  NEGATIVE   Imaging Review Dg Chest 2 View  01/12/2014   CLINICAL DATA:  Increasing weakness and back pain.  Smoker.  EXAM: CHEST  2 VIEW  COMPARISON:  Chest radiograph December 08, 2013  FINDINGS: Cardiac silhouette remains unremarkable. Mildly calcified aortic knob, mediastinal silhouette is nonsuspicious. No pleural effusions or focal consolidations. Increased lung volumes with flattened hemidiaphragms. No pneumothorax.  ACDF. Remote left posterior rib fractures. Mild degenerative change of thoracic spine.  IMPRESSION: COPD without superimposed acute cardiopulmonary process.   Electronically Signed   By: Elon Alas   On: 01/12/2014 06:12   Ct Abdomen Pelvis W Contrast  01/12/2014   CLINICAL DATA:  Abdominal and back pain  EXAM: CT ABDOMEN AND PELVIS WITH CONTRAST  TECHNIQUE: Multidetector CT imaging of the abdomen and pelvis was performed using the standard protocol following bolus administration of intravenous contrast.  CONTRAST:  65mL OMNIPAQUE IOHEXOL 300 MG/ML SOLN, 12mL OMNIPAQUE IOHEXOL 300 MG/ML SOLN  COMPARISON:  07/27/2012  FINDINGS: BODY WALL: Unremarkable.  LOWER CHEST: Unremarkable.  ABDOMEN/PELVIS:  Liver: No focal abnormality.  Biliary: No evidence of biliary obstruction or stone.  Pancreas:  Unremarkable.  Spleen: Unremarkable.  Adrenals: Unremarkable.  Kidneys and ureters: The right kidney is asymmetrically small and lobulated, secondary to pyelonephritis seen 07/27/2012. No hydronephrosis or nephrolithiasis.  Bladder: Unremarkable.  Reproductive: Unremarkable.  Bowel: No obstruction. Normal appendix.  Retroperitoneum: No mass or adenopathy.  Peritoneum: No ascites or pneumoperitoneum.  Vascular: There is a conical outpouching from the anterior and left aspect of the descending thoracic aorta, just above the hiatus. The abnormality measures approximately 16 mm in depth and has a wide  neck at 2.6 cm. The neck appears wider compared to previous, but there is no increasing depth of the aneurysm. No evidence of leakage. This could be atherosclerotic or posttraumatic in etiology.  OSSEOUS: There is spondylotic endplate spurring and degenerative disc bulging in the lower lumbar spine, but no acute findings to explain back pain.  IMPRESSION: 1. No acute intra-abdominal findings. 2. Marked right renal scarring after pyelonephritis in 2014. 3. Chronic 16 mm saccular aneurysm from the distal thoracic aorta.   Electronically Signed   By: Jorje Guild M.D.   On: 01/12/2014 06:29   MDM   Final diagnoses:  Right-sided low back pain without sciatica  Elevated transaminase level  Abdominal aortic aneurysm    Back and right upper quadrant pain with weight loss in a cigarette smoker. This is certainly worrisome for possible occult malignancy. Old records are reviewed and he was seen in the ED 8 days ago and diagnosed with back pain. Lumbar spine x-rays were done on that occasion which were unremarkable. He was discharged with prescriptions for methocarbamol, naproxen, and oxycodone-acetaminophen. Will get complete metabolic panel and send patient for chest x-ray and obtain CT of abdomen and pelvis to look for occult malignancy.  Workup it does not show evidence of malignancy and there is no clear etiology for his back pain seen. Incidental finding of saccular aneurysm of the aorta which will need to be followed as an outpatient. Mild elevation of transaminases is probably not clinically significant but will need to be followed by his PCP. Some bulging discs were seen in the lumbar spine. He'll be sent for MRI. Case is signed out to Dr. Stevie Kern.  Delora Fuel, MD 19/37/90 2409

## 2014-01-12 NOTE — ED Notes (Signed)
Pt states he has been progressively more weak and worsening pain to right lower back over the past 6 weeks, was seen here recently for same.

## 2014-01-12 NOTE — ED Provider Notes (Signed)
D/w Pt MRI result. Patient / Family / Caregiver informed of clinical course, understand medical decision-making process, and agree with plan.  Babette Relic, MD 01/12/14 1022

## 2014-02-02 ENCOUNTER — Encounter (HOSPITAL_COMMUNITY): Payer: Self-pay | Admitting: Emergency Medicine

## 2014-02-02 ENCOUNTER — Observation Stay (HOSPITAL_COMMUNITY)
Admission: EM | Admit: 2014-02-02 | Discharge: 2014-02-03 | Disposition: A | Discharge status: Home / Self Care | Payer: Non-veteran care | Attending: Emergency Medicine | Admitting: Emergency Medicine

## 2014-02-02 ENCOUNTER — Emergency Department (HOSPITAL_COMMUNITY): Payer: Non-veteran care

## 2014-02-02 DIAGNOSIS — R5381 Other malaise: Secondary | ICD-10-CM | POA: Insufficient documentation

## 2014-02-02 DIAGNOSIS — F411 Generalized anxiety disorder: Secondary | ICD-10-CM | POA: Insufficient documentation

## 2014-02-02 DIAGNOSIS — G5600 Carpal tunnel syndrome, unspecified upper limb: Secondary | ICD-10-CM | POA: Insufficient documentation

## 2014-02-02 DIAGNOSIS — Z8673 Personal history of transient ischemic attack (TIA), and cerebral infarction without residual deficits: Secondary | ICD-10-CM | POA: Insufficient documentation

## 2014-02-02 DIAGNOSIS — F329 Major depressive disorder, single episode, unspecified: Secondary | ICD-10-CM | POA: Diagnosis not present

## 2014-02-02 DIAGNOSIS — F419 Anxiety disorder, unspecified: Secondary | ICD-10-CM

## 2014-02-02 DIAGNOSIS — Z87440 Personal history of urinary (tract) infections: Secondary | ICD-10-CM | POA: Diagnosis not present

## 2014-02-02 DIAGNOSIS — G459 Transient cerebral ischemic attack, unspecified: Secondary | ICD-10-CM

## 2014-02-02 DIAGNOSIS — G8929 Other chronic pain: Secondary | ICD-10-CM

## 2014-02-02 DIAGNOSIS — Z79899 Other long term (current) drug therapy: Secondary | ICD-10-CM | POA: Insufficient documentation

## 2014-02-02 DIAGNOSIS — R011 Cardiac murmur, unspecified: Secondary | ICD-10-CM | POA: Insufficient documentation

## 2014-02-02 DIAGNOSIS — E0590 Thyrotoxicosis, unspecified without thyrotoxic crisis or storm: Secondary | ICD-10-CM

## 2014-02-02 DIAGNOSIS — G40909 Epilepsy, unspecified, not intractable, without status epilepticus: Secondary | ICD-10-CM | POA: Insufficient documentation

## 2014-02-02 DIAGNOSIS — Z8619 Personal history of other infectious and parasitic diseases: Secondary | ICD-10-CM | POA: Diagnosis not present

## 2014-02-02 DIAGNOSIS — M129 Arthropathy, unspecified: Secondary | ICD-10-CM | POA: Insufficient documentation

## 2014-02-02 DIAGNOSIS — I10 Essential (primary) hypertension: Secondary | ICD-10-CM | POA: Insufficient documentation

## 2014-02-02 DIAGNOSIS — F3289 Other specified depressive episodes: Secondary | ICD-10-CM | POA: Insufficient documentation

## 2014-02-02 DIAGNOSIS — F17200 Nicotine dependence, unspecified, uncomplicated: Secondary | ICD-10-CM | POA: Diagnosis not present

## 2014-02-02 DIAGNOSIS — R569 Unspecified convulsions: Secondary | ICD-10-CM

## 2014-02-02 DIAGNOSIS — E860 Dehydration: Secondary | ICD-10-CM

## 2014-02-02 DIAGNOSIS — IMO0002 Reserved for concepts with insufficient information to code with codable children: Secondary | ICD-10-CM | POA: Insufficient documentation

## 2014-02-02 DIAGNOSIS — R404 Transient alteration of awareness: Secondary | ICD-10-CM | POA: Diagnosis not present

## 2014-02-02 DIAGNOSIS — Z720 Tobacco use: Secondary | ICD-10-CM

## 2014-02-02 DIAGNOSIS — R5383 Other fatigue: Secondary | ICD-10-CM

## 2014-02-02 LAB — COMPREHENSIVE METABOLIC PANEL
ALT: 77 U/L — ABNORMAL HIGH (ref 0–53)
AST: 33 U/L (ref 0–37)
Albumin: 3.8 g/dL (ref 3.5–5.2)
Alkaline Phosphatase: 65 U/L (ref 39–117)
Anion gap: 11 (ref 5–15)
BUN: 21 mg/dL (ref 6–23)
CO2: 26 mEq/L (ref 19–32)
Calcium: 8.9 mg/dL (ref 8.4–10.5)
Chloride: 102 mEq/L (ref 96–112)
Creatinine, Ser: 0.64 mg/dL (ref 0.50–1.35)
GFR calc Af Amer: >90 mL/min (ref >90)
GFR calc non Af Amer: >90 mL/min (ref >90)
Glucose, Bld: 148 mg/dL — ABNORMAL HIGH (ref 70–99)
Potassium: 3.7 mEq/L (ref 3.7–5.3)
Sodium: 139 mEq/L (ref 137–147)
Total Bilirubin: 0.6 mg/dL (ref 0.3–1.2)
Total Protein: 7.2 g/dL (ref 6.0–8.3)

## 2014-02-02 LAB — CBC WITH DIFFERENTIAL/PLATELET
Basophils Absolute: 0 10*3/uL (ref 0.0–0.1)
Basophils Relative: 1 % (ref 0–1)
Eosinophils Absolute: 0.1 10*3/uL (ref 0.0–0.7)
Eosinophils Relative: 1 % (ref 0–5)
HCT: 43.8 % (ref 39.0–52.0)
Hemoglobin: 15.9 g/dL (ref 13.0–17.0)
Lymphocytes Relative: 35 % (ref 12–46)
Lymphs Abs: 2.2 10*3/uL (ref 0.7–4.0)
MCH: 32.4 pg (ref 26.0–34.0)
MCHC: 36.3 g/dL — ABNORMAL HIGH (ref 30.0–36.0)
MCV: 89.2 fL (ref 78.0–100.0)
Monocytes Absolute: 0.5 10*3/uL (ref 0.1–1.0)
Monocytes Relative: 7 % (ref 3–12)
Neutro Abs: 3.6 10*3/uL (ref 1.7–7.7)
Neutrophils Relative %: 56 % (ref 43–77)
Platelets: 178 10*3/uL (ref 150–400)
RBC: 4.91 MIL/uL (ref 4.22–5.81)
RDW: 12.9 % (ref 11.5–15.5)
WBC: 6.3 10*3/uL (ref 4.0–10.5)

## 2014-02-02 LAB — URINALYSIS, ROUTINE W REFLEX MICROSCOPIC
Bilirubin Urine: NEGATIVE
Glucose, UA: NEGATIVE mg/dL
Hgb urine dipstick: NEGATIVE
Ketones, ur: NEGATIVE mg/dL
Leukocytes, UA: NEGATIVE
Nitrite: NEGATIVE
Protein, ur: NEGATIVE mg/dL
Specific Gravity, Urine: 1.025 (ref 1.005–1.030)
Urobilinogen, UA: 1 mg/dL (ref 0.0–1.0)
pH: 6 (ref 5.0–8.0)

## 2014-02-02 LAB — TROPONIN I: Troponin I: <0.3 ng/mL (ref <0.30)

## 2014-02-02 LAB — AMMONIA: Ammonia: 46 umol/L (ref 11–60)

## 2014-02-02 LAB — CBG MONITORING, ED: Glucose-Capillary: 133 mg/dL — ABNORMAL HIGH (ref 70–99)

## 2014-02-02 MED ORDER — MORPHINE SULFATE 4 MG/ML IJ SOLN
4.0000 mg | INTRAMUSCULAR | Status: DC | PRN
  Administered 2014-02-02 – 2014-02-03 (×2): 4 mg via INTRAVENOUS
  Filled 2014-02-02 (×2): qty 1

## 2014-02-02 MED ORDER — KETOROLAC TROMETHAMINE 60 MG/2ML IM SOLN
60.0000 mg | Freq: Every day | INTRAMUSCULAR | Status: DC
  Filled 2014-02-02: qty 2

## 2014-02-02 MED ORDER — AMLODIPINE BESYLATE 5 MG PO TABS
5.0000 mg | ORAL_TABLET | Freq: Every day | ORAL | Status: DC
  Filled 2014-02-02: qty 1

## 2014-02-02 MED ORDER — GABAPENTIN 300 MG PO CAPS
600.0000 mg | ORAL_CAPSULE | Freq: Three times a day (TID) | ORAL | Status: DC
  Administered 2014-02-02 – 2014-02-03 (×2): 600 mg via ORAL
  Filled 2014-02-02 (×2): qty 2

## 2014-02-02 MED ORDER — ONDANSETRON HCL 4 MG/2ML IJ SOLN: 4.0000 mg | Freq: Four times a day (QID) | INTRAMUSCULAR | Status: DC | PRN

## 2014-02-02 MED ORDER — HYDROMORPHONE HCL PF 1 MG/ML IJ SOLN
1.0000 mg | INTRAMUSCULAR | Status: DC | PRN
  Administered 2014-02-02: 1 mg via INTRAVENOUS
  Filled 2014-02-02: qty 1

## 2014-02-02 MED ORDER — HEPARIN SODIUM (PORCINE) 5000 UNIT/ML IJ SOLN
5000.0000 [IU] | Freq: Three times a day (TID) | INTRAMUSCULAR | Status: DC
  Administered 2014-02-02 – 2014-02-03 (×2): 5000 [IU] via SUBCUTANEOUS
  Filled 2014-02-02 (×2): qty 1

## 2014-02-02 MED ORDER — HYDRALAZINE HCL 20 MG/ML IJ SOLN
10.0000 mg | INTRAMUSCULAR | Status: DC | PRN
Start: 2014-02-02 — End: 2014-02-03

## 2014-02-02 MED ORDER — TAMSULOSIN HCL 0.4 MG PO CAPS
0.4000 mg | ORAL_CAPSULE | Freq: Every day | ORAL | Status: DC
  Filled 2014-02-02: qty 1

## 2014-02-02 MED ORDER — HYDROMORPHONE HCL PF 1 MG/ML IJ SOLN
1.0000 mg | Freq: Once | INTRAMUSCULAR | Status: AC
  Administered 2014-02-02: 1 mg via INTRAVENOUS
  Filled 2014-02-02: qty 1

## 2014-02-02 MED ORDER — GABAPENTIN 300 MG PO CAPS
ORAL_CAPSULE | ORAL | Status: AC
  Filled 2014-02-02: qty 2

## 2014-02-02 MED ORDER — VENLAFAXINE HCL ER 75 MG PO CP24
75.0000 mg | ORAL_CAPSULE | Freq: Every day | ORAL | Status: DC
  Administered 2014-02-03: 75 mg via ORAL
  Filled 2014-02-02: qty 1

## 2014-02-02 MED ORDER — DONEPEZIL HCL 5 MG PO TABS
10.0000 mg | ORAL_TABLET | Freq: Every day | ORAL | Status: DC
  Administered 2014-02-02: 10 mg via ORAL
  Filled 2014-02-02: qty 2

## 2014-02-02 MED ORDER — LISINOPRIL 10 MG PO TABS
20.0000 mg | ORAL_TABLET | Freq: Every day | ORAL | Status: DC
  Filled 2014-02-02: qty 2

## 2014-02-02 MED ORDER — ONDANSETRON HCL 4 MG/2ML IJ SOLN
4.0000 mg | Freq: Once | INTRAMUSCULAR | Status: AC
  Administered 2014-02-02: 4 mg via INTRAVENOUS
  Filled 2014-02-02: qty 2

## 2014-02-02 MED ORDER — PROPRANOLOL HCL ER 80 MG PO CP24
80.0000 mg | ORAL_CAPSULE | Freq: Every day | ORAL | Status: DC
  Filled 2014-02-02 (×4): qty 1

## 2014-02-02 MED ORDER — ONDANSETRON HCL 4 MG PO TABS: 4.0000 mg | ORAL_TABLET | Freq: Four times a day (QID) | ORAL | Status: DC | PRN

## 2014-02-02 MED ORDER — TRAZODONE HCL 50 MG PO TABS
50.0000 mg | ORAL_TABLET | Freq: Every day | ORAL | Status: DC
  Administered 2014-02-02: 50 mg via ORAL
  Filled 2014-02-02: qty 1

## 2014-02-02 MED ORDER — ALPRAZOLAM 0.5 MG PO TABS
0.5000 mg | ORAL_TABLET | Freq: Three times a day (TID) | ORAL | Status: DC | PRN
  Administered 2014-02-02: 0.5 mg via ORAL
  Filled 2014-02-02: qty 1

## 2014-02-02 MED ORDER — SODIUM CHLORIDE 0.9 % IV BOLUS (SEPSIS)
1000.0000 mL | Freq: Once | INTRAVENOUS | Status: AC
  Administered 2014-02-02: 1000 mL via INTRAVENOUS

## 2014-02-02 MED ORDER — SODIUM CHLORIDE 0.45 % IV SOLN
INTRAVENOUS | Status: DC
Start: 2014-02-02 — End: 2014-02-03
  Administered 2014-02-02 – 2014-02-03 (×2): via INTRAVENOUS

## 2014-02-02 MED ORDER — GABAPENTIN 600 MG PO TABS
600.0000 mg | ORAL_TABLET | Freq: Three times a day (TID) | ORAL | Status: DC
  Filled 2014-02-02 (×2): qty 1

## 2014-02-02 MED ORDER — METHIMAZOLE 5 MG PO TABS
10.0000 mg | ORAL_TABLET | Freq: Every day | ORAL | Status: DC
  Filled 2014-02-02: qty 2
  Filled 2014-02-02: qty 1
  Filled 2014-02-02 (×2): qty 2

## 2014-02-02 MED ORDER — ACETAMINOPHEN 500 MG PO TABS: 1000.0000 mg | ORAL_TABLET | Freq: Three times a day (TID) | ORAL | Status: DC

## 2014-02-02 MED ORDER — PROPRANOLOL HCL 20 MG PO TABS: 80.0000 mg | ORAL_TABLET | Freq: Every day | ORAL | Status: DC

## 2014-02-02 MED ORDER — LORAZEPAM 2 MG/ML IJ SOLN: 2.0000 mg | Freq: Once | INTRAMUSCULAR | Status: AC | PRN

## 2014-02-02 NOTE — H&P (Addendum)
[facility] Admission History and Physical   Patient name: Eric Tran Medical record number: [id number] Date of birth: 1951-06-20 Age: 63 y.o. Gender: male  Primary Care Provider: Raquel James Consultants: none Code Status: Full  Chief Complaint: generalized pain  Assessment and Plan: Eric Tran is a 63 y.o. male presenting with generalized pain and AMS . PMH is significant for multiple CVA, SZR, HTN, Hep C, Anxiety/Depression.   AMS: Resolving. Likely related to post ictal state from seizure vs TIA/stroke vs dementia progression. No metabolic cause immediately evident. Pharmacologic cause not likely as no recent changes in medications and no overdose. Stroke vs TIA possible given history but CT negative for new infarct and no new physicial deficits. Pt w/ Dx of memory problems and on Aricept - ?? Dementia??. Ammonia nml - Admit for obs - Neuro checks - MRI if develops new deficits - consider starting Namenda as outpt  Generalized aches: possibly MSK based given possibliity of generalized tonic clonic szr at night and compounded by significant increase in work effort yesterday. Cr nml so rhabdo unlikely at this point. May also be related to progressive naruopathic pain from recurring strokes. Infection unlikely. Outside chance that this is caused by Venlafaxine as this was started <68mo ago.  - continue Gabapentin - toradol x 5 days - +/- tylenol given elevated ALT and h/o Chronic Hep C morphine 4mg  PRN - IVF 1/2NS 134ml/hr  Szrs: last witnessed seizure June 18th. Pt may have seizure last night that was unwitnessed. Pt has seized in his sleep per pts wife. Only on Gabapentin for mgt.  - Continue gaba. - Ativan PRN szr - consider adding Keppra  H/o Stroke: Multiple strokes in the past w/ L sided deficits. No new deficits on presentation - PT/OT as above.  - Hold stroke workup unless shows signs of stroke.   HTN: Hypertensive on admission - continue lisinopril,  Norvase, Propranolol. Consider increasing if continues to be hypertensive  - Hydralazine PRN  BPH: no obstructive symptoms on admission - continue flomax  Hypothyroidism:  - continue tapazone  Tobacco abuse: counseled on quitting and pt would like to quit - Nicotine patch 14mg  Qday (0.5ppd smoker)  FEN/GI: Regular diet  DVT Prophylaxis: Hep [place] TID  Disposition: Obs  History of Present Illness: Eric Tran is a 64 y.o. male presenting with general aches and pains throughout whole body. Pt endorses he over did it yesterday by working out in the yard much more than typical. This morning pts wife reports that he was disoriented and weak in general. No localized deficits. Denies HA, fevers, n/v, CP, palpitations, SOB, loss of bowel or bladder function Last Szr June 18th.  Permenant L sided deficits from previous strokes.   Review Of Systems: Per HPI with the following additions: none Otherwise 12 point review of systems was performed and was unremarkable.  Patient Active Problem List   Diagnosis Date Noted  . TIA (transient ischemic attack) 02/02/2014  . Head injuries 07/24/2013  . Pyelonephritis 07/27/2012  . CAP (community acquired pneumonia) 07/26/2012  . Dehydration 07/26/2012  . Intractable headache 07/26/2012  . Viral syndrome 07/26/2012  . Chronic pain 07/26/2012  . Anxiety disorder 07/26/2012  . Flank pain, acute 07/26/2012  . Hyperthyroidism 07/26/2012  . Tobacco abuse 07/26/2012  . Hyponatremia 07/26/2012  . Chronic hepatitis C 05/01/2011  . Elevated liver function tests 05/01/2011  . Weight loss, unintentional 05/01/2011   Past Medical History: Past Medical History  Diagnosis Date  . Hyperthyroidism   .  Chronic back pain     chronic narcotic use  . Degenerative disc disease   . Carpal tunnel syndrome   . Arthritis   . Hypertension   . Chronic hepatitis C 1980    never had any treatment  . CVA (cerebral infarction) 06/2010  . Anxiety   . Depression   .  UTI (lower urinary tract infection)   . Stroke 2012    left hemiplegia  . Seizures     Epilepsy  . Heart murmur   . Head trauma    Past Surgical History: Past Surgical History  Procedure Laterality Date  . Hand surgery      right  . Neck surgery    . Right hand reconstruction     Social History: History  Substance Use Topics  . Smoking status: Current Every Day Smoker -- 1.00 packs/day for 45 years    Types: Cigarettes  . Smokeless tobacco: Never Used  . Alcohol Use: No     Comment: Denies today 02/02/2014   Additional social history: none  Please also refer to relevant sections of EMR.  Family History: Family History  Problem Relation Age of Onset  . Colon cancer Neg Hx   . Colon polyps Neg Hx   . Cervical cancer Mother   . Lung cancer Sister   . Breast cancer Sister   . Heart failure Father    Allergies and Medications: Allergies  Allergen Reactions  . Morphine And Related Nausea And Vomiting    Can only take through IV   No current facility-administered medications on file prior to encounter.   Current Outpatient Prescriptions on File Prior to Encounter  Medication Sig Dispense Refill  . ALPRAZolam (XANAX) 0.5 MG tablet Take 0.5 mg by mouth 3 (three) times daily as needed. For sleep and anxiety      . amLODipine (NORVASC) 10 MG tablet Take 5 mg by mouth daily.      . Glucosamine-Chondroitin (GLUCOSAMINE CHONDR COMPLEX PO) Take 2 tablets by mouth daily.      Marland Kitchen lisinopril (PRINIVIL,ZESTRIL) 20 MG tablet Take 20 mg by mouth daily.      . methimazole (TAPAZOLE) 10 MG tablet Take 10 mg by mouth daily.      . Multiple Vitamin (MULTIVITAMIN WITH MINERALS) TABS tablet Take 1 tablet by mouth daily.      . propranolol (INDERAL) 80 MG tablet Take 80 mg by mouth daily.      . Tamsulosin HCl (FLOMAX) 0.4 MG CAPS Take 0.4 mg by mouth daily.       . traZODone (DESYREL) 50 MG tablet Take 50 mg by mouth at bedtime.        Objective: BP 179/104  Pulse 72  Temp(Src) 98.9  F (37.2 C)  Resp 14  Ht 5\' 8"  (1.727 m)  Wt 63.504 kg (140 lb)  BMI 21.29 kg/m2  SpO2 97% Exam: General: WNWD, NAD HEENT: mmm, EOMI, PERRL Cardiovascular: RRR, no m/r/g Respiratory: CTAB, nml effort Abdomen: NABS, soft nonttp Extremities: 2+ pulses, no edema Skin: intact, no rash Neuro: AAOx3. mild loss of sensation in V1-3 distribution on the L (baseline) w/ all other cranial nerves intact. Hip flexion 4/5 on L all other muscle groups in UE and LE 5/5. No dysmetria. rhomberg neg. Ambulation w/o difficulty.   Labs and Imaging: Results for orders placed during the hospital encounter of 02/02/14 (from the past 24 hour(s))  CBC WITH DIFFERENTIAL     Status: Abnormal   Collection Time  02/02/14 12:41 PM      Result Value Ref Range   WBC 6.3  4.0 - 10.5 K/uL   RBC 4.91  4.22 - 5.81 MIL/uL   Hemoglobin 15.9  13.0 - 17.0 g/dL   HCT 43.8  39.0 - 52.0 %   MCV 89.2  78.0 - 100.0 fL   MCH 32.4  26.0 - 34.0 pg   MCHC 36.3 (*) 30.0 - 36.0 g/dL   RDW 12.9  11.5 - 15.5 %   Platelets 178  150 - 400 K/uL   Neutrophils Relative % 56  43 - 77 %   Neutro Abs 3.6  1.7 - 7.7 K/uL   Lymphocytes Relative 35  12 - 46 %   Lymphs Abs 2.2  0.7 - 4.0 K/uL   Monocytes Relative 7  3 - 12 %   Monocytes Absolute 0.5  0.1 - 1.0 K/uL   Eosinophils Relative 1  0 - 5 %   Eosinophils Absolute 0.1  0.0 - 0.7 K/uL   Basophils Relative 1  0 - 1 %   Basophils Absolute 0.0  0.0 - 0.1 K/uL  COMPREHENSIVE METABOLIC PANEL     Status: Abnormal   Collection Time    02/02/14 12:41 PM      Result Value Ref Range   Sodium 139  137 - 147 mEq/L   Potassium 3.7  3.7 - 5.3 mEq/L   Chloride 102  96 - 112 mEq/L   CO2 26  19 - 32 mEq/L   Glucose, Bld 148 (*) 70 - 99 mg/dL   BUN 21  6 - 23 mg/dL   Creatinine, Ser 0.64  0.50 - 1.35 mg/dL   Calcium 8.9  8.4 - 10.5 mg/dL   Total Protein 7.2  6.0 - 8.3 g/dL   Albumin 3.8  3.5 - 5.2 g/dL   AST 33  0 - 37 U/L   ALT 77 (*) 0 - 53 U/L   Alkaline Phosphatase 65  39 - 117  U/L   Total Bilirubin 0.6  0.3 - 1.2 mg/dL   GFR calc non Af Amer >90  >90 mL/min   GFR calc Af Amer >90  >90 mL/min   Anion gap 11  5 - 15  TROPONIN I     Status: None   Collection Time    02/02/14 12:41 PM      Result Value Ref Range   Troponin I <0.30  <0.30 ng/mL  CBG MONITORING, ED     Status: Abnormal   Collection Time    02/02/14 12:46 PM      Result Value Ref Range   Glucose-Capillary 133 (*) 70 - 99 mg/dL  URINALYSIS, ROUTINE W REFLEX MICROSCOPIC     Status: None   Collection Time    02/02/14  2:04 PM      Result Value Ref Range   Color, Urine YELLOW  YELLOW   APPearance CLEAR  CLEAR   Specific Gravity, Urine 1.025  1.005 - 1.030   pH 6.0  5.0 - 8.0   Glucose, UA NEGATIVE  NEGATIVE mg/dL   Hgb urine dipstick NEGATIVE  NEGATIVE   Bilirubin Urine NEGATIVE  NEGATIVE   Ketones, ur NEGATIVE  NEGATIVE mg/dL   Protein, ur NEGATIVE  NEGATIVE mg/dL   Urobilinogen, UA 1.0  0.0 - 1.0 mg/dL   Nitrite NEGATIVE  NEGATIVE   Leukocytes, UA NEGATIVE  NEGATIVE  AMMONIA     Status: None   Collection Time    02/02/14  4:22 PM      Result Value Ref Range   Ammonia 46  11 - 60 umol/L    Dg Chest 2 View  02/02/2014   CLINICAL DATA:  Weakness, smoker  EXAM: CHEST  2 VIEW  COMPARISON:  01/12/2014  FINDINGS: Stable hyperinflation suggesting COPD/ emphysema. Normal heart size and vascularity. Lungs remain clear. No focal pneumonia, edema, collapse or consolidation. No significant effusion or pneumothorax. Trachea midline. Atherosclerosis of the aorta.  IMPRESSION: Stable COPD/emphysema with hyperinflation. No superimposed acute process   Electronically Signed   By: Daryll Brod M.D.   On: 02/02/2014 13:53   Dg Chest 2 View  01/12/2014   CLINICAL DATA:  Increasing weakness and back pain.  Smoker.  EXAM: CHEST  2 VIEW  COMPARISON:  Chest radiograph December 08, 2013  FINDINGS: Cardiac silhouette remains unremarkable. Mildly calcified aortic knob, mediastinal silhouette is nonsuspicious. No pleural  effusions or focal consolidations. Increased lung volumes with flattened hemidiaphragms. No pneumothorax.  ACDF. Remote left posterior rib fractures. Mild degenerative change of thoracic spine.  IMPRESSION: COPD without superimposed acute cardiopulmonary process.   Electronically Signed   By: Elon Alas   On: 01/12/2014 06:12   Dg Lumbar Spine Complete  01/05/2014   CLINICAL DATA:  Low back pain for 3 weeks after falling from a step ladder.  EXAM: LUMBAR SPINE - COMPLETE 4+ VIEW  COMPARISON:  CT abdomen and pelvis 07/27/2012  FINDINGS: Mild degenerative endplate hypertrophic changes in the lumbar spine. There is no evidence of lumbar spine fracture. Alignment is normal. Intervertebral disc spaces are maintained. Vascular calcifications.  IMPRESSION: Degenerative changes in the lumbar spine. No acute bony abnormalities appreciated.   Electronically Signed   By: Lucienne Capers M.D.   On: 01/05/2014 00:51   Ct Head Wo Contrast  02/02/2014   CLINICAL DATA:  Difficulty walking for 1 day.  History of stroke.  EXAM: CT HEAD WITHOUT CONTRAST  TECHNIQUE: Contiguous axial images were obtained from the base of the skull through the vertex without contrast.  COMPARISON:  07/24/2013.  FINDINGS: Premature for age cerebral and cerebellar atrophy. Hypoattenuation of the white matter consistent with chronic microvascular ischemic change. Remote periventricular and thalamic lacunar infarcts are stable.  No evidence for acute infarction, hemorrhage, mass lesion, hydrocephalus, or extra-axial fluid.  Calvarium intact.  No sinus or mastoid disease.  IMPRESSION: No acute intracranial abnormality.  Stable exam.   Electronically Signed   By: Rolla Flatten M.D.   On: 02/02/2014 16:14   Mr Lumbar Spine Wo Contrast  01/12/2014   CLINICAL DATA:  Low back pain over the past 6 weeks with multiple falls.  EXAM: MRI LUMBAR SPINE WITHOUT CONTRAST  TECHNIQUE: Multiplanar, multisequence MR imaging of the lumbar spine was performed. No  intravenous contrast was administered.  COMPARISON:  Lumbar spine radiographs 01/05/2014. CT of the abdomen and pelvis was performed earlier today  FINDINGS: The alignment is anatomic except for trace retrolisthesis L3-L4. The conus is normal. There is no lumbar compression deformity. Slight marrow heterogeneity without worrisome osseous lesion. Intervertebral disc heights are essentially normal for age. Chronic saccular aneurysm described on CT abdomen better characterized on that examination. Chronic LEFT renal scarring.  The individual disc spaces were examined with axial images as follows:  L1-L2:  Normal.  L2-L3: There is mild annular bulging centrally. There is a foraminal protrusion on the RIGHT extending to the inferior foramen. Anterior protrusion into the retroperitoneum could be associated with lumbago. There is mild facet and ligamentum  flavum hypertrophy without impingement.  L3-L4: Trace retrolisthesis. Shallow central protrusion posteriorly. Anterior protrusion into the retroperitoneum could be associated with lumbago. Moderate facet and ligamentum flavum hypertrophy. Mild central canal stenosis. Either L4 nerve root could be affected.  L4-L5: Mild bulge. Mild facet and ligamentum flavum hypertrophy. Annular rent most notably on the RIGHT, with mild annular T2 bright signal on the LEFT. The RIGHT neural foramen is narrowed, and RIGHT L4 nerve root impingement is possible.  L5-S1:  Mild bulge.  Mild facet arthropathy.  No impingement.  IMPRESSION: Modest lumbar intervertebral disc disease as described. Correlate clinically for radicular symptoms; see comments above.  No lumbar compression deformity or worrisome osseous lesion.   Electronically Signed   By: Rolla Flatten M.D.   On: 01/12/2014 08:26   Ct Abdomen Pelvis W Contrast  01/12/2014   CLINICAL DATA:  Abdominal and back pain  EXAM: CT ABDOMEN AND PELVIS WITH CONTRAST  TECHNIQUE: Multidetector CT imaging of the abdomen and pelvis was performed  using the standard protocol following bolus administration of intravenous contrast.  CONTRAST:  59mL OMNIPAQUE IOHEXOL 300 MG/ML SOLN, 154mL OMNIPAQUE IOHEXOL 300 MG/ML SOLN  COMPARISON:  07/27/2012  FINDINGS: BODY WALL: Unremarkable.  LOWER CHEST: Unremarkable.  ABDOMEN/PELVIS:  Liver: No focal abnormality.  Biliary: No evidence of biliary obstruction or stone.  Pancreas: Unremarkable.  Spleen: Unremarkable.  Adrenals: Unremarkable.  Kidneys and ureters: The right kidney is asymmetrically small and lobulated, secondary to pyelonephritis seen 07/27/2012. No hydronephrosis or nephrolithiasis.  Bladder: Unremarkable.  Reproductive: Unremarkable.  Bowel: No obstruction. Normal appendix.  Retroperitoneum: No mass or adenopathy.  Peritoneum: No ascites or pneumoperitoneum.  Vascular: There is a conical outpouching from the anterior and left aspect of the descending thoracic aorta, just above the hiatus. The abnormality measures approximately 16 mm in depth and has a wide neck at 2.6 cm. The neck appears wider compared to previous, but there is no increasing depth of the aneurysm. No evidence of leakage. This could be atherosclerotic or posttraumatic in etiology.  OSSEOUS: There is spondylotic endplate spurring and degenerative disc bulging in the lower lumbar spine, but no acute findings to explain back pain.  IMPRESSION: 1. No acute intra-abdominal findings. 2. Marked right renal scarring after pyelonephritis in 2014. 3. Chronic 16 mm saccular aneurysm from the distal thoracic aorta.   Electronically Signed   By: Jorje Guild M.D.   On: 01/12/2014 06:29     Waldemar Dickens, MD Family Medicine 02/02/2014, 5:13 PM [facility]

## 2014-02-02 NOTE — Plan of Care (Signed)
Problem: Acute Treatment Outcomes Goal: 02 Sats > 94% Outcome: Completed/Met Date Met:  02/02/14 On RA

## 2014-02-02 NOTE — ED Notes (Signed)
Pt states he has felt extremely weak since yesterday evening. Pts spouse states that when he woke up this morning he "had altered mental status". States he was just not his usual self. Pt told wife that he was afraid he may have had another stroke
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
## 2014-02-03 DIAGNOSIS — G8929 Other chronic pain: Secondary | ICD-10-CM

## 2014-02-03 DIAGNOSIS — R404 Transient alteration of awareness: Secondary | ICD-10-CM

## 2014-02-03 DIAGNOSIS — E0590 Thyrotoxicosis, unspecified without thyrotoxic crisis or storm: Secondary | ICD-10-CM

## 2014-02-03 DIAGNOSIS — F411 Generalized anxiety disorder: Secondary | ICD-10-CM

## 2014-02-03 DIAGNOSIS — F17200 Nicotine dependence, unspecified, uncomplicated: Secondary | ICD-10-CM

## 2014-02-03 DIAGNOSIS — R569 Unspecified convulsions: Secondary | ICD-10-CM

## 2014-02-03 DIAGNOSIS — E860 Dehydration: Secondary | ICD-10-CM

## 2014-02-03 LAB — COMPREHENSIVE METABOLIC PANEL
ALT: 54 U/L — ABNORMAL HIGH (ref 0–53)
AST: 26 U/L (ref 0–37)
Albumin: 3.2 g/dL — ABNORMAL LOW (ref 3.5–5.2)
Alkaline Phosphatase: 54 U/L (ref 39–117)
Anion gap: 11 (ref 5–15)
BUN: 17 mg/dL (ref 6–23)
CO2: 23 mEq/L (ref 19–32)
Calcium: 8.4 mg/dL (ref 8.4–10.5)
Chloride: 106 mEq/L (ref 96–112)
Creatinine, Ser: 0.66 mg/dL (ref 0.50–1.35)
GFR calc Af Amer: >90 mL/min (ref >90)
GFR calc non Af Amer: >90 mL/min (ref >90)
Glucose, Bld: 76 mg/dL (ref 70–99)
Potassium: 4 mEq/L (ref 3.7–5.3)
Sodium: 140 mEq/L (ref 137–147)
Total Bilirubin: 0.6 mg/dL (ref 0.3–1.2)
Total Protein: 6 g/dL (ref 6.0–8.3)

## 2014-02-03 MED ORDER — ALPRAZOLAM 0.5 MG PO TABS: 0.5000 mg | ORAL_TABLET | Freq: Three times a day (TID) | ORAL | Status: DC | PRN

## 2014-02-03 MED ORDER — ASPIRIN EC 81 MG PO TBEC: 81.0000 mg | DELAYED_RELEASE_TABLET | Freq: Every day | ORAL | Status: DC

## 2014-02-03 MED ORDER — OXYCODONE-ACETAMINOPHEN 10-325 MG PO TABS: 1.0000 | ORAL_TABLET | Freq: Four times a day (QID) | ORAL | Status: DC | PRN

## 2014-02-03 NOTE — Care Management Note (Signed)
    Page 1 of 1   02/03/2014     12:07:06 PM CARE MANAGEMENT NOTE 02/03/2014  Patient:  Eric Tran, Eric Tran   Account Number:  [id number]  Date Initiated:  02/03/2014  Documentation initiated by:  Theophilus Kinds  Subjective/Objective Assessment:   Pt admitted from home with possible seizures. Pt lives with his wife and will return home at discharge. Pt is fairly independent with ADl's. Pt is connected with [facility] and the [place] [facility].     Action/Plan:   CM called and notified [facility] about pts admission. Pt discharged home today. No other CM needs noted.   Anticipated DC Date:  02/03/2014   Anticipated DC Plan:  [place]  CM consult      Choice offered to / List presented to:             Status of service:  Completed, signed off Medicare Important Message given?   (If response is "NO", the following Medicare IM given date fields will be blank) Date Medicare IM given:   Medicare IM given by:   Date Additional Medicare IM given:   Additional Medicare IM given by:    Discharge Disposition:  HOME/SELF CARE  Per UR Regulation:    If discussed at Long Length of Stay Meetings, dates discussed:    Comments:  02/03/14 [place], RN BSN CM

## 2014-02-03 NOTE — Discharge Instructions (Signed)
Smoking Cessation Quitting smoking is important to your health and has many advantages. However, it is not always easy to quit since nicotine is a very addictive drug. Oftentimes, people try 3 times or more before being able to quit. This document explains the best ways for you to prepare to quit smoking. Quitting takes hard work and a lot of effort, but you can do it. ADVANTAGES OF QUITTING SMOKING  You will live longer, feel better, and live better.  Your body will feel the impact of quitting smoking almost immediately.  Within 20 minutes, blood pressure decreases. Your pulse returns to its normal level.  After 8 hours, carbon monoxide levels in the blood return to normal. Your oxygen level increases.  After 24 hours, the chance of having a heart attack starts to decrease. Your breath, hair, and body stop smelling like smoke.  After 48 hours, damaged nerve endings begin to recover. Your sense of taste and smell improve.  After 72 hours, the body is virtually free of nicotine. Your bronchial tubes relax and breathing becomes easier.  After 2 to 12 weeks, lungs can hold more air. Exercise becomes easier and circulation improves.  The risk of having a heart attack, stroke, cancer, or lung disease is greatly reduced.  After 1 year, the risk of coronary heart disease is cut in half.  After 5 years, the risk of stroke falls to the same as a nonsmoker.  After 10 years, the risk of lung cancer is cut in half and the risk of other cancers decreases significantly.  After 15 years, the risk of coronary heart disease drops, usually to the level of a nonsmoker.  If you are pregnant, quitting smoking will improve your chances of having a healthy baby.  The people you live with, especially any children, will be healthier.  You will have extra money to spend on things other than cigarettes. QUESTIONS TO THINK ABOUT BEFORE ATTEMPTING TO QUIT You may want to talk about your answers with your  health care provider.  Why do you want to quit?  If you tried to quit in the past, what helped and what did not?  What will be the most difficult situations for you after you quit? How will you plan to handle them?  Who can help you through the tough times? Your family? Friends? A health care provider?  What pleasures do you get from smoking? What ways can you still get pleasure if you quit? Here are some questions to ask your health care provider:  How can you help me to be successful at quitting?  What medicine do you think would be best for me and how should I take it?  What should I do if I need more help?  What is smoking withdrawal like? How can I get information on withdrawal? GET READY  Set a quit date.  Change your environment by getting rid of all cigarettes, ashtrays, matches, and lighters in your home, car, or work. Do not let people smoke in your home.  Review your past attempts to quit. Think about what worked and what did not. GET SUPPORT AND ENCOURAGEMENT You have a better chance of being successful if you have help. You can get support in many ways.  Tell your family, friends, and coworkers that you are going to quit and need their support. Ask them not to smoke around you.  Get individual, group, or telephone counseling and support. Programs are available at [employer] and health centers. Call  your local health department for information about programs in your area.  Spiritual beliefs and practices may help some smokers quit.  Download a "quit meter" on your computer to keep track of quit statistics, such as how long you have gone without smoking, cigarettes not smoked, and money saved.  Get a self-help book about quitting smoking and staying off tobacco. Milnor yourself from urges to smoke. Talk to someone, go for a walk, or occupy your time with a task.  Change your normal routine. Take a different route to work.  Drink tea instead of coffee. Eat breakfast in a different place.  Reduce your stress. Take a hot bath, exercise, or read a book.  Plan something enjoyable to do every day. Reward yourself for not smoking.  Explore interactive web-based programs that specialize in helping you quit. GET MEDICINE AND USE IT CORRECTLY Medicines can help you stop smoking and decrease the urge to smoke. Combining medicine with the above behavioral methods and support can greatly increase your chances of successfully quitting smoking.  Nicotine replacement therapy helps deliver nicotine to your body without the negative effects and risks of smoking. Nicotine replacement therapy includes nicotine gum, lozenges, inhalers, nasal sprays, and skin patches. Some may be available over-the-counter and others require a prescription.  Antidepressant medicine helps people abstain from smoking, but how this works is unknown. This medicine is available by prescription.  Nicotinic receptor partial agonist medicine simulates the effect of nicotine in your brain. This medicine is available by prescription. Ask your health care provider for advice about which medicines to use and how to use them based on your health history. Your health care provider will tell you what side effects to look out for if you choose to be on a medicine or therapy. Carefully read the information on the package. Do not use any other product containing nicotine while using a nicotine replacement product.  RELAPSE OR DIFFICULT SITUATIONS Most relapses occur within the first 3 months after quitting. Do not be discouraged if you start smoking again. Remember, most people try several times before finally quitting. You may have symptoms of withdrawal because your body is used to nicotine. You may crave cigarettes, be irritable, feel very hungry, cough often, get headaches, or have difficulty concentrating. The withdrawal symptoms are only temporary. They are strongest  when you first quit, but they will go away within 10-14 days. To reduce the chances of relapse, try to:  Avoid drinking alcohol. Drinking lowers your chances of successfully quitting.  Reduce the amount of caffeine you consume. Once you quit smoking, the amount of caffeine in your body increases and can give you symptoms, such as a rapid heartbeat, sweating, and anxiety.  Avoid smokers because they can make you want to smoke.  Do not let weight gain distract you. Many smokers will gain weight when they quit, usually less than 10 pounds. Eat a healthy diet and stay active. You can always lose the weight gained after you quit.  Find ways to improve your mood other than smoking. FOR MORE INFORMATION  www.smokefree.gov  Document Released: 06/10/2001 Document Revised: 10/31/2013 Document Reviewed: 09/25/2011 [facility] Patient Information 2015 [place], [place]. This information is not intended to replace advice given to you by your health care provider. Make sure you discuss any questions you have with your health care provider.  Smoking Cessation, Tips for Success If you are ready to quit smoking, congratulations! You have chosen to help yourself be healthier.  Cigarettes bring nicotine, tar, carbon monoxide, and other irritants into your body. Your lungs, heart, and blood vessels will be able to work better without these poisons. There are many different ways to quit smoking. Nicotine gum, nicotine patches, a nicotine inhaler, or nicotine nasal spray can help with physical craving. Hypnosis, support groups, and medicines help break the habit of smoking. WHAT THINGS CAN I DO TO MAKE QUITTING EASIER?  Here are some tips to help you quit for good:  Pick a date when you will quit smoking completely. Tell all of your friends and family about your plan to quit on that date.  Do not try to slowly cut down on the number of cigarettes you are smoking. Pick a quit date and quit smoking completely starting on  that day.  Throw away all cigarettes.   Clean and remove all ashtrays from your home, work, and car.  On a card, write down your reasons for quitting. Carry the card with you and read it when you get the urge to smoke.  Cleanse your body of nicotine. Drink enough water and fluids to keep your urine clear or pale yellow. Do this after quitting to flush the nicotine from your body.  Learn to predict your moods. Do not let a bad situation be your excuse to have a cigarette. Some situations in your life might tempt you into wanting a cigarette.  Never have "just one" cigarette. It leads to wanting another and another. Remind yourself of your decision to quit.  Change habits associated with smoking. If you smoked while driving or when feeling stressed, try other activities to replace smoking. Stand up when drinking your coffee. Brush your teeth after eating. Sit in a different chair when you read the paper. Avoid alcohol while trying to quit, and try to drink fewer caffeinated beverages. Alcohol and caffeine may urge you to smoke.  Avoid foods and drinks that can trigger a desire to smoke, such as sugary or spicy foods and alcohol.  Ask people who smoke not to smoke around you.  Have something planned to do right after eating or having a cup of coffee. For example, plan to take a walk or exercise.  Try a relaxation exercise to calm you down and decrease your stress. Remember, you may be tense and nervous for the first 2 weeks after you quit, but this will pass.  Find new activities to keep your hands busy. Play with a pen, coin, or rubber band. Doodle or draw things on paper.  Brush your teeth right after eating. This will help cut down on the craving for the taste of tobacco after meals. You can also try mouthwash.   Use oral substitutes in place of cigarettes. Try using lemon drops, carrots, cinnamon sticks, or chewing gum. Keep them handy so they are available when you have the urge to  smoke.  When you have the urge to smoke, try deep breathing.  Designate your home as a nonsmoking area.  If you are a heavy smoker, ask your health care provider about a prescription for nicotine chewing gum. It can ease your withdrawal from nicotine.  Reward yourself. Set aside the cigarette money you save and buy yourself something nice.  Look for support from others. Join a support group or smoking cessation program. Ask someone at home or at work to help you with your plan to quit smoking.  Always ask yourself, "Do I need this cigarette or is this just a reflex?" Tell yourself, "  Today, I choose not to smoke," or "I do not want to smoke." You are reminding yourself of your decision to quit.  Do not replace cigarette smoking with electronic cigarettes (commonly called e-cigarettes). The safety of e-cigarettes is unknown, and some may contain harmful chemicals.  If you relapse, do not give up! Plan ahead and think about what you will do the next time you get the urge to smoke. HOW WILL I FEEL WHEN I QUIT SMOKING? You may have symptoms of withdrawal because your body is used to nicotine (the addictive substance in cigarettes). You may crave cigarettes, be irritable, feel very hungry, cough often, get headaches, or have difficulty concentrating. The withdrawal symptoms are only temporary. They are strongest when you first quit but will go away within 10-14 days. When withdrawal symptoms occur, stay in control. Think about your reasons for quitting. Remind yourself that these are signs that your body is healing and getting used to being without cigarettes. Remember that withdrawal symptoms are easier to treat than the major diseases that smoking can cause.  Even after the withdrawal is over, expect periodic urges to smoke. However, these cravings are generally short lived and will go away whether you smoke or not. Do not smoke! WHAT RESOURCES ARE AVAILABLE TO HELP ME QUIT SMOKING? Your health care  provider can direct you to community resources or hospitals for support, which may include:  Group support.  Education.  Hypnosis.  Therapy. Document Released: 03/14/2004 Document Revised: 10/31/2013 Document Reviewed: 12/02/2012 [facility] Patient Information 2015 [place], [place]. This information is not intended to replace advice given to you by your health care provider. Make sure you discuss any questions you have with your health care provider.  Transient Ischemic Attack A transient ischemic attack (TIA) is a "warning stroke" that causes stroke-like symptoms. A TIA does not cause lasting damage to the brain. It is important to know when to get help and what to do to prevent stroke or death.  HOME CARE   Take all medicines exactly as told by your doctor. Understand all your medicine instructions.  You may need to take aspirin or warfarin medicine. Take warfarin exactly as told.  Taking too much or too little warfarin is dangerous. Blood tests must be done as often as told by your doctor. These blood tests help your doctor make sure the amount of warfarin you are taking is right. A PT blood test measures how long it takes for blood to clot. Your PT is used to calculate another value called an INR. Your PT and INR help your doctor adjust your warfarin dosage.  Food can cause problems with warfarin and affect the results of your blood tests. This is true for foods high in vitamin K. Spinach, kale, broccoli, cabbage, collard and turnip greens, Brussels sprouts, peas, cauliflower, seaweed, and parsley are high in vitamin K as well as beef and pork liver, green tea, and soybean oil. Eat the same amount of food high in vitamin K. Avoid major changes in your diet. Tell your doctor before changing your diet. Talk to a food specialist (dietitian) if you have questions.  Many medicines can cause problems with warfarin and affect your PT and INR. Tell your doctor about all medicines you take. This  includes vitamins and dietary pills (supplements). Be careful with aspirin and medicines that relieve redness, soreness, and puffiness (inflammation). Do not take or stop medicines unless your doctor tells you to.  Warfarin can cause a lot of bruising or bleeding.  Hold pressure over cuts for longer than normal. Talk to your doctor about other side effects of warfarin.  Avoid sports or activities that may cause injury or bleeding.  Be careful when you shave, floss your teeth, or use sharp objects.  Avoid alcoholic drinks or drink very little alcohol while taking warfarin. Tell your doctor if you change how much alcohol you drink.  Tell your dentist and other doctors that you take warfarin before procedures.  Eat 5 or more servings of fruits and vegetables a day.  Follow your diet program as told, if you are given one.  Keep a healthy weight.  Stay active. Try to get at least 30 minutes of activity on most or all days.  Do not smoke.  Limit how much alcohol you drink even if you are not taking warfarin. Moderate alcohol use is:  No more than 2 drinks each day for men.  No more than 1 drink each day for women who are not pregnant.  Stop abusing drugs.  Keep your home safe so you do not fall. Try:  Putting grab bars in the bedroom and bathroom.  Raising toilet seats.  Putting a seat in the shower.  Keep all doctor visits a told. GET HELP IF:  Your personality changes.  You have trouble swallowing.  You are seeing two of everything.  You are dizzy.  You have a fever.  Your skin starts to break down. GET HELP RIGHT AWAY IF:  The symptoms below may be a sign of an emergency. Do not wait to see if the symptoms go away. Call for help (911 in [place] Do not drive yourself to the hospital.  You have sudden weakness or numbness on the face, arm, or leg (especially on one side of the body).  You have sudden trouble walking or moving your arms or legs.  You have sudden  confusion.  You have trouble talking or understanding.  You have sudden trouble seeing in one or both eyes.  You lose your balance or your movements are not smooth.  You have a sudden, severe headache with no known cause.  You have new chest pain or you feel your heart beating in a unsteady way.  You are partly or totally unaware of what is going on around you. MAKE SURE YOU:   Understand these instructions.  Will watch your condition.  Will get help right away if you are not doing well or get worse. Document Released: 03/25/2008 Document Revised: 10/31/2013 Document Reviewed: 09/21/2013 [facility] Patient Information 2015 [place], [place]. This information is not intended to replace advice given to you by your health care provider. Make sure you discuss any questions you have with your health care provider.

## 2014-02-03 NOTE — Evaluation (Addendum)
Physical Therapy  Patient Details Name: Eric Tran MRN: [id number] DOB: [birth year] Today's Date: 02/03/2014  Time:  60- 83   History of Present Illness  Eric Tran is a 63 y.o. male presenting with generalized pain and AMS . PMH is significant for multiple CVA, SZR, HTN, Hep C, Anxiety/Depression.  Clinical Impression  Patient denies physical therapy stating he feels like he did before he needed to come to the hospital noting that his strength and mobility are the same. Despite max encouragement from therapist and patient's wife patient denies physical therapy and stating "I am a veteran and will go to the [place] if I want therapy, and right now I don't want therapy while I am at this hospital. I am only here because I was lucid when i cam in and could not have made it to the [place]; the doctor wanted me to stay here over night so here I am. I AM READY TO [place]." Patient states he does not want to be seen by physical therapy during current hospital visit and states independence with bed mobility and ambulation, and will not be seen by physical therapy again during current stay.     Kellee Sittner R 02/03/2014, 8:45 AM

## 2014-02-03 NOTE — Progress Notes (Signed)
Occupational Therapy Screen Date: 02/03/14  PT and OT arrived in patient's room with wife present. Patient refused to participate in PT/OT evaluation stating that disorientation had resolved and he does not want therapy services. Patient refused ambulate or sit on EOB for assessment. Wife states she feels comfortable taking patient home and assisting him as she is a Therapist, sports. No further OT services needed at this time; will sign off.   Ailene Ravel, OTR/L,CBIS

## 2014-02-03 NOTE — Progress Notes (Signed)
02/03/14 1151 Discharge instructions reviewed with patient and wife at bedside. Given copy of AVS, noted when home medications next due on list. Given prescriptions, discussed follow-up appointments. Pt states he has appointments scheduled with PCP and neurologist. Discussed smoking cessation education sheet. Discussed when to seek medical attention, s/s TIA, provided education sheet. Wife stated you're getting disoriented, I think he's going to have another attack". States patient has seen neurologist several times for "attacks". Offered to have Dr. Dyann Kief come to see patient prior to discharge, pt stated "I'm okay, I want to go home". Wife stated no need for MD to come, pt okay for discharge. Notified patient's nurse, C. Toney Rakes, Therapist, sports. Pt seen by his nurse and MD again prior to discharge. Neuro status at baseline, unchanged since this am per his nurse. Pt left floor in stable condition, via w/c accompanied by nurse tech. Wife also requested copy of MRI results. Discussed with them they can complete release of information form and request from medical records per privacy policy. Stated okay. Janeece Fitting, RN

## 2014-02-03 NOTE — Discharge Summary (Signed)
Physician Discharge Summary  [name] DVV:[id number] DOB: [birth year] DOA: 02/02/2014  PCP: Raquel James  Admit date: 02/02/2014 Discharge date: 02/03/2014  Time spent: >30 minutes  Recommendations for Outpatient Follow-up:  Reassess BP and adjust antihypertensive regimen  Discharge Diagnoses:  Active Problems:   TIA (transient ischemic attack) HTN Chronic pain Seizure disorder BPH Tobacco abuse Hyperthyroidism   Discharge Condition: stable and improved. Patient discharge home and will follow with PCP and neurologist as an outpatient.  Diet recommendation: low sodium diet  Filed Weights   02/02/14 1217 02/02/14 1846  Weight: 63.504 kg (140 lb) 63.504 kg (140 lb)    History of present illness:  Eric Tran is a 63 y.o. male presenting with general aches and pains throughout whole body. Pt endorses he over did it yesterday by working out in the yard much more than typical. On the morning of admission, pts wife reports that he was disoriented and weak in general. No localized deficits. Denies HA, fevers, n/v, CP, palpitations, SOB, loss of bowel or bladder function. Last Seizure reported on June 18th.   Hospital Course:  AMS/TIA: Resolved. Likely related to post ictal state from presumed seizure vs TIA -no recent changes on medication -patient with neg CT for stroke and no neurologic deficits -symptoms improved with hydration -will discharge on ASA 81mg  for potentiality of TIA -will recommend follow up with neurology for adjustments on his seizure medications -follow up with PCP in 1 week  Generalized aches: possibly MSK. Patient had hx of falling out of ladder and since then has always been on pain -ran out of home narcotic regimen -will provide short amount prescription until he follows with PCP  Szrs: last witnessed seizure June 18th. Pt may have a seizure last night that was unwitnessed. Pt has seized in his sleep per pts wife reports in the past. Only on Gabapentin  for mgt.  -patient will follow with neurology on Tuesday 02/07/14  H/o Stroke: Multiple strokes in the past w/ L sided deficits. No new deficits on presentation  - PT/OT refused by patient stating he do not feel any deficit -will discharge on ASA  HTN: Hypertensive on admission  - continue lisinopril, Norvase, Propranolol.  -advised to follow low sodium diet  BPH: no obstructive symptoms on admission  - continue flomax   Hyperthyroidism:  - continue tapazone   Tobacco abuse: counseled on quitting and pt would like to quit  - Nicotine patch 14mg  Qday (0.5ppd smoker) used during admission    Procedures: See below for x-ray reports   Consultations:  None   Discharge Exam: Filed Vitals:   02/03/14 0622  BP: 156/110  Pulse: 63  Temp: 97.9 F (36.6 C)  Resp: 18    General: Alert, awake and oriented x3; patient denies any acute distress or complaints Cardiovascular: S1-S2, regular rate and rhythm; no rubs or gallops Respiratory: Clear to auscultation bilaterally, good air movement; 95% oxygen saturation on room air Abdomen: Soft, nontender, nondistended, positive bowel sounds Extremities: No edema, no cyanosis or clubbing Neurologic exam: No new focal motor or neurologic deficit on exam; patient able to follow commands properly and answer questions intelligible. Had left sided deficit from previous strokes (mainly numbness sensation)  Discharge Instructions You were cared for by a hospitalist during your hospital stay. If you have any questions about your discharge medications or the care you received while you were in the hospital after you are discharged, you can call the unit and asked to speak with the  hospitalist on call if the hospitalist that took care of you is not available. Once you are discharged, your primary care physician will handle any further medical issues. Please note that NO REFILLS for any discharge medications will be authorized once you are discharged, as  it is imperative that you return to your primary care physician (or establish a relationship with a primary care physician if you do not have one) for your aftercare needs so that they can reassess your need for medications and monitor your lab values.  Discharge Instructions   Discharge instructions    Complete by:  As directed   Take medications as prescribed Follow with PCP and neurology at discharge Keep yourself well hydrated Please stop smoking            Medication List         ALPRAZolam 0.5 MG tablet  Commonly known as:  XANAX  Take 0.5 mg by mouth 3 (three) times daily as needed. For sleep and anxiety     amLODipine 10 MG tablet  Commonly known as:  NORVASC  Take 5 mg by mouth daily.     aspirin EC 81 MG tablet  Take 1 tablet (81 mg total) by mouth daily.     donepezil 10 MG tablet  Commonly known as:  ARICEPT  Take 10 mg by mouth at bedtime.     gabapentin 600 MG tablet  Commonly known as:  NEURONTIN  Take 600 mg by mouth 3 (three) times daily.     GLUCOSAMINE CHONDR COMPLEX PO  Take 2 tablets by mouth daily.     lisinopril 20 MG tablet  Commonly known as:  PRINIVIL,ZESTRIL  Take 20 mg by mouth daily.     methimazole 10 MG tablet  Commonly known as:  TAPAZOLE  Take 10 mg by mouth daily.     multivitamin with minerals Tabs tablet  Take 1 tablet by mouth daily.     oxyCODONE-acetaminophen 10-325 MG per tablet  Commonly known as:  PERCOCET  Take 1 tablet by mouth every 6 (six) hours as needed for pain.     propranolol ER 80 MG 24 hr capsule  Commonly known as:  INDERAL LA  Take 80 mg by mouth daily.     tamsulosin 0.4 MG Caps capsule  Commonly known as:  FLOMAX  Take 0.4 mg by mouth daily.     traZODone 50 MG tablet  Commonly known as:  DESYREL  Take 50 mg by mouth at bedtime.     venlafaxine XR 75 MG 24 hr capsule  Commonly known as:  EFFEXOR-XR  Take 75 mg by mouth daily with breakfast.       Allergies  Allergen Reactions  . Morphine  And Related Nausea And Vomiting    Can only take through IV       Follow-up Information   Follow up with Heyat, Perviz In 1 week. (Call office to schedule appointment)    Specialty:  Internal Medicine   Contact information:   [address] [place] [address] [phone]       The results of significant diagnostics from this hospitalization (including imaging, microbiology, ancillary and laboratory) are listed below for reference.    Significant Diagnostic Studies: Dg Chest 2 View  02/02/2014   CLINICAL DATA:  Weakness, smoker  EXAM: CHEST  2 VIEW  COMPARISON:  01/12/2014  FINDINGS: Stable hyperinflation suggesting COPD/ emphysema. Normal heart size and vascularity. Lungs remain clear. No focal pneumonia, edema, collapse or  consolidation. No significant effusion or pneumothorax. Trachea midline. Atherosclerosis of the aorta.  IMPRESSION: Stable COPD/emphysema with hyperinflation. No superimposed acute process   Electronically Signed   By: Daryll Brod M.D.   On: 02/02/2014 13:53   Dg Chest 2 View  01/12/2014   CLINICAL DATA:  Increasing weakness and back pain.  Smoker.  EXAM: CHEST  2 VIEW  COMPARISON:  Chest radiograph December 08, 2013  FINDINGS: Cardiac silhouette remains unremarkable. Mildly calcified aortic knob, mediastinal silhouette is nonsuspicious. No pleural effusions or focal consolidations. Increased lung volumes with flattened hemidiaphragms. No pneumothorax.  ACDF. Remote left posterior rib fractures. Mild degenerative change of thoracic spine.  IMPRESSION: COPD without superimposed acute cardiopulmonary process.   Electronically Signed   By: Elon Alas   On: 01/12/2014 06:12   Dg Lumbar Spine Complete  01/05/2014   CLINICAL DATA:  Low back pain for 3 weeks after falling from a step ladder.  EXAM: LUMBAR SPINE - COMPLETE 4+ VIEW  COMPARISON:  CT abdomen and pelvis 07/27/2012  FINDINGS: Mild degenerative endplate hypertrophic changes in the lumbar spine. There is no  evidence of lumbar spine fracture. Alignment is normal. Intervertebral disc spaces are maintained. Vascular calcifications.  IMPRESSION: Degenerative changes in the lumbar spine. No acute bony abnormalities appreciated.   Electronically Signed   By: Lucienne Capers M.D.   On: 01/05/2014 00:51   Ct Head Wo Contrast  02/02/2014   CLINICAL DATA:  Difficulty walking for 1 day.  History of stroke.  EXAM: CT HEAD WITHOUT CONTRAST  TECHNIQUE: Contiguous axial images were obtained from the base of the skull through the vertex without contrast.  COMPARISON:  07/24/2013.  FINDINGS: Premature for age cerebral and cerebellar atrophy. Hypoattenuation of the white matter consistent with chronic microvascular ischemic change. Remote periventricular and thalamic lacunar infarcts are stable.  No evidence for acute infarction, hemorrhage, mass lesion, hydrocephalus, or extra-axial fluid.  Calvarium intact.  No sinus or mastoid disease.  IMPRESSION: No acute intracranial abnormality.  Stable exam.   Electronically Signed   By: Rolla Flatten M.D.   On: 02/02/2014 16:14   Mr Lumbar Spine Wo Contrast  01/12/2014   CLINICAL DATA:  Low back pain over the past 6 weeks with multiple falls.  EXAM: MRI LUMBAR SPINE WITHOUT CONTRAST  TECHNIQUE: Multiplanar, multisequence MR imaging of the lumbar spine was performed. No intravenous contrast was administered.  COMPARISON:  Lumbar spine radiographs 01/05/2014. CT of the abdomen and pelvis was performed earlier today  FINDINGS: The alignment is anatomic except for trace retrolisthesis L3-L4. The conus is normal. There is no lumbar compression deformity. Slight marrow heterogeneity without worrisome osseous lesion. Intervertebral disc heights are essentially normal for age. Chronic saccular aneurysm described on CT abdomen better characterized on that examination. Chronic LEFT renal scarring.  The individual disc spaces were examined with axial images as follows:  L1-L2:  Normal.  L2-L3: There  is mild annular bulging centrally. There is a foraminal protrusion on the RIGHT extending to the inferior foramen. Anterior protrusion into the retroperitoneum could be associated with lumbago. There is mild facet and ligamentum flavum hypertrophy without impingement.  L3-L4: Trace retrolisthesis. Shallow central protrusion posteriorly. Anterior protrusion into the retroperitoneum could be associated with lumbago. Moderate facet and ligamentum flavum hypertrophy. Mild central canal stenosis. Either L4 nerve root could be affected.  L4-L5: Mild bulge. Mild facet and ligamentum flavum hypertrophy. Annular rent most notably on the RIGHT, with mild annular T2 bright signal on the LEFT. The RIGHT  neural foramen is narrowed, and RIGHT L4 nerve root impingement is possible.  L5-S1:  Mild bulge.  Mild facet arthropathy.  No impingement.  IMPRESSION: Modest lumbar intervertebral disc disease as described. Correlate clinically for radicular symptoms; see comments above.  No lumbar compression deformity or worrisome osseous lesion.   Electronically Signed   By: Rolla Flatten M.D.   On: 01/12/2014 08:26   Ct Abdomen Pelvis W Contrast  01/12/2014   CLINICAL DATA:  Abdominal and back pain  EXAM: CT ABDOMEN AND PELVIS WITH CONTRAST  TECHNIQUE: Multidetector CT imaging of the abdomen and pelvis was performed using the standard protocol following bolus administration of intravenous contrast.  CONTRAST:  73mL OMNIPAQUE IOHEXOL 300 MG/ML SOLN, 138mL OMNIPAQUE IOHEXOL 300 MG/ML SOLN  COMPARISON:  07/27/2012  FINDINGS: BODY WALL: Unremarkable.  LOWER CHEST: Unremarkable.  ABDOMEN/PELVIS:  Liver: No focal abnormality.  Biliary: No evidence of biliary obstruction or stone.  Pancreas: Unremarkable.  Spleen: Unremarkable.  Adrenals: Unremarkable.  Kidneys and ureters: The right kidney is asymmetrically small and lobulated, secondary to pyelonephritis seen 07/27/2012. No hydronephrosis or nephrolithiasis.  Bladder: Unremarkable.   Reproductive: Unremarkable.  Bowel: No obstruction. Normal appendix.  Retroperitoneum: No mass or adenopathy.  Peritoneum: No ascites or pneumoperitoneum.  Vascular: There is a conical outpouching from the anterior and left aspect of the descending thoracic aorta, just above the hiatus. The abnormality measures approximately 16 mm in depth and has a wide neck at 2.6 cm. The neck appears wider compared to previous, but there is no increasing depth of the aneurysm. No evidence of leakage. This could be atherosclerotic or posttraumatic in etiology.  OSSEOUS: There is spondylotic endplate spurring and degenerative disc bulging in the lower lumbar spine, but no acute findings to explain back pain.  IMPRESSION: 1. No acute intra-abdominal findings. 2. Marked right renal scarring after pyelonephritis in 2014. 3. Chronic 16 mm saccular aneurysm from the distal thoracic aorta.   Electronically Signed   By: Jorje Guild M.D.   On: 01/12/2014 06:29   Labs: Basic Metabolic Panel:  Recent Labs Lab 02/02/14 1241 02/03/14 0520  NA 139 140  K 3.7 4.0  CL 102 106  CO2 26 23  GLUCOSE 148* 76  BUN 21 17  CREATININE 0.64 0.66  CALCIUM 8.9 8.4   Liver Function Tests:  Recent Labs Lab 02/02/14 1241 02/03/14 0520  AST 33 26  ALT 77* 54*  ALKPHOS 65 54  BILITOT 0.6 0.6  PROT 7.2 6.0  ALBUMIN 3.8 3.2*    Recent Labs Lab 02/02/14 1622  AMMONIA 46   CBC:  Recent Labs Lab 02/02/14 1241  WBC 6.3  NEUTROABS 3.6  HGB 15.9  HCT 43.8  MCV 89.2  PLT 178   Cardiac Enzymes:  Recent Labs Lab 02/02/14 1241  TROPONINI <0.30   BNP: BNP (last 3 results)  Recent Labs  03/31/13 1420  PROBNP 192.4*   CBG:  Recent Labs Lab 02/02/14 1246  GLUCAP 133*    Signed:  Barton Dubois  [facility] 02/03/2014, 10:56 AM

## 2014-02-03 NOTE — Progress Notes (Signed)
8/7/158 1138 Patient states "I don't have any more xanax", listed as home medication on discharge instructions. Text-paged Dr. Dyann Kief to notify. Janeece Fitting, RN

## 2014-07-23 ENCOUNTER — Encounter (HOSPITAL_COMMUNITY): Payer: Self-pay | Admitting: *Deleted

## 2014-07-23 ENCOUNTER — Emergency Department (HOSPITAL_COMMUNITY)
Admission: EM | Admit: 2014-07-23 | Discharge: 2014-07-24 | Disposition: A | Discharge status: Home / Self Care | Payer: Non-veteran care | Attending: Emergency Medicine | Admitting: Emergency Medicine

## 2014-07-23 DIAGNOSIS — G40909 Epilepsy, unspecified, not intractable, without status epilepticus: Secondary | ICD-10-CM | POA: Diagnosis not present

## 2014-07-23 DIAGNOSIS — Z720 Tobacco use: Secondary | ICD-10-CM | POA: Insufficient documentation

## 2014-07-23 DIAGNOSIS — F419 Anxiety disorder, unspecified: Secondary | ICD-10-CM | POA: Diagnosis not present

## 2014-07-23 DIAGNOSIS — Z8619 Personal history of other infectious and parasitic diseases: Secondary | ICD-10-CM | POA: Diagnosis not present

## 2014-07-23 DIAGNOSIS — Z79899 Other long term (current) drug therapy: Secondary | ICD-10-CM | POA: Diagnosis not present

## 2014-07-23 DIAGNOSIS — R011 Cardiac murmur, unspecified: Secondary | ICD-10-CM | POA: Diagnosis not present

## 2014-07-23 DIAGNOSIS — M791 Myalgia, unspecified site: Secondary | ICD-10-CM

## 2014-07-23 DIAGNOSIS — R52 Pain, unspecified: Secondary | ICD-10-CM | POA: Diagnosis present

## 2014-07-23 DIAGNOSIS — Z8673 Personal history of transient ischemic attack (TIA), and cerebral infarction without residual deficits: Secondary | ICD-10-CM | POA: Diagnosis not present

## 2014-07-23 DIAGNOSIS — G8929 Other chronic pain: Secondary | ICD-10-CM | POA: Insufficient documentation

## 2014-07-23 DIAGNOSIS — R0602 Shortness of breath: Secondary | ICD-10-CM

## 2014-07-23 DIAGNOSIS — R5383 Other fatigue: Secondary | ICD-10-CM | POA: Diagnosis not present

## 2014-07-23 DIAGNOSIS — Z7982 Long term (current) use of aspirin: Secondary | ICD-10-CM | POA: Diagnosis not present

## 2014-07-23 DIAGNOSIS — M1990 Unspecified osteoarthritis, unspecified site: Secondary | ICD-10-CM | POA: Insufficient documentation

## 2014-07-23 DIAGNOSIS — R630 Anorexia: Secondary | ICD-10-CM | POA: Diagnosis not present

## 2014-07-23 DIAGNOSIS — I10 Essential (primary) hypertension: Secondary | ICD-10-CM | POA: Insufficient documentation

## 2014-07-23 DIAGNOSIS — R509 Fever, unspecified: Secondary | ICD-10-CM | POA: Diagnosis not present

## 2014-07-23 DIAGNOSIS — Z87440 Personal history of urinary (tract) infections: Secondary | ICD-10-CM | POA: Insufficient documentation

## 2014-07-23 NOTE — ED Notes (Signed)
Pt reports generalized weakness for a little over a month. States hurting all over but a little more on the left side.

## 2014-07-23 NOTE — ED Provider Notes (Signed)
CSN: [id number]     Arrival date & time 07/23/14  2334 History   This chart was scribed for Eric Norrie, MD by Randa Evens, ED Scribe. This patient was seen in room APA19/APA19 and the patient's care was started at 11:57 PM.      Chief Complaint  Patient presents with  . Generalized Body Aches   HPI HPI Comments: Eric Tran is a 64 y.o. male with PMHx of COPD and others listed below who presents to the Emergency Department complaining of generalized weakness in his limbs onset 1 month prior. Pt states that he feels weak on the left side but has a hx of CVA with left sided weakness that is chronic. Wife states that he has associated fatigue, SOB, DOE, subjective fever with night sweats, decreased appetite. Wife states that he become short of breath when walking short distances. Wife states that he was using an albuterol inhaler but has stopped using it because it didn't provide any relief. Wife states that over the past year he has had some unexplained weight loss. Wife states that he has had some black stools as well in the past week. Wife states that his BP was elevated PTA. Wife states that he sees an infectious disease doctor at the [facility] for hepatitis and was last seen on January 5 They are discussing giving him the vaccine, but he had to have his teeth extracted first. He had his remaining 16 teeth extracted in December. Pt also sees an neurologist for his HX of seizures and stroke in the past. Denies cough, CP, abdominal pain or diaphoresis. She notes he had a choking episode last night. She states he has had some weight loss over the past year from his normal weight of 140 down to 120. She states his weight loss was evaluated at the [facility]. She states "they didn't do anything for him". He had a CT of his chest, CT of his abdomen, blood work, and endoscopy, and colonoscopy without source of weight loss found.  Pt states that he is a current smoker.   PCP [facility]  Past  Medical History  Diagnosis Date  . Hyperthyroidism   . Chronic back pain     chronic narcotic use  . Degenerative disc disease   . Carpal tunnel syndrome   . Arthritis   . Hypertension   . Chronic hepatitis C 1980    never had any treatment  . CVA (cerebral infarction) 06/2010  . Anxiety   . Depression   . UTI (lower urinary tract infection)   . Stroke 2012    left hemiplegia  . Seizures     Epilepsy  . Heart murmur   . Head trauma    Past Surgical History  Procedure Laterality Date  . Hand surgery      right  . Neck surgery    . Right hand reconstruction     Family History  Problem Relation Age of Onset  . Colon cancer Neg Hx   . Colon polyps Neg Hx   . Cervical cancer Mother   . Lung cancer Sister   . Breast cancer Sister   . Heart failure Father    History  Substance Use Topics  . Smoking status: Current Every Day Smoker -- 1.00 packs/day for 45 years    Types: Cigarettes  . Smokeless tobacco: Never Used  . Alcohol Use: 1.2 oz/week    2 Cans of beer per week  Comment: Denies today   smokes 1 ppd  lives at home Lives with spouse  Review of Systems  Constitutional: Positive for fever, appetite change and fatigue. Negative for diaphoresis.  Respiratory: Positive for shortness of breath. Negative for cough.   Cardiovascular: Negative for chest pain.  Gastrointestinal: Negative for abdominal pain.  Neurological: Positive for weakness.  All other systems reviewed and are negative.    Allergies  Morphine and related  Home Medications   Prior to Admission medications   Medication Sig Start Date End Date Taking? Authorizing Provider  ALPRAZolam Duanne Moron) 0.5 MG tablet Take 1 tablet (0.5 mg total) by mouth 3 (three) times daily as needed for anxiety. For sleep and anxiety 02/03/14  Yes Barton Dubois, MD  amLODipine (NORVASC) 10 MG tablet Take 5 mg by mouth daily.   Yes Historical Provider, MD  aspirin EC 81 MG tablet Take 1 tablet (81 mg total) by mouth  daily. 02/03/14  Yes Barton Dubois, MD  donepezil (ARICEPT) 10 MG tablet Take 10 mg by mouth at bedtime.   Yes Historical Provider, MD  gabapentin (NEURONTIN) 600 MG tablet Take 600 mg by mouth 3 (three) times daily.   Yes Historical Provider, MD  hydrALAZINE (APRESOLINE) 10 MG tablet Take 10 mg by mouth 2 (two) times daily.   Yes Historical Provider, MD  HYDROcodone-acetaminophen (NORCO/VICODIN) 5-325 MG per tablet Take 1 tablet by mouth 3 (three) times daily.   Yes Historical Provider, MD  lisinopril (PRINIVIL,ZESTRIL) 20 MG tablet Take 40 mg by mouth daily.    Yes Historical Provider, MD  methimazole (TAPAZOLE) 10 MG tablet Take 10 mg by mouth daily.   Yes Historical Provider, MD  Multiple Vitamin (MULTIVITAMIN WITH MINERALS) TABS tablet Take 1 tablet by mouth daily.   Yes Historical Provider, MD  propranolol ER (INDERAL LA) 80 MG 24 hr capsule Take 80 mg by mouth daily.   Yes Historical Provider, MD  Tamsulosin HCl (FLOMAX) 0.4 MG CAPS Take 0.4 mg by mouth daily.    Yes Historical Provider, MD  traZODone (DESYREL) 50 MG tablet Take 50 mg by mouth at bedtime.   Yes Historical Provider, MD  venlafaxine XR (EFFEXOR-XR) 75 MG 24 hr capsule Take 75 mg by mouth daily with breakfast.   Yes Historical Provider, MD  Glucosamine-Chondroitin (GLUCOSAMINE CHONDR COMPLEX PO) Take 2 tablets by mouth daily.    Historical Provider, MD  oxyCODONE-acetaminophen (PERCOCET) 10-325 MG per tablet Take 1 tablet by mouth every 6 (six) hours as needed for pain. 02/03/14   Barton Dubois, MD   BP 180/107 mmHg  Pulse 68  Temp(Src) 97.5 F (36.4 C) (Oral)  Resp 20  Ht 5\' 8"  (1.727 m)  Wt 140 lb (63.504 kg)  BMI 21.29 kg/m2  SpO2 98%  Vital signs normal except hypertension  00:33 Orthostatic Vital Signs SJ  Orthostatic Lying  - BP- Lying: 165/94 mmHg ; Pulse- Lying: 60  Orthostatic Sitting - BP- Sitting: 164/95 mmHg ; Pulse- Sitting: 67  Orthostatic Standing at 0 minutes - BP- Standing at 0 minutes: 142/93 mmHg ;  Pulse- Standing at 0 minutes: 73   Orthostatics negative except for HTN    Physical Exam  Constitutional: He is oriented to person, place, and time.  Non-toxic appearance. He does not appear ill. No distress.  Frail male that appears older than stated age.   HENT:  Head: Normocephalic and atraumatic.  Right Ear: External ear normal.  Left Ear: External ear normal.  Nose: Nose normal. No mucosal edema or  rhinorrhea.  Mouth/Throat: Mucous membranes are normal. No dental abscesses or uvula swelling.  Dry mucous membranes,  Edentulous.   Eyes: Conjunctivae and EOM are normal. Pupils are equal, round, and reactive to light.  Neck: Normal range of motion and full passive range of motion without pain. Neck supple.  Cardiovascular: Normal rate and regular rhythm.  Exam reveals no gallop and no friction rub.   Murmur heard.  Systolic murmur is present  Faint systolic murmur heard best right upper sternal border.   Pulmonary/Chest: Effort normal and breath sounds normal. No respiratory distress. He has no wheezes. He has no rhonchi. He has no rales. He exhibits no tenderness and no crepitus.  Abdominal: Soft. Normal appearance and bowel sounds are normal. He exhibits no distension. There is no tenderness. There is no rebound and no guarding.  Musculoskeletal: Normal range of motion. He exhibits no edema or tenderness.  Moves all extremities well.   Neurological: He is alert and oriented to person, place, and time. He has normal strength. No cranial nerve deficit.  Skin: Skin is warm, dry and intact. No rash noted. No erythema. There is pallor.  Psychiatric: He has a normal mood and affect. His speech is normal and behavior is normal. His mood appears not anxious.  Nursing note and vitals reviewed.   ED Course  Procedures (including critical care time)  Medications  0.9 %  sodium chloride infusion ( Intravenous New Bag/Given 07/24/14 0055)  naproxen (NAPROSYN) tablet 250 mg (250 mg Oral Not  Given 07/24/14 0148)    DIAGNOSTIC STUDIES: Oxygen Saturation is 98% on RA, normal by my interpretation.    COORDINATION OF CARE: 12:12 AM-Discussed treatment plan  with pt at bedside and pt agreed to plan.   Patient ambulated by nursing staff and his pulse ox remained 96-98% on room air with a heart rate of 64-76.  Patient requesting medication for his diffuse body aches. Naproxen was ordered but refused by patient.   At time of discharge I gave wife and patient his test results. He is quietly watching TV in NAD. Wife states she was a [employer] scientist, she was given hemoccult card x 3 and gloves to check his stools if they become black again. They deny pepto bismol or any food that would make his stool black.   Labs Review Results for orders placed or performed during the hospital encounter of 07/23/14  Comprehensive metabolic panel  Result Value Ref Range   Sodium 134 (L) 135 - 145 mmol/L   Potassium 3.5 3.5 - 5.1 mmol/L   Chloride 102 96 - 112 mmol/L   CO2 27 19 - 32 mmol/L   Glucose, Bld 89 70 - 99 mg/dL   BUN 14 6 - 23 mg/dL   Creatinine, Ser 0.85 0.50 - 1.35 mg/dL   Calcium 8.2 (L) 8.4 - 10.5 mg/dL   Total Protein 6.5 6.0 - 8.3 g/dL   Albumin 3.6 3.5 - 5.2 g/dL   AST 49 (H) 0 - 37 U/L   ALT 76 (H) 0 - 53 U/L   Alkaline Phosphatase 57 39 - 117 U/L   Total Bilirubin 0.8 0.3 - 1.2 mg/dL   GFR calc non Af Amer >90 >90 mL/min   GFR calc Af Amer >90 >90 mL/min   Anion gap 5 5 - 15  CBC with Differential  Result Value Ref Range   WBC 5.9 4.0 - 10.5 K/uL   RBC 4.03 (L) 4.22 - 5.81 MIL/uL   Hemoglobin 13.1 13.0 - 17.0  g/dL   HCT 37.9 (L) 39.0 - 52.0 %   MCV 94.0 78.0 - 100.0 fL   MCH 32.5 26.0 - 34.0 pg   MCHC 34.6 30.0 - 36.0 g/dL   RDW 13.4 11.5 - 15.5 %   Platelets 194 150 - 400 K/uL   Neutrophils Relative % 40 (L) 43 - 77 %   Neutro Abs 2.4 1.7 - 7.7 K/uL   Lymphocytes Relative 43 12 - 46 %   Lymphs Abs 2.5 0.7 - 4.0 K/uL   Monocytes Relative 13 (H) 3 - 12 %   Monocytes  Absolute 0.7 0.1 - 1.0 K/uL   Eosinophils Relative 5 0 - 5 %   Eosinophils Absolute 0.3 0.0 - 0.7 K/uL   Basophils Relative 1 0 - 1 %   Basophils Absolute 0.0 0.0 - 0.1 K/uL  Ammonia  Result Value Ref Range   Ammonia 45 (H) 11 - 32 umol/L  Urinalysis, Routine w reflex microscopic  Result Value Ref Range   Color, Urine YELLOW YELLOW   APPearance CLEAR CLEAR   Specific Gravity, Urine 1.010 1.005 - 1.030   pH 6.0 5.0 - 8.0   Glucose, UA NEGATIVE NEGATIVE mg/dL   Hgb urine dipstick NEGATIVE NEGATIVE   Bilirubin Urine NEGATIVE NEGATIVE   Ketones, ur NEGATIVE NEGATIVE mg/dL   Protein, ur NEGATIVE NEGATIVE mg/dL   Urobilinogen, UA 0.2 0.0 - 1.0 mg/dL   Nitrite NEGATIVE NEGATIVE   Leukocytes, UA NEGATIVE NEGATIVE  Brain natriuretic peptide  Result Value Ref Range   B Natriuretic Peptide 55.0 0.0 - 100.0 pg/mL  Troponin I  Result Value Ref Range   Troponin I <0.03 <0.031 ng/mL  POC occult blood, ED RN will collect  Result Value Ref Range   Fecal Occult Bld NEGATIVE NEGATIVE   Laboratory interpretation all normal except mildly elevated ammonia which is almost identical to a prior ammonia level     Imaging Review Dg Chest 2 View  07/24/2014   CLINICAL DATA:  Generalized weakness for a month. Left side greater than right. Shortness of breath.  EXAM: CHEST  2 VIEW  COMPARISON:  03/08/2014  FINDINGS: Hyperinflation suggesting emphysema. The heart size and mediastinal contours are within normal limits. Both lungs are clear. The visualized skeletal structures are unremarkable.  IMPRESSION: No active cardiopulmonary disease.   Electronically Signed   By: Lucienne Capers M.D.   On: 07/24/2014 01:11     EKG Interpretation None        Date: 07/24/2014  Rate: 63  Rhythm: normal sinus rhythm  QRS Axis: right  Intervals: normal  ST/T Wave abnormalities: nonspecific ST/T changes  Conduction Disutrbances:LVH, small Q waves in inf leads  Narrative Interpretation:   Old EKG Reviewed:  unchanged from 01 Feb 2014   MDM   Final diagnoses:  Shortness of breath  Other fatigue  Myalgia     Rolland Porter, MD, FACEP   I personally performed the services described in this documentation, which was scribed in my presence. The recorded information has been reviewed and considered.  Rolland Porter, MD, FACEP   Eric Norrie, MD 07/24/14 [phone]

## 2014-07-24 ENCOUNTER — Emergency Department (HOSPITAL_COMMUNITY): Payer: Non-veteran care

## 2014-07-24 LAB — CBC WITH DIFFERENTIAL/PLATELET
Basophils Absolute: 0 10*3/uL (ref 0.0–0.1)
Basophils Relative: 1 % (ref 0–1)
Eosinophils Absolute: 0.3 10*3/uL (ref 0.0–0.7)
Eosinophils Relative: 5 % (ref 0–5)
HCT: 37.9 % — ABNORMAL LOW (ref 39.0–52.0)
Hemoglobin: 13.1 g/dL (ref 13.0–17.0)
Lymphocytes Relative: 43 % (ref 12–46)
Lymphs Abs: 2.5 10*3/uL (ref 0.7–4.0)
MCH: 32.5 pg (ref 26.0–34.0)
MCHC: 34.6 g/dL (ref 30.0–36.0)
MCV: 94 fL (ref 78.0–100.0)
Monocytes Absolute: 0.7 10*3/uL (ref 0.1–1.0)
Monocytes Relative: 13 % — ABNORMAL HIGH (ref 3–12)
Neutro Abs: 2.4 10*3/uL (ref 1.7–7.7)
Neutrophils Relative %: 40 % — ABNORMAL LOW (ref 43–77)
Platelets: 194 10*3/uL (ref 150–400)
RBC: 4.03 MIL/uL — ABNORMAL LOW (ref 4.22–5.81)
RDW: 13.4 % (ref 11.5–15.5)
WBC: 5.9 10*3/uL (ref 4.0–10.5)

## 2014-07-24 LAB — COMPREHENSIVE METABOLIC PANEL
ALT: 76 U/L — ABNORMAL HIGH (ref 0–53)
AST: 49 U/L — ABNORMAL HIGH (ref 0–37)
Albumin: 3.6 g/dL (ref 3.5–5.2)
Alkaline Phosphatase: 57 U/L (ref 39–117)
Anion gap: 5 (ref 5–15)
BUN: 14 mg/dL (ref 6–23)
CO2: 27 mmol/L (ref 19–32)
Calcium: 8.2 mg/dL — ABNORMAL LOW (ref 8.4–10.5)
Chloride: 102 mmol/L (ref 96–112)
Creatinine, Ser: 0.85 mg/dL (ref 0.50–1.35)
GFR calc Af Amer: >90 mL/min (ref >90)
GFR calc non Af Amer: >90 mL/min (ref >90)
Glucose, Bld: 89 mg/dL (ref 70–99)
Potassium: 3.5 mmol/L (ref 3.5–5.1)
Sodium: 134 mmol/L — ABNORMAL LOW (ref 135–145)
Total Bilirubin: 0.8 mg/dL (ref 0.3–1.2)
Total Protein: 6.5 g/dL (ref 6.0–8.3)

## 2014-07-24 LAB — URINALYSIS, ROUTINE W REFLEX MICROSCOPIC
Bilirubin Urine: NEGATIVE
Glucose, UA: NEGATIVE mg/dL
Hgb urine dipstick: NEGATIVE
Ketones, ur: NEGATIVE mg/dL
Leukocytes, UA: NEGATIVE
Nitrite: NEGATIVE
Protein, ur: NEGATIVE mg/dL
Specific Gravity, Urine: 1.01 (ref 1.005–1.030)
Urobilinogen, UA: 0.2 mg/dL (ref 0.0–1.0)
pH: 6 (ref 5.0–8.0)

## 2014-07-24 LAB — BRAIN NATRIURETIC PEPTIDE: B Natriuretic Peptide: 55 pg/mL (ref 0.0–100.0)

## 2014-07-24 LAB — TROPONIN I: Troponin I: <0.03 ng/mL (ref <0.031)

## 2014-07-24 LAB — AMMONIA: Ammonia: 45 umol/L — ABNORMAL HIGH (ref 11–32)

## 2014-07-24 LAB — POC OCCULT BLOOD, ED: Fecal Occult Bld: NEGATIVE

## 2014-07-24 MED ORDER — NAPROXEN 250 MG PO TABS
250.0000 mg | ORAL_TABLET | Freq: Once | ORAL | Status: DC
  Filled 2014-07-24: qty 1

## 2014-07-24 MED ORDER — SODIUM CHLORIDE 0.9 % IV SOLN
INTRAVENOUS | Status: DC
  Administered 2014-07-24: 01:00:00 via INTRAVENOUS

## 2014-07-24 NOTE — ED Notes (Signed)
Patient states he takes blood pressure medication at home.

## 2014-07-24 NOTE — ED Notes (Signed)
Went to patients room to provide ordered pain medication. Patient refused pain medication stating "thats like taking nothing, i dont want it" verified with patient that he did not want pain medication and patient repeated that he did not want it. Patient resting in bed family member at bedside.

## 2014-07-24 NOTE — Discharge Instructions (Signed)
Follow up at the [facility] hospital to finish his evaluation of his fatigue. Monitor him for a fever. I gave you a couple of cards to put sample of his stools if he has more black stools. Return to the ED if he gets a fever, struggles to breathe or seems worse.    Fatigue Fatigue is a feeling of tiredness, lack of energy, lack of motivation, or feeling tired all the time. Having enough rest, good nutrition, and reducing stress will normally reduce fatigue. Consult your caregiver if it persists. The nature of your fatigue will help your caregiver to find out its cause. The treatment is based on the cause.  CAUSES  There are many causes for fatigue. Most of the time, fatigue can be traced to one or more of your habits or routines. Most causes fit into one or more of three general areas. They are: Lifestyle problems  Sleep disturbances.  Overwork.  Physical exertion.  Unhealthy habits.  Poor eating habits or eating disorders.  Alcohol and/or drug use .  Lack of proper nutrition (malnutrition). Psychological problems  Stress and/or anxiety problems.  Depression.  Grief.  Boredom. Medical Problems or Conditions  Anemia.  Pregnancy.  Thyroid gland problems.  Recovery from major surgery.  Continuous pain.  Emphysema or asthma that is not well controlled  Allergic conditions.  Diabetes.  Infections (such as mononucleosis).  Obesity.  Sleep disorders, such as sleep apnea.  Heart failure or other heart-related problems.  Cancer.  Kidney disease.  Liver disease.  Effects of certain medicines such as antihistamines, cough and cold remedies, prescription pain medicines, heart and blood pressure medicines, drugs used for treatment of cancer, and some antidepressants. SYMPTOMS  The symptoms of fatigue include:   Lack of energy.  Lack of drive (motivation).  Drowsiness.  Feeling of indifference to the surroundings. DIAGNOSIS  The details of how you feel help guide  your caregiver in finding out what is causing the fatigue. You will be asked about your present and past health condition. It is important to review all medicines that you take, including prescription and non-prescription items. A thorough exam will be done. You will be questioned about your feelings, habits, and normal lifestyle. Your caregiver may suggest blood tests, urine tests, or other tests to look for common medical causes of fatigue.  TREATMENT  Fatigue is treated by correcting the underlying cause. For example, if you have continuous pain or depression, treating these causes will improve how you feel. Similarly, adjusting the dose of certain medicines will help in reducing fatigue.  HOME CARE INSTRUCTIONS   Try to get the required amount of good sleep every night.  Eat a healthy and nutritious diet, and drink enough water throughout the day.  Practice ways of relaxing (including yoga or meditation).  Exercise regularly.  Make plans to change situations that cause stress. Act on those plans so that stresses decrease over time. Keep your work and personal routine reasonable.  Avoid street drugs and minimize use of alcohol.  Start taking a daily multivitamin after consulting your caregiver. SEEK MEDICAL CARE IF:   You have persistent tiredness, which cannot be accounted for.  You have fever.  You have unintentional weight loss.  You have headaches.  You have disturbed sleep throughout the night.  You are feeling sad.  You have constipation.  You have dry skin.  You have gained weight.  You are taking any new or different medicines that you suspect are causing fatigue.  You are  unable to sleep at night.  You develop any unusual swelling of your legs or other parts of your body. SEEK IMMEDIATE MEDICAL CARE IF:   You are feeling confused.  Your vision is blurred.  You feel faint or pass out.  You develop severe headache.  You develop severe abdominal, pelvic,  or back pain.  You develop chest pain, shortness of breath, or an irregular or fast heartbeat.  You are unable to pass a normal amount of urine.  You develop abnormal bleeding such as bleeding from the rectum or you vomit blood.  You have thoughts about harming yourself or committing suicide.  You are worried that you might harm someone else. MAKE SURE YOU:   Understand these instructions.  Will watch your condition.  Will get help right away if you are not doing well or get worse. Document Released: 04/13/2007 Document Revised: 09/08/2011 Document Reviewed: 10/18/2013 [facility] Patient Information 2015 [place], [place]. This information is not intended to replace advice given to you by your health care provider. Make sure you discuss any questions you have with your health care provider.

## 2014-07-24 NOTE — ED Notes (Signed)
Patient verbalizes understanding of discharge instructions, home care, and follow up care. Patient ambulatory out of department at this time with spouse.

## 2014-07-24 NOTE — ED Notes (Signed)
Patient ambulated with assist, tolerated well. Oxygen stayed at 96-98% HR 64-76

## 2014-07-24 NOTE — ED Notes (Signed)
Patient states "not feeling well for a month" patient states he has had general body weakness with some pain in his chest and left side.

## 2014-07-24 NOTE — ED Notes (Signed)
Patient ambulatory to restroom  ?

## 2014-10-03 ENCOUNTER — Encounter: Payer: Self-pay | Admitting: Neurology

## 2014-10-03 ENCOUNTER — Ambulatory Visit (INDEPENDENT_AMBULATORY_CARE_PROVIDER_SITE_OTHER): Payer: Medicaid Other | Admitting: Neurology

## 2014-10-03 VITALS — BP 122/78 | HR 98 | Ht 68.0 in | Wt 137.8 lb

## 2014-10-03 DIAGNOSIS — R413 Other amnesia: Secondary | ICD-10-CM

## 2014-10-03 DIAGNOSIS — I679 Cerebrovascular disease, unspecified: Secondary | ICD-10-CM

## 2014-10-03 DIAGNOSIS — G40209 Localization-related (focal) (partial) symptomatic epilepsy and epileptic syndromes with complex partial seizures, not intractable, without status epilepticus: Secondary | ICD-10-CM | POA: Diagnosis not present

## 2014-10-03 DIAGNOSIS — R269 Unspecified abnormalities of gait and mobility: Secondary | ICD-10-CM | POA: Insufficient documentation

## 2014-10-03 HISTORY — DX: Cerebrovascular disease, unspecified: I67.9

## 2014-10-03 HISTORY — DX: Other amnesia: R41.3

## 2014-10-03 HISTORY — DX: Localization-related (focal) (partial) symptomatic epilepsy and epileptic syndromes with complex partial seizures, not intractable, without status epilepticus: G40.209

## 2014-10-03 HISTORY — DX: Unspecified abnormalities of gait and mobility: R26.9

## 2014-10-03 NOTE — Patient Instructions (Signed)
Stroke Prevention Some medical conditions and behaviors are associated with an increased chance of having a stroke. You may prevent a stroke by making healthy choices and managing medical conditions. HOW CAN I REDUCE MY RISK OF HAVING A STROKE?   Stay physically active. Get at least 30 minutes of activity on most or all days.  Do not smoke. It may also be helpful to avoid exposure to secondhand smoke.  Limit alcohol use. Moderate alcohol use is considered to be:  No more than 2 drinks per day for men.  No more than 1 drink per day for nonpregnant women.  Eat healthy foods. This involves:  Eating 5 or more servings of fruits and vegetables a day.  Making dietary changes that address high blood pressure (hypertension), high cholesterol, diabetes, or obesity.  Manage your cholesterol levels.  Making food choices that are high in fiber and low in saturated fat, trans fat, and cholesterol may control cholesterol levels.  Take any prescribed medicines to control cholesterol as directed by your health care provider.  Manage your diabetes.  Controlling your carbohydrate and sugar intake is recommended to manage diabetes.  Take any prescribed medicines to control diabetes as directed by your health care provider.  Control your hypertension.  Making food choices that are low in salt (sodium), saturated fat, trans fat, and cholesterol is recommended to manage hypertension.  Take any prescribed medicines to control hypertension as directed by your health care provider.  Maintain a healthy weight.  Reducing calorie intake and making food choices that are low in sodium, saturated fat, trans fat, and cholesterol are recommended to manage weight.  Stop drug abuse.  Avoid taking birth control pills.  Talk to your health care provider about the risks of taking birth control pills if you are over 35 years old, smoke, get migraines, or have ever had a blood clot.  Get evaluated for sleep  disorders (sleep apnea).  Talk to your health care provider about getting a sleep evaluation if you snore a lot or have excessive sleepiness.  Take medicines only as directed by your health care provider.  For some people, aspirin or blood thinners (anticoagulants) are helpful in reducing the risk of forming abnormal blood clots that can lead to stroke. If you have the irregular heart rhythm of atrial fibrillation, you should be on a blood thinner unless there is a good reason you cannot take them.  Understand all your medicine instructions.  Make sure that other conditions (such as anemia or atherosclerosis) are addressed. SEEK IMMEDIATE MEDICAL CARE IF:   You have sudden weakness or numbness of the face, arm, or leg, especially on one side of the body.  Your face or eyelid droops to one side.  You have sudden confusion.  You have trouble speaking (aphasia) or understanding.  You have sudden trouble seeing in one or both eyes.  You have sudden trouble walking.  You have dizziness.  You have a loss of balance or coordination.  You have a sudden, severe headache with no known cause.  You have new chest pain or an irregular heartbeat. Any of these symptoms may represent a serious problem that is an emergency. Do not wait to see if the symptoms will go away. Get medical help at once. Call your local emergency services (911 in [place] Do not drive yourself to the hospital. Document Released: 07/24/2004 Document Revised: 10/31/2013 Document Reviewed: 12/17/2012 [employer] Patient Information 2015 [employer]. This information is not intended to replace advice given   to you by your health care provider. Make sure you discuss any questions you have with your health care provider.  
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
## 2014-10-30 ENCOUNTER — Encounter: Payer: Self-pay | Admitting: Cardiology

## 2014-10-30 ENCOUNTER — Ambulatory Visit (INDEPENDENT_AMBULATORY_CARE_PROVIDER_SITE_OTHER): Payer: Medicaid Other | Admitting: Cardiology

## 2014-10-30 VITALS — BP 167/116 | HR 82 | Ht 68.0 in | Wt 134.8 lb

## 2014-10-30 DIAGNOSIS — R011 Cardiac murmur, unspecified: Secondary | ICD-10-CM

## 2014-10-30 DIAGNOSIS — R002 Palpitations: Secondary | ICD-10-CM | POA: Diagnosis not present

## 2014-10-30 NOTE — Patient Instructions (Addendum)
Your physician recommends that you schedule a follow-up appointment in: 6 weeks with Dr. Harl Bowie  Your physician has requested that you have an echocardiogram. Echocardiography is a painless test that uses sound waves to create images of your heart. It provides your doctor with information about the size and shape of your heart and how well your heart's chambers and valves are working. This procedure takes approximately one hour. There are no restrictions for this procedure.  Your physician recommends that you continue on your current medications as directed. Please refer to the Current Medication list given to you today.  Thank you for choosing [facility]!!

## 2014-10-30 NOTE — Progress Notes (Signed)
Clinical Summary Eric Tran is a 64 y.o.male seen today as a new patient for the following medical problems.   1. Hx of CVA - patient with history of multiple CVAs in the past. According to neurology notes has had a pontine stroke, left cerebellar stroke, bilateral thalamic strokes, a left caudate stroke, right posterior limb of internal capsule stroke, right frontal stroke.  - referred for consideration of possible cardiac arrhythmia contributing - reported normal echo from notes however do not see report in system - notes some occasional palpitations, infrequent. Lasts just a few seconds.     Past Medical History  Diagnosis Date  . Hyperthyroidism   . Chronic back pain     chronic narcotic use  . Degenerative disc disease   . Carpal tunnel syndrome   . Arthritis   . Hypertension   . Chronic hepatitis C 1980    never had any treatment  . CVA (cerebral infarction) 06/2010  . Anxiety   . Depression   . UTI (lower urinary tract infection)   . Stroke 2012    left hemiplegia  . Seizures     Epilepsy  . Heart murmur   . Head trauma   . TIA (transient ischemic attack)   . Tremor   . Hepatitis C   . BPH (benign prostatic hyperplasia)   . Chronic pain   . Abdominal aortic aneurysm   . Cerebrovascular disease 10/03/2014  . Abnormality of gait 10/03/2014  . Memory difficulties 10/03/2014  . Partial complex seizure disorder without intractable epilepsy 10/03/2014     Allergies  Allergen Reactions  . Keppra [Levetiracetam] Other (See Comments)    Suicidal ideation  . Morphine And Related Nausea And Vomiting    Can only take through IV     Current Outpatient Prescriptions  Medication Sig Dispense Refill  . ALPRAZolam (XANAX) 0.5 MG tablet Take 1 tablet (0.5 mg total) by mouth 3 (three) times daily as needed for anxiety. For sleep and anxiety (Patient taking differently: Take 0.25 mg by mouth 2 (two) times daily as needed for anxiety. For sleep and anxiety) 30 tablet 0  .  amLODipine (NORVASC) 10 MG tablet Take 5 mg by mouth daily.    . ARIPiprazole (ABILIFY) 5 MG tablet Take 5 mg by mouth daily.    . carbidopa-levodopa (SINEMET IR) 10-100 MG per tablet Take 1 tablet by mouth 3 (three) times daily.    . clopidogrel (PLAVIX) 75 MG tablet Take 75 mg by mouth daily.    Marland Kitchen donepezil (ARICEPT) 10 MG tablet Take 10 mg by mouth at bedtime.    . gabapentin (NEURONTIN) 600 MG tablet Take 600 mg by mouth 3 (three) times daily.    . Glucosamine-Chondroitin (GLUCOSAMINE CHONDR COMPLEX PO) Take 2 tablets by mouth daily.    . hydrALAZINE (APRESOLINE) 25 MG tablet Take 25 mg by mouth 2 (two) times daily.    Marland Kitchen HYDROcodone-acetaminophen (NORCO/VICODIN) 5-325 MG per tablet Take 1 tablet by mouth 3 (three) times daily.    Marland Kitchen lisinopril (PRINIVIL,ZESTRIL) 40 MG tablet Take 40 mg by mouth daily.    . methimazole (TAPAZOLE) 10 MG tablet Take 10 mg by mouth daily.    . Multiple Vitamin (MULTIVITAMIN WITH MINERALS) TABS tablet Take 1 tablet by mouth daily.    Marland Kitchen oxyCODONE-acetaminophen (PERCOCET) 10-325 MG per tablet Take 1 tablet by mouth every 6 (six) hours as needed for pain. 30 tablet 0  . propranolol ER (INDERAL LA) 80 MG 24 hr  capsule Take 80 mg by mouth daily.    . Tamsulosin HCl (FLOMAX) 0.4 MG CAPS Take 0.4 mg by mouth daily.     . traZODone (DESYREL) 50 MG tablet Take 50 mg by mouth at bedtime.    Marland Kitchen venlafaxine XR (EFFEXOR-XR) 75 MG 24 hr capsule Take 225 mg by mouth daily with breakfast.     . zolpidem (AMBIEN) 5 MG tablet Take 5 mg by mouth at bedtime as needed for sleep.     No current facility-administered medications for this visit.     Past Surgical History  Procedure Laterality Date  . Hand surgery      right  . Neck surgery    . Right hand reconstruction       Allergies  Allergen Reactions  . Keppra [Levetiracetam] Other (See Comments)    Suicidal ideation  . Morphine And Related Nausea And Vomiting    Can only take through IV      Family History    Problem Relation Age of Onset  . Colon cancer Neg Hx   . Colon polyps Neg Hx   . Cervical cancer Mother   . Lung cancer Sister   . Breast cancer Sister   . Heart failure Father      Social History Mr. Feinstein reports that he has been smoking Cigarettes.  He has a 45 pack-year smoking history. He has never used smokeless tobacco. Mr. Aderman reports that he drinks alcohol.   Review of Systems CONSTITUTIONAL: No weight loss, fever, chills, weakness or fatigue.  HEENT: Eyes: No visual loss, blurred vision, double vision or yellow sclerae.No hearing loss, sneezing, congestion, runny nose or sore throat.  SKIN: No rash or itching.  CARDIOVASCULAR: per HPI RESPIRATORY: No shortness of breath, cough or sputum.  GASTROINTESTINAL: No anorexia, nausea, vomiting or diarrhea. No abdominal pain or blood.  GENITOURINARY: No burning on urination, no polyuria NEUROLOGICAL: No headache, dizziness, syncope, paralysis, ataxia, numbness or tingling in the extremities. No change in bowel or bladder control.  MUSCULOSKELETAL: No muscle, back pain, joint pain or stiffness.  LYMPHATICS: No enlarged nodes. No history of splenectomy.  PSYCHIATRIC: No history of depression or anxiety.  ENDOCRINOLOGIC: No reports of sweating, cold or heat intolerance. No polyuria or polydipsia.  Marland Kitchen   Physical Examination p 82 bp 167/116 Wt 134 BMI 21 Gen: resting comfortably, no acute distress HEENT: no scleral icterus, pupils equal round and reactive, no palptable cervical adenopathy,  CV: RRR, 2/6 systolic murmur RUSB, no JVD, no carotid bruits Resp: Clear to auscultation bilaterally GI: abdomen is soft, non-tender, non-distended, normal bowel sounds, no hepatosplenomegaly MSK: extremities are warm, no edema.  Skin: warm, no rash Neuro:  no focal deficits Psych: appropriate affect     Assessment and Plan  1. Hx of CVA - multiple CVAs in the past involving mutliple territories - will arrange 30 day monitor to  evaluate for possibly associated cardiac arrhythmia  2. Heart murmur - obtain echo      Arnoldo Lenis, M.D.

## 2014-11-08 ENCOUNTER — Other Ambulatory Visit: Payer: Self-pay

## 2014-11-09 ENCOUNTER — Other Ambulatory Visit: Payer: Self-pay

## 2014-11-09 ENCOUNTER — Ambulatory Visit (INDEPENDENT_AMBULATORY_CARE_PROVIDER_SITE_OTHER): Payer: Non-veteran care

## 2014-11-09 DIAGNOSIS — R011 Cardiac murmur, unspecified: Secondary | ICD-10-CM

## 2014-11-10 ENCOUNTER — Encounter: Payer: Self-pay | Admitting: *Deleted

## 2014-11-13 ENCOUNTER — Telehealth: Payer: Self-pay | Admitting: *Deleted

## 2014-11-13 NOTE — Telephone Encounter (Signed)
Pt made aware, forwarded to pcp 

## 2014-11-13 NOTE — Telephone Encounter (Signed)
-----   Message from Arnoldo Lenis, MD sent at 11/13/2014 11:35 AM EDT ----- Echo shows heart function is very strong. His heart valves all look good   [name] MD

## 2014-11-15 ENCOUNTER — Other Ambulatory Visit: Payer: Self-pay | Admitting: Nurse Practitioner

## 2014-11-15 ENCOUNTER — Ambulatory Visit (HOSPITAL_COMMUNITY)
Admission: RE | Admit: 2014-11-15 | Discharge: 2014-11-15 | Disposition: A | Discharge status: Home / Self Care | Payer: Medicaid Other | Source: Ambulatory Visit | Attending: Internal Medicine | Admitting: Internal Medicine

## 2014-11-15 ENCOUNTER — Encounter (HOSPITAL_COMMUNITY)
Admission: RE | Disposition: A | Discharge status: Home / Self Care | Payer: Medicaid Other | Source: Ambulatory Visit | Attending: Internal Medicine

## 2014-11-15 ENCOUNTER — Encounter: Payer: Self-pay | Admitting: Internal Medicine

## 2014-11-15 ENCOUNTER — Ambulatory Visit (INDEPENDENT_AMBULATORY_CARE_PROVIDER_SITE_OTHER): Payer: Medicaid Other | Admitting: Internal Medicine

## 2014-11-15 VITALS — BP 162/82 | HR 69 | Ht 68.0 in | Wt 138.0 lb

## 2014-11-15 DIAGNOSIS — R55 Syncope and collapse: Secondary | ICD-10-CM | POA: Insufficient documentation

## 2014-11-15 DIAGNOSIS — I679 Cerebrovascular disease, unspecified: Secondary | ICD-10-CM

## 2014-11-15 DIAGNOSIS — G40909 Epilepsy, unspecified, not intractable, without status epilepticus: Secondary | ICD-10-CM | POA: Insufficient documentation

## 2014-11-15 DIAGNOSIS — Z720 Tobacco use: Secondary | ICD-10-CM

## 2014-11-15 DIAGNOSIS — I10 Essential (primary) hypertension: Secondary | ICD-10-CM | POA: Diagnosis not present

## 2014-11-15 DIAGNOSIS — I639 Cerebral infarction, unspecified: Secondary | ICD-10-CM | POA: Diagnosis not present

## 2014-11-15 DIAGNOSIS — Z8673 Personal history of transient ischemic attack (TIA), and cerebral infarction without residual deficits: Secondary | ICD-10-CM | POA: Diagnosis not present

## 2014-11-15 HISTORY — PX: EP IMPLANTABLE DEVICE: SHX172B

## 2014-11-15 SURGERY — LOOP RECORDER INSERTION
Anesthesia: LOCAL

## 2014-11-15 MED ORDER — LIDOCAINE-EPINEPHRINE 1 %-1:100000 IJ SOLN
INTRAMUSCULAR | Status: AC
  Filled 2014-11-15: qty 1

## 2014-11-15 SURGICAL SUPPLY — 2 items
LOOP REVEAL LINQSYS (Prosthesis & Implant Heart) ×1 IMPLANT
PACK LOOP INSERTION (CUSTOM PROCEDURE TRAY) ×2 IMPLANT

## 2014-11-15 NOTE — Discharge Instructions (Signed)
Loop recorder insertion instruction sheet given.

## 2014-11-15 NOTE — Patient Instructions (Signed)
Medication Instructions: - none  Labwork: - none  Procedures/Testing: - Loop Recorder (LINQ) implant today  Follow-Up: - Your physician recommends that you follow-up: Friday 11/24/14 at 11:00 am in the [place] office for a wound check with the device clinic.  Any Additional Special Instructions Will Be Listed Below (If Applicable).  Please report to [facility] Entrance "A" Proceed directly ahead to Admitting to check in You will then be directed to Short Stay to be prepped for the procedure

## 2014-11-15 NOTE — Progress Notes (Signed)
Electrophysiology Office Note   Date:  11/15/2014   ID:  Eric Tran, DOB [birth year], MRN [id number]  PCP:  Jeri Modena  Cardiologist:  [name] Primary Electrophysiologist: Thompson Grayer, MD    Chief Complaint  Patient presents with  . CVA     History of Present Illness: Eric Tran is a 64 y.o. male who presents today for electrophysiology evaluation.   He presents for EP consultation regarding recurrent multifocal strokes.  He has been seen recently by Drs Jannifer Franklin and Harl Bowie (their notes are reviewed).  He reports having his first stroke in 2012.  He was placed on asa.  More recently, he was admitted on 09/24/2014 to [facility] with a five-day history of generalized weakness and slurred speech. The patient developed worsening problems with walking. A CT scan of the head showed evidence of new strokes involving the left caudate, right posterior limb of internal capsule, and right frontal areas. The patient had a prior MRI of the brain done through the [place] hospital in February 2016 that showed evidence of acute strokes in the pons and in the left cerebellum. The patient was placed on Plavix in February 2016. The patient reports some numbness on the left side of the body including arm and leg. The patient now is walking with a walker. He continues to have episodes of falling, his legs will collapse at times. He has severe generalized fatigue. The patient reports some episodes of syncope as well. He carries a h/o seizure disorder.  He reports that he can be walking in the mall and have abrupt onset loss of consciousness without warning.  His wife (a Marine scientist) has observed seizure activity at times with this but has also observed him to have a "black out" without GTC activity.   [facility] he apparently underwent a carotid Doppler study and a 2-D echocardiogram in the latter part of December 2015. The family indicates that the studies were unremarkable.   He was prescribed an event monitor but is allergic to adhesive tape and was unable to keep it in place. He has occasional palpitations.  He has occasional SOB.  He has difficulty with chronic pain.  He has been switched to plavix.  Today, he denies symptoms of palpitations, chest pain, orthopnea, PND, lower extremity edema, claudication, dizziness, presyncope, syncope, bleeding, or neurologic sequela. The patient is tolerating medications without difficulties and is otherwise without complaint today.    Past Medical History  Diagnosis Date  . Hyperthyroidism   . Chronic back pain     chronic narcotic use  . Degenerative disc disease   . Carpal tunnel syndrome   . Arthritis   . Hypertension   . Chronic hepatitis C 1980    never had any treatment  . Anxiety   . Depression   . UTI (lower urinary tract infection)   . Stroke 2012    left hemiplegia  . Seizures     Epilepsy  . Head trauma   . TIA (transient ischemic attack)   . Tremor   . BPH (benign prostatic hyperplasia)   . Chronic pain   . Abdominal aortic aneurysm   . Cerebrovascular disease 10/03/2014  . Abnormality of gait 10/03/2014  . Memory difficulties 10/03/2014  . Partial complex seizure disorder without intractable epilepsy 10/03/2014   Past Surgical History  Procedure Laterality Date  . Hand surgery      right  . Neck surgery    . Right hand  reconstruction       Current Outpatient Prescriptions  Medication Sig Dispense Refill  . ALPRAZolam (XANAX) 0.5 MG tablet Take 1 tablet (0.5 mg total) by mouth 3 (three) times daily as needed for anxiety. For sleep and anxiety (Patient taking differently: Take 0.5 mg by mouth 3 (three) times daily. For sleep and anxiety) 30 tablet 0  . amLODipine (NORVASC) 10 MG tablet Take 5 mg by mouth daily.    . ARIPiprazole (ABILIFY) 5 MG tablet Take 5 mg by mouth daily.    . carbidopa-levodopa (SINEMET IR) 10-100 MG per tablet Take 1 tablet by mouth 3 (three) times daily.    . clopidogrel  (PLAVIX) 75 MG tablet Take 75 mg by mouth daily.    Marland Kitchen donepezil (ARICEPT) 10 MG tablet Take 10 mg by mouth at bedtime.    . gabapentin (NEURONTIN) 600 MG tablet Take 600 mg by mouth 3 (three) times daily.    . Glucosamine-Chondroitin (GLUCOSAMINE CHONDR COMPLEX PO) Take 2 tablets by mouth daily.    . hydrALAZINE (APRESOLINE) 25 MG tablet Take 25 mg by mouth 2 (two) times daily.    . Ledipasvir-Sofosbuvir 90-400 MG TABS Take 1 tablet by mouth daily.    Marland Kitchen lisinopril (PRINIVIL,ZESTRIL) 40 MG tablet Take 40 mg by mouth daily.    . methimazole (TAPAZOLE) 10 MG tablet Take 10 mg by mouth daily.    . Multiple Vitamin (MULTIVITAMIN WITH MINERALS) TABS tablet Take 1 tablet by mouth daily.    . ondansetron (ZOFRAN) 4 MG tablet Take 4 mg by mouth every 8 (eight) hours as needed for nausea or vomiting.    Marland Kitchen oxyCODONE-acetaminophen (PERCOCET) 10-325 MG per tablet Take 1 tablet by mouth every 6 (six) hours as needed for pain. 30 tablet 0  . propranolol ER (INDERAL LA) 80 MG 24 hr capsule Take 80 mg by mouth daily.    . sildenafil (VIAGRA) 50 MG tablet Take 25 mg by mouth daily as needed for erectile dysfunction.     . Tamsulosin HCl (FLOMAX) 0.4 MG CAPS Take 0.4 mg by mouth daily.     . traZODone (DESYREL) 50 MG tablet Take 50 mg by mouth at bedtime as needed for sleep.     . varenicline (CHANTIX) 1 MG tablet Take 1 mg by mouth 2 (two) times daily.    Marland Kitchen venlafaxine XR (EFFEXOR-XR) 75 MG 24 hr capsule Take 225 mg by mouth daily with breakfast.     . zolpidem (AMBIEN) 5 MG tablet Take 5 mg by mouth at bedtime as needed for sleep.     No current facility-administered medications for this visit.    Allergies:   Keppra; Adhesive; and Morphine and related   Social History:  The patient  reports that he has been smoking Cigarettes.  He has a 22.5 pack-year smoking history. He has never used smokeless tobacco. He reports that he drinks alcohol. He reports that he uses illicit drugs (Marijuana).   Family History:   The patient's  family history includes Breast cancer in his sister; Cervical cancer in his mother; Heart failure in his father; Lung cancer in his sister. There is no history of Colon cancer or Colon polyps.    ROS:  Please see the history of present illness.   All other systems are reviewed and negative.    PHYSICAL EXAM: VS:  BP 162/82 mmHg  Pulse 69  Ht 5\' 8"  (1.727 m)  Wt 138 lb (62.596 kg)  BMI 20.99 kg/m2 , BMI Body mass  index is 20.99 kg/(m^2). GEN: Well nourished, well developed, in no acute distress HEENT: normal Neck: no JVD, carotid bruits, or masses Cardiac: RRR; no murmurs, rubs, or gallops,no edema  Respiratory:  Prolonged expiratory phase, normal work of breathing GI: soft, nontender, nondistended, + BS MS: no deformity or atrophy, walks slowly with ca cane Skin: warm and dry  Neuro:  Strength and sensation are intact Psych: flat  affect  EKG:  EKG is ordered today. The ekg ordered today shows sinus rhythm 69 bpm, LVH   Recent Labs: 07/24/2014: ALT 76*; B Natriuretic Peptide 55.0; BUN 14; Creatinine 0.85; Hemoglobin 13.1; Platelets 194; Potassium 3.5; Sodium 134*    Lipid Panel  No results found for: CHOL, TRIG, HDL, CHOLHDL, VLDL, LDLCALC, LDLDIRECT   Wt Readings from Last 3 Encounters:  11/15/14 138 lb (62.596 kg)  10/30/14 134 lb 12.8 oz (61.145 kg)  10/03/14 137 lb 12.8 oz (62.506 kg)      Other studies Reviewed: Additional studies/ records that were reviewed today include: Drs Jannifer Franklin and [place]  Review of the above records today demonstrates: as above  Assessment and Plan:  1. Cryptogenic stroke The patient presents with cryptogenic stroke.  His workup has been uneventful.  I spoke at length with the patient about monitoring for afib with either a 30 day event monitor or an implantable loop recorder.  He does not feel that he can wear an event monitor and would prefer ILR.  Risks, benefits, and alteratives to implantable loop recorder were  discussed with the patient today.   At this time, the patient is very clear in their decision to proceed with implantable loop recorder.   2. Tobacco Smoking cessation advised He is trying to quit  3. Syncope Recurrent, unclear etiology I am not sure that it is pathologic Will follow for arrhythmia with ILR  4. HTN Stable No change required today   Please call with questions.   SignedThompson Grayer, MD  11/15/2014 2:11 PM     [address] [place] [id number] [phone] (office) [phone] (fax)

## 2014-11-16 ENCOUNTER — Encounter (HOSPITAL_COMMUNITY): Payer: Self-pay | Admitting: Internal Medicine

## 2014-11-16 MED FILL — Lidocaine Inj 1% w/ Epinephrine-1:100000: INTRAMUSCULAR | Qty: 20 | Status: AC

## 2014-11-20 ENCOUNTER — Telehealth: Payer: Self-pay | Admitting: *Deleted

## 2014-11-20 DIAGNOSIS — G451 Carotid artery syndrome (hemispheric): Secondary | ICD-10-CM

## 2014-11-20 NOTE — Telephone Encounter (Signed)
Pt wife called to report pt having a "spell" of left side numbness in legs and face that lasted 5 minutes. Says pt was instructed to call [facility] Dr. Jackalyn Lombard nurse when these episodes happen since having loop recorder placed. Pt refuses to go to ED only wants to go home even though explained per protocol with those symptoms this is best option for care. Pt knew that Dr. Rayann Heman and nurse was at [facility] office but wife asked that we send to nurse, phone was almost dead and didn't want to have to call again. Pt denies CP/SOB/Dizziness and wife says numbness went away. Wife didn't have a way to check BP/HR. Will forward to provider and nurse.

## 2014-11-20 NOTE — Telephone Encounter (Signed)
Forwarding to [place] and to [place] for review if anything is showing on the patient's LINQ.

## 2014-11-21 ENCOUNTER — Telehealth: Payer: Self-pay | Admitting: Neurology

## 2014-11-21 NOTE — Telephone Encounter (Signed)
I called the patient. The patient had transient left-sided symptoms lasting about 5 minutes. He is now back to baseline. I will recheck a CT of the head, and get him set up for hypercoagulable state profile. The patient has a loop recorder in place.

## 2014-11-21 NOTE — Telephone Encounter (Signed)
I called the patient's wife. I strongly advised that she take the patient to the ED. I explained that she described classic stroke symptoms in her message and with the patient's history of strokes, he is at a high risk of this happening again. I explained that if they go to the ED, a CT can be done without having to wait on insurance approval. The patient does not want to go to the ED, he would like for Dr. Jannifer Franklin to order any test he feels needs to be done. I explained that any scan ordered in the office setting would require prior approval from insurance, which can take several days. The patient's wife verbalized understanding of this and stated she would call 911 if the patient had another episode. I promised that I would pass this message along to Dr. Jannifer Franklin to see what he would like to have done and advised once again the wife take the patient to the ED if he would agree.

## 2014-11-21 NOTE — Telephone Encounter (Signed)
Patient's wife called to let us know that the patient had an episode yesterday which resulted in left sided weakness with drooping on the left side of his mouth with very slurred speech to the point of being unable to understand what he was saying. It started as he was walking and with his left foot dragging. He stopped to rest when his foot started dragging and that is when the drooping of his face occurred.  He maintained conciousness but could not speak clearly.  The episode lasted about 15 minutes. The heart doctor did not see anything cardiac going on.  The heart doctor's nurse recommended she call his neurologist.  He has ad 2 episodes in the last week. Wife offered that he is taking harzoni which is the cure for hepatitis C and chantex to stop smoking. Please call wife.

## 2014-11-21 NOTE — Telephone Encounter (Signed)
Please refer to telephone note from 11/20/2014. The patient will be set up for CT scan of the brain, come in for a hypercoagulable state workup.

## 2014-11-21 NOTE — Telephone Encounter (Signed)
I had Eric Tran in Eric device clinic review Eric Tran's downloads from his LINQ recorder that was implanted on 11/15/14. Per Eric Tran- to date, there are no events that are showing on Eric Tran's monitor.   I called and spoke with Eric Tran's wife. I advised her Eric Tran's loop recorder is not showing any arrhythmias as of today. She explained to me that Eric Tran had an "episode" yesterday. She and Eric Tran had taken a friend of theirs for a test at [facility] office. Eric Tran did fine walking in, but was SOB when he got up to Eric office. On Eric way out, his wife was walking in front of him and she could hear him dragging his foot. She had to catch him as he could not balance to walk. He then started to have a left sided facial droop/ drawing in his face. He developed slurred speech. Staff offered to call EMS, but he Tran refused. After about 5 minutes, Eric Tran's symptoms started to resolve and within about 15 minutes, he was able to walk. He did recall what had occurred. Per Eric Tran's wife, he has had 6 strokes. She has not seen him with any symptoms like he had yesterday. He went to [facility] ER in January feeling like he "was dying." No scan of his head was done at that point. About 2 weeks later, he had a CT scan and was told he had had 2 strokes in Eric 2 weeks prior. Eric Tran's wife states he must be having these in his sleep. I inquired how he did last night and in to today. Per her report, Eric Tran had 2 additional episodes after leaving Eric office yesterday, where he looked like he was having "seizure" activity. He would get stiff and shake for about 30 seconds. He was not able to communicate with her during these episodes. He did appear slightly disoriented after this occurred. I have advised her that I would forward this message to Dr. Jannifer Franklin, but that they should call [facility] to advise them of Eric situation from yesterday and see if any further testing or follow up is  warranted considering Eric Tran's heart monitor has not showed any events as of today. She verbalizes understanding.

## 2014-11-22 ENCOUNTER — Other Ambulatory Visit (INDEPENDENT_AMBULATORY_CARE_PROVIDER_SITE_OTHER): Payer: Self-pay

## 2014-11-22 DIAGNOSIS — Z0289 Encounter for other administrative examinations: Secondary | ICD-10-CM

## 2014-11-22 DIAGNOSIS — G451 Carotid artery syndrome (hemispheric): Secondary | ICD-10-CM

## 2014-11-24 ENCOUNTER — Ambulatory Visit (INDEPENDENT_AMBULATORY_CARE_PROVIDER_SITE_OTHER): Payer: Medicaid Other | Admitting: *Deleted

## 2014-11-24 ENCOUNTER — Telehealth: Payer: Self-pay

## 2014-11-24 DIAGNOSIS — I639 Cerebral infarction, unspecified: Secondary | ICD-10-CM

## 2014-11-24 LAB — STROKE PANEL
CRP: <0.1 mg/L (ref 0.0–4.9)
Cholesterol, Total: 173 mg/dL (ref 100–199)
HDL: 50 mg/dL (ref >39)
Hgb A1c MFr Bld: 5.5 % (ref 4.8–5.6)
Homocysteine: 13.1 umol/L (ref 0.0–15.0)
LDL Calculated: 89 mg/dL (ref 0–99)
LDl/HDL Ratio: 1.8 ratio units (ref 0.0–3.6)
Sed Rate: 3 mm/hr (ref 0–30)
Triglycerides: 169 mg/dL — ABNORMAL HIGH (ref 0–149)
VLDL Cholesterol Cal: 34 mg/dL (ref 5–40)

## 2014-11-24 LAB — CUP PACEART INCLINIC DEVICE CHECK
Date Time Interrogation Session: 20160527141506
Zone Setting Detection Interval: 2000 ms
Zone Setting Detection Interval: 400 ms
Zone Setting Detection Interval: 4500 ms

## 2014-11-24 NOTE — Progress Notes (Signed)
Wound check in clinic s/p ILR implant. Wound well healed without redness or edema.  Pt with 0 tachy episodes; 0 brady episodes; 0 asystole; 0 symptom; 0 AF episodes.   Plan to continue [facility] f/u QMO and f/u w/JA prn.

## 2014-11-24 NOTE — Telephone Encounter (Signed)
Patient was informed that blood work is relatively unremarkable, no significant abnormalities seen. Slight elevation in triglyceride levels. No change in therapy per Dr. Jannifer Franklin.  Pt asked about CT scan, I informed him that it needed prior approval from insurance before the order could be placed.  He ok is ok with and has no other questions at this time.

## 2014-11-30 ENCOUNTER — Encounter: Payer: Self-pay | Admitting: Cardiology

## 2014-11-30 ENCOUNTER — Ambulatory Visit (INDEPENDENT_AMBULATORY_CARE_PROVIDER_SITE_OTHER): Payer: Medicaid Other | Admitting: Cardiology

## 2014-11-30 VITALS — BP 147/95 | HR 73 | Ht 68.0 in | Wt 138.0 lb

## 2014-11-30 DIAGNOSIS — I639 Cerebral infarction, unspecified: Secondary | ICD-10-CM | POA: Diagnosis not present

## 2014-11-30 DIAGNOSIS — I517 Cardiomegaly: Secondary | ICD-10-CM

## 2014-11-30 MED ORDER — PROPRANOLOL HCL ER 120 MG PO CP24: 120.0000 mg | ORAL_CAPSULE | Freq: Every day | ORAL | Status: DC

## 2014-11-30 NOTE — Patient Instructions (Signed)
Your physician recommends that you schedule a follow-up appointment in: [place]. [place]  Your physician has recommended you make the following change in your medication:   INCREASE PROPRANOLOL 120 MG DAILY  CONTINUE ALL OTHER MEDICATIONS AS DIRECTED  Thank you for choosing [facility]!!

## 2014-11-30 NOTE — Progress Notes (Signed)
Clinical Summary [name] is a 64 y.o.male seen today for follow up of the following medical problems.    1.  Hx of CVA - patient with history of multiple CVAs in the past. According to neurology notes has had a pontine stroke, left cerebellar stroke, bilateral thalamic strokes, a left caudate stroke, right posterior limb of internal capsule stroke, right frontal stroke.  - notes some occasional palpitations, infrequent. Lasts just a few seconds.  - based on high concern for possible cardioembolic etiology, patient was originally referred for outpatient cardiac monitoring for possible afib/aflutter. He preferred an implanted loop recorder as opposed to external monitor, this was placed by Dr Rayann Heman - device check 11/24/14 without significant arrhythmias  2. Left ventricular hypertrophy - dynamic gradient noted on echo, symmetric moderate hypertrophy and hyperdynamic LV is described.  - SOB at 1/2 block, notes some presyncopal episodes most recently about a week ago.  - coffee x 2 cups, glasses of water x 2, soda x 2.  - he is on propanlol for headaches  Past Medical History  Diagnosis Date  . Hyperthyroidism   . Chronic back pain     chronic narcotic use  . Degenerative disc disease   . Carpal tunnel syndrome   . Arthritis   . Hypertension   . Chronic hepatitis C 1980    never had any treatment  . Anxiety   . Depression   . UTI (lower urinary tract infection)   . Stroke 2012    left hemiplegia  . Seizures     Epilepsy  . Head trauma   . TIA (transient ischemic attack)   . Tremor   . BPH (benign prostatic hyperplasia)   . Chronic pain   . Abdominal aortic aneurysm   . Cerebrovascular disease 10/03/2014  . Abnormality of gait 10/03/2014  . Memory difficulties 10/03/2014  . Partial complex seizure disorder without intractable epilepsy 10/03/2014     Allergies  Allergen Reactions  . Keppra [Levetiracetam] Other (See Comments)    Suicidal ideation  . Adhesive [Tape]       Severe rash  . Morphine And Related Nausea And Vomiting    Can only take through IV     Current Outpatient Prescriptions  Medication Sig Dispense Refill  . ALPRAZolam (XANAX) 0.5 MG tablet Take 1 tablet (0.5 mg total) by mouth 3 (three) times daily as needed for anxiety. For sleep and anxiety (Patient taking differently: Take 0.5 mg by mouth 3 (three) times daily. For sleep and anxiety) 30 tablet 0  . amLODipine (NORVASC) 10 MG tablet Take 5 mg by mouth daily.    . ARIPiprazole (ABILIFY) 5 MG tablet Take 5 mg by mouth daily.    . carbidopa-levodopa (SINEMET IR) 10-100 MG per tablet Take 1 tablet by mouth 3 (three) times daily.    . clopidogrel (PLAVIX) 75 MG tablet Take 75 mg by mouth daily.    Marland Kitchen donepezil (ARICEPT) 10 MG tablet Take 10 mg by mouth at bedtime.    . gabapentin (NEURONTIN) 600 MG tablet Take 600 mg by mouth 3 (three) times daily.    . Glucosamine-Chondroitin (GLUCOSAMINE CHONDR COMPLEX PO) Take 2 tablets by mouth daily.    . hydrALAZINE (APRESOLINE) 25 MG tablet Take 25 mg by mouth 2 (two) times daily.    . Ledipasvir-Sofosbuvir 90-400 MG TABS Take 1 tablet by mouth daily.    Marland Kitchen lisinopril (PRINIVIL,ZESTRIL) 40 MG tablet Take 40 mg by mouth daily.    Marland Kitchen  methimazole (TAPAZOLE) 10 MG tablet Take 10 mg by mouth daily.    . Multiple Vitamin (MULTIVITAMIN WITH MINERALS) TABS tablet Take 1 tablet by mouth daily.    . ondansetron (ZOFRAN) 4 MG tablet Take 4 mg by mouth every 8 (eight) hours as needed for nausea or vomiting.    Marland Kitchen oxyCODONE-acetaminophen (PERCOCET) 10-325 MG per tablet Take 1 tablet by mouth every 6 (six) hours as needed for pain. 30 tablet 0  . propranolol ER (INDERAL LA) 80 MG 24 hr capsule Take 80 mg by mouth daily.    . sildenafil (VIAGRA) 50 MG tablet Take 25 mg by mouth daily as needed for erectile dysfunction.     . Tamsulosin HCl (FLOMAX) 0.4 MG CAPS Take 0.4 mg by mouth daily.     . traZODone (DESYREL) 50 MG tablet Take 50 mg by mouth at bedtime as needed  for sleep.     . varenicline (CHANTIX) 1 MG tablet Take 1 mg by mouth 2 (two) times daily.    Marland Kitchen venlafaxine XR (EFFEXOR-XR) 75 MG 24 hr capsule Take 225 mg by mouth daily with breakfast.     . zolpidem (AMBIEN) 5 MG tablet Take 5 mg by mouth at bedtime as needed for sleep.     No current facility-administered medications for this visit.     Past Surgical History  Procedure Laterality Date  . Hand surgery      right  . Neck surgery    . Right hand reconstruction    . Ep implantable device N/A 11/15/2014    Procedure: Loop Recorder Insertion;  Surgeon: Thompson Grayer, MD;  Location: [facility];  Service: Cardiovascular;  Laterality: N/A;     Allergies  Allergen Reactions  . Keppra [Levetiracetam] Other (See Comments)    Suicidal ideation  . Adhesive [Tape]     Severe rash  . Morphine And Related Nausea And Vomiting    Can only take through IV      Family History  Problem Relation Age of Onset  . Colon cancer Neg Hx   . Colon polyps Neg Hx   . Cervical cancer Mother   . Lung cancer Sister   . Breast cancer Sister   . Heart failure Father      Social History [name] reports that he has been smoking Cigarettes.  He has a 22.5 pack-year smoking history. He has never used smokeless tobacco. [name] reports that he drinks alcohol.   Review of Systems CONSTITUTIONAL: No weight loss, fever, chills, weakness or fatigue.  HEENT: Eyes: No visual loss, blurred vision, double vision or yellow sclerae.No hearing loss, sneezing, congestion, runny nose or sore throat.  SKIN: No rash or itching.  CARDIOVASCULAR: per HPI RESPIRATORY: No shortness of breath, cough or sputum.  GASTROINTESTINAL: No anorexia, nausea, vomiting or diarrhea. No abdominal pain or blood.  GENITOURINARY: No burning on urination, no polyuria NEUROLOGICAL: No headache, dizziness, syncope, paralysis, ataxia, numbness or tingling in the extremities. No change in bowel or bladder control.    MUSCULOSKELETAL: No muscle, back pain, joint pain or stiffness.  LYMPHATICS: No enlarged nodes. No history of splenectomy.  PSYCHIATRIC: No history of depression or anxiety.  ENDOCRINOLOGIC: No reports of sweating, cold or heat intolerance. No polyuria or polydipsia.  Marland Kitchen   Physical Examination Filed Vitals:   11/30/14 1002  BP: 147/95  Pulse: 73   Filed Vitals:   11/30/14 1002  Height: 5\' 8"  (1.727 m)  Weight: 138 lb (62.596 kg)  Gen: resting comfortably, no acute distress HEENT: no scleral icterus, pupils equal round and reactive, no palptable cervical adenopathy,  CV: RRR, 2/6 systolic murmur RUSB, no JVD Resp: Clear to auscultation bilaterally GI: abdomen is soft, non-tender, non-distended, normal bowel sounds, no hepatosplenomegaly MSK: extremities are warm, no edema.  Skin: warm, no rash Psych: appropriate affect   Diagnostic Studies 10/2014 echo Study Conclusions  - Left ventricle: The cavity size was mildly reduced. Wall thickness was increased in a pattern of moderate LVH. Systolic function was vigorous. The estimated ejection fraction was in the range of 65% to 70%. There is mitral chordal SAM. Resting LVOT gradient 27 mmHg with increase to 77 mmHg with Valsalva consistent with hypertrophic obstructive cardiomyopathy. Wall motion was normal; there were no regional wall motion abnormalities. Doppler parameters are consistent with abnormal left ventricular relaxation (grade 1 diastolic dysfunction). - Aortic valve: Mildly calcified annulus. Trileaflet; mildly calcified leaflets. Peak gradient (S): 27 mm Hg at rest reflecting LVOT flow. - Aortic root: The aortic root was mildly ectatic. - Mitral valve: Calcified annulus. There was trivial regurgitation. - Left atrium: The atrium was at the upper limits of normal in size. - Right atrium: Central venous pressure (est): 3 mm Hg. - Atrial septum: No defect or patent foramen ovale was  identified. - Tricuspid valve: There was trivial regurgitation. - Pulmonary arteries: Systolic pressure could not be accurately estimated. - Pericardium, extracardiac: There was no pericardial effusion.    Assessment and Plan  1. Hx of CVA - loop recorder in place, continue to follow for possible significant cardiac arrhthmias that may be associated with his multiple CVAs - continue secondary prevention  2. Left ventricular hypertrophy - moderate summetric LVH, hyperdynamic LV function combine to create a dynamic outflow gradient. Notes some DOE and presyncopal episodes.  - hydration encouraged. He has been on propanolol for headaches, will increase to 120mg  daily.    F/u 4 months   Arnoldo Lenis, M.D

## 2014-12-07 ENCOUNTER — Encounter: Payer: Self-pay | Admitting: Internal Medicine

## 2014-12-15 ENCOUNTER — Ambulatory Visit (INDEPENDENT_AMBULATORY_CARE_PROVIDER_SITE_OTHER): Payer: Medicaid Other | Admitting: *Deleted

## 2014-12-15 DIAGNOSIS — I639 Cerebral infarction, unspecified: Secondary | ICD-10-CM | POA: Diagnosis not present

## 2014-12-18 NOTE — Progress Notes (Signed)
Loop recorder 

## 2014-12-26 LAB — CUP PACEART REMOTE DEVICE CHECK: Date Time Interrogation Session: 20160628174055

## 2015-01-09 ENCOUNTER — Encounter: Payer: Self-pay | Admitting: Internal Medicine

## 2015-01-15 ENCOUNTER — Ambulatory Visit (INDEPENDENT_AMBULATORY_CARE_PROVIDER_SITE_OTHER): Payer: Medicaid Other | Admitting: *Deleted

## 2015-01-15 DIAGNOSIS — I639 Cerebral infarction, unspecified: Secondary | ICD-10-CM

## 2015-01-16 NOTE — Progress Notes (Signed)
Loop recorder 

## 2015-01-18 LAB — CUP PACEART REMOTE DEVICE CHECK
Date Time Interrogation Session: 20160630181709
Zone Setting Detection Interval: 2000 ms
Zone Setting Detection Interval: 400 ms
Zone Setting Detection Interval: 4500 ms

## 2015-02-08 ENCOUNTER — Encounter: Payer: Self-pay | Admitting: Internal Medicine

## 2015-02-13 ENCOUNTER — Ambulatory Visit (INDEPENDENT_AMBULATORY_CARE_PROVIDER_SITE_OTHER): Payer: Medicaid Other | Admitting: *Deleted

## 2015-02-13 DIAGNOSIS — I639 Cerebral infarction, unspecified: Secondary | ICD-10-CM | POA: Diagnosis not present

## 2015-02-14 NOTE — Progress Notes (Signed)
Loop recorder 

## 2015-02-26 LAB — CUP PACEART REMOTE DEVICE CHECK: Date Time Interrogation Session: 20160829092030

## 2015-03-07 ENCOUNTER — Encounter: Payer: Self-pay | Admitting: Internal Medicine

## 2015-03-15 ENCOUNTER — Ambulatory Visit (INDEPENDENT_AMBULATORY_CARE_PROVIDER_SITE_OTHER): Payer: Medicaid Other | Admitting: *Deleted

## 2015-03-15 DIAGNOSIS — I639 Cerebral infarction, unspecified: Secondary | ICD-10-CM

## 2015-03-16 NOTE — Progress Notes (Signed)
Loop recorder 

## 2015-03-17 LAB — CUP PACEART REMOTE DEVICE CHECK: Date Time Interrogation Session: 20160917152317

## 2015-03-17 NOTE — Progress Notes (Signed)
[facility] summary report received. Battery status OK. Normal device function. No new symptom episodes, tachy episodes, brady, or pause episodes. No new AF episodes, +Plavix. Monthly summary reports and ROV with JA PRN. 

## 2015-04-04 ENCOUNTER — Encounter: Payer: Self-pay | Admitting: Neurology

## 2015-04-04 ENCOUNTER — Ambulatory Visit (INDEPENDENT_AMBULATORY_CARE_PROVIDER_SITE_OTHER): Payer: Medicaid Other | Admitting: Neurology

## 2015-04-04 VITALS — BP 181/106 | HR 60 | Ht 68.0 in | Wt 149.0 lb

## 2015-04-04 DIAGNOSIS — F05 Delirium due to known physiological condition: Secondary | ICD-10-CM | POA: Diagnosis not present

## 2015-04-04 DIAGNOSIS — R413 Other amnesia: Secondary | ICD-10-CM | POA: Diagnosis not present

## 2015-04-04 DIAGNOSIS — I679 Cerebrovascular disease, unspecified: Secondary | ICD-10-CM

## 2015-04-04 DIAGNOSIS — R269 Unspecified abnormalities of gait and mobility: Secondary | ICD-10-CM | POA: Diagnosis not present

## 2015-04-04 NOTE — Patient Instructions (Addendum)
We will check blood work today, and get a CT of the head and check an EEG study.   Stroke Prevention Some medical conditions and behaviors are associated with an increased chance of having a stroke. You may prevent a stroke by making healthy choices and managing medical conditions. HOW CAN I REDUCE MY RISK OF HAVING A STROKE?   Stay physically active. Get at least 30 minutes of activity on most or all days.  Do not smoke. It may also be helpful to avoid exposure to secondhand smoke.  Limit alcohol use. Moderate alcohol use is considered to be:  No more than 2 drinks per day for men.  No more than 1 drink per day for nonpregnant women.  Eat healthy foods. This involves:  Eating 5 or more servings of fruits and vegetables a day.  Making dietary changes that address high blood pressure (hypertension), high cholesterol, diabetes, or obesity.  Manage your cholesterol levels.  Making food choices that are high in fiber and low in saturated fat, trans fat, and cholesterol may control cholesterol levels.  Take any prescribed medicines to control cholesterol as directed by your health care provider.  Manage your diabetes.  Controlling your carbohydrate and sugar intake is recommended to manage diabetes.  Take any prescribed medicines to control diabetes as directed by your health care provider.  Control your hypertension.  Making food choices that are low in salt (sodium), saturated fat, trans fat, and cholesterol is recommended to manage hypertension.  Ask your health care provider if you need treatment to lower your blood pressure. Take any prescribed medicines to control hypertension as directed by your health care provider.  If you are 30-71 years of age, have your blood pressure checked every 3-5 years. If you are 85 years of age or older, have your blood pressure checked every year.  Maintain a healthy weight.  Reducing calorie intake and making food choices that are low  in sodium, saturated fat, trans fat, and cholesterol are recommended to manage weight.  Stop drug abuse.  Avoid taking birth control pills.  Talk to your health care provider about the risks of taking birth control pills if you are over 80 years old, smoke, get migraines, or have ever had a blood clot.  Get evaluated for sleep disorders (sleep apnea).  Talk to your health care provider about getting a sleep evaluation if you snore a lot or have excessive sleepiness.  Take medicines only as directed by your health care provider.  For some people, aspirin or blood thinners (anticoagulants) are helpful in reducing the risk of forming abnormal blood clots that can lead to stroke. If you have the irregular heart rhythm of atrial fibrillation, you should be on a blood thinner unless there is a good reason you cannot take them.  Understand all your medicine instructions.  Make sure that other conditions (such as anemia or atherosclerosis) are addressed. SEEK IMMEDIATE MEDICAL CARE IF:   You have sudden weakness or numbness of the face, arm, or leg, especially on one side of the body.  Your face or eyelid droops to one side.  You have sudden confusion.  You have trouble speaking (aphasia) or understanding.  You have sudden trouble seeing in one or both eyes.  You have sudden trouble walking.  You have dizziness.  You have a loss of balance or coordination.  You have a sudden, severe headache with no known cause.  You have new chest pain or an irregular heartbeat. Any  of these symptoms may represent a serious problem that is an emergency. Do not wait to see if the symptoms will go away. Get medical help at once. Call your local emergency services (911 in [place] Do not drive yourself to the hospital.   This information is not intended to replace advice given to you by your health care provider. Make sure you discuss any questions you have with your health care provider.   Document  Released: 07/24/2004 Document Revised: 07/07/2014 Document Reviewed: 12/17/2012 Elsevier Interactive Patient Education [employer].
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
## 2015-04-10 ENCOUNTER — Other Ambulatory Visit: Payer: Self-pay | Admitting: Neurology

## 2015-04-12 ENCOUNTER — Ambulatory Visit (INDEPENDENT_AMBULATORY_CARE_PROVIDER_SITE_OTHER): Payer: Medicaid Other | Admitting: Cardiology

## 2015-04-12 ENCOUNTER — Ambulatory Visit
Admission: RE | Admit: 2015-04-12 | Discharge: 2015-04-12 | Disposition: A | Discharge status: Home / Self Care | Payer: Medicaid Other | Source: Ambulatory Visit | Attending: Neurology | Admitting: Neurology

## 2015-04-12 ENCOUNTER — Encounter: Payer: Self-pay | Admitting: Cardiology

## 2015-04-12 ENCOUNTER — Encounter (INDEPENDENT_AMBULATORY_CARE_PROVIDER_SITE_OTHER): Payer: Medicaid Other | Admitting: Neurology

## 2015-04-12 VITALS — BP 96/61 | HR 57 | Ht 68.0 in | Wt 146.8 lb

## 2015-04-12 DIAGNOSIS — I6310 Cerebral infarction due to embolism of unspecified precerebral artery: Secondary | ICD-10-CM | POA: Diagnosis not present

## 2015-04-12 DIAGNOSIS — R413 Other amnesia: Secondary | ICD-10-CM

## 2015-04-12 DIAGNOSIS — I517 Cardiomegaly: Secondary | ICD-10-CM

## 2015-04-12 DIAGNOSIS — F05 Delirium due to known physiological condition: Secondary | ICD-10-CM

## 2015-04-12 DIAGNOSIS — R269 Unspecified abnormalities of gait and mobility: Secondary | ICD-10-CM

## 2015-04-12 DIAGNOSIS — I679 Cerebrovascular disease, unspecified: Secondary | ICD-10-CM

## 2015-04-12 NOTE — Patient Instructions (Signed)
Your physician wants you to follow-up in: 4 months with Dr. Branch  You will receive a reminder letter in the mail two months in advance. If you don't receive a letter, please call our office to schedule the follow-up appointment.  Your physician recommends that you continue on your current medications as directed. Please refer to the Current Medication list given to you today.  Thank you for choosing [facility]!!    

## 2015-04-12 NOTE — Progress Notes (Signed)
Patient ID: LINVILLE DECAROLIS, male   DOB: 1951/02/25, 64 y.o.   MRN: [id number]     Clinical Summary Mr. Musich is a 64 y.o.male seen today for follow up of the following medical problems.   1. Hx of CVA - patient with history of multiple CVAs in the past. According to neurology notes has had a pontine stroke, left cerebellar stroke, bilateral thalamic strokes, a left caudate stroke, right posterior limb of internal capsule stroke, right frontal stroke.  . - based on high concern for possible cardioembolic etiology, patient was originally referred for outpatient cardiac monitoring for possible afib/aflutter. He preferred an implanted loop recorder as opposed to external monitor, this was placed by Dr Rayann Heman - device check 9/16 without significant arrhythmias - denies any recent palpitations  2. Left ventricular hypertrophy - dynamic gradient noted on echo, symmetric moderate hypertrophy and hyperdynamic LV is described.   - he is on propanlol for headaches, we increased the dose last visit due to his dynamic gradient, tolerated well.   3. OSA - sleep study at [facility] - does not use CPAP machine due to discomfort, has not had refitting. Followed [facility] the [facility]   Past Medical History  Diagnosis Date  . Hyperthyroidism   . Chronic back pain     chronic narcotic use  . Degenerative disc disease   . Carpal tunnel syndrome   . Arthritis   . Hypertension   . Chronic hepatitis C ([place]) 1980    never had any treatment  . Anxiety   . Depression   . UTI (lower urinary tract infection)   . Stroke [facility]) 2012    left hemiplegia  . Seizures ([place])     Epilepsy  . Head trauma   . TIA (transient ischemic attack)   . Tremor   . BPH (benign prostatic hyperplasia)   . Chronic pain   . Abdominal aortic aneurysm ([place])   . Cerebrovascular disease 10/03/2014  . Abnormality of gait 10/03/2014  . Memory difficulties 10/03/2014  . Partial complex seizure disorder without intractable epilepsy ([place])  10/03/2014     Allergies  Allergen Reactions  . Keppra [Levetiracetam] Other (See Comments)    Suicidal ideation  . Adhesive [Tape]     Severe rash  . Morphine And Related Nausea And Vomiting    Can only take through IV     Current Outpatient Prescriptions  Medication Sig Dispense Refill  . ALPRAZolam (XANAX) 0.5 MG tablet Take 1 tablet (0.5 mg total) by mouth 3 (three) times daily as needed for anxiety. For sleep and anxiety (Patient taking differently: Take 0.5 mg by mouth 3 (three) times daily. For sleep and anxiety) 30 tablet 0  . amLODipine (NORVASC) 10 MG tablet Take 5 mg by mouth daily.    . carbidopa-levodopa (SINEMET IR) 10-100 MG per tablet Take 1 tablet by mouth 3 (three) times daily.    . clopidogrel (PLAVIX) 75 MG tablet Take 75 mg by mouth daily.    Marland Kitchen donepezil (ARICEPT) 10 MG tablet Take 10 mg by mouth at bedtime.    . gabapentin (NEURONTIN) 600 MG tablet Take 600 mg by mouth 3 (three) times daily.    . Glucosamine-Chondroitin (GLUCOSAMINE CHONDR COMPLEX PO) Take 2 tablets by mouth daily.    . hydrALAZINE (APRESOLINE) 25 MG tablet Take 25 mg by mouth 2 (two) times daily.    Marland Kitchen lisinopril (PRINIVIL,ZESTRIL) 40 MG tablet Take 40 mg by mouth daily.    . methimazole (TAPAZOLE) 10 MG  tablet Take 10 mg by mouth daily.    . Multiple Vitamin (MULTIVITAMIN WITH MINERALS) TABS tablet Take 1 tablet by mouth daily.    . ondansetron (ZOFRAN) 4 MG tablet Take 4 mg by mouth every 8 (eight) hours as needed for nausea or vomiting.    . Oxcarbazepine (TRILEPTAL) 300 MG tablet Take 300 mg by mouth 2 (two) times daily.    Marland Kitchen oxyCODONE-acetaminophen (PERCOCET) 10-325 MG per tablet Take 1 tablet by mouth every 6 (six) hours as needed for pain. 30 tablet 0  . propranolol ER (INDERAL LA) 120 MG 24 hr capsule Take 1 capsule (120 mg total) by mouth daily. 90 capsule 3  . sildenafil (VIAGRA) 50 MG tablet Take 25 mg by mouth daily as needed for erectile dysfunction.     . Tamsulosin HCl (FLOMAX) 0.4  MG CAPS Take 0.4 mg by mouth daily.     . traZODone (DESYREL) 50 MG tablet Take 50 mg by mouth at bedtime as needed for sleep.     Marland Kitchen zolpidem (AMBIEN) 5 MG tablet Take 5 mg by mouth at bedtime as needed for sleep.     No current facility-administered medications for this visit.     Past Surgical History  Procedure Laterality Date  . Hand surgery      right  . Neck surgery    . Right hand reconstruction    . Ep implantable device N/A 11/15/2014    Procedure: Loop Recorder Insertion;  Surgeon: Thompson Grayer, MD;  Location: [facility] [facility];  Service: Cardiovascular;  Laterality: N/A;     Allergies  Allergen Reactions  . Keppra [Levetiracetam] Other (See Comments)    Suicidal ideation  . Adhesive [Tape]     Severe rash  . Morphine And Related Nausea And Vomiting    Can only take through IV      Family History  Problem Relation Age of Onset  . Colon cancer Neg Hx   . Colon polyps Neg Hx   . Cervical cancer Mother   . Lung cancer Sister   . Breast cancer Sister   . Heart failure Father      Social History Mr. Fenter reports that he has been smoking Cigarettes.  He started smoking about 52 years ago. He has a 45 pack-year smoking history. He has never used smokeless tobacco. Mr. Brinegar reports that he does not drink alcohol.   Review of Systems CONSTITUTIONAL: +fatigue HEENT: Eyes: No visual loss, blurred vision, double vision or yellow sclerae.No hearing loss, sneezing, congestion, runny nose or sore throat.  SKIN: No rash or itching.  CARDIOVASCULAR: per HPI RESPIRATORY: No shortness of breath, cough or sputum.  GASTROINTESTINAL: No anorexia, nausea, vomiting or diarrhea. No abdominal pain or blood.  GENITOURINARY: No burning on urination, no polyuria NEUROLOGICAL: No headache, dizziness, syncope, paralysis, ataxia, numbness or tingling in the extremities. No change in bowel or bladder control.  MUSCULOSKELETAL: No muscle, back pain, joint pain or stiffness.    LYMPHATICS: No enlarged nodes. No history of splenectomy.  PSYCHIATRIC: No history of depression or anxiety.  ENDOCRINOLOGIC: No reports of sweating, cold or heat intolerance. No polyuria or polydipsia.  Marland Kitchen   Physical Examination Filed Vitals:   04/12/15 1642  BP: 96/61  Pulse: 57   Filed Vitals:   04/12/15 1642  Height: 5\' 8"  (1.727 m)  Weight: 146 lb 12.8 oz (66.588 kg)    Gen: resting comfortably, no acute distress HEENT: no scleral icterus, pupils equal round and reactive, no  palptable cervical adenopathy,  CV: RRR, no m/r/g, no jvd Resp: Clear to auscultation bilaterally GI: abdomen is soft, non-tender, non-distended, normal bowel sounds, no hepatosplenomegaly MSK: extremities are warm, no edema.  Skin: warm, no rash Neuro:  no focal deficits Psych: appropriate affect   Diagnostic Studies 10/2014 echo Study Conclusions  - Left ventricle: The cavity size was mildly reduced. Wall thickness was increased in a pattern of moderate LVH. Systolic function was vigorous. The estimated ejection fraction was in the range of 65% to 70%. There is mitral chordal SAM. Resting LVOT gradient 27 mmHg with increase to 77 mmHg with Valsalva consistent with hypertrophic obstructive cardiomyopathy. Wall motion was normal; there were no regional wall motion abnormalities. Doppler parameters are consistent with abnormal left ventricular relaxation (grade 1 diastolic dysfunction). - Aortic valve: Mildly calcified annulus. Trileaflet; mildly calcified leaflets. Peak gradient (S): 27 mm Hg at rest reflecting LVOT flow. - Aortic root: The aortic root was mildly ectatic. - Mitral valve: Calcified annulus. There was trivial regurgitation. - Left atrium: The atrium was at the upper limits of normal in size. - Right atrium: Central venous pressure (est): 3 mm Hg. - Atrial septum: No defect or patent foramen ovale was identified. - Tricuspid valve: There was trivial  regurgitation. - Pulmonary arteries: Systolic pressure could not be accurately estimated. - Pericardium, extracardiac: There was no pericardial effusion.    Assessment and Plan   1. Hx of CVA - loop recorder in place, continue to follow for possible significant cardiac arrhthmias that may be associated with his multiple CVAs - continue secondary prevention  2. Left ventricular hypertrophy - dynamic gradient noted on echo due to symmetric LVH and hyperdynamic LV - continue beta blocker, continued to encourage increased hydration  3. OSA - encouraged to follow at [facility] to reevaluate fitting. Likely playing big part in his generalized fatigue.   F/u 4 months      Arnoldo Lenis, M.D.

## 2015-04-13 ENCOUNTER — Telehealth: Payer: Self-pay

## 2015-04-13 LAB — HYPERCOAGULABLE PANEL, COMPREHENSIVE
APTT: 27.3 s
AT III Act/Nor PPP Chro: 117 %
Act. Prt C Resist w/FV Defic.: 2.8 ratio
Anticardiolipin Ab, IgG: <10 [GPL'U]
Anticardiolipin Ab, IgM: <10 [MPL'U]
Beta-2 Glycoprotein I, IgA: <10 SAU
Beta-2 Glycoprotein I, IgG: <10 SGU
Beta-2 Glycoprotein I, IgM: <10 SMU
DRVVT Screen Seconds: 37.1 s
Factor VII Antigen**: 78 %
Factor VIII Activity: 94 %
Hexagonal Phospholipid Neutral: 5 s
Homocysteine: 13.5 umol/L
Prot C Ag Act/Nor PPP Imm: 75 %
Prot S Ag Act/Nor PPP Imm: 110 %
Protein C Ag/FVII Ag Ratio**: 1 ratio
Protein S Ag/FVII Ag Ratio**: 1.4 ratio

## 2015-04-13 NOTE — Telephone Encounter (Signed)
I called and spoke to the patient's wife. I relayed that his lab results were normal.

## 2015-04-13 NOTE — Telephone Encounter (Signed)
-----   Message from Kathrynn Ducking, MD sent at 04/13/2015  7:36 AM EDT -----  The blood work results are unremarkable. Please call the patient.  ----- Message -----    From: [facility] Lab Results In Interface    Sent: 04/13/2015   5:41 AM      To: Kathrynn Ducking, MD

## 2015-04-15 ENCOUNTER — Telehealth: Payer: Self-pay | Admitting: Neurology

## 2015-04-15 NOTE — Telephone Encounter (Signed)
  I called the patient. The CT of the head is stable from 08/2014 no new stroke events.  CT head results 04/13/15:  IMPRESSION: Abnormal CT scan of the head showing multiple remote age lacunar infarcts involving left caudate head, right posterior limb internal capsule, bilateral pons and right frontal cortex with mild changes of age-related chronic microvascular ischemia.. Overall no significant change compared with CT head dated 09/24/2014

## 2015-04-16 ENCOUNTER — Ambulatory Visit (INDEPENDENT_AMBULATORY_CARE_PROVIDER_SITE_OTHER): Payer: Medicaid Other | Admitting: *Deleted

## 2015-04-16 DIAGNOSIS — I6310 Cerebral infarction due to embolism of unspecified precerebral artery: Secondary | ICD-10-CM

## 2015-04-17 NOTE — Progress Notes (Signed)
LOOP RECORDER  

## 2015-04-20 LAB — CUP PACEART REMOTE DEVICE CHECK: Date Time Interrogation Session: 20161015210527

## 2015-04-20 NOTE — Progress Notes (Signed)
Carelink summary report received. Battery status OK. Normal device function. No new symptom episodes, tachy episodes, brady, or pause episodes. No new AF episodes. Monthly summary reports and ROV with JA PRN. 

## 2015-04-24 ENCOUNTER — Other Ambulatory Visit: Payer: Non-veteran care

## 2015-04-24 ENCOUNTER — Encounter: Payer: Self-pay | Admitting: Internal Medicine

## 2015-05-08 ENCOUNTER — Other Ambulatory Visit: Payer: Non-veteran care

## 2015-05-14 ENCOUNTER — Ambulatory Visit (INDEPENDENT_AMBULATORY_CARE_PROVIDER_SITE_OTHER): Payer: Medicaid Other | Admitting: *Deleted

## 2015-05-14 DIAGNOSIS — I6310 Cerebral infarction due to embolism of unspecified precerebral artery: Secondary | ICD-10-CM

## 2015-05-15 NOTE — Progress Notes (Signed)
Carelink summary Report / Loop recorder 

## 2015-05-17 ENCOUNTER — Telehealth: Payer: Self-pay | Admitting: Neurology

## 2015-05-17 ENCOUNTER — Ambulatory Visit (HOSPITAL_COMMUNITY)
Admission: RE | Admit: 2015-05-17 | Discharge: 2015-05-17 | Disposition: A | Discharge status: Home / Self Care | Payer: Medicaid Other | Source: Ambulatory Visit | Attending: Neurology | Admitting: Neurology

## 2015-05-17 DIAGNOSIS — I679 Cerebrovascular disease, unspecified: Secondary | ICD-10-CM

## 2015-05-17 DIAGNOSIS — R269 Unspecified abnormalities of gait and mobility: Secondary | ICD-10-CM | POA: Insufficient documentation

## 2015-05-17 DIAGNOSIS — R413 Other amnesia: Secondary | ICD-10-CM | POA: Insufficient documentation

## 2015-05-17 DIAGNOSIS — F05 Delirium due to known physiological condition: Secondary | ICD-10-CM | POA: Diagnosis not present

## 2015-05-17 NOTE — Procedures (Signed)
History:  64 yo M with transient confusion  Sedation: None  Technique: This is a 21 channel routine scalp EEG performed at the bedside with bipolar and monopolar montages arranged in accordance to the international 10/20 system of electrode placement. One channel was dedicated to EKG recording.    Background: The background consists of intermixed alpha and beta activities. There is a well defined posterior dominant rhythm of 9 Hz that attenuates with eye opening. With drowsiness there in an increase in delta activity. There is no sleep recorded.   Photic stimulation: Physiologic driving is not performed  EEG Abnormalities: None  Clinical Interpretation: This normal EEG is recorded in the waking and drowsy state. There was no seizure or seizure predisposition recorded on this study.   Roland Rack, MD [facility] [phone]  If 7pm- 7am, please page neurology on call as listed in [place].

## 2015-05-17 NOTE — Progress Notes (Signed)
EEG Completed; Results Pending  

## 2015-05-17 NOTE — Telephone Encounter (Signed)
I called patient. The EEG study was normal. The patient has not had any further episodes of confusion or altered mental status. No changes in medication dosing.

## 2015-06-13 ENCOUNTER — Ambulatory Visit (INDEPENDENT_AMBULATORY_CARE_PROVIDER_SITE_OTHER): Payer: Medicaid Other | Admitting: *Deleted

## 2015-06-13 DIAGNOSIS — I6310 Cerebral infarction due to embolism of unspecified precerebral artery: Secondary | ICD-10-CM | POA: Diagnosis not present

## 2015-06-14 NOTE — Progress Notes (Signed)
Carelink Summary Report / Loop Recorder 

## 2015-06-18 LAB — CUP PACEART REMOTE DEVICE CHECK: Date Time Interrogation Session: 20161114210735

## 2015-07-13 ENCOUNTER — Ambulatory Visit (INDEPENDENT_AMBULATORY_CARE_PROVIDER_SITE_OTHER): Payer: Medicare Other | Admitting: *Deleted

## 2015-07-13 DIAGNOSIS — I6310 Cerebral infarction due to embolism of unspecified precerebral artery: Secondary | ICD-10-CM

## 2015-07-16 NOTE — Progress Notes (Signed)
Carelink Summary Report / Loop Recorder 

## 2015-07-28 LAB — CUP PACEART REMOTE DEVICE CHECK: Date Time Interrogation Session: 20161214210844

## 2015-08-13 ENCOUNTER — Ambulatory Visit (INDEPENDENT_AMBULATORY_CARE_PROVIDER_SITE_OTHER): Payer: Medicare Other | Admitting: *Deleted

## 2015-08-13 DIAGNOSIS — I6310 Cerebral infarction due to embolism of unspecified precerebral artery: Secondary | ICD-10-CM

## 2015-08-13 NOTE — Progress Notes (Signed)
Carelink Summary Report / Loop Recorder 

## 2015-09-03 LAB — CUP PACEART REMOTE DEVICE CHECK: Date Time Interrogation Session: 20170113210733

## 2015-09-03 NOTE — Progress Notes (Signed)
Carelink summary report received. Battery status OK. Normal device function. No new symptom episodes, tachy episodes, brady, or pause episodes. No new AF episodes. Monthly summary reports and ROV/PRN 

## 2015-09-05 LAB — CUP PACEART REMOTE DEVICE CHECK: Date Time Interrogation Session: 20170212213749

## 2015-09-05 NOTE — Progress Notes (Signed)
Carelink summary report received. Battery status OK. Normal device function. No new symptom episodes, tachy episodes, brady, or pause episodes. No new AF episodes. Monthly summary reports and ROV/PRN 

## 2015-09-11 ENCOUNTER — Ambulatory Visit (INDEPENDENT_AMBULATORY_CARE_PROVIDER_SITE_OTHER): Payer: Medicare Other | Admitting: *Deleted

## 2015-09-11 DIAGNOSIS — I6310 Cerebral infarction due to embolism of unspecified precerebral artery: Secondary | ICD-10-CM

## 2015-09-12 NOTE — Progress Notes (Signed)
Carelink Summary Report / Loop Recorder 

## 2015-09-13 ENCOUNTER — Emergency Department (HOSPITAL_COMMUNITY): Payer: Non-veteran care

## 2015-09-13 ENCOUNTER — Encounter (HOSPITAL_COMMUNITY): Payer: Self-pay

## 2015-09-13 ENCOUNTER — Emergency Department (HOSPITAL_COMMUNITY)
Admission: EM | Admit: 2015-09-13 | Discharge: 2015-09-13 | Disposition: A | Discharge status: Home / Self Care | Payer: Non-veteran care | Attending: Emergency Medicine | Admitting: Emergency Medicine

## 2015-09-13 DIAGNOSIS — S62001A Unspecified fracture of navicular [scaphoid] bone of right wrist, initial encounter for closed fracture: Secondary | ICD-10-CM | POA: Insufficient documentation

## 2015-09-13 DIAGNOSIS — S0083XA Contusion of other part of head, initial encounter: Secondary | ICD-10-CM | POA: Diagnosis not present

## 2015-09-13 DIAGNOSIS — F329 Major depressive disorder, single episode, unspecified: Secondary | ICD-10-CM | POA: Insufficient documentation

## 2015-09-13 DIAGNOSIS — S50812A Abrasion of left forearm, initial encounter: Secondary | ICD-10-CM | POA: Insufficient documentation

## 2015-09-13 DIAGNOSIS — Y998 Other external cause status: Secondary | ICD-10-CM | POA: Insufficient documentation

## 2015-09-13 DIAGNOSIS — Z8619 Personal history of other infectious and parasitic diseases: Secondary | ICD-10-CM | POA: Diagnosis not present

## 2015-09-13 DIAGNOSIS — Y9389 Activity, other specified: Secondary | ICD-10-CM | POA: Insufficient documentation

## 2015-09-13 DIAGNOSIS — Z79899 Other long term (current) drug therapy: Secondary | ICD-10-CM | POA: Insufficient documentation

## 2015-09-13 DIAGNOSIS — Z87440 Personal history of urinary (tract) infections: Secondary | ICD-10-CM | POA: Diagnosis not present

## 2015-09-13 DIAGNOSIS — G8929 Other chronic pain: Secondary | ICD-10-CM | POA: Insufficient documentation

## 2015-09-13 DIAGNOSIS — R0781 Pleurodynia: Secondary | ICD-10-CM

## 2015-09-13 DIAGNOSIS — Z7902 Long term (current) use of antithrombotics/antiplatelets: Secondary | ICD-10-CM | POA: Insufficient documentation

## 2015-09-13 DIAGNOSIS — S50811A Abrasion of right forearm, initial encounter: Secondary | ICD-10-CM | POA: Insufficient documentation

## 2015-09-13 DIAGNOSIS — F17210 Nicotine dependence, cigarettes, uncomplicated: Secondary | ICD-10-CM | POA: Diagnosis not present

## 2015-09-13 DIAGNOSIS — Y9289 Other specified places as the place of occurrence of the external cause: Secondary | ICD-10-CM | POA: Diagnosis not present

## 2015-09-13 DIAGNOSIS — M1990 Unspecified osteoarthritis, unspecified site: Secondary | ICD-10-CM | POA: Insufficient documentation

## 2015-09-13 DIAGNOSIS — Z8639 Personal history of other endocrine, nutritional and metabolic disease: Secondary | ICD-10-CM | POA: Diagnosis not present

## 2015-09-13 DIAGNOSIS — W19XXXA Unspecified fall, initial encounter: Secondary | ICD-10-CM

## 2015-09-13 DIAGNOSIS — S6991XA Unspecified injury of right wrist, hand and finger(s), initial encounter: Secondary | ICD-10-CM | POA: Diagnosis present

## 2015-09-13 DIAGNOSIS — S29001A Unspecified injury of muscle and tendon of front wall of thorax, initial encounter: Secondary | ICD-10-CM | POA: Insufficient documentation

## 2015-09-13 DIAGNOSIS — W108XXA Fall (on) (from) other stairs and steps, initial encounter: Secondary | ICD-10-CM | POA: Diagnosis not present

## 2015-09-13 DIAGNOSIS — N400 Enlarged prostate without lower urinary tract symptoms: Secondary | ICD-10-CM | POA: Diagnosis not present

## 2015-09-13 DIAGNOSIS — G40209 Localization-related (focal) (partial) symptomatic epilepsy and epileptic syndromes with complex partial seizures, not intractable, without status epilepticus: Secondary | ICD-10-CM | POA: Insufficient documentation

## 2015-09-13 DIAGNOSIS — I10 Essential (primary) hypertension: Secondary | ICD-10-CM | POA: Diagnosis not present

## 2015-09-13 DIAGNOSIS — Z8673 Personal history of transient ischemic attack (TIA), and cerebral infarction without residual deficits: Secondary | ICD-10-CM | POA: Diagnosis not present

## 2015-09-13 DIAGNOSIS — F419 Anxiety disorder, unspecified: Secondary | ICD-10-CM | POA: Diagnosis not present

## 2015-09-13 MED ORDER — OXYCODONE-ACETAMINOPHEN 5-325 MG PO TABS: 2.0000 | ORAL_TABLET | Freq: Four times a day (QID) | ORAL | Status: DC | PRN

## 2015-09-13 NOTE — ED Notes (Signed)
Pt reports he tripped when going down concrete steps this evening PTA and fell forward into the grass. He has bruising to right cheek and reports pain to right hand/forearm and left knee. Pt denies LOC. A&Ox4.

## 2015-09-13 NOTE — ED Notes (Signed)
PA at bedside.

## 2015-09-13 NOTE — Progress Notes (Signed)
Orthopedic Tech Progress Note Patient Details:  EUAL GUIDICE 10-19-1950 [id number]  Ortho Devices Type of Ortho Device: Ace wrap, Thumb spica splint, Volar splint Splint Material: Fiberglass Ortho Device/Splint Location: RUE Ortho Device/Splint Interventions: Ordered, Application   Braulio Bosch 09/13/2015, 10:00 PM

## 2015-09-13 NOTE — ED Provider Notes (Signed)
CSN: [id number]     Arrival date & time 09/13/15  1918 History  By signing my name below, I, Rayna Sexton, attest that this documentation has been prepared under the direction and in the presence of Montine Circle, PA-C. Electronically Signed: Rayna Sexton, ED Scribe. 09/13/2015. 9:59 PM.    Chief Complaint  Patient presents with  . Fall   The history is provided by the patient. No language interpreter was used.   HPI Comments: GIANI HANDELMAN is a 65 y.o. male who presents to the Emergency Department complaining of a fall that occurred PTA. Pt was walking down concrete steps and slipped on the bottom step which was wood causing a forward fall into the grass. He reports associated right facial pain and bruising, right posterior hand abrasions and pain, right forearm pain and left knee pain. His pain worsens with palpation. Pt currently takes Plavix. He confirms his TDAP booster is UTD. Pt denies LOC or any other associated symptoms at this time.    Past Medical History  Diagnosis Date  . Hyperthyroidism   . Chronic back pain     chronic narcotic use  . Degenerative disc disease   . Carpal tunnel syndrome   . Arthritis   . Hypertension   . Chronic hepatitis C ([place]) 1980    never had any treatment  . Anxiety   . Depression   . UTI (lower urinary tract infection)   . Stroke [facility]) 2012    left hemiplegia  . Seizures ([place])     Epilepsy  . Head trauma   . TIA (transient ischemic attack)   . Tremor   . BPH (benign prostatic hyperplasia)   . Chronic pain   . Abdominal aortic aneurysm ([place])   . Cerebrovascular disease 10/03/2014  . Abnormality of gait 10/03/2014  . Memory difficulties 10/03/2014  . Partial complex seizure disorder without intractable epilepsy ([place]) 10/03/2014   Past Surgical History  Procedure Laterality Date  . Hand surgery      right  . Neck surgery    . Right hand reconstruction    . Ep implantable device N/A 11/15/2014    Procedure: Loop Recorder Insertion;   Surgeon: Thompson Grayer, MD;  Location: [place] [facility];  Service: Cardiovascular;  Laterality: N/A;   Family History  Problem Relation Age of Onset  . Colon cancer Neg Hx   . Colon polyps Neg Hx   . Cervical cancer Mother   . Lung cancer Sister   . Breast cancer Sister   . Heart failure Father    Social History  Substance Use Topics  . Smoking status: Current Every Day Smoker -- 0.50 packs/day for 45 years    Types: Cigarettes    Start date: 07/18/1962  . Smokeless tobacco: Never Used  . Alcohol Use: No     Comment: occasionally    Review of Systems  Musculoskeletal: Positive for myalgias, joint swelling and arthralgias.  Skin: Positive for color change and wound.  Neurological: Negative for syncope.    Allergies  Keppra; Adhesive; and Morphine and related  Home Medications   Prior to Admission medications   Medication Sig Start Date End Date Taking? Authorizing Provider  ALPRAZolam Duanne Moron) 0.5 MG tablet Take 1 tablet (0.5 mg total) by mouth 3 (three) times daily as needed for anxiety. For sleep and anxiety Patient taking differently: Take 0.5 mg by mouth 3 (three) times daily. For sleep and anxiety 02/03/14   Barton Dubois, MD  amLODipine ([place]) 10  MG tablet Take 5 mg by mouth daily.    Historical Provider, MD  carbidopa-levodopa (SINEMET IR) 10-100 MG per tablet Take 1 tablet by mouth 3 (three) times daily.    Historical Provider, MD  clopidogrel (PLAVIX) 75 MG tablet Take 75 mg by mouth daily.    Historical Provider, MD  gabapentin (NEURONTIN) 600 MG tablet Take 600 mg by mouth 3 (three) times daily.    Historical Provider, MD  Glucosamine-Chondroitin (GLUCOSAMINE CHONDR COMPLEX PO) Take 2 tablets by mouth daily.    Historical Provider, MD  hydrALAZINE (APRESOLINE) 25 MG tablet Take 25 mg by mouth 2 (two) times daily.    Historical Provider, MD  lisinopril (PRINIVIL,ZESTRIL) 40 MG tablet Take 40 mg by mouth daily.    Historical Provider, MD  methimazole (TAPAZOLE) 10  MG tablet Take 10 mg by mouth daily.    Historical Provider, MD  Multiple Vitamin (MULTIVITAMIN WITH MINERALS) TABS tablet Take 1 tablet by mouth daily.    Historical Provider, MD  ondansetron (ZOFRAN) 4 MG tablet Take 4 mg by mouth every 8 (eight) hours as needed for nausea or vomiting.    Historical Provider, MD  Oxcarbazepine (TRILEPTAL) 300 MG tablet Take 300 mg by mouth 2 (two) times daily.    Historical Provider, MD  oxyCODONE-acetaminophen (PERCOCET) 10-325 MG per tablet Take 1 tablet by mouth every 6 (six) hours as needed for pain. 02/03/14   Barton Dubois, MD  propranolol ER (INDERAL LA) 120 MG 24 hr capsule Take 1 capsule (120 mg total) by mouth daily. 11/30/14   Arnoldo Lenis, MD  sildenafil (VIAGRA) 50 MG tablet Take 25 mg by mouth daily as needed for erectile dysfunction.     Historical Provider, MD  Tamsulosin HCl (FLOMAX) 0.4 MG CAPS Take 0.4 mg by mouth daily.     Historical Provider, MD  traZODone (DESYREL) 50 MG tablet Take 50 mg by mouth at bedtime as needed for sleep.     Historical Provider, MD  zolpidem (AMBIEN) 5 MG tablet Take 5 mg by mouth at bedtime as needed for sleep.    Historical Provider, MD   BP 148/96 mmHg  Pulse 76  Temp(Src) 98.2 F (36.8 C) (Oral)  Resp 18  SpO2 97%    Physical Exam  Constitutional: He is oriented to person, place, and time. He appears well-developed and well-nourished.  HENT:  Head: Normocephalic and atraumatic.  Eyes: Conjunctivae and EOM are normal. Pupils are equal, round, and reactive to light. Right eye exhibits no discharge. Left eye exhibits no discharge. No scleral icterus.  Neck: Normal range of motion. Neck supple. No JVD present.  Cardiovascular: Normal rate, regular rhythm and normal heart sounds.  Exam reveals no gallop and no friction rub.   No murmur heard. Pulmonary/Chest: Effort normal and breath sounds normal. No respiratory distress. He has no wheezes. He has no rales. He exhibits no tenderness.  Abdominal: Soft. He  exhibits no distension and no mass. There is no tenderness. There is no rebound and no guarding.  Musculoskeletal: Normal range of motion. He exhibits no edema or tenderness.  Range of motion and strength follow-up extremities is 5/5 Patient does have some tenderness to palpation at the right anatomical snuffbox  Neurological: He is alert and oriented to person, place, and time.  Skin: Skin is warm and dry.  Mild abrasions on forearms Contusion to right cheek  Psychiatric: He has a normal mood and affect. His behavior is normal. Judgment and thought content normal.  Nursing  note and vitals reviewed.   ED Course  Procedures  DIAGNOSTIC STUDIES: Oxygen Saturation is 97% on RA, normal by my interpretation.    COORDINATION OF CARE: 9:49 PM Discussed next steps with pt. He verbalized understanding and is agreeable with the plan.   Labs Review Labs Reviewed - No data to display  Imaging Review Dg Forearm Right  09/13/2015  CLINICAL DATA:  Fall EXAM: RIGHT FOREARM - 2 VIEW COMPARISON:  None. FINDINGS: Acute fracture. No dislocation. Degenerative changes in the elbow joint are noted. Unremarkable soft tissues. IMPRESSION: No acute bony pathology. Electronically Signed   By: Marybelle Killings M.D.   On: 09/13/2015 20:32   Dg Wrist Complete Right  09/13/2015  CLINICAL DATA:  Fall EXAM: RIGHT WRIST - COMPLETE 3+ VIEW COMPARISON:  None. FINDINGS: No acute fracture.  No dislocation.  Mild osteopenia. IMPRESSION: No acute bony pathology. Electronically Signed   By: Marybelle Killings M.D.   On: 09/13/2015 20:33   Dg Knee Complete 4 Views Left  09/13/2015  CLINICAL DATA:  Fall EXAM: LEFT KNEE - COMPLETE 4+ VIEW COMPARISON:  None. FINDINGS: No acute fracture. No dislocation. Minimal vascular calcification. Minimal degenerative change. IMPRESSION: No acute bony pathology. Electronically Signed   By: Marybelle Killings M.D.   On: 09/13/2015 20:35   Dg Hand Complete Right  09/13/2015  CLINICAL DATA:  Status post fall  with pain of the right scaphoid. EXAM: RIGHT HAND - COMPLETE 3+ VIEW COMPARISON:  None. FINDINGS: There is subtle lucency in the scaphoid suspicious for nondisplaced fracture. There is a old posttraumatic change of the fifth and third metacarpals. There is no dislocation. IMPRESSION: Subtle lucency in the scaphoid suspicious for nondisplaced fracture. Electronically Signed   By: Abelardo Diesel M.D.   On: 09/13/2015 20:36   I have personally reviewed and evaluated these images as part of my medical decision-making.    MDM   Final diagnoses:  Fall, initial encounter  Scaphoid fracture of wrist, right, closed, initial encounter  Rib pain    Patient with mechanical fall as his walking down some stairs today. We'll check appropriate imaging, and reassess.  Imaging remarkable for possible right-sided scaphoid fracture, otherwise negative. Will place patient in a thumb spica splint and recommend hand surgery follow-up. Patient understands and agrees with plan. He is ambulatory. Feels well. He is stable and ready for discharge.  I personally performed the services described in this documentation, which was scribed in my presence. The recorded information has been reviewed and is accurate.      Montine Circle, PA-C 09/13/15 [id number]  Dorie Rank, MD 09/14/15 [phone]

## 2015-09-13 NOTE — ED Notes (Signed)
Pt taken, via wheelchair, to patient's wife room on [address]

## 2015-09-13 NOTE — ED Notes (Signed)
Patient able to ambulate independently  

## 2015-09-13 NOTE — ED Notes (Signed)
PA made ortho aware of need for splinting in future

## 2015-09-13 NOTE — Discharge Instructions (Signed)
Scaphoid Fracture, Wrist A fracture is a break in the bone. The bone you have broken often does not show up as a fracture on x-ray until later on in the healing phase. This bone is called the scaphoid bone. With this bone, your caregiver will often cast or splint your wrist as though it is fractured, even if a fracture is not seen on the x-ray. This is often done with wrist injuries in which there is tenderness at the base of the thumb. An x-ray at 1-3 weeks after your injury may confirm this fracture. A cast or splint is used to protect and keep your injured bone in good position for healing. The cast or splint will be on generally for about 6 to 16 weeks, depending on your health, age, the fracture location and how quickly you heal. Another name for the scaphoid bone is the navicular bone. HOME CARE INSTRUCTIONS   To lessen the swelling and pain, keep the injured part elevated above your heart while sitting or lying down.  Apply ice to the injury for 15-20 minutes, 03-04 times per day while awake, for 2 days. Put the ice in a plastic bag and place a thin towel between the bag of ice and your cast.  If you have a plaster or fiberglass cast or splint:  Do not try to scratch the skin under the cast using sharp or pointed objects.  Check the skin around the cast every day. You may put lotion on any red or sore areas.  Keep your cast or splint dry and clean.  If you have a plaster splint:  Wear the splint as directed.  You may loosen the elastic bandage around the splint if your fingers become numb, tingle, or turn cold or blue.  If you have been put in a removable splint, wear and use as directed.  Do not put pressure on any part of your cast or splint; it may deform or break. Rest your cast or splint only on a pillow the first 24 hours until it is fully hardened.  Your cast or splint can be protected during bathing with a plastic bag. Do not lower the cast or splint into water.  Only take  over-the-counter or prescription medicines for pain, discomfort, or fever as directed by your caregiver.  If your caregiver has given you a follow up appointment, it is very important to keep that appointment. Not keeping the appointment could result in chronic pain and decreased function. If there is any problem keeping the appointment, you must call back to this facility for assistance. SEEK IMMEDIATE MEDICAL CARE IF:   Your cast gets damaged, wet or breaks.  You have continued severe pain or more swelling than you did before the cast or splint was put on.  Your skin or nails below the injury turn blue or gray, or feel cold or numb.  You have tingling or burning pain in your fingers or increasing pain with movement of your fingers   This information is not intended to replace advice given to you by your health care provider. Make sure you discuss any questions you have with your health care provider.   Document Released: 06/06/2002 Document Revised: 09/08/2011 Document Reviewed: 12/27/2014 Elsevier Interactive Patient Education [employer].

## 2015-09-14 MED FILL — OXYCODONE/APAP 5-325: 5-325 | 2 days supply | Qty: 15 | Fill #0

## 2015-10-03 ENCOUNTER — Encounter: Payer: Self-pay | Admitting: Neurology

## 2015-10-03 ENCOUNTER — Ambulatory Visit (INDEPENDENT_AMBULATORY_CARE_PROVIDER_SITE_OTHER): Payer: Medicare Other | Admitting: Neurology

## 2015-10-03 ENCOUNTER — Telehealth: Payer: Self-pay | Admitting: Neurology

## 2015-10-03 VITALS — BP 112/78 | HR 69 | Ht 68.0 in | Wt 152.5 lb

## 2015-10-03 DIAGNOSIS — R269 Unspecified abnormalities of gait and mobility: Secondary | ICD-10-CM

## 2015-10-03 DIAGNOSIS — R413 Other amnesia: Secondary | ICD-10-CM | POA: Diagnosis not present

## 2015-10-03 DIAGNOSIS — G40209 Localization-related (focal) (partial) symptomatic epilepsy and epileptic syndromes with complex partial seizures, not intractable, without status epilepticus: Secondary | ICD-10-CM

## 2015-10-03 DIAGNOSIS — I679 Cerebrovascular disease, unspecified: Secondary | ICD-10-CM

## 2015-10-03 MED ORDER — MEMANTINE HCL 28 X 5 MG & 21 X 10 MG PO TABS: ORAL_TABLET | ORAL | Status: DC

## 2015-10-03 NOTE — Patient Instructions (Signed)

## 2015-10-03 NOTE — Telephone Encounter (Signed)
Pt's wife called to give the name of [facility] .  Dicvalproex  250 mg 24hr ER SA Tab, once a day ( night time) . Mirtavapine 15mg  once at bedtime.

## 2015-10-03 NOTE — Telephone Encounter (Signed)
I added to med list.

## 2015-10-03 NOTE — Progress Notes (Signed)
Reason for visit: Seizures  Eric Tran is an 65 y.o. male  History of present illness:  Eric Tran is a 65 year old right-handed white male with a history of blackout episodes felt secondary to seizures, he has a history of head trauma and cerebrovascular disease and an associated memory disorder. The patient has been on Trileptal taking 300 mg twice daily, he apparently had multiple seizure-type events in December 2016 with episodes of staring, and then loss of consciousness. The patient was placed on another seizure medication that the daughter cannot remember the name of through the [facility]. Since this other medication was added, the patient has not had any further episodes of loss of consciousness. He has a chronic pain syndrome with neck discomfort, MRI cervical evaluation has been done through the [facility] hospital, and Dr. Francesco Runner has been seeing him through a pain center, he may have an injection in the neck in the near future. The patient has underlying gait disorder. He indicates that he does operate a motor vehicle at times. The memory issues have apparently worsened slightly as time has gone on. He returns this office for an evaluation. A recent CT of the head done showed no change from prior study, EEG evaluation was normal.   Past Medical History  Diagnosis Date  . Hyperthyroidism   . Chronic back pain     chronic narcotic use  . Degenerative disc disease   . Carpal tunnel syndrome   . Arthritis   . Hypertension   . Chronic hepatitis C ([place]) 1980    never had any treatment  . Anxiety   . Depression   . UTI (lower urinary tract infection)   . Stroke [facility]) 2012    left hemiplegia  . Seizures ([place])     Epilepsy  . Head trauma   . TIA (transient ischemic attack)   . Tremor   . BPH (benign prostatic hyperplasia)   . Chronic pain   . Abdominal aortic aneurysm ([place])   . Cerebrovascular disease 10/03/2014  . Abnormality of gait 10/03/2014  . Memory difficulties 10/03/2014  .  Partial complex seizure disorder without intractable epilepsy ([place]) 10/03/2014    Past Surgical History  Procedure Laterality Date  . Hand surgery      right  . Neck surgery    . Right hand reconstruction    . Ep implantable device N/A 11/15/2014    Procedure: Loop Recorder Insertion;  Surgeon: Thompson Grayer, MD;  Location: [facility];  Service: Cardiovascular;  Laterality: N/A;    Family History  Problem Relation Age of Onset  . Colon cancer Neg Hx   . Colon polyps Neg Hx   . Cervical cancer Mother   . Lung cancer Sister   . Breast cancer Sister   . Heart failure Father     Social history:  reports that he has been smoking Cigarettes.  He started smoking about 53 years ago. He has a 4.5 pack-year smoking history. He has never used smokeless tobacco. He reports that he does not drink alcohol or use illicit drugs.    Allergies  Allergen Reactions  . Keppra [Levetiracetam] Other (See Comments)    Suicidal ideation, anaphylaxsis  . Adhesive [Tape]     Severe rash  . Morphine And Related Nausea And Vomiting    Can only take through IV.    Medications:  Prior to Admission medications   Medication Sig Start Date End Date Taking? Authorizing Provider  ALPRAZolam Duanne Moron)  0.5 MG tablet Take 1 tablet (0.5 mg total) by mouth 3 (three) times daily as needed for anxiety. For sleep and anxiety Patient taking differently: Take 0.5 mg by mouth 3 (three) times daily. For sleep and anxiety 02/03/14  Yes Barton Dubois, MD  amLODipine (NORVASC) 10 MG tablet Take 5 mg by mouth daily.   Yes Historical Provider, MD  carbidopa-levodopa (SINEMET IR) 10-100 MG per tablet Take 1 tablet by mouth 3 (three) times daily.   Yes Historical Provider, MD  gabapentin (NEURONTIN) 600 MG tablet Take 600 mg by mouth 3 (three) times daily.   Yes Historical Provider, MD  Glucosamine-Chondroitin (GLUCOSAMINE CHONDR COMPLEX PO) Take 2 tablets by mouth daily.   Yes Historical Provider, MD  hydrALAZINE  (APRESOLINE) 25 MG tablet Take 25 mg by mouth 2 (two) times daily.   Yes Historical Provider, MD  lisinopril (PRINIVIL,ZESTRIL) 40 MG tablet Take 40 mg by mouth daily.   Yes Historical Provider, MD  methimazole (TAPAZOLE) 10 MG tablet Take 10 mg by mouth daily.   Yes Historical Provider, MD  mirtazapine (REMERON) 15 MG tablet Take 15 mg by mouth at bedtime.   Yes Historical Provider, MD  Multiple Vitamin (MULTIVITAMIN WITH MINERALS) TABS tablet Take 1 tablet by mouth daily.   Yes Historical Provider, MD  ondansetron (ZOFRAN) 4 MG tablet Take 4 mg by mouth every 8 (eight) hours as needed for nausea or vomiting.   Yes Historical Provider, MD  Oxcarbazepine (TRILEPTAL) 300 MG tablet Take 300 mg by mouth 2 (two) times daily.   Yes Historical Provider, MD  propranolol ER (INDERAL LA) 120 MG 24 hr capsule Take 1 capsule (120 mg total) by mouth daily. 11/30/14  Yes Arnoldo Lenis, MD  sildenafil (VIAGRA) 50 MG tablet Take 25 mg by mouth daily as needed for erectile dysfunction.    Yes Historical Provider, MD  Tamsulosin HCl (FLOMAX) 0.4 MG CAPS Take 0.4 mg by mouth daily.    Yes Historical Provider, MD  UNKNOWN TO PATIENT Another seizure medication (received from [place]).   Yes Historical Provider, MD  clopidogrel (PLAVIX) 75 MG tablet Take 75 mg by mouth daily. Reported on 10/03/2015    Historical Provider, MD    ROS:  Out of a complete 14 system review of symptoms, the patient complains only of the following symptoms, and all other reviewed systems are negative.  Fatigue, decreased weight Ringing in the ears, runny nose Blurred vision Cough, shortness of breath Heart murmur Cold intolerance  Blood pressure 112/78, pulse 69, height 5\' 8"  (1.727 m), weight 152 lb 8 oz (69.174 kg).  Physical Exam  General: The patient is alert and cooperative at the time of the examination.  Skin: No significant peripheral edema is noted.   Neurologic Exam  Mental status: The patient is alert and oriented x 2  at the time of the examination (not oriented to date). The Mini-Mental Status Examination done today shows a total score 20/30. The patient is able to name 7 animals in 30 seconds..   Cranial nerves: Facial symmetry is present. Speech is normal, no aphasia or dysarthria is noted. Extraocular movements are full. Visual fields are full.  Motor: The patient has good strength in all 4 extremities.  Sensory examination: Soft touch sensation is decreased on the left arm and leg relative to the right, symmetric on the face.  Coordination: The patient has good finger-nose-finger and heel-to-shin bilaterally.   Gait and station: The patient has a slightly wide-based gait, tandem gait is  unsteady. Romberg is negative.  Reflexes: Deep tendon reflexes are symmetric.   CT head results 04/13/15:  IMPRESSION: Abnormal CT scan of the head showing multiple remote age lacunar infarcts involving left caudate head, right posterior limb internal capsule, bilateral pons and right frontal cortex with mild changes of age-related chronic microvascular ischemia.. Overall no significant change compared with CT head dated 09/24/2014   Assessment/Plan:  1. Episodic blackouts, probable seizures  2. Chronic pain syndrome, cervical spondylosis  3. Memory disorder  4. Gait disorder  The patient will be started on Namenda for his memory. If he is on 2 different seizure medications plus gabapentin. The daughters are to contact our office with the name of the second seizure medication that was added in December 2016. The patient will follow-up in 6 months, sooner if needed. They are to contact me one month if the Namenda titration pack is well tolerated, a maintenance dose of the Namenda will be called in.  Addendum: Depakote 250 mg QHS was added.  Jill Alexanders MD 10/03/2015 6:35 PM  [facility] [address] [place], [place] [address]  Phone [phone] Fax [phone]

## 2015-10-09 DIAGNOSIS — J449 Chronic obstructive pulmonary disease, unspecified: Secondary | ICD-10-CM | POA: Diagnosis not present

## 2015-10-09 DIAGNOSIS — M9981 Other biomechanical lesions of cervical region: Secondary | ICD-10-CM | POA: Diagnosis not present

## 2015-10-09 DIAGNOSIS — I10 Essential (primary) hypertension: Secondary | ICD-10-CM | POA: Diagnosis not present

## 2015-10-09 DIAGNOSIS — Z886 Allergy status to analgesic agent status: Secondary | ICD-10-CM | POA: Diagnosis not present

## 2015-10-09 DIAGNOSIS — Z809 Family history of malignant neoplasm, unspecified: Secondary | ICD-10-CM | POA: Diagnosis not present

## 2015-10-09 DIAGNOSIS — M47817 Spondylosis without myelopathy or radiculopathy, lumbosacral region: Secondary | ICD-10-CM | POA: Diagnosis not present

## 2015-10-09 DIAGNOSIS — M5030 Other cervical disc degeneration, unspecified cervical region: Secondary | ICD-10-CM | POA: Diagnosis not present

## 2015-10-09 DIAGNOSIS — M47812 Spondylosis without myelopathy or radiculopathy, cervical region: Secondary | ICD-10-CM | POA: Diagnosis not present

## 2015-10-09 DIAGNOSIS — Z8673 Personal history of transient ischemic attack (TIA), and cerebral infarction without residual deficits: Secondary | ICD-10-CM | POA: Diagnosis not present

## 2015-10-09 DIAGNOSIS — Z888 Allergy status to other drugs, medicaments and biological substances status: Secondary | ICD-10-CM | POA: Diagnosis not present

## 2015-10-09 DIAGNOSIS — Z823 Family history of stroke: Secondary | ICD-10-CM | POA: Diagnosis not present

## 2015-10-11 ENCOUNTER — Ambulatory Visit (INDEPENDENT_AMBULATORY_CARE_PROVIDER_SITE_OTHER): Payer: Medicare Other | Admitting: *Deleted

## 2015-10-11 DIAGNOSIS — I6310 Cerebral infarction due to embolism of unspecified precerebral artery: Secondary | ICD-10-CM | POA: Diagnosis not present

## 2015-10-15 NOTE — Progress Notes (Signed)
Carelink Summary Report / Loop Recorder 

## 2015-10-24 DIAGNOSIS — G894 Chronic pain syndrome: Secondary | ICD-10-CM | POA: Diagnosis not present

## 2015-10-29 ENCOUNTER — Telehealth: Payer: Self-pay | Admitting: Cardiology

## 2015-10-29 ENCOUNTER — Telehealth: Payer: Self-pay | Admitting: Neurology

## 2015-10-29 DIAGNOSIS — G459 Transient cerebral ischemic attack, unspecified: Secondary | ICD-10-CM | POA: Diagnosis not present

## 2015-10-29 DIAGNOSIS — R55 Syncope and collapse: Secondary | ICD-10-CM | POA: Diagnosis not present

## 2015-10-29 DIAGNOSIS — Z79899 Other long term (current) drug therapy: Secondary | ICD-10-CM | POA: Diagnosis not present

## 2015-10-29 DIAGNOSIS — R9431 Abnormal electrocardiogram [ECG] [EKG]: Secondary | ICD-10-CM | POA: Diagnosis not present

## 2015-10-29 DIAGNOSIS — J449 Chronic obstructive pulmonary disease, unspecified: Secondary | ICD-10-CM | POA: Diagnosis not present

## 2015-10-29 DIAGNOSIS — I469 Cardiac arrest, cause unspecified: Secondary | ICD-10-CM | POA: Diagnosis not present

## 2015-10-29 DIAGNOSIS — Z720 Tobacco use: Secondary | ICD-10-CM | POA: Diagnosis not present

## 2015-10-29 DIAGNOSIS — R410 Disorientation, unspecified: Secondary | ICD-10-CM | POA: Diagnosis not present

## 2015-10-29 DIAGNOSIS — B182 Chronic viral hepatitis C: Secondary | ICD-10-CM | POA: Diagnosis not present

## 2015-10-29 DIAGNOSIS — Z885 Allergy status to narcotic agent status: Secondary | ICD-10-CM | POA: Diagnosis not present

## 2015-10-29 DIAGNOSIS — R079 Chest pain, unspecified: Secondary | ICD-10-CM | POA: Diagnosis not present

## 2015-10-29 DIAGNOSIS — I10 Essential (primary) hypertension: Secondary | ICD-10-CM | POA: Diagnosis not present

## 2015-10-29 NOTE — Telephone Encounter (Signed)
No alerts or abnormalities as of 10/29/15 (automatic AM transmission).  Will await manual transmission for review.

## 2015-10-29 NOTE — Telephone Encounter (Signed)
Patient's wife Eric Tran is calling and states her husband is in [facility] after having an episode seizure at his PCP's this morning.  An EKG revealed an abnormal EKG so he was sent to the hospital for an overnight observation.  Wife states Dr. Jake Samples @[phone] [id number] would like to speak with Dr. Jannifer Franklin about his meds and the EKG result, etc.

## 2015-10-29 NOTE — Telephone Encounter (Signed)
The patient had another event of altered consciousness today. There is some question of an EKG abdomen mouth he, but the ER physician does not believe that this represents a heart attack. He will be admitted for overnight observation. He is on low-dose Depakote taking 250 mg at night, we'll go to 500 mg twice daily of this medication, see how he does on this drug. He is also on Trileptal. He was taken off of gabapentin, recently changed to Lyrica.

## 2015-10-29 NOTE — Telephone Encounter (Signed)
Pt wife is calling to report the pt is in the ER. He had an EKG done at the ER and they said it was abnormal. Pt wife stated that pt has not felt well all day and she was going to take him to the doctor. She said by the time he get from their house to the truck pt was short of breath, real sweaty, and seemed to be spaced out. Pt doesn't recall having any chest pain. Instructed pt wife to send a remote transmission w/ pt home monitor. I informed her that if pt get admitted to the hospital she can take the home monitor to the hospital and send the transmission that way b/c pt did not use symptom activator. Pt wife verbalized understanding.

## 2015-10-30 ENCOUNTER — Telehealth: Payer: Self-pay | Admitting: Internal Medicine

## 2015-10-30 ENCOUNTER — Encounter: Payer: Self-pay | Admitting: Internal Medicine

## 2015-10-30 DIAGNOSIS — I639 Cerebral infarction, unspecified: Secondary | ICD-10-CM | POA: Diagnosis not present

## 2015-10-30 DIAGNOSIS — R079 Chest pain, unspecified: Secondary | ICD-10-CM | POA: Diagnosis not present

## 2015-10-30 NOTE — Telephone Encounter (Signed)
Eric Marshall, NP called Dr. Rowe Pavy to discuss patient.  Determined that Dr. Rowe Pavy will discuss with Dr. Rayann Heman.  Called patient's wife back to update her regarding this plan.  Patient's wife is appreciative of update and denies additional questions or concerns at this time.

## 2015-10-30 NOTE — Telephone Encounter (Signed)
Eric Tran returned call.  Advised him that patient does not need a post-MRI visit and that we will request a manual transmission from the patient if necessary.  Eric Tran verbalizes understanding of all instructions and denies additional questions.

## 2015-10-30 NOTE — Telephone Encounter (Signed)
Automatic transmission received from device overnight.  Note ~8 sec pause episode on 10/29/15 at 11:43am.   Spoke with patient's wife.  She states that patient is admitted to the 2nd floor at [facility]  She reports that the ED MD felt the patient's symptoms were related to a seizure.  He is to have a CT scan today as he is still experiencing confusion.  His symptoms began around 11:45am.  Spoke with patient's nurse, Arbie Cookey, and faxed strip to [facility] per her request for review by Dr. Rowe Pavy.  Dr. Rayann Heman and Chanetta Marshall, NP made aware.

## 2015-10-30 NOTE — Telephone Encounter (Signed)
New message     Calling to let device clinic know that pt is having an MRI today.  He has a loop recorder.  You may need to see him at a later date for post MRI visit.

## 2015-10-30 NOTE — Telephone Encounter (Signed)
LMOVM requesting call back.  Gave device clinic phone number.

## 2015-10-30 NOTE — Telephone Encounter (Signed)
LMOVM requesting call back from [place] at [facility] MRI department.  Gave device clinic phone number.  No need for post-MRI visit.  Will have patient send manual transmission from [facility] monitor after MRI.

## 2015-10-31 ENCOUNTER — Encounter: Payer: Self-pay | Admitting: *Deleted

## 2015-10-31 ENCOUNTER — Encounter: Payer: Self-pay | Admitting: Cardiology

## 2015-10-31 ENCOUNTER — Ambulatory Visit (INDEPENDENT_AMBULATORY_CARE_PROVIDER_SITE_OTHER): Payer: Medicare Other | Admitting: Cardiology

## 2015-10-31 VITALS — BP 159/103 | HR 74 | Ht 68.0 in | Wt 151.0 lb

## 2015-10-31 DIAGNOSIS — I517 Cardiomegaly: Secondary | ICD-10-CM | POA: Diagnosis not present

## 2015-10-31 DIAGNOSIS — R001 Bradycardia, unspecified: Secondary | ICD-10-CM | POA: Diagnosis not present

## 2015-10-31 DIAGNOSIS — I6310 Cerebral infarction due to embolism of unspecified precerebral artery: Secondary | ICD-10-CM

## 2015-10-31 DIAGNOSIS — E0590 Thyrotoxicosis, unspecified without thyrotoxic crisis or storm: Secondary | ICD-10-CM | POA: Diagnosis not present

## 2015-10-31 DIAGNOSIS — R079 Chest pain, unspecified: Secondary | ICD-10-CM | POA: Diagnosis not present

## 2015-10-31 NOTE — Progress Notes (Signed)
Patient ID: Eric Tran, male   DOB: January 05, 1951, 65 y.o.   MRN: [id number]     Clinical Summary [name] is a 65 y.o.male male seen today for follow up of the following medical problems.   1. Hx of CVA - patient with history of multiple CVAs in the past. According to neurology notes has had a pontine stroke, left cerebellar stroke, bilateral thalamic strokes, a left caudate stroke, right posterior limb of internal capsule stroke, right frontal stroke.  . - based on high concern for possible cardioembolic etiology, patient was originally referred for outpatient cardiac monitoring for possible afib/aflutter. He preferred an implanted loop recorder as opposed to external monitor, this was placed by Dr Eric Tran - denies any recent palpitations since last visit.   2. Left ventricular hypertrophy - dynamic gradient noted on echo, symmetric moderate hypertrophy and hyperdynamic LV is described.   - he is on propanlol for headaches, we increased the dose last visit due to his dynamic gradient, tolerated well.  - propanolol discontinued after recent admission with near syncope and significant pause on loop recorder.   3. OSA - sleep study at [facility] - does not use CPAP machine due to discomfort, has not had refitting. Followed at the [facility]  4. Presyncope - admit 10/29/15 to [facility] with presyncope episode - significant pause noted on loop interrogation. Discussed with Dr Eric Tran during the admission by the primary team, he also updated me on the findings. - propanolol discontinued during recent admission.  - episode occurred around 1130AM while getting ready for doctors appointment. He walked outside to car, while sitting in car became very diaphoretic, limited responsiveness. Remained this way for some time while he was initially evaluated in his pcp's office and then also at follow up.     Past Medical History  Diagnosis Date  . Hyperthyroidism   . Chronic back pain     chronic  narcotic use  . Degenerative disc disease   . Carpal tunnel syndrome   . Arthritis   . Hypertension   . Chronic hepatitis C ([place]) 1980    never had any treatment  . Anxiety   . Depression   . UTI (lower urinary tract infection)   . Stroke [facility]) 2012    left hemiplegia  . Seizures ([place])     Epilepsy  . Head trauma   . TIA (transient ischemic attack)   . Tremor   . BPH (benign prostatic hyperplasia)   . Chronic pain   . Abdominal aortic aneurysm ([place])   . Cerebrovascular disease 10/03/2014  . Abnormality of gait 10/03/2014  . Memory difficulties 10/03/2014  . Partial complex seizure disorder without intractable epilepsy ([place]) 10/03/2014     Allergies  Allergen Reactions  . Keppra [Levetiracetam] Other (See Comments)    Suicidal ideation, anaphylaxsis  . Adhesive [Tape]     Severe rash  . Morphine And Related Nausea And Vomiting    Can only take through IV.     Current Outpatient Prescriptions  Medication Sig Dispense Refill  . ALPRAZolam (XANAX) 0.5 MG tablet Take 1 tablet (0.5 mg total) by mouth 3 (three) times daily as needed for anxiety. For sleep and anxiety (Patient taking differently: Take 0.5 mg by mouth 3 (three) times daily. For sleep and anxiety) 30 tablet 0  . amLODipine (NORVASC) 10 MG tablet Take 5 mg by mouth daily.    . carbidopa-levodopa (SINEMET IR) 10-100 MG per tablet Take 1 tablet by mouth 3 (three) times  daily.    . clopidogrel (PLAVIX) 75 MG tablet Take 75 mg by mouth daily. Reported on 10/03/2015    . divalproex (DEPAKOTE) 500 MG DR tablet Take 500 mg by mouth 2 (two) times daily.    Marland Kitchen gabapentin (NEURONTIN) 600 MG tablet Take 600 mg by mouth 3 (three) times daily.    . Glucosamine-Chondroitin (GLUCOSAMINE CHONDR COMPLEX PO) Take 2 tablets by mouth daily.    . hydrALAZINE (APRESOLINE) 25 MG tablet Take 25 mg by mouth 2 (two) times daily.    Marland Kitchen lisinopril (PRINIVIL,ZESTRIL) 40 MG tablet Take 40 mg by mouth daily.    . memantine (NAMENDA TITRATION PAK) tablet  pack 5 mg/day for =1 week; 5 mg twice daily for =1 week; 15 mg/day given in 5 mg and 10 mg separated doses for =1 week; then 10 mg twice daily 49 tablet 0  . methimazole (TAPAZOLE) 10 MG tablet Take 10 mg by mouth daily.    . mirtazapine (REMERON) 15 MG tablet Take 15 mg by mouth at bedtime.    . Multiple Vitamin (MULTIVITAMIN WITH MINERALS) TABS tablet Take 1 tablet by mouth daily.    . ondansetron (ZOFRAN) 4 MG tablet Take 4 mg by mouth every 8 (eight) hours as needed for nausea or vomiting.    . Oxcarbazepine (TRILEPTAL) 300 MG tablet Take 300 mg by mouth 2 (two) times daily.    . propranolol ER (INDERAL LA) 120 MG 24 hr capsule Take 1 capsule (120 mg total) by mouth daily. 90 capsule 3  . sildenafil (VIAGRA) 50 MG tablet Take 25 mg by mouth daily as needed for erectile dysfunction.     . Tamsulosin HCl (FLOMAX) 0.4 MG CAPS Take 0.4 mg by mouth daily.     Marland Kitchen UNKNOWN TO PATIENT Another seizure medication (received from [place]).     No current facility-administered medications for this visit.     Past Surgical History  Procedure Laterality Date  . Hand surgery      right  . Neck surgery    . Right hand reconstruction    . Ep implantable device N/A 11/15/2014    Procedure: Loop Recorder Insertion;  Surgeon: Eric Grayer, MD;  Location: [place] [facility];  Service: Cardiovascular;  Laterality: N/A;     Allergies  Allergen Reactions  . Keppra [Levetiracetam] Other (See Comments)    Suicidal ideation, anaphylaxsis  . Adhesive [Tape]     Severe rash  . Morphine And Related Nausea And Vomiting    Can only take through IV.      Family History  Problem Relation Age of Onset  . Colon cancer Neg Hx   . Colon polyps Neg Hx   . Cervical cancer Mother   . Lung cancer Sister   . Breast cancer Sister   . Heart failure Father      Social History Eric Tran reports that he has been smoking Cigarettes.  He started smoking about 53 years ago. He has a 4.5 pack-year smoking history. He has  never used smokeless tobacco. Eric Tran reports that he does not drink alcohol.   Review of Systems CONSTITUTIONAL: No weight loss, fever, chills, weakness or fatigue.  HEENT: Eyes: No visual loss, blurred vision, double vision or yellow sclerae.No hearing loss, sneezing, congestion, runny nose or sore throat.  SKIN: No rash or itching.  CARDIOVASCULAR: no chest pain, no palpitations.  RESPIRATORY: No shortness of breath, cough or sputum.  GASTROINTESTINAL: No anorexia, nausea, vomiting or diarrhea. No abdominal pain  or blood.  GENITOURINARY: No burning on urination, no polyuria NEUROLOGICAL: No headache, dizziness, syncope, paralysis, ataxia, numbness or tingling in the extremities. No change in bowel or bladder control.  MUSCULOSKELETAL: No muscle, back pain, joint pain or stiffness.  LYMPHATICS: No enlarged nodes. No history of splenectomy.  PSYCHIATRIC: No history of depression or anxiety.  ENDOCRINOLOGIC: No reports of sweating, cold or heat intolerance. No polyuria or polydipsia.  Marland Kitchen   Physical Examination Filed Vitals:   10/31/15 1317  BP: 159/103  Pulse: 74   Filed Vitals:   10/31/15 1317  Height: 5\' 8"  (1.727 m)  Weight: 151 lb (68.493 kg)    Gen: resting comfortably, no acute distress HEENT: no scleral icterus, pupils equal round and reactive, no palptable cervical adenopathy,  CV: RRR, no m/r/g, no jvd.  Resp: Clear to auscultation bilaterally GI: abdomen is soft, non-tender, non-distended, normal bowel sounds, no hepatosplenomegaly MSK: extremities are warm, no edema.  Skin: warm, no rash Neuro:  no focal deficits Psych: appropriate affect   Diagnostic Studies 10/2014 echo Study Conclusions  - Left ventricle: The cavity size was mildly reduced. Wall thickness was increased in a pattern of moderate LVH. Systolic function was vigorous. The estimated ejection fraction was in the range of 65% to 70%. There is mitral chordal SAM. Resting LVOT gradient  27 mmHg with increase to 77 mmHg with Valsalva consistent with hypertrophic obstructive cardiomyopathy. Wall motion was normal; there were no regional wall motion abnormalities. Doppler parameters are consistent with abnormal left ventricular relaxation (grade 1 diastolic dysfunction). - Aortic valve: Mildly calcified annulus. Trileaflet; mildly calcified leaflets. Peak gradient (S): 27 mm Hg at rest reflecting LVOT flow. - Aortic root: The aortic root was mildly ectatic. - Mitral valve: Calcified annulus. There was trivial regurgitation. - Left atrium: The atrium was at the upper limits of normal in size. - Right atrium: Central venous pressure (est): 3 mm Hg. - Atrial septum: No defect or patent foramen ovale was identified. - Tricuspid valve: There was trivial regurgitation. - Pulmonary arteries: Systolic pressure could not be accurately estimated. - Pericardium, extracardiac: There was no pericardial effusion.    Assessment and Plan  1. Hx of CVA - loop recorder in place, continue to follow for possible significant cardiac arrhthmias that may be associated with his multiple CVAs. Thus far none have been detected  2. Left ventricular hypertrophy - dynamic gradient noted on echo due to symmetric LVH and hyperdynamic LV - off beta blocker due to recent pauses noted on loop recorder and presyncope, follow symptoms.   3. OSA - encouraged to follow at [facility] to reevaluate fitting. Likely playing big part in his generalized fatigue.  4. Presyncope/Bradycardia - episode corresponded with sinus pause on loop recorder, plan discussed with EP Dr Eric Tran and is to follow symptoms and loop reports while off propanolol. In early discussions about a pacemaker patient is fairly against it at this time.  - will check TSH as not done during recent admission.        Arnoldo Lenis, M.D.

## 2015-10-31 NOTE — Patient Instructions (Signed)
   Lab for TSH - order given today. Office will contact with results via phone or letter.   Continue all current medications. Follow up in  3 months.

## 2015-11-12 ENCOUNTER — Ambulatory Visit (INDEPENDENT_AMBULATORY_CARE_PROVIDER_SITE_OTHER): Payer: Medicare Other | Admitting: *Deleted

## 2015-11-12 DIAGNOSIS — I6310 Cerebral infarction due to embolism of unspecified precerebral artery: Secondary | ICD-10-CM

## 2015-11-12 NOTE — Progress Notes (Signed)
Carelink Summary Report / Loop Recorder 

## 2015-11-13 DIAGNOSIS — R634 Abnormal weight loss: Secondary | ICD-10-CM | POA: Diagnosis not present

## 2015-11-13 DIAGNOSIS — F329 Major depressive disorder, single episode, unspecified: Secondary | ICD-10-CM | POA: Diagnosis not present

## 2015-11-23 LAB — CUP PACEART REMOTE DEVICE CHECK: Date Time Interrogation Session: 20170314220635

## 2015-11-25 LAB — CUP PACEART REMOTE DEVICE CHECK: Date Time Interrogation Session: 20170413223621

## 2015-11-25 NOTE — Progress Notes (Signed)
Carelink summary report received. Battery status OK. Normal device function. No new symptom episodes, tachy episodes, brady, or pause episodes. No new AF episodes. Monthly summary reports and ROV/PRN 

## 2015-11-27 DIAGNOSIS — M5412 Radiculopathy, cervical region: Secondary | ICD-10-CM | POA: Diagnosis not present

## 2015-11-27 DIAGNOSIS — G894 Chronic pain syndrome: Secondary | ICD-10-CM | POA: Diagnosis not present

## 2015-11-27 DIAGNOSIS — G890 Central pain syndrome: Secondary | ICD-10-CM | POA: Diagnosis not present

## 2015-11-28 ENCOUNTER — Telehealth: Payer: Self-pay | Admitting: Internal Medicine

## 2015-11-28 NOTE — Telephone Encounter (Signed)
Pt's wife calling regarding episode he had about 12 pm today-had similar episode May 1st-sweating, disorientatiom,SOB, lightheaded and dizzy-tried to send transmission but it wouldn't send, she got an error message, pls advise   [phone] April Stacy

## 2015-11-28 NOTE — Telephone Encounter (Signed)
Spoke with Ms. Scacco- she reports that the patient is napping now and that his BP after initial phone call was 104/58. She cannot get the monitor to transmit- after brief troubleshooting I advised her to call [facility].  Reviewed situation with Dr. Rayann Heman- he advises awaiting transmission for review.

## 2015-11-29 NOTE — Telephone Encounter (Signed)
Called Eric Tran to make her aware that manual transmission from 11/28/15 shows no arrhythmias or abnormalities.  She verbalizes understanding of information and denies additional questions or concerns at this time.

## 2015-11-30 DIAGNOSIS — R221 Localized swelling, mass and lump, neck: Secondary | ICD-10-CM | POA: Diagnosis not present

## 2015-11-30 DIAGNOSIS — R079 Chest pain, unspecified: Secondary | ICD-10-CM | POA: Diagnosis not present

## 2015-11-30 DIAGNOSIS — I6522 Occlusion and stenosis of left carotid artery: Secondary | ICD-10-CM | POA: Diagnosis not present

## 2015-12-10 ENCOUNTER — Ambulatory Visit (INDEPENDENT_AMBULATORY_CARE_PROVIDER_SITE_OTHER): Payer: Medicare Other | Admitting: *Deleted

## 2015-12-10 DIAGNOSIS — I6310 Cerebral infarction due to embolism of unspecified precerebral artery: Secondary | ICD-10-CM

## 2015-12-11 DIAGNOSIS — I6523 Occlusion and stenosis of bilateral carotid arteries: Secondary | ICD-10-CM | POA: Diagnosis not present

## 2015-12-11 DIAGNOSIS — I2510 Atherosclerotic heart disease of native coronary artery without angina pectoris: Secondary | ICD-10-CM | POA: Diagnosis not present

## 2015-12-11 NOTE — Progress Notes (Signed)
Carelink Summary Report / Loop Recorder 

## 2015-12-14 DIAGNOSIS — Z87820 Personal history of traumatic brain injury: Secondary | ICD-10-CM | POA: Diagnosis not present

## 2015-12-14 DIAGNOSIS — S20229A Contusion of unspecified back wall of thorax, initial encounter: Secondary | ICD-10-CM | POA: Diagnosis not present

## 2015-12-18 ENCOUNTER — Telehealth: Payer: Self-pay | Admitting: *Deleted

## 2015-12-18 ENCOUNTER — Other Ambulatory Visit: Payer: Self-pay | Admitting: Neurology

## 2015-12-18 LAB — CUP PACEART REMOTE DEVICE CHECK: Date Time Interrogation Session: 20170513223932

## 2015-12-18 NOTE — Telephone Encounter (Signed)
Pt's wife need to speak to [facility] or Dr. Jannifer Franklin. Please advise [phone]
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
## 2015-12-19 ENCOUNTER — Other Ambulatory Visit: Payer: Self-pay

## 2015-12-19 MED ORDER — MEMANTINE HCL 10 MG PO TABS: 10.0000 mg | ORAL_TABLET | Freq: Two times a day (BID) | ORAL | Status: DC

## 2015-12-19 NOTE — Telephone Encounter (Signed)
Refill resent for 90 days of 10 mg tabs

## 2015-12-27 ENCOUNTER — Encounter: Payer: Self-pay | Admitting: Neurology

## 2015-12-27 ENCOUNTER — Ambulatory Visit (INDEPENDENT_AMBULATORY_CARE_PROVIDER_SITE_OTHER): Payer: Medicare Other | Admitting: Neurology

## 2015-12-27 VITALS — BP 106/64 | HR 86 | Ht 68.0 in | Wt 151.5 lb

## 2015-12-27 DIAGNOSIS — G890 Central pain syndrome: Secondary | ICD-10-CM | POA: Diagnosis not present

## 2015-12-27 DIAGNOSIS — Z5181 Encounter for therapeutic drug level monitoring: Secondary | ICD-10-CM | POA: Diagnosis not present

## 2015-12-27 DIAGNOSIS — G40209 Localization-related (focal) (partial) symptomatic epilepsy and epileptic syndromes with complex partial seizures, not intractable, without status epilepticus: Secondary | ICD-10-CM

## 2015-12-27 DIAGNOSIS — R269 Unspecified abnormalities of gait and mobility: Secondary | ICD-10-CM | POA: Diagnosis not present

## 2015-12-27 DIAGNOSIS — M5412 Radiculopathy, cervical region: Secondary | ICD-10-CM | POA: Diagnosis not present

## 2015-12-27 DIAGNOSIS — F411 Generalized anxiety disorder: Secondary | ICD-10-CM | POA: Diagnosis not present

## 2015-12-27 DIAGNOSIS — G894 Chronic pain syndrome: Secondary | ICD-10-CM | POA: Diagnosis not present

## 2015-12-27 NOTE — Patient Instructions (Signed)
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
## 2015-12-30 LAB — CBC WITH DIFFERENTIAL/PLATELET
Basophils Absolute: 0 10*3/uL (ref 0.0–0.2)
Basos: 0 %
EOS (ABSOLUTE): 0.1 10*3/uL (ref 0.0–0.4)
Eos: 1 %
Hematocrit: 40.5 % (ref 37.5–51.0)
Hemoglobin: 13.8 g/dL (ref 12.6–17.7)
Immature Grans (Abs): 0 10*3/uL (ref 0.0–0.1)
Immature Granulocytes: 0 %
Lymphocytes Absolute: 2.7 10*3/uL (ref 0.7–3.1)
Lymphs: 33 %
MCH: 31.2 pg (ref 26.6–33.0)
MCHC: 34.1 g/dL (ref 31.5–35.7)
MCV: 92 fL (ref 79–97)
Monocytes Absolute: 0.8 10*3/uL (ref 0.1–0.9)
Monocytes: 10 %
Neutrophils Absolute: 4.5 10*3/uL (ref 1.4–7.0)
Neutrophils: 56 %
Platelets: 180 10*3/uL (ref 150–379)
RBC: 4.42 x10E6/uL (ref 4.14–5.80)
RDW: 13.3 % (ref 12.3–15.4)
WBC: 8.1 10*3/uL (ref 3.4–10.8)

## 2015-12-30 LAB — COMPREHENSIVE METABOLIC PANEL
ALT: 6 IU/L (ref 0–44)
AST: 13 IU/L (ref 0–40)
Albumin/Globulin Ratio: 1.7 (ref 1.2–2.2)
Albumin: 3.9 g/dL (ref 3.6–4.8)
Alkaline Phosphatase: 56 IU/L (ref 39–117)
BUN/Creatinine Ratio: 10 (ref 10–24)
BUN: 9 mg/dL (ref 8–27)
Bilirubin Total: 0.8 mg/dL (ref 0.0–1.2)
CO2: 25 mmol/L (ref 18–29)
Calcium: 9 mg/dL (ref 8.6–10.2)
Chloride: 101 mmol/L (ref 96–106)
Creatinine, Ser: 0.93 mg/dL (ref 0.76–1.27)
GFR calc Af Amer: 99 mL/min/{1.73_m2} (ref >59)
GFR calc non Af Amer: 86 mL/min/{1.73_m2} (ref >59)
Globulin, Total: 2.3 g/dL (ref 1.5–4.5)
Glucose: 154 mg/dL — ABNORMAL HIGH (ref 65–99)
Potassium: 4.2 mmol/L (ref 3.5–5.2)
Sodium: 143 mmol/L (ref 134–144)
Total Protein: 6.2 g/dL (ref 6.0–8.5)

## 2015-12-30 LAB — AMMONIA: Ammonia: 110 ug/dL — ABNORMAL HIGH (ref 27–102)

## 2015-12-30 LAB — 10-HYDROXYCARBAZEPINE: Oxcarbazepine SerPl-Mcnc: 11 ug/mL (ref 10–35)

## 2015-12-31 ENCOUNTER — Telehealth: Payer: Self-pay

## 2015-12-31 NOTE — Telephone Encounter (Signed)
Called pt w/ unremarkable lab results except slightly elevated but stable ammonia level. Instructed to continue Trileptal dose as taking previously. May call back w/ further questions/concerns.

## 2015-12-31 NOTE — Telephone Encounter (Signed)
-----   Message from Kathrynn Ducking, MD sent at 12/30/2015  2:25 PM EDT ----- The blood work is unremarkable with the exception that the ammonia level is slightly high, similar to what has been seen in the past. Will need to follow. The Trileptal level is therapeutic. No change in dosing. Please call the patient. ----- Message -----    From: [facility] Lab Results In Interface    Sent: 12/28/2015   7:43 AM      To: Kathrynn Ducking, MD

## 2016-01-03 LAB — CUP PACEART REMOTE DEVICE CHECK: Date Time Interrogation Session: 20170612224035

## 2016-01-04 DIAGNOSIS — B182 Chronic viral hepatitis C: Secondary | ICD-10-CM | POA: Diagnosis not present

## 2016-01-04 DIAGNOSIS — R4182 Altered mental status, unspecified: Secondary | ICD-10-CM | POA: Diagnosis not present

## 2016-01-04 DIAGNOSIS — R634 Abnormal weight loss: Secondary | ICD-10-CM | POA: Diagnosis not present

## 2016-01-04 DIAGNOSIS — R32 Unspecified urinary incontinence: Secondary | ICD-10-CM | POA: Diagnosis not present

## 2016-01-04 DIAGNOSIS — G459 Transient cerebral ischemic attack, unspecified: Secondary | ICD-10-CM | POA: Diagnosis not present

## 2016-01-04 DIAGNOSIS — G40919 Epilepsy, unspecified, intractable, without status epilepticus: Secondary | ICD-10-CM | POA: Diagnosis not present

## 2016-01-08 ENCOUNTER — Telehealth: Payer: Self-pay

## 2016-01-08 DIAGNOSIS — G40919 Epilepsy, unspecified, intractable, without status epilepticus: Secondary | ICD-10-CM | POA: Diagnosis not present

## 2016-01-08 DIAGNOSIS — R4182 Altered mental status, unspecified: Secondary | ICD-10-CM | POA: Diagnosis not present

## 2016-01-08 NOTE — Telephone Encounter (Signed)
Received call from pt's wife asking for pt's most recent ammonia level in order to give to [place]. Lab results from 12/27/15 faxed to Dr. Arcola Jansky PA Joellen Jersey for reference. Mrs. Breese reports that pt has had recent changes including more frequent incontinence w/ dark urine. Says that ammonia level was re-checked by PCP this week and was 58. Urine specimen was obtained and [facility]. She agreed to call back w/ update on pt and w/ any additional questions/concerns.

## 2016-01-09 ENCOUNTER — Ambulatory Visit (INDEPENDENT_AMBULATORY_CARE_PROVIDER_SITE_OTHER): Payer: Medicare Other | Admitting: *Deleted

## 2016-01-09 DIAGNOSIS — I6310 Cerebral infarction due to embolism of unspecified precerebral artery: Secondary | ICD-10-CM

## 2016-01-09 NOTE — Telephone Encounter (Signed)
Patent's wife is calling back regarding the patient.

## 2016-01-09 NOTE — Telephone Encounter (Addendum)
Left VM mssg for pt/wife to return call.

## 2016-01-10 NOTE — Telephone Encounter (Signed)
I called the family. The patient is still having some drowsiness, the ammonia level on several checks his been mildly elevated, we will reduce the Depakote to 500 mg daily. They will contact us if this does not seem to improve his symptoms over time. The patient is also on Trileptal.

## 2016-01-10 NOTE — Telephone Encounter (Signed)
Pt's wife called back reporting that pt is still having confusion and lethargy. She talked to [place] who feels that symptoms are related to pt's pain medication. UA was negative. Ammonia level 58. Home health has discharged pt but wife feels that home care is still needed. PCP is working on getting it re-started. Says that the only other recent change was tapering up on Namenda dose (10 mg BID). Would like to check w/ Dr. Jannifer Franklin about stopping this if he feels it is contributing to confusion/sleepiness.  PCP:  [facility] [address] [place], [place] [address] Phone: [phone] Fax: [phone]

## 2016-01-10 NOTE — Telephone Encounter (Signed)
Tried to reach pt's wife again today w/o success. Left mssg and she may call back w/ update on pt or w/ additional questions/concerns.

## 2016-01-10 NOTE — Progress Notes (Signed)
Carelink Summary Report / Loop Recorder 

## 2016-01-11 DIAGNOSIS — R4781 Slurred speech: Secondary | ICD-10-CM | POA: Diagnosis not present

## 2016-01-11 DIAGNOSIS — R2681 Unsteadiness on feet: Secondary | ICD-10-CM | POA: Diagnosis not present

## 2016-01-11 DIAGNOSIS — R4182 Altered mental status, unspecified: Secondary | ICD-10-CM | POA: Diagnosis not present

## 2016-01-11 DIAGNOSIS — R5383 Other fatigue: Secondary | ICD-10-CM | POA: Diagnosis not present

## 2016-01-11 DIAGNOSIS — J449 Chronic obstructive pulmonary disease, unspecified: Secondary | ICD-10-CM | POA: Diagnosis not present

## 2016-01-11 DIAGNOSIS — Z9181 History of falling: Secondary | ICD-10-CM | POA: Diagnosis not present

## 2016-01-11 DIAGNOSIS — G40919 Epilepsy, unspecified, intractable, without status epilepticus: Secondary | ICD-10-CM | POA: Diagnosis not present

## 2016-01-11 DIAGNOSIS — Z7902 Long term (current) use of antithrombotics/antiplatelets: Secondary | ICD-10-CM | POA: Diagnosis not present

## 2016-01-11 DIAGNOSIS — Z8673 Personal history of transient ischemic attack (TIA), and cerebral infarction without residual deficits: Secondary | ICD-10-CM | POA: Diagnosis not present

## 2016-01-11 DIAGNOSIS — Z79891 Long term (current) use of opiate analgesic: Secondary | ICD-10-CM | POA: Diagnosis not present

## 2016-01-15 DIAGNOSIS — Z720 Tobacco use: Secondary | ICD-10-CM | POA: Diagnosis not present

## 2016-01-15 DIAGNOSIS — F329 Major depressive disorder, single episode, unspecified: Secondary | ICD-10-CM | POA: Diagnosis not present

## 2016-01-15 DIAGNOSIS — Z87820 Personal history of traumatic brain injury: Secondary | ICD-10-CM | POA: Diagnosis not present

## 2016-01-15 DIAGNOSIS — M25511 Pain in right shoulder: Secondary | ICD-10-CM | POA: Diagnosis not present

## 2016-01-16 ENCOUNTER — Encounter: Payer: Self-pay | Admitting: Internal Medicine

## 2016-01-16 DIAGNOSIS — M5412 Radiculopathy, cervical region: Secondary | ICD-10-CM | POA: Diagnosis not present

## 2016-01-16 DIAGNOSIS — G890 Central pain syndrome: Secondary | ICD-10-CM | POA: Diagnosis not present

## 2016-01-16 DIAGNOSIS — G894 Chronic pain syndrome: Secondary | ICD-10-CM | POA: Diagnosis not present

## 2016-01-17 DIAGNOSIS — R5383 Other fatigue: Secondary | ICD-10-CM | POA: Diagnosis not present

## 2016-01-17 DIAGNOSIS — R2681 Unsteadiness on feet: Secondary | ICD-10-CM | POA: Diagnosis not present

## 2016-01-17 DIAGNOSIS — R4781 Slurred speech: Secondary | ICD-10-CM | POA: Diagnosis not present

## 2016-01-17 DIAGNOSIS — Z79891 Long term (current) use of opiate analgesic: Secondary | ICD-10-CM | POA: Diagnosis not present

## 2016-01-17 DIAGNOSIS — G40919 Epilepsy, unspecified, intractable, without status epilepticus: Secondary | ICD-10-CM | POA: Diagnosis not present

## 2016-01-17 DIAGNOSIS — Z9181 History of falling: Secondary | ICD-10-CM | POA: Diagnosis not present

## 2016-01-17 DIAGNOSIS — J449 Chronic obstructive pulmonary disease, unspecified: Secondary | ICD-10-CM | POA: Diagnosis not present

## 2016-01-17 DIAGNOSIS — R4182 Altered mental status, unspecified: Secondary | ICD-10-CM | POA: Diagnosis not present

## 2016-01-17 DIAGNOSIS — Z7902 Long term (current) use of antithrombotics/antiplatelets: Secondary | ICD-10-CM | POA: Diagnosis not present

## 2016-01-17 DIAGNOSIS — Z8673 Personal history of transient ischemic attack (TIA), and cerebral infarction without residual deficits: Secondary | ICD-10-CM | POA: Diagnosis not present

## 2016-01-18 DIAGNOSIS — Z79891 Long term (current) use of opiate analgesic: Secondary | ICD-10-CM | POA: Diagnosis not present

## 2016-01-18 DIAGNOSIS — G40919 Epilepsy, unspecified, intractable, without status epilepticus: Secondary | ICD-10-CM | POA: Diagnosis not present

## 2016-01-18 DIAGNOSIS — Z7902 Long term (current) use of antithrombotics/antiplatelets: Secondary | ICD-10-CM | POA: Diagnosis not present

## 2016-01-18 DIAGNOSIS — R5383 Other fatigue: Secondary | ICD-10-CM | POA: Diagnosis not present

## 2016-01-18 DIAGNOSIS — Z8673 Personal history of transient ischemic attack (TIA), and cerebral infarction without residual deficits: Secondary | ICD-10-CM | POA: Diagnosis not present

## 2016-01-18 DIAGNOSIS — R4182 Altered mental status, unspecified: Secondary | ICD-10-CM | POA: Diagnosis not present

## 2016-01-18 DIAGNOSIS — R4781 Slurred speech: Secondary | ICD-10-CM | POA: Diagnosis not present

## 2016-01-18 DIAGNOSIS — Z9181 History of falling: Secondary | ICD-10-CM | POA: Diagnosis not present

## 2016-01-18 DIAGNOSIS — R2681 Unsteadiness on feet: Secondary | ICD-10-CM | POA: Diagnosis not present

## 2016-01-18 DIAGNOSIS — J449 Chronic obstructive pulmonary disease, unspecified: Secondary | ICD-10-CM | POA: Diagnosis not present

## 2016-01-19 LAB — CUP PACEART REMOTE DEVICE CHECK: Date Time Interrogation Session: 20170712230843

## 2016-01-21 DIAGNOSIS — G40919 Epilepsy, unspecified, intractable, without status epilepticus: Secondary | ICD-10-CM | POA: Diagnosis not present

## 2016-01-21 DIAGNOSIS — R5383 Other fatigue: Secondary | ICD-10-CM | POA: Diagnosis not present

## 2016-01-21 DIAGNOSIS — Z9181 History of falling: Secondary | ICD-10-CM | POA: Diagnosis not present

## 2016-01-21 DIAGNOSIS — Z79891 Long term (current) use of opiate analgesic: Secondary | ICD-10-CM | POA: Diagnosis not present

## 2016-01-21 DIAGNOSIS — R2681 Unsteadiness on feet: Secondary | ICD-10-CM | POA: Diagnosis not present

## 2016-01-21 DIAGNOSIS — Z7902 Long term (current) use of antithrombotics/antiplatelets: Secondary | ICD-10-CM | POA: Diagnosis not present

## 2016-01-21 DIAGNOSIS — Z8673 Personal history of transient ischemic attack (TIA), and cerebral infarction without residual deficits: Secondary | ICD-10-CM | POA: Diagnosis not present

## 2016-01-21 DIAGNOSIS — R4182 Altered mental status, unspecified: Secondary | ICD-10-CM | POA: Diagnosis not present

## 2016-01-21 DIAGNOSIS — R4781 Slurred speech: Secondary | ICD-10-CM | POA: Diagnosis not present

## 2016-01-21 DIAGNOSIS — J449 Chronic obstructive pulmonary disease, unspecified: Secondary | ICD-10-CM | POA: Diagnosis not present

## 2016-01-22 DIAGNOSIS — G40919 Epilepsy, unspecified, intractable, without status epilepticus: Secondary | ICD-10-CM | POA: Diagnosis not present

## 2016-01-22 DIAGNOSIS — Z8673 Personal history of transient ischemic attack (TIA), and cerebral infarction without residual deficits: Secondary | ICD-10-CM | POA: Diagnosis not present

## 2016-01-22 DIAGNOSIS — R4781 Slurred speech: Secondary | ICD-10-CM | POA: Diagnosis not present

## 2016-01-22 DIAGNOSIS — R2681 Unsteadiness on feet: Secondary | ICD-10-CM | POA: Diagnosis not present

## 2016-01-22 DIAGNOSIS — J449 Chronic obstructive pulmonary disease, unspecified: Secondary | ICD-10-CM | POA: Diagnosis not present

## 2016-01-22 DIAGNOSIS — R4182 Altered mental status, unspecified: Secondary | ICD-10-CM | POA: Diagnosis not present

## 2016-01-22 DIAGNOSIS — Z7902 Long term (current) use of antithrombotics/antiplatelets: Secondary | ICD-10-CM | POA: Diagnosis not present

## 2016-01-22 DIAGNOSIS — R5383 Other fatigue: Secondary | ICD-10-CM | POA: Diagnosis not present

## 2016-01-22 DIAGNOSIS — Z79891 Long term (current) use of opiate analgesic: Secondary | ICD-10-CM | POA: Diagnosis not present

## 2016-01-22 DIAGNOSIS — Z9181 History of falling: Secondary | ICD-10-CM | POA: Diagnosis not present

## 2016-01-23 DIAGNOSIS — Z8673 Personal history of transient ischemic attack (TIA), and cerebral infarction without residual deficits: Secondary | ICD-10-CM | POA: Diagnosis not present

## 2016-01-23 DIAGNOSIS — Z79891 Long term (current) use of opiate analgesic: Secondary | ICD-10-CM | POA: Diagnosis not present

## 2016-01-23 DIAGNOSIS — Z7902 Long term (current) use of antithrombotics/antiplatelets: Secondary | ICD-10-CM | POA: Diagnosis not present

## 2016-01-23 DIAGNOSIS — G40919 Epilepsy, unspecified, intractable, without status epilepticus: Secondary | ICD-10-CM | POA: Diagnosis not present

## 2016-01-23 DIAGNOSIS — Z9181 History of falling: Secondary | ICD-10-CM | POA: Diagnosis not present

## 2016-01-23 DIAGNOSIS — R5383 Other fatigue: Secondary | ICD-10-CM | POA: Diagnosis not present

## 2016-01-23 DIAGNOSIS — R2681 Unsteadiness on feet: Secondary | ICD-10-CM | POA: Diagnosis not present

## 2016-01-23 DIAGNOSIS — R4781 Slurred speech: Secondary | ICD-10-CM | POA: Diagnosis not present

## 2016-01-23 DIAGNOSIS — J449 Chronic obstructive pulmonary disease, unspecified: Secondary | ICD-10-CM | POA: Diagnosis not present

## 2016-01-23 DIAGNOSIS — R4182 Altered mental status, unspecified: Secondary | ICD-10-CM | POA: Diagnosis not present

## 2016-01-24 DIAGNOSIS — R2681 Unsteadiness on feet: Secondary | ICD-10-CM | POA: Diagnosis not present

## 2016-01-24 DIAGNOSIS — G40919 Epilepsy, unspecified, intractable, without status epilepticus: Secondary | ICD-10-CM | POA: Diagnosis not present

## 2016-01-24 DIAGNOSIS — Z8673 Personal history of transient ischemic attack (TIA), and cerebral infarction without residual deficits: Secondary | ICD-10-CM | POA: Diagnosis not present

## 2016-01-24 DIAGNOSIS — R4781 Slurred speech: Secondary | ICD-10-CM | POA: Diagnosis not present

## 2016-01-24 DIAGNOSIS — R5383 Other fatigue: Secondary | ICD-10-CM | POA: Diagnosis not present

## 2016-01-24 DIAGNOSIS — R4182 Altered mental status, unspecified: Secondary | ICD-10-CM | POA: Diagnosis not present

## 2016-01-24 DIAGNOSIS — Z7902 Long term (current) use of antithrombotics/antiplatelets: Secondary | ICD-10-CM | POA: Diagnosis not present

## 2016-01-24 DIAGNOSIS — Z9181 History of falling: Secondary | ICD-10-CM | POA: Diagnosis not present

## 2016-01-24 DIAGNOSIS — Z79891 Long term (current) use of opiate analgesic: Secondary | ICD-10-CM | POA: Diagnosis not present

## 2016-01-24 DIAGNOSIS — J449 Chronic obstructive pulmonary disease, unspecified: Secondary | ICD-10-CM | POA: Diagnosis not present

## 2016-01-28 DIAGNOSIS — Z8673 Personal history of transient ischemic attack (TIA), and cerebral infarction without residual deficits: Secondary | ICD-10-CM | POA: Diagnosis not present

## 2016-01-28 DIAGNOSIS — Z79891 Long term (current) use of opiate analgesic: Secondary | ICD-10-CM | POA: Diagnosis not present

## 2016-01-28 DIAGNOSIS — R4182 Altered mental status, unspecified: Secondary | ICD-10-CM | POA: Diagnosis not present

## 2016-01-28 DIAGNOSIS — R5383 Other fatigue: Secondary | ICD-10-CM | POA: Diagnosis not present

## 2016-01-28 DIAGNOSIS — Z7902 Long term (current) use of antithrombotics/antiplatelets: Secondary | ICD-10-CM | POA: Diagnosis not present

## 2016-01-28 DIAGNOSIS — R4781 Slurred speech: Secondary | ICD-10-CM | POA: Diagnosis not present

## 2016-01-28 DIAGNOSIS — R2681 Unsteadiness on feet: Secondary | ICD-10-CM | POA: Diagnosis not present

## 2016-01-28 DIAGNOSIS — G40919 Epilepsy, unspecified, intractable, without status epilepticus: Secondary | ICD-10-CM | POA: Diagnosis not present

## 2016-01-28 DIAGNOSIS — Z9181 History of falling: Secondary | ICD-10-CM | POA: Diagnosis not present

## 2016-01-28 DIAGNOSIS — J449 Chronic obstructive pulmonary disease, unspecified: Secondary | ICD-10-CM | POA: Diagnosis not present

## 2016-01-29 DIAGNOSIS — Z9181 History of falling: Secondary | ICD-10-CM | POA: Diagnosis not present

## 2016-01-29 DIAGNOSIS — Z8673 Personal history of transient ischemic attack (TIA), and cerebral infarction without residual deficits: Secondary | ICD-10-CM | POA: Diagnosis not present

## 2016-01-29 DIAGNOSIS — Z79891 Long term (current) use of opiate analgesic: Secondary | ICD-10-CM | POA: Diagnosis not present

## 2016-01-29 DIAGNOSIS — J449 Chronic obstructive pulmonary disease, unspecified: Secondary | ICD-10-CM | POA: Diagnosis not present

## 2016-01-29 DIAGNOSIS — R5383 Other fatigue: Secondary | ICD-10-CM | POA: Diagnosis not present

## 2016-01-29 DIAGNOSIS — R4182 Altered mental status, unspecified: Secondary | ICD-10-CM | POA: Diagnosis not present

## 2016-01-29 DIAGNOSIS — R4781 Slurred speech: Secondary | ICD-10-CM | POA: Diagnosis not present

## 2016-01-29 DIAGNOSIS — R2681 Unsteadiness on feet: Secondary | ICD-10-CM | POA: Diagnosis not present

## 2016-01-29 DIAGNOSIS — Z7902 Long term (current) use of antithrombotics/antiplatelets: Secondary | ICD-10-CM | POA: Diagnosis not present

## 2016-01-29 DIAGNOSIS — G40919 Epilepsy, unspecified, intractable, without status epilepticus: Secondary | ICD-10-CM | POA: Diagnosis not present

## 2016-01-30 DIAGNOSIS — R5383 Other fatigue: Secondary | ICD-10-CM | POA: Diagnosis not present

## 2016-01-30 DIAGNOSIS — J449 Chronic obstructive pulmonary disease, unspecified: Secondary | ICD-10-CM | POA: Diagnosis not present

## 2016-01-30 DIAGNOSIS — R4781 Slurred speech: Secondary | ICD-10-CM | POA: Diagnosis not present

## 2016-01-30 DIAGNOSIS — G40919 Epilepsy, unspecified, intractable, without status epilepticus: Secondary | ICD-10-CM | POA: Diagnosis not present

## 2016-01-30 DIAGNOSIS — R4182 Altered mental status, unspecified: Secondary | ICD-10-CM | POA: Diagnosis not present

## 2016-01-30 DIAGNOSIS — Z9181 History of falling: Secondary | ICD-10-CM | POA: Diagnosis not present

## 2016-01-30 DIAGNOSIS — R2681 Unsteadiness on feet: Secondary | ICD-10-CM | POA: Diagnosis not present

## 2016-01-30 DIAGNOSIS — Z7902 Long term (current) use of antithrombotics/antiplatelets: Secondary | ICD-10-CM | POA: Diagnosis not present

## 2016-01-30 DIAGNOSIS — Z8673 Personal history of transient ischemic attack (TIA), and cerebral infarction without residual deficits: Secondary | ICD-10-CM | POA: Diagnosis not present

## 2016-01-30 DIAGNOSIS — Z79891 Long term (current) use of opiate analgesic: Secondary | ICD-10-CM | POA: Diagnosis not present

## 2016-01-31 ENCOUNTER — Ambulatory Visit (INDEPENDENT_AMBULATORY_CARE_PROVIDER_SITE_OTHER): Payer: Medicare Other | Admitting: Cardiology

## 2016-01-31 ENCOUNTER — Encounter: Payer: Self-pay | Admitting: *Deleted

## 2016-01-31 ENCOUNTER — Encounter: Payer: Self-pay | Admitting: Cardiology

## 2016-01-31 VITALS — BP 117/84 | HR 90 | Ht 68.0 in | Wt 138.0 lb

## 2016-01-31 DIAGNOSIS — R0602 Shortness of breath: Secondary | ICD-10-CM

## 2016-01-31 DIAGNOSIS — R001 Bradycardia, unspecified: Secondary | ICD-10-CM

## 2016-01-31 DIAGNOSIS — I517 Cardiomegaly: Secondary | ICD-10-CM | POA: Diagnosis not present

## 2016-01-31 MED ORDER — TIOTROPIUM BROMIDE MONOHYDRATE 18 MCG IN CAPS: 18.0000 ug | ORAL_CAPSULE | Freq: Every day | RESPIRATORY_TRACT | 6 refills | Status: DC

## 2016-01-31 MED ORDER — ALBUTEROL SULFATE HFA 108 (90 BASE) MCG/ACT IN AERS: 2.0000 | INHALATION_SPRAY | Freq: Four times a day (QID) | RESPIRATORY_TRACT | 2 refills | Status: DC | PRN

## 2016-01-31 NOTE — Patient Instructions (Addendum)
Your physician wants you to follow-up in: [place] DR. BRANCH You will receive a reminder letter in the mail two months in advance. If you don't receive a letter, please call our office to schedule the follow-up appointment.  Your physician has recommended you make the following change in your medication:   START SPIRIVA 18 MCG DAILY  START ALBUTEROL INHALER 1-2 PUFFS EVERY 6 HOURS AS NEEDED  Your physician has recommended that you have a pulmonary function test. Pulmonary Function Tests are a group of tests that measure how well air moves in and out of your lungs.   Thank you for choosing [facility]!!

## 2016-01-31 NOTE — Progress Notes (Addendum)
Clinical Summary Eric Tran is a 65 y.o.male seen today for follow up of the following medical problems.   1. Hx of CVA - patient with history of multiple CVAs in the past. According to neurology notes has had a pontine stroke, left cerebellar stroke, bilateral thalamic strokes, a left caudate stroke, right posterior limb of internal capsule stroke, right frontal stroke.  . - based on high concern for possible cardioembolic etiology, patient was originally referred for outpatient cardiac monitoring for possible afib/aflutter. He preferred an implanted loop recorder as opposed to external monitor, this was placed by Dr Rayann Heman  - 12/2015 last loop recorder check, no significant arrhythmias   2. Left ventricular hypertrophy - dynamic gradient noted on echo, symmetric moderate hypertrophy and hyperdynamic LV is described.   - he is on propanlol for headaches, we increased the dose last visit due to his dynamic gradient, tolerated well.   - propanolol discontinued after recent admission with near syncope and significant pause on loop recorder.  - chronic SOB unchanged. No significant chest pain, no syncope.   3. OSA - sleep study at [facility] - does not use CPAP machine due to discomfort, has not had refitting. Followed at the [facility]  4. Presyncope - admit 10/29/15 to [facility] with presyncope episode - significant pause noted on loop interrogation. Discussed with Dr Rayann Heman during the admission by the primary team, he also updated me on the findings. - propanolol discontinued during recent admission.  - episode occurred around 1130AM while getting ready for doctors appointment. He walked outside to car, while sitting in car became very diaphoretic, limited responsiveness. Remained this way for some time while he was initially evaluated in his pcp's office and then also at follow up.   - no recurrent episodes since last visit.    5. Tobacco history - long smoking history. Has  chronic SOB, cough wheezing. He denies ever having PFTs done, but had been on albuterol and spiriva at some point in the past.   Past Medical History:  Diagnosis Date  . Abdominal aortic aneurysm ([place])   . Abnormality of gait 10/03/2014  . Anxiety   . Arthritis   . BPH (benign prostatic hyperplasia)   . Carpal tunnel syndrome   . Cerebrovascular disease 10/03/2014  . Chronic back pain    chronic narcotic use  . Chronic hepatitis C ([place]) 1980   never had any treatment  . Chronic pain   . Degenerative disc disease   . Depression   . Head trauma   . Hypertension   . Hyperthyroidism   . Memory difficulties 10/03/2014  . Partial complex seizure disorder without intractable epilepsy ([place]) 10/03/2014  . Seizures ([place])    Epilepsy  . Stroke [facility]) 2012   left hemiplegia  . TIA (transient ischemic attack)   . Tremor   . UTI (lower urinary tract infection)      Allergies  Allergen Reactions  . Keppra [Levetiracetam] Other (See Comments)    Suicidal ideation, anaphylaxsis  . Adhesive [Tape]     Severe rash  . Morphine And Related Nausea And Vomiting    Can only take through IV.     Current Outpatient Prescriptions  Medication Sig Dispense Refill  . ALPRAZolam (XANAX) 0.25 MG tablet Take 0.25 mg by mouth 3 (three) times daily as needed for anxiety.    . baclofen (LIORESAL) 10 MG tablet Take 1 tablet by mouth 3 (three) times daily.    . carbidopa-levodopa (SINEMET IR)  25-100 MG tablet Take 1 tablet by mouth 3 (three) times daily.    . clopidogrel (PLAVIX) 75 MG tablet Take 75 mg by mouth daily. Reported on 10/03/2015    . divalproex (DEPAKOTE) 500 MG DR tablet Take 500 mg by mouth 2 (two) times daily. Pt taking 500 mg daily    . hydrALAZINE (APRESOLINE) 25 MG tablet Take 25 mg by mouth 2 (two) times daily.    Marland Kitchen lisinopril (PRINIVIL,ZESTRIL) 40 MG tablet Take 40 mg by mouth daily.    . memantine (NAMENDA) 10 MG tablet Take 1 tablet (10 mg total) by mouth 2 (two) times daily. 180 tablet 3    . methimazole (TAPAZOLE) 10 MG tablet Take 5 mg by mouth daily.     Marland Kitchen morphine (MS CONTIN) 30 MG 12 hr tablet Take 30 mg by mouth every 12 (twelve) hours.     . ondansetron (ZOFRAN) 4 MG tablet Take 4 mg by mouth every 8 (eight) hours as needed for nausea or vomiting.    . Oxcarbazepine (TRILEPTAL) 300 MG tablet Take 300 mg by mouth 2 (two) times daily.    . Oxycodone HCl 10 MG TABS     . pregabalin (LYRICA) 150 MG capsule     . sildenafil (VIAGRA) 50 MG tablet Take 25 mg by mouth daily as needed for erectile dysfunction.     . Tamsulosin HCl (FLOMAX) 0.4 MG CAPS Take 0.4 mg by mouth daily.      No current facility-administered medications for this visit.      Past Surgical History:  Procedure Laterality Date  . EP IMPLANTABLE DEVICE N/A 11/15/2014   Procedure: Loop Recorder Insertion;  Surgeon: Thompson Grayer, MD;  Location: [facility];  Service: Cardiovascular;  Laterality: N/A;  . HAND SURGERY     right  . NECK SURGERY    . right hand reconstruction       Allergies  Allergen Reactions  . Keppra [Levetiracetam] Other (See Comments)    Suicidal ideation, anaphylaxsis  . Adhesive [Tape]     Severe rash  . Morphine And Related Nausea And Vomiting    Can only take through IV.      Family History  Problem Relation Age of Onset  . Colon cancer Neg Hx   . Colon polyps Neg Hx   . Cervical cancer Mother   . Lung cancer Sister   . Breast cancer Sister   . Heart failure Father      Social History Eric Tran reports that he has been smoking Cigarettes.  He started smoking about 53 years ago. He has a 4.50 pack-year smoking history. He has never used smokeless tobacco. Eric Tran reports that he does not drink alcohol.   Review of Systems CONSTITUTIONAL: No weight loss, fever, chills, weakness or fatigue.  HEENT: Eyes: No visual loss, blurred vision, double vision or yellow sclerae.No hearing loss, sneezing, congestion, runny nose or sore throat.  SKIN: No rash or  itching.  CARDIOVASCULAR: per HPI RESPIRATORY: per HPI GASTROINTESTINAL: No anorexia, nausea, vomiting or diarrhea. No abdominal pain or blood.  GENITOURINARY: No burning on urination, no polyuria NEUROLOGICAL: No headache, dizziness, syncope, paralysis, ataxia, numbness or tingling in the extremities. No change in bowel or bladder control.  MUSCULOSKELETAL: No muscle, back pain, joint pain or stiffness.  LYMPHATICS: No enlarged nodes. No history of splenectomy.  PSYCHIATRIC: No history of depression or anxiety.  ENDOCRINOLOGIC: No reports of sweating, cold or heat intolerance. No polyuria or polydipsia.  Marland Kitchen  Physical Examination Vitals:   01/31/16 1100  BP: 117/84  Pulse: 90   Vitals:   01/31/16 1100  Weight: 138 lb (62.6 kg)  Height: 5\' 8"  (1.727 m)    Gen: resting comfortably, no acute distress HEENT: no scleral icterus, pupils equal round and reactive, no palptable cervical adenopathy,  CV: RRR, no m//r,g no jvd Resp: bilateral wheezing GI: abdomen is soft, non-tender, non-distended, normal bowel sounds, no hepatosplenomegaly MSK: extremities are warm, no edema.  Skin: warm, no rash Neuro:  no focal deficits Psych: appropriate affect   Diagnostic Studies 10/2014 echo Study Conclusions  - Left ventricle: The cavity size was mildly reduced. Wall thickness was increased in a pattern of moderate LVH. Systolic function was vigorous. The estimated ejection fraction was in the range of 65% to 70%. There is mitral chordal SAM. Resting LVOT gradient 27 mmHg with increase to 77 mmHg with Valsalva consistent with hypertrophic obstructive cardiomyopathy. Wall motion was normal; there were no regional wall motion abnormalities. Doppler parameters are consistent with abnormal left ventricular relaxation (grade 1 diastolic dysfunction). - Aortic valve: Mildly calcified annulus. Trileaflet; mildly calcified leaflets. Peak gradient (S): 27 mm Hg at  rest reflecting LVOT flow. - Aortic root: The aortic root was mildly ectatic. - Mitral valve: Calcified annulus. There was trivial regurgitation. - Left atrium: The atrium was at the upper limits of normal in size. - Right atrium: Central venous pressure (est): 3 mm Hg. - Atrial septum: No defect or patent foramen ovale was identified. - Tricuspid valve: There was trivial regurgitation. - Pulmonary arteries: Systolic pressure could not be accurately estimated. - Pericardium, extracardiac: There was no pericardial effusion.     Assessment and Plan  1. Hx of CVA - loop recorder in place, continue to follow for possible significant cardiac arrhthmias that may be associated with his multiple CVAs. No recent arrhythmias found by device check.   2. Left ventricular hypertrophy - dynamic gradient noted on echo due to symmetric LVH and hyperdynamic LV - off beta blocker due to recent pauses noted on loop recorder and presyncope - continue to monitor.   3. OSA - followed by BVA  4. Presyncope/Bradycardia - episode corresponded with sinus pause on loop recorder, plan discussed with EP Dr Rayann Heman and is to follow symptoms and loop reports while off propanolol. In early discussions about a pacemaker patient is fairly against it at this time.  - no recurrent bradycardia noted on loop recorder checks, no recurrent symptoms -continue to monitor - EKG in clinic today shows normal sinus rhythm  5. SOB - suspected COPD given his active wheezing. We will order PFTs. Restart his albuterol and spiritiva he has been off several years.     F/u 6 months   Arnoldo Lenis, M.D

## 2016-02-01 DIAGNOSIS — Z79891 Long term (current) use of opiate analgesic: Secondary | ICD-10-CM | POA: Diagnosis not present

## 2016-02-01 DIAGNOSIS — R2681 Unsteadiness on feet: Secondary | ICD-10-CM | POA: Diagnosis not present

## 2016-02-01 DIAGNOSIS — R4182 Altered mental status, unspecified: Secondary | ICD-10-CM | POA: Diagnosis not present

## 2016-02-01 DIAGNOSIS — J449 Chronic obstructive pulmonary disease, unspecified: Secondary | ICD-10-CM | POA: Diagnosis not present

## 2016-02-01 DIAGNOSIS — Z9181 History of falling: Secondary | ICD-10-CM | POA: Diagnosis not present

## 2016-02-01 DIAGNOSIS — R4781 Slurred speech: Secondary | ICD-10-CM | POA: Diagnosis not present

## 2016-02-01 DIAGNOSIS — Z7902 Long term (current) use of antithrombotics/antiplatelets: Secondary | ICD-10-CM | POA: Diagnosis not present

## 2016-02-01 DIAGNOSIS — Z8673 Personal history of transient ischemic attack (TIA), and cerebral infarction without residual deficits: Secondary | ICD-10-CM | POA: Diagnosis not present

## 2016-02-01 DIAGNOSIS — R5383 Other fatigue: Secondary | ICD-10-CM | POA: Diagnosis not present

## 2016-02-01 DIAGNOSIS — G40919 Epilepsy, unspecified, intractable, without status epilepticus: Secondary | ICD-10-CM | POA: Diagnosis not present

## 2016-02-05 DIAGNOSIS — Z9181 History of falling: Secondary | ICD-10-CM | POA: Diagnosis not present

## 2016-02-05 DIAGNOSIS — Z79891 Long term (current) use of opiate analgesic: Secondary | ICD-10-CM | POA: Diagnosis not present

## 2016-02-05 DIAGNOSIS — R2681 Unsteadiness on feet: Secondary | ICD-10-CM | POA: Diagnosis not present

## 2016-02-05 DIAGNOSIS — R4781 Slurred speech: Secondary | ICD-10-CM | POA: Diagnosis not present

## 2016-02-05 DIAGNOSIS — Z7902 Long term (current) use of antithrombotics/antiplatelets: Secondary | ICD-10-CM | POA: Diagnosis not present

## 2016-02-05 DIAGNOSIS — Z8673 Personal history of transient ischemic attack (TIA), and cerebral infarction without residual deficits: Secondary | ICD-10-CM | POA: Diagnosis not present

## 2016-02-05 DIAGNOSIS — R5383 Other fatigue: Secondary | ICD-10-CM | POA: Diagnosis not present

## 2016-02-05 DIAGNOSIS — R4182 Altered mental status, unspecified: Secondary | ICD-10-CM | POA: Diagnosis not present

## 2016-02-05 DIAGNOSIS — J449 Chronic obstructive pulmonary disease, unspecified: Secondary | ICD-10-CM | POA: Diagnosis not present

## 2016-02-05 DIAGNOSIS — G40919 Epilepsy, unspecified, intractable, without status epilepticus: Secondary | ICD-10-CM | POA: Diagnosis not present

## 2016-02-06 DIAGNOSIS — R4182 Altered mental status, unspecified: Secondary | ICD-10-CM | POA: Diagnosis not present

## 2016-02-06 DIAGNOSIS — Z79891 Long term (current) use of opiate analgesic: Secondary | ICD-10-CM | POA: Diagnosis not present

## 2016-02-06 DIAGNOSIS — Z8673 Personal history of transient ischemic attack (TIA), and cerebral infarction without residual deficits: Secondary | ICD-10-CM | POA: Diagnosis not present

## 2016-02-06 DIAGNOSIS — R5383 Other fatigue: Secondary | ICD-10-CM | POA: Diagnosis not present

## 2016-02-06 DIAGNOSIS — R4781 Slurred speech: Secondary | ICD-10-CM | POA: Diagnosis not present

## 2016-02-06 DIAGNOSIS — G40919 Epilepsy, unspecified, intractable, without status epilepticus: Secondary | ICD-10-CM | POA: Diagnosis not present

## 2016-02-06 DIAGNOSIS — J449 Chronic obstructive pulmonary disease, unspecified: Secondary | ICD-10-CM | POA: Diagnosis not present

## 2016-02-06 DIAGNOSIS — R2681 Unsteadiness on feet: Secondary | ICD-10-CM | POA: Diagnosis not present

## 2016-02-06 DIAGNOSIS — Z9181 History of falling: Secondary | ICD-10-CM | POA: Diagnosis not present

## 2016-02-06 DIAGNOSIS — Z7902 Long term (current) use of antithrombotics/antiplatelets: Secondary | ICD-10-CM | POA: Diagnosis not present

## 2016-02-07 DIAGNOSIS — R4781 Slurred speech: Secondary | ICD-10-CM | POA: Diagnosis not present

## 2016-02-07 DIAGNOSIS — Z79891 Long term (current) use of opiate analgesic: Secondary | ICD-10-CM | POA: Diagnosis not present

## 2016-02-07 DIAGNOSIS — R4182 Altered mental status, unspecified: Secondary | ICD-10-CM | POA: Diagnosis not present

## 2016-02-07 DIAGNOSIS — R2681 Unsteadiness on feet: Secondary | ICD-10-CM | POA: Diagnosis not present

## 2016-02-07 DIAGNOSIS — Z8673 Personal history of transient ischemic attack (TIA), and cerebral infarction without residual deficits: Secondary | ICD-10-CM | POA: Diagnosis not present

## 2016-02-07 DIAGNOSIS — G40919 Epilepsy, unspecified, intractable, without status epilepticus: Secondary | ICD-10-CM | POA: Diagnosis not present

## 2016-02-07 DIAGNOSIS — J449 Chronic obstructive pulmonary disease, unspecified: Secondary | ICD-10-CM | POA: Diagnosis not present

## 2016-02-07 DIAGNOSIS — Z9181 History of falling: Secondary | ICD-10-CM | POA: Diagnosis not present

## 2016-02-07 DIAGNOSIS — R5383 Other fatigue: Secondary | ICD-10-CM | POA: Diagnosis not present

## 2016-02-07 DIAGNOSIS — Z7902 Long term (current) use of antithrombotics/antiplatelets: Secondary | ICD-10-CM | POA: Diagnosis not present

## 2016-02-08 ENCOUNTER — Ambulatory Visit (HOSPITAL_COMMUNITY)
Admission: RE | Admit: 2016-02-08 | Discharge: 2016-02-08 | Disposition: A | Discharge status: Home / Self Care | Payer: Medicare Other | Source: Ambulatory Visit | Attending: Cardiology | Admitting: Cardiology

## 2016-02-08 ENCOUNTER — Ambulatory Visit (INDEPENDENT_AMBULATORY_CARE_PROVIDER_SITE_OTHER): Payer: Medicare Other | Admitting: *Deleted

## 2016-02-08 DIAGNOSIS — J988 Other specified respiratory disorders: Secondary | ICD-10-CM | POA: Diagnosis not present

## 2016-02-08 DIAGNOSIS — I6310 Cerebral infarction due to embolism of unspecified precerebral artery: Secondary | ICD-10-CM

## 2016-02-08 DIAGNOSIS — R0602 Shortness of breath: Secondary | ICD-10-CM | POA: Diagnosis not present

## 2016-02-08 LAB — PULMONARY FUNCTION TEST
DL/VA % pred: 101 %
DL/VA: 4.57 ml/min/mmHg/L
DLCO unc % pred: 78 %
DLCO unc: 23.27 ml/min/mmHg
FEF 25-75 Post: 2.41 L/sec
FEF 25-75 Pre: 1.58 L/sec
FEF2575-%Change-Post: 52 %
FEF2575-%Pred-Post: 96 %
FEF2575-%Pred-Pre: 63 %
FEV1-%Change-Post: 14 %
FEV1-%Pred-Post: 79 %
FEV1-%Pred-Pre: 69 %
FEV1-Post: 2.51 L
FEV1-Pre: 2.19 L
FEV1FVC-%Change-Post: 2 %
FEV1FVC-%Pred-Pre: 94 %
FEV6-%Change-Post: 12 %
FEV6-%Pred-Post: 85 %
FEV6-%Pred-Pre: 75 %
FEV6-Post: 3.44 L
FEV6-Pre: 3.05 L
FEV6FVC-%Change-Post: 0 %
FEV6FVC-%Pred-Post: 104 %
FEV6FVC-%Pred-Pre: 104 %
FVC-%Change-Post: 12 %
FVC-%Pred-Post: 81 %
FVC-%Pred-Pre: 72 %
FVC-Post: 3.45 L
FVC-Pre: 3.08 L
Post FEV1/FVC ratio: 73 %
Post FEV6/FVC ratio: 100 %
Pre FEV1/FVC ratio: 71 %
Pre FEV6/FVC Ratio: 99 %
RV % pred: 164 %
RV: 3.71 L
TLC % pred: 101 %
TLC: 6.73 L

## 2016-02-08 MED ORDER — ALBUTEROL SULFATE (2.5 MG/3ML) 0.083% IN NEBU
2.5000 mg | INHALATION_SOLUTION | Freq: Once | RESPIRATORY_TRACT | Status: AC
  Administered 2016-02-08: 2.5 mg via RESPIRATORY_TRACT

## 2016-02-11 NOTE — Progress Notes (Signed)
Carelink Summary Report / Loop Recorder 

## 2016-02-12 ENCOUNTER — Telehealth: Payer: Self-pay | Admitting: *Deleted

## 2016-02-12 DIAGNOSIS — J449 Chronic obstructive pulmonary disease, unspecified: Secondary | ICD-10-CM | POA: Diagnosis not present

## 2016-02-12 DIAGNOSIS — R4182 Altered mental status, unspecified: Secondary | ICD-10-CM | POA: Diagnosis not present

## 2016-02-12 DIAGNOSIS — R2681 Unsteadiness on feet: Secondary | ICD-10-CM | POA: Diagnosis not present

## 2016-02-12 DIAGNOSIS — Z8673 Personal history of transient ischemic attack (TIA), and cerebral infarction without residual deficits: Secondary | ICD-10-CM | POA: Diagnosis not present

## 2016-02-12 DIAGNOSIS — R4781 Slurred speech: Secondary | ICD-10-CM | POA: Diagnosis not present

## 2016-02-12 DIAGNOSIS — Z9181 History of falling: Secondary | ICD-10-CM | POA: Diagnosis not present

## 2016-02-12 DIAGNOSIS — R5383 Other fatigue: Secondary | ICD-10-CM | POA: Diagnosis not present

## 2016-02-12 DIAGNOSIS — Z7902 Long term (current) use of antithrombotics/antiplatelets: Secondary | ICD-10-CM | POA: Diagnosis not present

## 2016-02-12 DIAGNOSIS — Z79891 Long term (current) use of opiate analgesic: Secondary | ICD-10-CM | POA: Diagnosis not present

## 2016-02-12 DIAGNOSIS — G40919 Epilepsy, unspecified, intractable, without status epilepticus: Secondary | ICD-10-CM | POA: Diagnosis not present

## 2016-02-12 NOTE — Telephone Encounter (Signed)
-----   Message from Arnoldo Lenis, MD sent at 02/11/2016 10:09 AM EDT ----- Breathing tests do confirm mild to moderate COPD. Has he restarted his home inhalers since our last visit?   [name] MD

## 2016-02-12 NOTE — Telephone Encounter (Signed)
Pt aware, hasn't started inhalers yet - death in the family - says wife will pick up tomorrow. Routed to pcp

## 2016-02-14 DIAGNOSIS — R4182 Altered mental status, unspecified: Secondary | ICD-10-CM | POA: Diagnosis not present

## 2016-02-14 DIAGNOSIS — Z9181 History of falling: Secondary | ICD-10-CM | POA: Diagnosis not present

## 2016-02-14 DIAGNOSIS — G40919 Epilepsy, unspecified, intractable, without status epilepticus: Secondary | ICD-10-CM | POA: Diagnosis not present

## 2016-02-14 DIAGNOSIS — Z79891 Long term (current) use of opiate analgesic: Secondary | ICD-10-CM | POA: Diagnosis not present

## 2016-02-14 DIAGNOSIS — R4781 Slurred speech: Secondary | ICD-10-CM | POA: Diagnosis not present

## 2016-02-14 DIAGNOSIS — Z7902 Long term (current) use of antithrombotics/antiplatelets: Secondary | ICD-10-CM | POA: Diagnosis not present

## 2016-02-14 DIAGNOSIS — R2681 Unsteadiness on feet: Secondary | ICD-10-CM | POA: Diagnosis not present

## 2016-02-14 DIAGNOSIS — Z8673 Personal history of transient ischemic attack (TIA), and cerebral infarction without residual deficits: Secondary | ICD-10-CM | POA: Diagnosis not present

## 2016-02-14 DIAGNOSIS — R5383 Other fatigue: Secondary | ICD-10-CM | POA: Diagnosis not present

## 2016-02-14 DIAGNOSIS — J449 Chronic obstructive pulmonary disease, unspecified: Secondary | ICD-10-CM | POA: Diagnosis not present

## 2016-02-18 DIAGNOSIS — F329 Major depressive disorder, single episode, unspecified: Secondary | ICD-10-CM | POA: Diagnosis not present

## 2016-02-19 ENCOUNTER — Other Ambulatory Visit: Payer: Self-pay

## 2016-02-19 ENCOUNTER — Ambulatory Visit (INDEPENDENT_AMBULATORY_CARE_PROVIDER_SITE_OTHER): Payer: Medicare Other | Admitting: Gastroenterology

## 2016-02-19 ENCOUNTER — Encounter: Payer: Self-pay | Admitting: Gastroenterology

## 2016-02-19 VITALS — BP 111/74 | HR 86 | Temp 98.1°F | Ht 68.0 in | Wt 133.4 lb

## 2016-02-19 DIAGNOSIS — K921 Melena: Secondary | ICD-10-CM | POA: Diagnosis not present

## 2016-02-19 DIAGNOSIS — R109 Unspecified abdominal pain: Secondary | ICD-10-CM | POA: Insufficient documentation

## 2016-02-19 DIAGNOSIS — K7460 Unspecified cirrhosis of liver: Secondary | ICD-10-CM | POA: Diagnosis not present

## 2016-02-19 DIAGNOSIS — K5900 Constipation, unspecified: Secondary | ICD-10-CM | POA: Insufficient documentation

## 2016-02-19 DIAGNOSIS — R634 Abnormal weight loss: Secondary | ICD-10-CM | POA: Diagnosis not present

## 2016-02-19 DIAGNOSIS — K5909 Other constipation: Secondary | ICD-10-CM | POA: Diagnosis not present

## 2016-02-19 DIAGNOSIS — R1084 Generalized abdominal pain: Secondary | ICD-10-CM

## 2016-02-19 HISTORY — DX: Unspecified cirrhosis of liver: K74.60

## 2016-02-19 LAB — CBC WITH DIFFERENTIAL/PLATELET
Basophils Absolute: 65 cells/uL (ref 0–200)
Basophils Relative: 1 %
Eosinophils Absolute: 65 cells/uL (ref 15–500)
Eosinophils Relative: 1 %
HCT: 41.4 % (ref 38.5–50.0)
Hemoglobin: 14.4 g/dL (ref 13.2–17.1)
Lymphocytes Relative: 47 %
Lymphs Abs: 3055 cells/uL (ref 850–3900)
MCH: 30.7 pg (ref 27.0–33.0)
MCHC: 34.8 g/dL (ref 32.0–36.0)
MCV: 88.3 fL (ref 80.0–100.0)
MPV: 8.8 fL (ref 7.5–12.5)
Monocytes Absolute: 910 cells/uL (ref 200–950)
Monocytes Relative: 14 %
Neutro Abs: 2405 cells/uL (ref 1500–7800)
Neutrophils Relative %: 37 %
Platelets: 216 10*3/uL (ref 140–400)
RBC: 4.69 MIL/uL (ref 4.20–5.80)
RDW: 13.7 % (ref 11.0–15.0)
WBC: 6.5 10*3/uL (ref 3.8–10.8)

## 2016-02-19 LAB — COMPREHENSIVE METABOLIC PANEL
ALT: 6 U/L — ABNORMAL LOW (ref 9–46)
AST: 10 U/L (ref 10–35)
Albumin: 3.9 g/dL (ref 3.6–5.1)
Alkaline Phosphatase: 39 U/L — ABNORMAL LOW (ref 40–115)
BUN: 13 mg/dL (ref 7–25)
CO2: 26 mmol/L (ref 20–31)
Calcium: 8.6 mg/dL (ref 8.6–10.3)
Chloride: 103 mmol/L (ref 98–110)
Creat: 0.88 mg/dL (ref 0.70–1.25)
Glucose, Bld: 58 mg/dL — ABNORMAL LOW (ref 65–99)
Potassium: 4 mmol/L (ref 3.5–5.3)
Sodium: 139 mmol/L (ref 135–146)
Total Bilirubin: 0.5 mg/dL (ref 0.2–1.2)
Total Protein: 6.4 g/dL (ref 6.1–8.1)

## 2016-02-19 MED ORDER — LUBIPROSTONE 24 MCG PO CAPS: 24.0000 ug | ORAL_CAPSULE | Freq: Two times a day (BID) | ORAL | 11 refills | Status: DC

## 2016-02-19 MED ORDER — RIFAXIMIN 550 MG PO TABS: 550.0000 mg | ORAL_TABLET | Freq: Two times a day (BID) | ORAL | 11 refills | Status: DC

## 2016-02-19 NOTE — Progress Notes (Signed)
Primary Care Physician:  Jeri Modena  Primary Gastroenterologist:  Garfield Cornea, MD   Chief Complaint  Patient presents with  . Weight Loss    HPI:  Eric Tran is a 65 y.o. male here At the request of Eric Tran PCP for further evaluation of chronic hepatitis C. Patient's wife states that he is not here for chronic hep C but for weight loss. Most of history provided by the wife, patient answers a few questions but prefers her to give history. She is a Marine scientist.  Patient has a history of chronic hepatitis see but has since completed Harvoni through the [facility]. Completed therapy last year. He has been told he has cirrhosis in 2012. Was seen by Dr. Donnal Moat at the [facility] in [place], wife state he did not thing, no labs or imaging just return in 6 months.   Recently he has increased fatigue, confusion. Found walking around the middle the night in the backyard, patient has no recollection. Confusion last for over a week. Noted to have an abnormally elevated ammonia level, 110 one month ago. Placed on lactulose but he has trouble keeping it down. Usually vomiting within 45 minutes. He has had some issues with constipation even before he was started on pain medication but has worsened recently. Currently on Colace. Concerned about having black stools. No bright red blood per rectum. Reported 17 pound weight loss since June. Patient's wife states that he's been very depressed, stays in bed, decreased appetite. They had to stop Lyrica last week which was started in April to see if that was the cause. He had a stroke in 2012 affecting the left side of Eric Tran body. Predominantly Eric Tran left arm is involved. He does have short-term memory issues from this.He has fleeting abdominal pain which lasts for minutes at a time.  Patient reports having an EGD at the [place] in 2015 [place]. He had an attempted colonoscopy as well but the prep was bad. Reports liver biopsy at that time. We have requested records for  review.   Current Outpatient Prescriptions  Medication Sig Dispense Refill  . albuterol (PROVENTIL HFA;VENTOLIN HFA) 108 (90 Base) MCG/ACT inhaler Inhale 2 puffs into the lungs every 6 (six) hours as needed for wheezing or shortness of breath. 1 Inhaler 2  . ALPRAZolam (XANAX) 0.25 MG tablet Take 0.25 mg by mouth 3 (three) times daily as needed for anxiety.    . baclofen (LIORESAL) 10 MG tablet Take 1 tablet by mouth 3 (three) times daily.    . carbidopa-levodopa (SINEMET IR) 25-100 MG tablet Take 1 tablet by mouth 3 (three) times daily.    . clopidogrel (PLAVIX) 75 MG tablet Take 75 mg by mouth daily. Reported on 10/03/2015    . divalproex (DEPAKOTE) 500 MG DR tablet Take 500 mg by mouth 2 (two) times daily. Pt taking 500 mg daily    . docusate sodium (COLACE) 100 MG capsule Take 100 mg by mouth daily.    . ferrous gluconate (FERGON) 324 MG tablet Take 324 mg by mouth daily.    . hydrALAZINE (APRESOLINE) 25 MG tablet Take 25 mg by mouth 2 (two) times daily.    Marland Kitchen lisinopril (PRINIVIL,ZESTRIL) 40 MG tablet Take 40 mg by mouth daily.    . Oxcarbazepine (TRILEPTAL) 300 MG tablet Take 300 mg by mouth 2 (two) times daily.    . Oxycodone HCl 10 MG TABS Take 1 tablet by mouth every 6 (six) hours as needed.    . sildenafil (VIAGRA) 50 MG  tablet Take 25 mg by mouth daily as needed for erectile dysfunction.     . Tamsulosin HCl (FLOMAX) 0.4 MG CAPS Take 0.4 mg by mouth daily.     Marland Kitchen tiotropium (SPIRIVA HANDIHALER) 18 MCG inhalation capsule Place 1 capsule (18 mcg total) into inhaler and inhale daily. 30 capsule 6  . varenicline (CHANTIX) 1 MG tablet Take 1 mg by mouth 2 (two) times daily.     No current facility-administered medications for this visit.     Allergies as of 02/19/2016 - Review Complete 02/19/2016  Allergen Reaction Noted  . Keppra [levetiracetam] Other (See Comments) 10/03/2014  . Adhesive [tape]  11/15/2014  . Morphine and related Nausea And Vomiting 05/01/2011    Past Medical  History:  Diagnosis Date  . Abdominal aortic aneurysm ([place])   . Abnormality of gait 10/03/2014  . Anxiety   . Arthritis   . BPH (benign prostatic hyperplasia)   . Carpal tunnel syndrome   . Cerebrovascular disease 10/03/2014  . Chronic back pain    chronic narcotic use  . Chronic hepatitis C ([place]) 1980   Harvoni 2016, cured. HCV RNA negative 01/2016  . Chronic pain   . Degenerative disc disease   . Depression   . Head trauma   . Hepatic cirrhosis ([place]) 02/19/2016  . Hypertension   . Hyperthyroidism   . Memory difficulties 10/03/2014  . Partial complex seizure disorder without intractable epilepsy ([place]) 10/03/2014  . Seizures ([place])    Epilepsy  . Stroke [facility]) 2012   left hemiplegia  . TIA (transient ischemic attack)   . Tremor   . UTI (lower urinary tract infection)     Past Surgical History:  Procedure Laterality Date  . EP IMPLANTABLE DEVICE N/A 11/15/2014   Procedure: Loop Recorder Insertion;  Surgeon: Thompson Grayer, MD;  Location: [place];  Service: Cardiovascular;  Laterality: N/A;  . HAND SURGERY     right  . NECK SURGERY    . right hand reconstruction      Family History  Problem Relation Age of Onset  . Cervical cancer Mother   . Heart failure Father   . Lung cancer Sister   . Breast cancer Sister   . Colon cancer Neg Hx   . Colon polyps Neg Hx     Social History   Social History  . Marital status: Married    Spouse name: N/A  . Number of children: 2  . Years of education: GED   Occupational History  . unemployed Unemployed   Social History Main Topics  . Smoking status: Current Some Day Smoker    Packs/day: 0.10    Years: 45.00    Types: Cigarettes    Start date: 07/18/1962  . Smokeless tobacco: Never Used  . Alcohol use No     Comment: occasionally  . Drug use: No     Comment: prior cocaine/ marijuana  . Sexual activity: Yes    Partners: Female     Comment: hx multiple sexual partners   Other Topics Concern  . Not on file   Social  History Narrative   1 daughter committed suicide 2008   Patient right handed.   Patient drinks about 6-7 cups of caffeine daily.            ROS:  General: Negative for fever, chills. +anorexia, weight loss, fatigue, weakness. Eyes: Negative for vision changes.  ENT: Negative for hoarseness, difficulty swallowing , nasal congestion. CV: Negative for chest pain, angina,  palpitations, dyspnea on exertion, peripheral edema.  Respiratory: Negative for dyspnea at rest, dyspnea on exertion, cough, sputum, wheezing.  GI: See history of present illness. GU:  Negative for dysuria, hematuria, urinary incontinence, urinary frequency, nocturnal urination.  MS: Negative for joint pain, low back pain. Chronic pain Derm: Negative for rash or itching.  Neuro: Negative for  frequent headaches. See hpi Psych: Negative for anxiety, depression, suicidal ideation, hallucinations.  Endo: see hpi Heme: Negative for bruising or bleeding. Allergy: Negative for rash or hives.    Physical Examination:  BP 111/74   Pulse 86   Temp 98.1 F (36.7 C) (Oral)   Ht 5\' 8"  (1.727 m)   Wt 133 lb 6.4 oz (60.5 kg)   BMI 20.28 kg/m    General: Well-nourished, well-developed in no acute distress. Answers some questions but admits to memory issues and prefers wife to answer. Alert and oriented X 4 Head: Normocephalic, atraumatic.   Eyes: Conjunctiva pink, no icterus. Mouth: Oropharyngeal mucosa moist and pink , no lesions erythema or exudate. Neck: Supple without thyromegaly, masses, or lymphadenopathy.  Lungs: Clear to auscultation bilaterally.  Heart: Regular rate and rhythm, no murmurs rubs or gallops.  Abdomen: Bowel sounds are normal, nontender, nondistended, no hepatosplenomegaly or masses, no abdominal bruits or    hernia , no rebound or guarding.   Rectal: not performed Extremities: No lower extremity edema. No clubbing or deformities.  Neuro: Alert and oriented x 4 , grossly normal neurologically.   Skin: Warm and dry, no rash or jaundice.   Psych: Alert and cooperative, normal mood and affect.  Labs: Lab Results  Component Value Date   CREATININE 0.93 12/27/2015   BUN 9 12/27/2015   NA 143 12/27/2015   K 4.2 12/27/2015   CL 101 12/27/2015   CO2 25 12/27/2015   Lab Results  Component Value Date   ALT 6 12/27/2015   AST 13 12/27/2015   ALKPHOS 56 12/27/2015   BILITOT 0.8 12/27/2015   Lab Results  Component Value Date   WBC 8.1 12/27/2015   HGB 13.8 12/27/2015   HCT 40.5 12/27/2015   MCV 92 12/27/2015   PLT 180 12/27/2015     Imaging Studies: No results found.  Impression/plan: 65 year old gentleman with history of chronic hepatitis C who completed Harvoni last year with successful eradication of the virus. Reportedly with cirrhosis (last imaging locally in 2015, normal liver on CT) and recently developed hepatic encephalopathy. He has significant issues with constipation. Did not tolerate lactulose due to vomiting. No evidence of asterixis on exam today and is alert and oriented. Begin Amitiza for constipation. Start Xifaxan 550 mg twice a day for hepatic encephalopathy prevention. We have requested all of Eric Tran records from the [place] for review. It sounds like he is in need of completed colonoscopy given last one there was inadequate bowel prep. Patient has had significant weight loss and generalized abdominal pain. No recent liver imaging. Need to exclude [place] as well as other etiologies for weight loss a chronic pancreatitis or other malignancies. Further recommendations to follow.

## 2016-02-19 NOTE — Patient Instructions (Signed)
1. Start Amitiza 7mcg twice daily with food for constipation. 2. Start Xifaxan 550mg  twice daily to help keep the ammonia level down. 3. CT scan of abdomen to evaluate liver and weight loss.  4. I will review records from [facility] and make further recommendations for upper endoscopy and colonoscopy.

## 2016-02-20 ENCOUNTER — Telehealth: Payer: Self-pay | Admitting: *Deleted

## 2016-02-20 ENCOUNTER — Telehealth: Payer: Self-pay | Admitting: Gastroenterology

## 2016-02-20 DIAGNOSIS — R4781 Slurred speech: Secondary | ICD-10-CM | POA: Diagnosis not present

## 2016-02-20 DIAGNOSIS — Z9181 History of falling: Secondary | ICD-10-CM | POA: Diagnosis not present

## 2016-02-20 DIAGNOSIS — Z8673 Personal history of transient ischemic attack (TIA), and cerebral infarction without residual deficits: Secondary | ICD-10-CM | POA: Diagnosis not present

## 2016-02-20 DIAGNOSIS — R5383 Other fatigue: Secondary | ICD-10-CM | POA: Diagnosis not present

## 2016-02-20 DIAGNOSIS — J449 Chronic obstructive pulmonary disease, unspecified: Secondary | ICD-10-CM | POA: Diagnosis not present

## 2016-02-20 DIAGNOSIS — R4182 Altered mental status, unspecified: Secondary | ICD-10-CM | POA: Diagnosis not present

## 2016-02-20 DIAGNOSIS — Z7902 Long term (current) use of antithrombotics/antiplatelets: Secondary | ICD-10-CM | POA: Diagnosis not present

## 2016-02-20 DIAGNOSIS — G40919 Epilepsy, unspecified, intractable, without status epilepticus: Secondary | ICD-10-CM | POA: Diagnosis not present

## 2016-02-20 DIAGNOSIS — R2681 Unsteadiness on feet: Secondary | ICD-10-CM | POA: Diagnosis not present

## 2016-02-20 DIAGNOSIS — Z79891 Long term (current) use of opiate analgesic: Secondary | ICD-10-CM | POA: Diagnosis not present

## 2016-02-20 LAB — PROTIME-INR
INR: 1.1
Prothrombin Time: 11.2 s (ref 9.0–11.5)

## 2016-02-20 NOTE — Telephone Encounter (Signed)
Pt [employer] form was faxed to [phone] on 02/19/2016.

## 2016-02-20 NOTE — Telephone Encounter (Signed)
Anderson Malta the [facility] nurse was calling to speak with LSL about patient. Pt was seen yesterday and nurse wanted to touch base with LSL about a few things regarding Mr Pentz. Jennifer's number is [phone]

## 2016-02-20 NOTE — Telephone Encounter (Signed)
Tried to call [place], [place]

## 2016-02-21 LAB — HCV RNA QUANT RFLX ULTRA OR GENOTYP: HCV Quantitative: NOT DETECTED IU/mL (ref <15)

## 2016-02-22 ENCOUNTER — Encounter: Payer: Self-pay | Admitting: Gastroenterology

## 2016-02-22 NOTE — Progress Notes (Addendum)
Records from [place] reviewed  Ultrasound 10/26/2015: Mild hepatomegaly, increased echogenicity throughout liver, no focal liver lesions.  July 2017, white blood cell count 4500, hemoglobin 14.3, platelets 247,000, ferritin 28.6, iron 149, TIBC 358, iron saturations 41.6%, albumin 3.6, total bilirubin 0.7, alkaline phosphatase 57, AST 12, ALT 15, AFP 2.03 April 2015, HCV RNA not detected. July 2016 HCV RNA  September 2015 HIV antibody negative  EGD May 2015: H. pylori gastritis. No mention of varices.  Incomplete colonoscopy May 2015: Versed 6 mg, fentanyl 100 g, Benadryl 50 mg Poor prep, visualized colon to the terminal ileum to rule out masses but unable to do adequate screening exam. Repeat colonoscopy in 12 months.  I'll wait for labs and CT scan and then we will proceed with possible EGD and colonoscopy.

## 2016-02-25 NOTE — Progress Notes (Signed)
CC'ED TO PCP 

## 2016-02-25 NOTE — Telephone Encounter (Signed)
Tried to call- NA-LMOM 

## 2016-02-26 ENCOUNTER — Ambulatory Visit (HOSPITAL_COMMUNITY)
Admission: RE | Admit: 2016-02-26 | Discharge: 2016-02-26 | Disposition: A | Discharge status: Home / Self Care | Payer: Medicare Other | Source: Ambulatory Visit | Attending: Gastroenterology | Admitting: Gastroenterology

## 2016-02-26 DIAGNOSIS — I712 Thoracic aortic aneurysm, without rupture: Secondary | ICD-10-CM | POA: Insufficient documentation

## 2016-02-26 DIAGNOSIS — K5909 Other constipation: Secondary | ICD-10-CM | POA: Diagnosis not present

## 2016-02-26 DIAGNOSIS — J449 Chronic obstructive pulmonary disease, unspecified: Secondary | ICD-10-CM | POA: Diagnosis not present

## 2016-02-26 DIAGNOSIS — N200 Calculus of kidney: Secondary | ICD-10-CM | POA: Diagnosis not present

## 2016-02-26 DIAGNOSIS — R634 Abnormal weight loss: Secondary | ICD-10-CM | POA: Insufficient documentation

## 2016-02-26 DIAGNOSIS — K7460 Unspecified cirrhosis of liver: Secondary | ICD-10-CM

## 2016-02-26 DIAGNOSIS — R4781 Slurred speech: Secondary | ICD-10-CM | POA: Diagnosis not present

## 2016-02-26 DIAGNOSIS — G40919 Epilepsy, unspecified, intractable, without status epilepticus: Secondary | ICD-10-CM | POA: Diagnosis not present

## 2016-02-26 DIAGNOSIS — Z8673 Personal history of transient ischemic attack (TIA), and cerebral infarction without residual deficits: Secondary | ICD-10-CM | POA: Diagnosis not present

## 2016-02-26 DIAGNOSIS — R5383 Other fatigue: Secondary | ICD-10-CM | POA: Diagnosis not present

## 2016-02-26 DIAGNOSIS — K862 Cyst of pancreas: Secondary | ICD-10-CM | POA: Diagnosis not present

## 2016-02-26 DIAGNOSIS — K921 Melena: Secondary | ICD-10-CM | POA: Diagnosis present

## 2016-02-26 DIAGNOSIS — Z9181 History of falling: Secondary | ICD-10-CM | POA: Diagnosis not present

## 2016-02-26 DIAGNOSIS — R1084 Generalized abdominal pain: Secondary | ICD-10-CM | POA: Diagnosis not present

## 2016-02-26 DIAGNOSIS — R2681 Unsteadiness on feet: Secondary | ICD-10-CM | POA: Diagnosis not present

## 2016-02-26 DIAGNOSIS — Z7902 Long term (current) use of antithrombotics/antiplatelets: Secondary | ICD-10-CM | POA: Diagnosis not present

## 2016-02-26 DIAGNOSIS — R4182 Altered mental status, unspecified: Secondary | ICD-10-CM | POA: Diagnosis not present

## 2016-02-26 DIAGNOSIS — Z79891 Long term (current) use of opiate analgesic: Secondary | ICD-10-CM | POA: Diagnosis not present

## 2016-02-26 MED ORDER — IOPAMIDOL (ISOVUE-300) INJECTION 61%
100.0000 mL | Freq: Once | INTRAVENOUS | Status: AC | PRN
  Administered 2016-02-26: 100 mL via INTRAVENOUS

## 2016-02-29 ENCOUNTER — Encounter: Payer: Self-pay | Admitting: Gastroenterology

## 2016-02-29 NOTE — Progress Notes (Signed)
Labs show HCV negative. Liver numbers unremarkable with good albumin level and good PT/INR. Good hepatic function. CBC normal with no thrombocytopenia. CT with no evidence of cirrhosis or cause for weight loss.  EGD May 2015: H. pylori gastritis. No mention of varices. Incomplete colonoscopy May 2015: Versed 6 mg, fentanyl 100 g, Benadryl 50 mg Poor prep, visualized colon to the terminal ileum to rule out masses but unable to do adequate screening exam. Repeat colonoscopy in 12 months.  -->Needs EGD/TCS with RMR in OR (polypharmacy) for abnormal weight loss, abdominal pain, incomplete colonoscopy in 2015, constipation.  -->let endo know he has implantable loop recorder, just FYI. -->hold iron seven days before procedure.  -->two full days of clear liquids. Take extra dulcolax, give 10mg  orally daily starting three days before the bowel prep starts. Continue the amitiza bid as before.

## 2016-02-29 NOTE — Progress Notes (Signed)
See lab result note. Also he has stable 3.6 cm distal thoracic aortic aneurysm. Needs to follow with PCP for this. Should be having periodic reevaluation to look for increase in size.  He has 2-3 mm pancreatic body cysts and recommended reimaging in 2 years for stability, patient likely cannot have MRI given implantable loop recorder. If not, we will to CT Abd pancreatic protocol. I will let you know.

## 2016-03-04 NOTE — Telephone Encounter (Signed)
Tried to call- NA. I have been unable to reach Home health nurse.

## 2016-03-05 ENCOUNTER — Other Ambulatory Visit: Payer: Self-pay

## 2016-03-05 DIAGNOSIS — R634 Abnormal weight loss: Secondary | ICD-10-CM

## 2016-03-05 DIAGNOSIS — R109 Unspecified abdominal pain: Secondary | ICD-10-CM

## 2016-03-05 MED ORDER — NA SULFATE-K SULFATE-MG SULF 17.5-3.13-1.6 GM/177ML PO SOLN: 1.0000 | ORAL | 0 refills | Status: DC

## 2016-03-05 NOTE — Progress Notes (Signed)
Scheduled TCS/EGD in OR with RMR 03/20/16 AT 7:30am. Wife aware and also aware of special instructions. Letter mailed.

## 2016-03-08 LAB — CUP PACEART REMOTE DEVICE CHECK: Date Time Interrogation Session: 20170811234027

## 2016-03-10 ENCOUNTER — Ambulatory Visit (INDEPENDENT_AMBULATORY_CARE_PROVIDER_SITE_OTHER): Payer: Medicare Other | Admitting: *Deleted

## 2016-03-10 DIAGNOSIS — I6310 Cerebral infarction due to embolism of unspecified precerebral artery: Secondary | ICD-10-CM

## 2016-03-11 NOTE — Progress Notes (Signed)
Carelink Summary Report / Loop Recorder 

## 2016-03-14 NOTE — Patient Instructions (Signed)
Eric Tran  03/14/2016     @PREFPERIOPPHARMACY @   Your procedure is scheduled on 03/20/2016.  Report to [facility] at 6:15 A.M.  Call this number if you have problems the morning of surgery:  [phone]   Remember:  Do not eat food or drink liquids after midnight.  Take these medicines the morning of surgery with A SIP OF WATER : Xanax, Lioresal, Sinemet, Depakote, Colace, Apresoline, Lisinopril and Trileptal.  Please use your Spiriva and Albuterol Inhalers before leaving home and bring them with you to the hospital.   Do not wear jewelry, make-up or nail polish.  Do not wear lotions, powders, or perfumes, or deoderant.  Do not shave 48 hours prior to surgery.  Men may shave face and neck.  Do not bring valuables to the hospital.  [facility] is not responsible for any belongings or valuables.  Contacts, dentures or bridgework may not be worn into surgery.  Leave your suitcase in the car.  After surgery it may be brought to your room.  For patients admitted to the hospital, discharge time will be determined by your treatment team.  Patients discharged the day of surgery will not be allowed to drive home.   Name and phone number of your driver:   family Special instructions:  N/A  Please read over the following fact sheets that you were given. Care and Recovery After Surgery   Esophagogastroduodenoscopy Esophagogastroduodenoscopy (EGD) is a procedure that is used to examine the lining of the esophagus, stomach, and first part of the small intestine (duodenum). A long, flexible, lighted tube with a camera attached (endoscope) is inserted down the throat to view these organs. This procedure is done to detect problems or abnormalities, such as inflammation, bleeding, ulcers, or growths, in order to treat them. The procedure lasts 5-20 minutes. It is usually an outpatient procedure, but it may need to be performed in a hospital in emergency cases. LET [facility] CARE PROVIDER  KNOW ABOUT:  Any allergies you have.  All medicines you are taking, including vitamins, herbs, eye drops, creams, and over-the-counter medicines.  Previous problems you or members of your family have had with the use of anesthetics.  Any blood disorders you have.  Previous surgeries you have had.  Medical conditions you have. RISKS AND COMPLICATIONS Generally, this is a safe procedure. However, problems can occur and include:  Infection.  Bleeding.  Tearing (perforation) of the esophagus, stomach, or duodenum.  Difficulty breathing or not being able to breathe.  Excessive sweating.  Spasms of the larynx.  Slowed heartbeat.  Low blood pressure. BEFORE THE PROCEDURE  Do not eat or drink anything after midnight on the night before the procedure or as directed by your health care provider.  Do not take your regular medicines before the procedure if your health care provider asks you not to. Ask your health care provider about changing or stopping those medicines.  If you wear dentures, be prepared to remove them before the procedure.  Arrange for someone to drive you home after the procedure. PROCEDURE  A numbing medicine (local anesthetic) may be sprayed in your throat for comfort and to stop you from gagging or coughing.  You will have an IV tube inserted in a vein in your hand or arm. You will receive medicines and fluids through this tube.  You will be given a medicine to relax you (sedative).  A pain reliever will be given through the IV tube.  A mouth  guard may be placed in your mouth to protect your teeth and to keep you from biting on the endoscope.  You will be asked to lie on your left side.  The endoscope will be inserted down your throat and into your esophagus, stomach, and duodenum.  Air will be put through the endoscope to allow your health care provider to clearly view the lining of your esophagus.  The lining of your esophagus, stomach, and  duodenum will be examined. During the exam, your health care provider may:  Remove tissue to be examined under a microscope (biopsy) for inflammation, infection, or other medical problems.  Remove growths.  Remove objects (foreign bodies) that are stuck.  Treat any bleeding with medicines or other devices that stop tissues from bleeding (hot cautery, clipping devices).  Widen (dilate) or stretch narrowed areas of your esophagus and stomach.  The endoscope will be withdrawn. AFTER THE PROCEDURE  You will be taken to a recovery area for observation. Your blood pressure, heart rate, breathing rate, and blood oxygen level will be monitored often until the medicines you were given have worn off.  Do not eat or drink anything until the numbing medicine has worn off and your gag reflex has returned. You may choke.  Your health care provider should be able to discuss his or her findings with you. It will take longer to discuss the test results if any biopsies were taken.   This information is not intended to replace advice given to you by your health care provider. Make sure you discuss any questions you have with your health care provider.   Document Released: 10/17/2004 Document Revised: 07/07/2014 Document Reviewed: 05/19/2012 Elsevier Interactive Patient Education 2016 [employer].  Colonoscopy A colonoscopy is an exam to look at the entire large intestine (colon). This exam can help find problems such as tumors, polyps, inflammation, and areas of bleeding. The exam takes about 1 hour.  LET [facility] CARE PROVIDER KNOW ABOUT:   Any allergies you have.  All medicines you are taking, including vitamins, herbs, eye drops, creams, and over-the-counter medicines.  Previous problems you or members of your family have had with the use of anesthetics.  Any blood disorders you have.  Previous surgeries you have had.  Medical conditions you have. RISKS AND COMPLICATIONS  Generally,  this is a safe procedure. However, as with any procedure, complications can occur. Possible complications include:  Bleeding.  Tearing or rupture of the colon wall.  Reaction to medicines given during the exam.  Infection (rare). BEFORE THE PROCEDURE   Ask your health care provider about changing or stopping your regular medicines.  You may be prescribed an oral bowel prep. This involves drinking a large amount of medicated liquid, starting the day before your procedure. The liquid will cause you to have multiple loose stools until your stool is almost clear or light green. This cleans out your colon in preparation for the procedure.  Do not eat or drink anything else once you have started the bowel prep, unless your health care provider tells you it is safe to do so.  Arrange for someone to drive you home after the procedure. PROCEDURE   You will be given medicine to help you relax (sedative).  You will lie on your side with your knees bent.  A long, flexible tube with a light and camera on the end (colonoscope) will be inserted through the rectum and into the colon. The camera sends video back to a computer  screen as it moves through the colon. The colonoscope also releases carbon dioxide gas to inflate the colon. This helps your health care provider see the area better.  During the exam, your health care provider may take a small tissue sample (biopsy) to be examined under a microscope if any abnormalities are found.  The exam is finished when the entire colon has been viewed. AFTER THE PROCEDURE   Do not drive for 24 hours after the exam.  You may have a small amount of blood in your stool.  You may pass moderate amounts of gas and have mild abdominal cramping or bloating. This is caused by the gas used to inflate your colon during the exam.  Ask when your test results will be ready and how you will get your results. Make sure you get your test results.   This information  is not intended to replace advice given to you by your health care provider. Make sure you discuss any questions you have with your health care provider.   Document Released: 06/13/2000 Document Revised: 04/06/2013 Document Reviewed: 02/21/2013 Elsevier Interactive Patient Education [employer].

## 2016-03-18 ENCOUNTER — Encounter (HOSPITAL_COMMUNITY): Payer: Self-pay

## 2016-03-18 ENCOUNTER — Encounter (HOSPITAL_COMMUNITY)
Admission: RE | Admit: 2016-03-18 | Discharge: 2016-03-18 | Disposition: A | Discharge status: Home / Self Care | Payer: Medicare Other | Source: Ambulatory Visit | Attending: Internal Medicine | Admitting: Internal Medicine

## 2016-03-18 DIAGNOSIS — R109 Unspecified abdominal pain: Secondary | ICD-10-CM | POA: Diagnosis present

## 2016-03-18 DIAGNOSIS — G890 Central pain syndrome: Secondary | ICD-10-CM | POA: Diagnosis not present

## 2016-03-18 DIAGNOSIS — K766 Portal hypertension: Secondary | ICD-10-CM | POA: Diagnosis not present

## 2016-03-18 DIAGNOSIS — M5412 Radiculopathy, cervical region: Secondary | ICD-10-CM | POA: Diagnosis not present

## 2016-03-18 DIAGNOSIS — G8929 Other chronic pain: Secondary | ICD-10-CM | POA: Diagnosis not present

## 2016-03-18 DIAGNOSIS — I071 Rheumatic tricuspid insufficiency: Secondary | ICD-10-CM | POA: Diagnosis not present

## 2016-03-18 DIAGNOSIS — F17200 Nicotine dependence, unspecified, uncomplicated: Secondary | ICD-10-CM | POA: Diagnosis not present

## 2016-03-18 DIAGNOSIS — I110 Hypertensive heart disease with heart failure: Secondary | ICD-10-CM | POA: Diagnosis not present

## 2016-03-18 DIAGNOSIS — Z8673 Personal history of transient ischemic attack (TIA), and cerebral infarction without residual deficits: Secondary | ICD-10-CM | POA: Diagnosis not present

## 2016-03-18 DIAGNOSIS — I509 Heart failure, unspecified: Secondary | ICD-10-CM | POA: Diagnosis not present

## 2016-03-18 DIAGNOSIS — R634 Abnormal weight loss: Secondary | ICD-10-CM | POA: Diagnosis not present

## 2016-03-18 DIAGNOSIS — K3189 Other diseases of stomach and duodenum: Secondary | ICD-10-CM | POA: Diagnosis not present

## 2016-03-18 DIAGNOSIS — I739 Peripheral vascular disease, unspecified: Secondary | ICD-10-CM | POA: Diagnosis not present

## 2016-03-18 DIAGNOSIS — Z6821 Body mass index (BMI) 21.0-21.9, adult: Secondary | ICD-10-CM | POA: Diagnosis not present

## 2016-03-18 LAB — BASIC METABOLIC PANEL
Anion gap: 8 (ref 5–15)
BUN: 12 mg/dL (ref 6–20)
CO2: 28 mmol/L (ref 22–32)
Calcium: 8.7 mg/dL — ABNORMAL LOW (ref 8.9–10.3)
Chloride: 101 mmol/L (ref 101–111)
Creatinine, Ser: 0.8 mg/dL (ref 0.61–1.24)
GFR calc Af Amer: >60 mL/min (ref >60)
GFR calc non Af Amer: >60 mL/min (ref >60)
Glucose, Bld: 70 mg/dL (ref 65–99)
Potassium: 4.2 mmol/L (ref 3.5–5.1)
Sodium: 137 mmol/L (ref 135–145)

## 2016-03-18 LAB — CBC
HCT: 38 % — ABNORMAL LOW (ref 39.0–52.0)
Hemoglobin: 13.1 g/dL (ref 13.0–17.0)
MCH: 31.3 pg (ref 26.0–34.0)
MCHC: 34.5 g/dL (ref 30.0–36.0)
MCV: 90.7 fL (ref 78.0–100.0)
Platelets: 181 10*3/uL (ref 150–400)
RBC: 4.19 MIL/uL — ABNORMAL LOW (ref 4.22–5.81)
RDW: 13.1 % (ref 11.5–15.5)
WBC: 6.9 10*3/uL (ref 4.0–10.5)

## 2016-03-19 NOTE — Anesthesia Preprocedure Evaluation (Addendum)
Anesthesia Evaluation  Patient identified by MRN, date of birth, ID band Patient awake    Reviewed: Allergy & Precautions, NPO status , Patient's Chart, lab work & pertinent test results  History of Anesthesia Complications Negative for: history of anesthetic complications  Airway Mallampati: I  TM Distance: >3 FB Neck ROM: Full    Dental  (+) Edentulous Upper, Edentulous Lower, Dental Advisory Given   Pulmonary Current Smoker,    Pulmonary exam normal        Cardiovascular hypertension, + Peripheral Vascular Disease and +CHF   Rhythm:Regular + Systolic murmurs Impressions:  - Moderate LVH with LVEF 65-70%. There is mitral chordal SAM.   Resting LVOT gradient 27 mmHg with increase to 77 mmHg with  Valsalva consistent with hypertrophic obstructive cardiomyopathy.   Grade 1 diastolic dysfunction. Upper normal left atrial size.   Sclerotic aortic valve without stenosis. Mildly ectatic aortic   root. Trivial tricuspid regurgitation, unable to assess PASP.   Neuro/Psych PSYCHIATRIC DISORDERS Anxiety Depression TIACVA    GI/Hepatic negative GI ROS, (+) Hepatitis -, C  Endo/Other  Hyperthyroidism   Renal/GU negative Renal ROS     Musculoskeletal   Abdominal   Peds  Hematology   Anesthesia Other Findings   Reproductive/Obstetrics                           Anesthesia Physical Anesthesia Plan  ASA: III  Anesthesia Plan: MAC   Post-op Pain Management:    Induction:   Airway Management Planned: Simple Face Mask  Additional Equipment:   Intra-op Plan:   Post-operative Plan:   Informed Consent: I have reviewed the patients History and Physical, chart, labs and discussed the procedure including the risks, benefits and alternatives for the proposed anesthesia with the patient or authorized representative who has indicated his/her understanding and acceptance.   Dental advisory given and  Consent reviewed with POA  Plan Discussed with: CRNA, Anesthesiologist and Surgeon  Anesthesia Plan Comments:        Anesthesia Quick Evaluation

## 2016-03-20 ENCOUNTER — Ambulatory Visit (HOSPITAL_COMMUNITY)
Admission: RE | Admit: 2016-03-20 | Discharge: 2016-03-20 | Disposition: A | Discharge status: Home / Self Care | Payer: Medicare Other | Source: Ambulatory Visit | Attending: Internal Medicine | Admitting: Internal Medicine

## 2016-03-20 ENCOUNTER — Encounter (HOSPITAL_COMMUNITY)
Admission: RE | Disposition: A | Discharge status: Home / Self Care | Payer: Self-pay | Source: Ambulatory Visit | Attending: Internal Medicine

## 2016-03-20 ENCOUNTER — Ambulatory Visit (HOSPITAL_COMMUNITY): Payer: Medicare Other | Admitting: Anesthesiology

## 2016-03-20 ENCOUNTER — Encounter (HOSPITAL_COMMUNITY): Payer: Self-pay | Admitting: *Deleted

## 2016-03-20 DIAGNOSIS — K3189 Other diseases of stomach and duodenum: Secondary | ICD-10-CM | POA: Insufficient documentation

## 2016-03-20 DIAGNOSIS — R109 Unspecified abdominal pain: Secondary | ICD-10-CM | POA: Diagnosis not present

## 2016-03-20 DIAGNOSIS — I071 Rheumatic tricuspid insufficiency: Secondary | ICD-10-CM | POA: Insufficient documentation

## 2016-03-20 DIAGNOSIS — I739 Peripheral vascular disease, unspecified: Secondary | ICD-10-CM | POA: Diagnosis not present

## 2016-03-20 DIAGNOSIS — K766 Portal hypertension: Secondary | ICD-10-CM | POA: Insufficient documentation

## 2016-03-20 DIAGNOSIS — I509 Heart failure, unspecified: Secondary | ICD-10-CM | POA: Diagnosis not present

## 2016-03-20 DIAGNOSIS — F17200 Nicotine dependence, unspecified, uncomplicated: Secondary | ICD-10-CM | POA: Insufficient documentation

## 2016-03-20 DIAGNOSIS — Z6821 Body mass index (BMI) 21.0-21.9, adult: Secondary | ICD-10-CM | POA: Insufficient documentation

## 2016-03-20 DIAGNOSIS — I110 Hypertensive heart disease with heart failure: Secondary | ICD-10-CM | POA: Insufficient documentation

## 2016-03-20 DIAGNOSIS — Z8673 Personal history of transient ischemic attack (TIA), and cerebral infarction without residual deficits: Secondary | ICD-10-CM | POA: Diagnosis not present

## 2016-03-20 DIAGNOSIS — R634 Abnormal weight loss: Secondary | ICD-10-CM | POA: Insufficient documentation

## 2016-03-20 HISTORY — PX: ESOPHAGOGASTRODUODENOSCOPY (EGD) WITH PROPOFOL: SHX5813

## 2016-03-20 SURGERY — ESOPHAGOGASTRODUODENOSCOPY (EGD) WITH PROPOFOL
Anesthesia: Monitor Anesthesia Care

## 2016-03-20 MED ORDER — PROPOFOL 10 MG/ML IV BOLUS
INTRAVENOUS | Status: AC
  Filled 2016-03-20: qty 20

## 2016-03-20 MED ORDER — LIDOCAINE VISCOUS 2 % MT SOLN
15.0000 mL | Freq: Once | OROMUCOSAL | Status: AC
  Administered 2016-03-20: 15 mL via OROMUCOSAL

## 2016-03-20 MED ORDER — LIDOCAINE VISCOUS 2 % MT SOLN
OROMUCOSAL | Status: AC
  Filled 2016-03-20: qty 15

## 2016-03-20 MED ORDER — STERILE WATER FOR IRRIGATION IR SOLN
Status: DC | PRN
  Administered 2016-03-20: 200 mL

## 2016-03-20 MED ORDER — PROPOFOL 500 MG/50ML IV EMUL
INTRAVENOUS | Status: DC | PRN
  Administered 2016-03-20: 75 ug/kg/min via INTRAVENOUS

## 2016-03-20 MED ORDER — PROPOFOL 10 MG/ML IV BOLUS: INTRAVENOUS | Status: DC | PRN

## 2016-03-20 MED ORDER — LACTATED RINGERS IV SOLN
INTRAVENOUS | Status: DC
  Administered 2016-03-20: 1000 mL via INTRAVENOUS

## 2016-03-20 MED ORDER — MIDAZOLAM HCL 5 MG/5ML IJ SOLN
INTRAMUSCULAR | Status: DC | PRN
  Administered 2016-03-20: 2 mg via INTRAVENOUS

## 2016-03-20 MED ORDER — MIDAZOLAM HCL 2 MG/2ML IJ SOLN
INTRAMUSCULAR | Status: AC
  Filled 2016-03-20: qty 2

## 2016-03-20 MED ORDER — MIDAZOLAM HCL 2 MG/2ML IJ SOLN: 1.0000 mg | INTRAMUSCULAR | Status: DC | PRN

## 2016-03-20 MED ORDER — EPHEDRINE SULFATE 50 MG/ML IJ SOLN
INTRAMUSCULAR | Status: DC | PRN
  Administered 2016-03-20: 10 mg via INTRAVENOUS

## 2016-03-20 NOTE — H&P (View-Only) (Signed)
Records from [facility] reviewed  Ultrasound 10/26/2015: Mild hepatomegaly, increased echogenicity throughout liver, no focal liver lesions.  July 2017, white blood cell count 4500, hemoglobin 14.3, platelets 247,000, ferritin 28.6, iron 149, TIBC 358, iron saturations 41.6%, albumin 3.6, total bilirubin 0.7, alkaline phosphatase 57, AST 12, ALT 15, AFP 2.03 April 2015, HCV RNA not detected. July 2016 HCV RNA  September 2015 HIV antibody negative  EGD May 2015: H. pylori gastritis. No mention of varices.  Incomplete colonoscopy May 2015: Versed 6 mg, fentanyl 100 g, Benadryl 50 mg Poor prep, visualized colon to the terminal ileum to rule out masses but unable to do adequate screening exam. Repeat colonoscopy in 12 months.  I'll wait for labs and CT scan and then we will proceed with possible EGD and colonoscopy.

## 2016-03-20 NOTE — Op Note (Signed)
[facility] Patient Name: Eric Tran Procedure Date: 03/20/2016 7:22 AM MRN: [id number] Date of Birth: 19-Oct-1950 Attending MD: Norvel Richards , MD CSN: [id number] Age: 65 Admit Type: Outpatient Procedure:                Upper GI endoscopy - incomplete Indications:              Weight loss Providers:                Norvel Richards, MD, Rosina Lowenstein, RN, Randa Spike, Technician Referring MD:              Medicines:                Propofol per Anesthesia Complications:            No immediate complications. Estimated Blood Loss:     Estimated blood loss: none. Procedure:                Pre-Anesthesia Assessment:                           - Prior to the procedure, a History and Physical                            was performed, and patient medications and                            allergies were reviewed. The patient's tolerance of                            previous anesthesia was also reviewed. The risks                            and benefits of the procedure and the sedation                            options and risks were discussed with the patient.                            All questions were answered, and informed consent                            was obtained. Prior Anticoagulants: The patient has                            taken no previous anticoagulant or antiplatelet                            agents. ASA Grade Assessment: III - A patient with                            severe systemic disease. After reviewing the risks  and benefits, the patient was deemed in                            satisfactory condition to undergo the procedure.                           After obtaining informed consent, the endoscope was                            passed under direct vision. Throughout the                            procedure, the patient's blood pressure, pulse, and                            oxygen  saturations were monitored continuously. The                            [id number] scope was introduced through the                            mouth, and advanced to the second part of duodenum.                            The upper GI endoscopy was accomplished with ease.                            The patient tolerated the procedure well. Scope In: 7:45:24 AM Scope Out: [id number] AM Total Procedure Duration: 0 hours 4 minutes 7 seconds  Findings:      The examined esophagus was normal.      Moderate portal hypertensive gastropathy was found in the stomach.      The duodenal bulb and second portion of the duodenum were normal.       Estimated blood loss: none. Impression:               - Normal esophagus.                           - Portal hypertensive gastropathy. Stomach not                            completely seen as there was quite a bit of                            retained food debris.                           - Normal duodenal bulb and second portion of the                            duodenum.                           - No specimens collected. Of note, patient was  prepared for colonoscopy but had formed stool                            coming out of this anus as he was positioned for                            subsequent colonoscopy. In addition, wife was not                            very optimistic about success preparation.                            Consequently,no attempt at colonoscopy performed. Moderate Sedation:      Moderate (conscious) sedation was personally administered by an       anesthesia professional. The following parameters were monitored: oxygen       saturation, heart rate, blood pressure, respiratory rate, EKG, adequacy       of pulmonary ventilation, and response to care. Total physician       intraservice time was 11 minutes. Recommendation:           - Patient has a contact number available for                             emergencies. The signs and symptoms of potential                            delayed complications were discussed with the                            patient. Return to normal activities tomorrow.                            Written discharge instructions were provided to the                            patient.                           - Advance diet as tolerated.                           - Continue present medications.                           - Repeat upper endoscopy in 2 years for screening                            purposes.                           - Return to GI office in 8 weeks. Procedure Code(s):        --- Professional ---                           [id number], Esophagogastroduodenoscopy, flexible,  transoral; diagnostic, including collection of                            specimen(s) by brushing or washing, when performed                            (separate procedure) Diagnosis Code(s):        --- Professional ---                           K76.6, Portal hypertension                           K31.89, Other diseases of stomach and duodenum                           R63.4, Abnormal weight loss CPT copyright 2016 American Medical Association. All rights reserved. The codes documented in this report are preliminary and upon coder review may  be revised to meet current compliance requirements. Cristopher Estimable. Sherrita Riederer, MD Norvel Richards, MD 03/20/2016 7:56:31 AM This report has been signed electronically. Number of Addenda: 0

## 2016-03-20 NOTE — Anesthesia Postprocedure Evaluation (Signed)
Anesthesia Post Note  Patient: Eric Tran  Procedure(s) Performed: Procedure(s) (LRB): ESOPHAGOGASTRODUODENOSCOPY (EGD) WITH PROPOFOL (N/A)  Patient location during evaluation: PACU Anesthesia Type: MAC Level of consciousness: awake and alert Pain management: pain level controlled Vital Signs Assessment: post-procedure vital signs reviewed and stable Respiratory status: spontaneous breathing and respiratory function stable Cardiovascular status: stable Anesthetic complications: no    Last Vitals:  Vitals:   03/20/16 0830 03/20/16 0845  BP: 106/73   Pulse: 94 95  Resp: (!) (P) 8   Temp:      Last Pain:  Vitals:   03/20/16 0652  TempSrc: Oral  PainSc: [place]

## 2016-03-20 NOTE — Discharge Instructions (Signed)
°  Your upper endoscopy was incomplete because your stomach was full of food. Your colonoscopy could not be performed because you were not prepped.  Office visit with [place] in 8 weeks. Follow up on November 16 at 11:00 with Neil Crouch      EGD Discharge instructions Please read the instructions outlined below and refer to this sheet in the next few weeks. These discharge instructions provide you with general information on caring for yourself after you leave the hospital. Your doctor may also give you specific instructions. While your treatment has been planned according to the most current medical practices available, unavoidable complications occasionally occur. If you have any problems or questions after discharge, please call your doctor. ACTIVITY  You may resume your regular activity but move at a slower pace for the next 24 hours.   Take frequent rest periods for the next 24 hours.   Walking will help expel (get rid of) the air and reduce the bloated feeling in your abdomen.   No driving for 24 hours (because of the anesthesia (medicine) used during the test).   You may shower.   Do not sign any important legal documents or operate any machinery for 24 hours (because of the anesthesia used during the test).  NUTRITION  Drink plenty of fluids.   You may resume your normal diet.   Begin with a light meal and progress to your normal diet.   Avoid alcoholic beverages for 24 hours or as instructed by your caregiver.  MEDICATIONS  You may resume your normal medications unless your caregiver tells you otherwise.  WHAT YOU CAN EXPECT TODAY  You may experience abdominal discomfort such as a feeling of fullness or gas pains.  FOLLOW-UP  Your doctor will discuss the results of your test with you.  SEEK IMMEDIATE MEDICAL ATTENTION IF ANY OF THE FOLLOWING OCCUR:  Excessive nausea (feeling sick to your stomach) and/or vomiting.   Severe abdominal pain and distention (swelling).    Trouble swallowing.   Temperature over 101 F (37.8 C).   Rectal bleeding or vomiting of blood.

## 2016-03-20 NOTE — Interval H&P Note (Signed)
History and Physical Interval Note:  03/20/2016 7:29 AM  Eric Tran  has presented today for surgery, with the diagnosis of Abdominal pain, weight loss  The various methods of treatment have been discussed with the patient and family. After consideration of risks, benefits and other options for treatment, the patient has consented to  Procedure(s) with comments: COLONOSCOPY WITH PROPOFOL (N/A) - 7:30 AM ESOPHAGOGASTRODUODENOSCOPY (EGD) WITH PROPOFOL (N/A) as a surgical intervention .  The patient's history has been reviewed, patient examined, no change in status, stable for surgery.  I have reviewed the patient's chart and labs.  Questions were answered to the patient's satisfaction.     [name]  No change however,patient did fall yesterday and has some multiple bruises on his right side. Needs evaluation following these procedures. Should not affect EGD and colonoscopy as discussed with the nursing staff and patients family. The risks, benefits, limitations, imponderables and alternatives regarding both EGD and colonoscopy have been reviewed with the patient. Questions have been answered. All parties agreeable.

## 2016-03-20 NOTE — Transfer of Care (Signed)
Immediate Anesthesia Transfer of Care Note  Patient: Eric Tran  Procedure(s) Performed: Procedure(s): ESOPHAGOGASTRODUODENOSCOPY (EGD) WITH PROPOFOL (N/A)  Patient Location: PACU  Anesthesia Type:MAC  Level of Consciousness: awake, alert  and oriented  Airway & Oxygen Therapy: Patient Spontanous Breathing and Patient connected to face mask oxygen  Post-op Assessment: Report given to RN and Post -op Vital signs reviewed and stable  Post vital signs: Reviewed and stable  Last Vitals:  Vitals:   03/20/16 0730 03/20/16 0800  BP: 105/73 (!) 62/48  Pulse:  66  Resp: 18   Temp:  (P) 36.9 C    Last Pain:  Vitals:   03/20/16 0652  TempSrc: Oral  PainSc: 8          Complications: No apparent anesthesia complications

## 2016-03-20 NOTE — Progress Notes (Addendum)
Multiple abrasions on face, nose. Some oozing blood. Wife states she doesn't know how he got them. Multiple bruises on back, buttocks, left foot from fall in shower yesterday. Left foot swollen. Dr Gala Romney informed. Wife aware.

## 2016-03-25 ENCOUNTER — Encounter (HOSPITAL_COMMUNITY): Payer: Self-pay | Admitting: Internal Medicine

## 2016-04-02 ENCOUNTER — Ambulatory Visit (INDEPENDENT_AMBULATORY_CARE_PROVIDER_SITE_OTHER): Payer: Medicare Other | Admitting: Adult Health

## 2016-04-02 ENCOUNTER — Telehealth: Payer: Self-pay | Admitting: Adult Health

## 2016-04-02 ENCOUNTER — Encounter: Payer: Self-pay | Admitting: Adult Health

## 2016-04-02 VITALS — BP 96/76 | HR 80 | Ht 68.0 in | Wt 136.8 lb

## 2016-04-02 DIAGNOSIS — R569 Unspecified convulsions: Secondary | ICD-10-CM

## 2016-04-02 DIAGNOSIS — R269 Unspecified abnormalities of gait and mobility: Secondary | ICD-10-CM | POA: Diagnosis not present

## 2016-04-02 DIAGNOSIS — Z5181 Encounter for therapeutic drug level monitoring: Secondary | ICD-10-CM | POA: Diagnosis not present

## 2016-04-02 NOTE — Progress Notes (Signed)
I have read the note, and I agree with the clinical assessment and plan.  Eric Tran,Eric Tran   

## 2016-04-02 NOTE — Telephone Encounter (Signed)
Saw pt at 1330 today.

## 2016-04-02 NOTE — Patient Instructions (Signed)
Continue Trileptal and Depakote Blood work today Call if you have more seizures If your symptoms worsen or you develop new symptoms please let us know.

## 2016-04-02 NOTE — Telephone Encounter (Signed)
FYI-Wife April Scantling called to advise, they are running behind due to traffic for 11:00 appointment today w/NP, Jinny Blossom. April advised, patient will still be seen up to 10 minutes past appointment time, after that, it will be up to the discretion of the Provider.
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
## 2016-04-04 LAB — CBC WITH DIFFERENTIAL/PLATELET
Basophils Absolute: 0.1 10*3/uL (ref 0.0–0.2)
Basos: 1 %
EOS (ABSOLUTE): 0.1 10*3/uL (ref 0.0–0.4)
Eos: 2 %
Hematocrit: 40.7 % (ref 37.5–51.0)
Hemoglobin: 14 g/dL (ref 12.6–17.7)
Immature Grans (Abs): 0 10*3/uL (ref 0.0–0.1)
Immature Granulocytes: 0 %
Lymphocytes Absolute: 2.1 10*3/uL (ref 0.7–3.1)
Lymphs: 39 %
MCH: 31.2 pg (ref 26.6–33.0)
MCHC: 34.4 g/dL (ref 31.5–35.7)
MCV: 91 fL (ref 79–97)
Monocytes Absolute: 0.6 10*3/uL (ref 0.1–0.9)
Monocytes: 10 %
Neutrophils Absolute: 2.6 10*3/uL (ref 1.4–7.0)
Neutrophils: 48 %
Platelets: 310 10*3/uL (ref 150–379)
RBC: 4.49 x10E6/uL (ref 4.14–5.80)
RDW: 13.8 % (ref 12.3–15.4)
WBC: 5.5 10*3/uL (ref 3.4–10.8)

## 2016-04-04 LAB — COMPREHENSIVE METABOLIC PANEL
ALT: 12 IU/L (ref 0–44)
AST: 14 IU/L (ref 0–40)
Albumin/Globulin Ratio: 1.6 (ref 1.2–2.2)
Albumin: 4 g/dL (ref 3.6–4.8)
Alkaline Phosphatase: 51 IU/L (ref 39–117)
BUN/Creatinine Ratio: 14 (ref 10–24)
BUN: 11 mg/dL (ref 8–27)
Bilirubin Total: 0.4 mg/dL (ref 0.0–1.2)
CO2: 30 mmol/L — ABNORMAL HIGH (ref 18–29)
Calcium: 9 mg/dL (ref 8.6–10.2)
Chloride: 102 mmol/L (ref 96–106)
Creatinine, Ser: 0.79 mg/dL (ref 0.76–1.27)
GFR calc Af Amer: 109 mL/min/{1.73_m2} (ref >59)
GFR calc non Af Amer: 94 mL/min/{1.73_m2} (ref >59)
Globulin, Total: 2.5 g/dL (ref 1.5–4.5)
Glucose: 101 mg/dL — ABNORMAL HIGH (ref 65–99)
Potassium: 4.4 mmol/L (ref 3.5–5.2)
Sodium: 145 mmol/L — ABNORMAL HIGH (ref 134–144)
Total Protein: 6.5 g/dL (ref 6.0–8.5)

## 2016-04-04 LAB — VALPROIC ACID LEVEL: Valproic Acid Lvl: 19 ug/mL — ABNORMAL LOW (ref 50–100)

## 2016-04-04 LAB — AMMONIA: Ammonia: 74 ug/dL (ref 27–102)

## 2016-04-04 LAB — 10-HYDROXYCARBAZEPINE: Oxcarbazepine SerPl-Mcnc: 5 ug/mL — ABNORMAL LOW (ref 10–35)

## 2016-04-05 LAB — CUP PACEART REMOTE DEVICE CHECK: Date Time Interrogation Session: 20170910233933

## 2016-04-05 NOTE — Progress Notes (Signed)
Carelink summary report received. Battery status OK. Normal device function. No new symptom episodes, tachy episodes, brady, or pause episodes. No new AF episodes. Monthly summary reports and ROV/PRN 

## 2016-04-07 ENCOUNTER — Telehealth: Payer: Self-pay | Admitting: *Deleted

## 2016-04-07 NOTE — Telephone Encounter (Signed)
-----   Message from Ward Givens, NP sent at 04/07/2016  8:04 AM EDT ----- Lab work unremarkable. Please call patient.

## 2016-04-07 NOTE — Telephone Encounter (Signed)
Spoke to wife , April, [place] per DPR  To relay results of labs.  The results were unremarkable.  She would let him know.

## 2016-04-08 ENCOUNTER — Ambulatory Visit (INDEPENDENT_AMBULATORY_CARE_PROVIDER_SITE_OTHER): Payer: Medicare Other | Admitting: *Deleted

## 2016-04-08 DIAGNOSIS — I6310 Cerebral infarction due to embolism of unspecified precerebral artery: Secondary | ICD-10-CM

## 2016-04-09 NOTE — Progress Notes (Signed)
Carelink Summary Report / Loop Recorder 

## 2016-05-08 ENCOUNTER — Ambulatory Visit (INDEPENDENT_AMBULATORY_CARE_PROVIDER_SITE_OTHER): Payer: Medicare Other | Admitting: *Deleted

## 2016-05-08 DIAGNOSIS — I6310 Cerebral infarction due to embolism of unspecified precerebral artery: Secondary | ICD-10-CM

## 2016-05-09 NOTE — Progress Notes (Signed)
Carelink Summary Report / Loop Recorder 

## 2016-05-11 LAB — CUP PACEART REMOTE DEVICE CHECK
Date Time Interrogation Session: 20171011003819
Implantable Pulse Generator Implant Date: 20160518

## 2016-05-11 NOTE — Progress Notes (Signed)
Carelink summary report received. Battery status OK. Normal device function. No new symptom episodes, tachy episodes, brady, or pause episodes. No new AF episodes. Monthly summary reports and ROV/PRN 

## 2016-05-15 ENCOUNTER — Ambulatory Visit: Payer: Self-pay | Admitting: Gastroenterology

## 2016-05-15 DIAGNOSIS — G894 Chronic pain syndrome: Secondary | ICD-10-CM | POA: Diagnosis not present

## 2016-05-15 DIAGNOSIS — G890 Central pain syndrome: Secondary | ICD-10-CM | POA: Diagnosis not present

## 2016-05-15 DIAGNOSIS — M5412 Radiculopathy, cervical region: Secondary | ICD-10-CM | POA: Diagnosis not present

## 2016-05-20 ENCOUNTER — Encounter: Payer: Self-pay | Admitting: Gastroenterology

## 2016-05-20 ENCOUNTER — Ambulatory Visit (INDEPENDENT_AMBULATORY_CARE_PROVIDER_SITE_OTHER): Payer: Medicare Other | Admitting: Gastroenterology

## 2016-05-20 VITALS — BP 172/107 | HR 83 | Temp 97.7°F | Ht 68.0 in | Wt 131.0 lb

## 2016-05-20 DIAGNOSIS — Z1211 Encounter for screening for malignant neoplasm of colon: Secondary | ICD-10-CM

## 2016-05-20 DIAGNOSIS — R634 Abnormal weight loss: Secondary | ICD-10-CM | POA: Diagnosis not present

## 2016-05-20 DIAGNOSIS — K760 Fatty (change of) liver, not elsewhere classified: Secondary | ICD-10-CM

## 2016-05-20 NOTE — Assessment & Plan Note (Addendum)
We'll continue surveillance with labs in March for hepatic function as well as abdominal ultrasound. Patient with likely significant fibrosis, cannot exclude element of cirrhosis given portal HTN gastropathy on EGD although no concrete evidence of cirrhosis based on recent imaging. He has good hepatic function based on most recent labs.

## 2016-05-20 NOTE — Assessment & Plan Note (Signed)
Fluctuating weight, overall his weight is consistent with that over the past 2 years outside from weight increase in May. Continue to encourage oral intake. Proceed with Cologuard testing as planned. CT done. CXR ok earlier this year.

## 2016-05-20 NOTE — Assessment & Plan Note (Signed)
Patient has now had 2 incomplete colonoscopy since 2015 due to inadequate bowel prep. Discussed in depth with patient and his wife. We could consider a third attempt any time in the next several months or consider Cologuard testing. If testing is positive, would need to pursue complete colonoscopy. They prefer Cologuard testing at this time.
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
## 2016-05-20 NOTE — Patient Instructions (Signed)
1. Cologuard testing as discussed. Further recommendations once results received.  2. We will contact you in March for labs and u/s of liver.

## 2016-05-21 NOTE — Progress Notes (Signed)
Patient needs the following in 08/2016 abd u/s: dx ?early cirrhosis, hepatoma surveillance Labs: CBC, CMET, PT/INR.

## 2016-05-26 NOTE — Progress Notes (Signed)
CC'D TO PCP °

## 2016-05-26 NOTE — Progress Notes (Signed)
ON RECALL FOR ULTRASOUND  °

## 2016-05-28 DIAGNOSIS — Z23 Encounter for immunization: Secondary | ICD-10-CM | POA: Diagnosis not present

## 2016-05-28 DIAGNOSIS — Z720 Tobacco use: Secondary | ICD-10-CM | POA: Diagnosis not present

## 2016-05-28 DIAGNOSIS — Z87820 Personal history of traumatic brain injury: Secondary | ICD-10-CM | POA: Diagnosis not present

## 2016-06-03 ENCOUNTER — Other Ambulatory Visit: Payer: Self-pay | Admitting: Gastroenterology

## 2016-06-03 DIAGNOSIS — K760 Fatty (change of) liver, not elsewhere classified: Secondary | ICD-10-CM

## 2016-06-03 NOTE — Progress Notes (Signed)
Lab order on file. 

## 2016-06-07 DIAGNOSIS — Z1212 Encounter for screening for malignant neoplasm of rectum: Secondary | ICD-10-CM | POA: Diagnosis not present

## 2016-06-07 DIAGNOSIS — Z1211 Encounter for screening for malignant neoplasm of colon: Secondary | ICD-10-CM | POA: Diagnosis not present

## 2016-06-08 ENCOUNTER — Encounter: Payer: Self-pay | Admitting: Gastroenterology

## 2016-06-09 ENCOUNTER — Ambulatory Visit (INDEPENDENT_AMBULATORY_CARE_PROVIDER_SITE_OTHER): Payer: Medicare Other | Admitting: *Deleted

## 2016-06-09 DIAGNOSIS — I6310 Cerebral infarction due to embolism of unspecified precerebral artery: Secondary | ICD-10-CM | POA: Diagnosis not present

## 2016-06-10 NOTE — Progress Notes (Signed)
Carelink Summary Report / Loop Recorder 

## 2016-06-16 LAB — COLOGUARD

## 2016-06-25 LAB — CUP PACEART REMOTE DEVICE CHECK
Date Time Interrogation Session: 20171110013600
Implantable Pulse Generator Implant Date: 20160518

## 2016-06-26 DIAGNOSIS — M5412 Radiculopathy, cervical region: Secondary | ICD-10-CM | POA: Diagnosis not present

## 2016-06-26 DIAGNOSIS — G894 Chronic pain syndrome: Secondary | ICD-10-CM | POA: Diagnosis not present

## 2016-06-26 DIAGNOSIS — G890 Central pain syndrome: Secondary | ICD-10-CM | POA: Diagnosis not present

## 2016-06-26 DIAGNOSIS — M961 Postlaminectomy syndrome, not elsewhere classified: Secondary | ICD-10-CM | POA: Diagnosis not present

## 2016-06-26 DIAGNOSIS — M545 Low back pain: Secondary | ICD-10-CM | POA: Diagnosis not present

## 2016-07-07 ENCOUNTER — Ambulatory Visit (INDEPENDENT_AMBULATORY_CARE_PROVIDER_SITE_OTHER): Payer: Medicare Other | Admitting: *Deleted

## 2016-07-07 DIAGNOSIS — I6310 Cerebral infarction due to embolism of unspecified precerebral artery: Secondary | ICD-10-CM | POA: Diagnosis not present

## 2016-07-08 NOTE — Progress Notes (Signed)
Carelink Summary Report / Loop Recorder 

## 2016-07-16 ENCOUNTER — Telehealth: Payer: Self-pay | Admitting: Gastroenterology

## 2016-07-16 NOTE — Telephone Encounter (Signed)
Please let patient know his Cologuard test was negative indicating lower likelihood of colorectal cancer or advanced adenoma.   Consider yearly hemoccults Cologuard testing again 05/2019.

## 2016-07-21 NOTE — Telephone Encounter (Signed)
Tried to call pt- call would not go thru. 

## 2016-07-23 DIAGNOSIS — M5412 Radiculopathy, cervical region: Secondary | ICD-10-CM | POA: Diagnosis not present

## 2016-07-23 DIAGNOSIS — M961 Postlaminectomy syndrome, not elsewhere classified: Secondary | ICD-10-CM | POA: Diagnosis not present

## 2016-07-23 DIAGNOSIS — G894 Chronic pain syndrome: Secondary | ICD-10-CM | POA: Diagnosis not present

## 2016-07-23 NOTE — Telephone Encounter (Signed)
Tried to call pt, call would not go thru

## 2016-07-23 NOTE — Telephone Encounter (Signed)
Mailed letter to the pt. 

## 2016-07-29 LAB — CUP PACEART REMOTE DEVICE CHECK
Date Time Interrogation Session: 20171210023720
Implantable Pulse Generator Implant Date: 20160518

## 2016-07-29 NOTE — Progress Notes (Signed)
Carelink summary report received. Battery status OK. Normal device function. No new symptom episodes, tachy episodes, brady, or pause episodes. No new AF episodes. Monthly summary reports and ROV/PRN 

## 2016-07-31 DIAGNOSIS — I714 Abdominal aortic aneurysm, without rupture: Secondary | ICD-10-CM | POA: Diagnosis not present

## 2016-07-31 DIAGNOSIS — G459 Transient cerebral ischemic attack, unspecified: Secondary | ICD-10-CM | POA: Diagnosis not present

## 2016-07-31 DIAGNOSIS — Z87820 Personal history of traumatic brain injury: Secondary | ICD-10-CM | POA: Diagnosis not present

## 2016-08-06 ENCOUNTER — Ambulatory Visit (INDEPENDENT_AMBULATORY_CARE_PROVIDER_SITE_OTHER): Payer: Medicare Other | Admitting: *Deleted

## 2016-08-06 DIAGNOSIS — I6310 Cerebral infarction due to embolism of unspecified precerebral artery: Secondary | ICD-10-CM

## 2016-08-07 ENCOUNTER — Telehealth: Payer: Self-pay | Admitting: Internal Medicine

## 2016-08-07 NOTE — Telephone Encounter (Signed)
Letter mailed

## 2016-08-07 NOTE — Progress Notes (Signed)
Carelink Summary Report / Loop Recorder 

## 2016-08-07 NOTE — Telephone Encounter (Signed)
Recall for ultrasound 

## 2016-08-11 ENCOUNTER — Other Ambulatory Visit: Payer: Self-pay

## 2016-08-11 DIAGNOSIS — K760 Fatty (change of) liver, not elsewhere classified: Secondary | ICD-10-CM

## 2016-08-14 ENCOUNTER — Telehealth: Payer: Self-pay | Admitting: Gastroenterology

## 2016-08-14 DIAGNOSIS — A048 Other specified bacterial intestinal infections: Secondary | ICD-10-CM

## 2016-08-14 NOTE — Telephone Encounter (Signed)
I believe patient should be coming up soon for recall u/s.  Can we also have him complete H.pylori stool antigen to make sure completely eradicated. He will need to be off PPIs/antibiotics for 2 weeks.

## 2016-08-19 LAB — CUP PACEART REMOTE DEVICE CHECK
Date Time Interrogation Session: 20180109030822
Implantable Pulse Generator Implant Date: 20160518

## 2016-08-27 NOTE — Telephone Encounter (Signed)
I have mailed the orders, collection container and letter to the pt.

## 2016-08-27 NOTE — Addendum Note (Signed)
Addended by: Claudina Lick on: 08/27/2016 02:48 PM   Modules accepted: Orders

## 2016-08-28 DIAGNOSIS — Z0000 Encounter for general adult medical examination without abnormal findings: Secondary | ICD-10-CM | POA: Diagnosis not present

## 2016-08-31 LAB — CUP PACEART REMOTE DEVICE CHECK
Date Time Interrogation Session: 20180208030905
Implantable Pulse Generator Implant Date: 20160518

## 2016-08-31 NOTE — Progress Notes (Signed)
Carelink summary report received. Battery status OK. Normal device function. No new symptom episodes, tachy episodes, brady, or pause episodes. No new AF episodes. Monthly summary reports and ROV/PRN 

## 2016-09-03 DIAGNOSIS — K760 Fatty (change of) liver, not elsewhere classified: Secondary | ICD-10-CM | POA: Diagnosis not present

## 2016-09-03 DIAGNOSIS — E039 Hypothyroidism, unspecified: Secondary | ICD-10-CM | POA: Diagnosis not present

## 2016-09-03 DIAGNOSIS — R5383 Other fatigue: Secondary | ICD-10-CM | POA: Diagnosis not present

## 2016-09-04 DIAGNOSIS — A048 Other specified bacterial intestinal infections: Secondary | ICD-10-CM | POA: Diagnosis not present

## 2016-09-05 ENCOUNTER — Ambulatory Visit (INDEPENDENT_AMBULATORY_CARE_PROVIDER_SITE_OTHER): Payer: Medicare Other | Admitting: *Deleted

## 2016-09-05 DIAGNOSIS — I6310 Cerebral infarction due to embolism of unspecified precerebral artery: Secondary | ICD-10-CM | POA: Diagnosis not present

## 2016-09-05 LAB — PROTIME-INR

## 2016-09-05 LAB — HELICOBACTER PYLORI  SPECIAL ANTIGEN: H. PYLORI Antigen: NOT DETECTED

## 2016-09-09 NOTE — Progress Notes (Signed)
Carelink Summary Report / Loop Recorder 

## 2016-09-09 NOTE — Telephone Encounter (Signed)
H. Pylori eradicated.

## 2016-09-11 ENCOUNTER — Other Ambulatory Visit: Payer: Self-pay

## 2016-09-11 DIAGNOSIS — K7460 Unspecified cirrhosis of liver: Secondary | ICD-10-CM

## 2016-09-11 NOTE — Telephone Encounter (Signed)
Pt's wife, April, is aware.

## 2016-09-16 ENCOUNTER — Ambulatory Visit (HOSPITAL_COMMUNITY)
Admission: RE | Admit: 2016-09-16 | Discharge: 2016-09-16 | Disposition: A | Discharge status: Home / Self Care | Payer: Medicare Other | Source: Ambulatory Visit | Attending: Gastroenterology | Admitting: Gastroenterology

## 2016-09-16 DIAGNOSIS — K7460 Unspecified cirrhosis of liver: Secondary | ICD-10-CM | POA: Diagnosis not present

## 2016-09-16 DIAGNOSIS — I77811 Abdominal aortic ectasia: Secondary | ICD-10-CM | POA: Diagnosis not present

## 2016-09-16 DIAGNOSIS — N200 Calculus of kidney: Secondary | ICD-10-CM | POA: Diagnosis not present

## 2016-09-18 DIAGNOSIS — M5412 Radiculopathy, cervical region: Secondary | ICD-10-CM | POA: Diagnosis not present

## 2016-09-18 DIAGNOSIS — M961 Postlaminectomy syndrome, not elsewhere classified: Secondary | ICD-10-CM | POA: Diagnosis not present

## 2016-09-18 DIAGNOSIS — G890 Central pain syndrome: Secondary | ICD-10-CM | POA: Diagnosis not present

## 2016-09-18 DIAGNOSIS — G894 Chronic pain syndrome: Secondary | ICD-10-CM | POA: Diagnosis not present

## 2016-09-18 LAB — CUP PACEART REMOTE DEVICE CHECK
Date Time Interrogation Session: 20180310033949
Implantable Pulse Generator Implant Date: 20160518

## 2016-09-25 ENCOUNTER — Telehealth: Payer: Self-pay | Admitting: Gastroenterology

## 2016-09-25 NOTE — Telephone Encounter (Signed)
Labs dated 09/03/2016 White blood cell count 6600, hemoglobin 14.1, hematocrit 41.5, platelets 192,000, glucose 73, BUN 10, creatinine 0.67, total bilirubin 0.4, alkaline phosphatase 44, AST 19, ALT 12, albumin 4, TSH 1.080, INR 1.  Labs look great.

## 2016-09-25 NOTE — Progress Notes (Signed)
No liver tumors. Right kidney stone without obstruction. Mild dilation of proximal abd aorta.   -->needs ov with rmr in may. -->Abd u/s in six months for DX: h/o chronic hep c, fibrosis.  -->Needs to have PCP follow him for dilation of proximal abdominal aorta: please notify PCP and send copy of u/s.

## 2016-09-29 ENCOUNTER — Encounter: Payer: Self-pay | Admitting: Internal Medicine

## 2016-09-29 NOTE — Telephone Encounter (Signed)
Letter mailed to the pt. 

## 2016-09-29 NOTE — Progress Notes (Signed)
APPT MADE AND LETTER SENT  °

## 2016-10-02 ENCOUNTER — Encounter: Payer: Self-pay | Admitting: Neurology

## 2016-10-02 ENCOUNTER — Ambulatory Visit (INDEPENDENT_AMBULATORY_CARE_PROVIDER_SITE_OTHER): Payer: Medicare Other | Admitting: Neurology

## 2016-10-02 VITALS — BP 114/79 | HR 115 | Ht 68.0 in | Wt 129.0 lb

## 2016-10-02 DIAGNOSIS — R269 Unspecified abnormalities of gait and mobility: Secondary | ICD-10-CM | POA: Diagnosis not present

## 2016-10-02 DIAGNOSIS — G250 Essential tremor: Secondary | ICD-10-CM

## 2016-10-02 DIAGNOSIS — G40209 Localization-related (focal) (partial) symptomatic epilepsy and epileptic syndromes with complex partial seizures, not intractable, without status epilepticus: Secondary | ICD-10-CM | POA: Diagnosis not present

## 2016-10-02 DIAGNOSIS — R413 Other amnesia: Secondary | ICD-10-CM | POA: Diagnosis not present

## 2016-10-02 HISTORY — DX: Essential tremor: G25.0

## 2016-10-02 MED ORDER — BACLOFEN 10 MG PO TABS: 10.0000 mg | ORAL_TABLET | Freq: Three times a day (TID) | ORAL | 3 refills | Status: DC

## 2016-10-02 NOTE — Patient Instructions (Signed)
   With the Sinemet (carbidopa) 25/100 mg tablet, begin 1/2 tablet three times a day for 3 weeks, then stop the medication.   After another three weeks, reduce the baclofen 10 mg to 1/2 tablet three times a day for 3 weeks, then take 1/2 tablet twice a day for 2 weeks, then stop the medication.

## 2016-10-02 NOTE — Progress Notes (Signed)
Reason for visit: Seizures  Eric Tran is an 66 y.o. male  History of present illness:  Eric Tran is a 66 year old right-handed white male with a history of chronic back pain, with chronic opioid use. The patient has a history of hepatitis C with cirrhosis of the liver. The patient has a history of seizures, he has been on low-dose Depakote and Trileptal. He has not had any seizures since last seen. He does not operate a motor vehicle. He recently was placed on methadone, but he developed a delirium state associated with this. The patient has been on Sinemet 25/100 mg tablets, one tablet 3 times a day for what appears to be an essential tremor. The patient was given this medication through the [facility]. The patient also takes baclofen that he states is for pain. The patient has had a lot of drowsiness during the day on these medications. The patient returns for an evaluation. He is followed through a pain center for his chronic pain issues. The patient walks with a cane, he has had some falls recently.  Past Medical History:  Diagnosis Date  . Abdominal aortic aneurysm ([place])   . Abnormality of gait 10/03/2014  . Anxiety   . Arthritis   . BPH (benign prostatic hyperplasia)   . Carpal tunnel syndrome   . Cerebrovascular disease 10/03/2014  . Chronic back pain    chronic narcotic use  . Chronic hepatitis C ([place]) 1980   Harvoni 2016, cured. HCV RNA negative 01/2016  . Chronic pain   . Degenerative disc disease   . Depression   . Head trauma   . Hepatic cirrhosis ([place]) 02/19/2016  . Hypertension   . Hyperthyroidism   . Memory difficulties 10/03/2014  . Partial complex seizure disorder without intractable epilepsy ([place]) 10/03/2014  . Seizures ([place])    Epilepsy  . Stroke [facility]) 2012   left hemiplegia  . TIA (transient ischemic attack)   . Tremor   . UTI (lower urinary tract infection)     Past Surgical History:  Procedure Laterality Date  . EP IMPLANTABLE DEVICE N/A 11/15/2014   Procedure: Loop Recorder Insertion;  Surgeon: Thompson Grayer, MD;  Location: [place] [facility];  Service: Cardiovascular;  Laterality: N/A;  . ESOPHAGOGASTRODUODENOSCOPY (EGD) WITH PROPOFOL N/A 03/20/2016   Procedure: ESOPHAGOGASTRODUODENOSCOPY (EGD) WITH PROPOFOL;  Surgeon: Daneil Dolin, MD;  [facility];  Service: Endoscopy;  Laterality: N/A;  . HAND SURGERY     right  . NECK SURGERY    . right hand reconstruction      Family History  Problem Relation Age of Onset  . Cervical cancer Mother   . Heart failure Father   . Lung cancer Sister   . Breast cancer Sister   . Colon cancer Neg Hx   . Colon polyps Neg Hx     Social history:  reports that he has been smoking Cigarettes.  He started smoking about 54 years ago. He has a 4.50 pack-year smoking history. He has never used smokeless tobacco. He reports that he does not drink alcohol or use drugs.    Allergies  Allergen Reactions  . Keppra [Levetiracetam] Other (See Comments)    Suicidal ideation, anaphylaxsis  . Adhesive [Tape]     Severe rash  . Morphine And Related Nausea And Vomiting    Can only take through IV.    Medications:  Prior to Admission medications   Medication Sig Start Date End Date Taking? Authorizing Provider  albuterol (PROVENTIL HFA;VENTOLIN HFA) 108 (90 Base) MCG/ACT inhaler Inhale 2 puffs into the lungs every 6 (six) hours as needed for wheezing or shortness of breath. 01/31/16  Yes Arnoldo Lenis, MD  ALPRAZolam Duanne Moron) 0.25 MG tablet Take 0.25 mg by mouth 3 (three) times daily as needed for anxiety.   Yes Historical Provider, MD  baclofen (LIORESAL) 10 MG tablet Take 1 tablet by mouth 3 (three) times daily. 09/18/15  Yes Historical Provider, MD  carbidopa-levodopa (SINEMET IR) 25-100 MG tablet Take 1 tablet by mouth 3 (three) times daily.   Yes Historical Provider, MD  clopidogrel (PLAVIX) 75 MG tablet Take 75 mg by mouth daily. Reported on 10/03/2015   Yes Historical Provider, MD  divalproex  (DEPAKOTE) 500 MG DR tablet Take 500 mg by mouth 2 (two) times daily.    Yes Historical Provider, MD  docusate sodium (COLACE) 100 MG capsule Take 100 mg by mouth daily.   Yes Historical Provider, MD  DULoxetine (CYMBALTA) 30 MG capsule Take 20 mg by mouth daily. 05/15/16 05/15/17 Yes Historical Provider, MD  hydrALAZINE (APRESOLINE) 25 MG tablet Take 25 mg by mouth 2 (two) times daily.   Yes Historical Provider, MD  lisinopril (PRINIVIL,ZESTRIL) 40 MG tablet Take 40 mg by mouth daily.   Yes Historical Provider, MD  Oxcarbazepine (TRILEPTAL) 300 MG tablet Take 300 mg by mouth 2 (two) times daily.   Yes Historical Provider, MD  Oxycodone HCl 10 MG TABS Take 10 mg by mouth every 6 (six) hours as needed. Takes 1 1/2 tab as needed 05/15/16  Yes Historical Provider, MD  sildenafil (VIAGRA) 50 MG tablet Take 25 mg by mouth daily as needed for erectile dysfunction.    Yes Historical Provider, MD  Tamsulosin HCl (FLOMAX) 0.4 MG CAPS Take 0.4 mg by mouth daily.    Yes Historical Provider, MD  tiotropium (SPIRIVA HANDIHALER) 18 MCG inhalation capsule Place 1 capsule (18 mcg total) into inhaler and inhale daily. 01/31/16  Yes Arnoldo Lenis, MD  UNABLE TO FIND 5 mg daily. Thyroid   Yes Historical Provider, MD    ROS:  Out of a complete 14 system review of symptoms, the patient complains only of the following symptoms, and all other reviewed systems are negative.  Fatigue Runny nose Eye itching Wheezing, shortness of breath, choking Leg swelling Cold intolerance Constipation Insomnia, frequent waking, snoring, sleep talking, sleepwalking Incontinence of the bladder, blood in the urine Joint pain, back pain, aching muscles, muscle cramps, walking difficulty Itching Bruising easily Memory loss, headache, numbness, tremors Agitation, behavior problems, confusion, depression, anxiety, hallucinations, hyperactivity  There were no vitals taken for this visit.  Physical Exam  General: The patient  is alert and cooperative at the time of the examination.  Skin: No significant peripheral edema is noted.   Neurologic Exam  Mental status: The patient is alert and oriented x 3 at the time of the examination. The patient has apparent normal recent and remote memory, with an apparently normal attention span and concentration ability.   Cranial nerves: Facial symmetry is present. Speech is normal, no aphasia or dysarthria is noted. Extraocular movements are full. Visual fields are full.  Motor: The patient has good strength in all 4 extremities.  Sensory examination: Soft touch sensation is symmetric on the face, arms, and legs.  Coordination: The patient has good finger-nose-finger and heel-to-shin bilaterally. Mild intention tremors are seen. The patient has minimal tremor translated into handwriting.  Gait and station: The patient has a slightly wide-based  gait, the patient usually uses a cane for ambulation. Tandem gait is unsteady. Romberg is negative. No drift is seen.  Reflexes: Deep tendon reflexes are symmetric.   Assessment/Plan:  1. Chronic low back pain, chronic opioid use  2. History seizures  3. Gait disorder  4. Essential tremor  The patient is on several medications that are probably not necessary. The patient will be tapered down off of the Sinemet taking one half of the 25/100 mg tablet 3 times daily for 3 weeks, then stop the medication. After another 3 weeks, he will begin a taper off of the baclofen taking one half of the 10 mg tablets 3 times daily for 3 weeks, then go to one half tablet twice daily for 2 weeks, then stop the medication. They are to contact me if any issues arise. A prescription for the baclofen was called in. The patient will follow-up in 6 months. In the future if need be the Trileptal dose can be increased. He has not been able to tolerate higher doses of Depakote secondary to hyperammonemia.   Jill Alexanders MD 10/02/2016 10:51  AM  [facility] [address] [place], [place] [address]  Phone [phone] Fax [phone]

## 2016-10-06 ENCOUNTER — Ambulatory Visit (INDEPENDENT_AMBULATORY_CARE_PROVIDER_SITE_OTHER): Payer: Medicare Other | Admitting: *Deleted

## 2016-10-06 DIAGNOSIS — I6310 Cerebral infarction due to embolism of unspecified precerebral artery: Secondary | ICD-10-CM

## 2016-10-07 NOTE — Progress Notes (Signed)
Carelink Summary Report / Loop Recorder 

## 2016-10-10 DIAGNOSIS — M5412 Radiculopathy, cervical region: Secondary | ICD-10-CM | POA: Diagnosis not present

## 2016-10-10 DIAGNOSIS — M961 Postlaminectomy syndrome, not elsewhere classified: Secondary | ICD-10-CM | POA: Diagnosis not present

## 2016-10-10 DIAGNOSIS — G894 Chronic pain syndrome: Secondary | ICD-10-CM | POA: Diagnosis not present

## 2016-10-10 DIAGNOSIS — G8929 Other chronic pain: Secondary | ICD-10-CM | POA: Diagnosis not present

## 2016-10-10 DIAGNOSIS — M545 Low back pain: Secondary | ICD-10-CM | POA: Diagnosis not present

## 2016-10-17 LAB — CUP PACEART REMOTE DEVICE CHECK
Date Time Interrogation Session: 20180409043621
Implantable Pulse Generator Implant Date: 20160518

## 2016-11-05 ENCOUNTER — Ambulatory Visit (INDEPENDENT_AMBULATORY_CARE_PROVIDER_SITE_OTHER): Payer: Medicare Other | Admitting: *Deleted

## 2016-11-05 DIAGNOSIS — I6310 Cerebral infarction due to embolism of unspecified precerebral artery: Secondary | ICD-10-CM | POA: Diagnosis not present

## 2016-11-05 NOTE — Progress Notes (Signed)
Carelink Summary Report / Loop Recorder 

## 2016-11-07 ENCOUNTER — Ambulatory Visit: Payer: Medicaid Other | Admitting: Internal Medicine

## 2016-11-07 DIAGNOSIS — G890 Central pain syndrome: Secondary | ICD-10-CM | POA: Diagnosis not present

## 2016-11-07 DIAGNOSIS — M545 Low back pain: Secondary | ICD-10-CM | POA: Diagnosis not present

## 2016-11-07 DIAGNOSIS — G894 Chronic pain syndrome: Secondary | ICD-10-CM | POA: Diagnosis not present

## 2016-11-07 DIAGNOSIS — M5412 Radiculopathy, cervical region: Secondary | ICD-10-CM | POA: Diagnosis not present

## 2016-11-07 DIAGNOSIS — M961 Postlaminectomy syndrome, not elsewhere classified: Secondary | ICD-10-CM | POA: Diagnosis not present

## 2016-11-14 ENCOUNTER — Telehealth: Payer: Self-pay | Admitting: Cardiology

## 2016-11-14 ENCOUNTER — Encounter: Payer: Self-pay | Admitting: Cardiology

## 2016-11-14 ENCOUNTER — Ambulatory Visit (INDEPENDENT_AMBULATORY_CARE_PROVIDER_SITE_OTHER): Payer: Medicare Other | Admitting: Cardiology

## 2016-11-14 ENCOUNTER — Encounter: Payer: Self-pay | Admitting: *Deleted

## 2016-11-14 VITALS — BP 121/79 | HR 92 | Ht 68.0 in | Wt 128.0 lb

## 2016-11-14 DIAGNOSIS — R0602 Shortness of breath: Secondary | ICD-10-CM

## 2016-11-14 DIAGNOSIS — I517 Cardiomegaly: Secondary | ICD-10-CM

## 2016-11-14 DIAGNOSIS — R001 Bradycardia, unspecified: Secondary | ICD-10-CM

## 2016-11-14 DIAGNOSIS — Z8673 Personal history of transient ischemic attack (TIA), and cerebral infarction without residual deficits: Secondary | ICD-10-CM | POA: Diagnosis not present

## 2016-11-14 LAB — CUP PACEART REMOTE DEVICE CHECK
Date Time Interrogation Session: 20180509060652
Implantable Pulse Generator Implant Date: 20160518

## 2016-11-14 NOTE — Progress Notes (Signed)
Clinical Summary Eric Tran is a 66 y.o.male  seen today for follow up of the following medical problems.   1. Hx of CVA - patient with history of multiple CVAs in the past. According to neurology notes has had a pontine stroke, left cerebellar stroke, bilateral thalamic strokes, a left caudate stroke, right posterior limb of internal capsule stroke, right frontal stroke.  . - based on high concern for possible cardioembolic etiology, patient was originally referred for outpatient cardiac monitoring for possible afib/aflutter. He preferred an implanted loop recorder as opposed to external monitor, this was placed by Dr Rayann Heman   - normal loop recorder checks since last visit  2. Left ventricular hypertrophy - dynamic gradient noted on echo, symmetric moderate hypertrophy and hyperdynamic LV is described.   - he is on propanlol for headaches, we increased the dose last visit due to his dynamic gradient, tolerated well.   - propanolol discontinued after recent admission with near syncope and significant pause on loop recorder.  - chronic SOB unchanged. No significant chest pain, no syncope.   - echo 2016 wall thickness 37mm, hyperdynamic LV. Provoked dynamic gradient of 9mmHg with chordal SAM - no recent chest pain, no syncope. Has had some recent SOB/DOE that is worsening.   3. OSA - sleep study at [facility] - does not use CPAP machine due to discomfort, has not had refitting. Followed at the [facility]  4. Presyncope - admit 10/29/15 to [facility] with presyncope episode - significant pause noted on loop interrogation. Discussed with Dr Rayann Heman during the admission by the primary team, he also updated me on the findings. - propanolol discontinued during recent admission.  - episode occurred around 1130AM while getting ready for doctors appointment. He walked outside to car, while sitting in car became very diaphoretic, limited responsiveness. Remained this way for some time while  he was initially evaluated in his pcp's office and then also at follow up.   - no recurrent episodes since last visit.    5. COPD - compliant with inhalers - PFTs mild COPD    6. SOB - increasing over the last few weeks - SOB just walking room to room at home. - occasional coughing, occasional wheezing.  - occasional LE edema  7. AAA - 3.3 cm noted on previous imagaing.   Past Medical History:  Diagnosis Date  . Abdominal aortic aneurysm ([place])   . Abnormality of gait 10/03/2014  . Anxiety   . Arthritis   . BPH (benign prostatic hyperplasia)   . Carpal tunnel syndrome   . Cerebrovascular disease 10/03/2014  . Chronic back pain    chronic narcotic use  . Chronic hepatitis C ([place]) 1980   Harvoni 2016, cured. HCV RNA negative 01/2016  . Chronic pain   . Degenerative disc disease   . Depression   . Head trauma   . Hepatic cirrhosis ([place]) 02/19/2016  . Hypertension   . Hyperthyroidism   . Memory difficulties 10/03/2014  . Partial complex seizure disorder without intractable epilepsy ([place]) 10/03/2014  . Seizures ([place])    Epilepsy  . Stroke [facility]) 2012   left hemiplegia  . TIA (transient ischemic attack)   . Tremor   . Tremor, essential 10/02/2016  . UTI (lower urinary tract infection)      Allergies  Allergen Reactions  . Keppra [Levetiracetam] Other (See Comments)    Suicidal ideation, anaphylaxsis  . Adhesive [Tape]     Severe rash  . Methadone  Delerium  . Morphine And Related Nausea And Vomiting    Can only take through IV.     Current Outpatient Prescriptions  Medication Sig Dispense Refill  . albuterol (PROVENTIL HFA;VENTOLIN HFA) 108 (90 Base) MCG/ACT inhaler Inhale 2 puffs into the lungs every 6 (six) hours as needed for wheezing or shortness of breath. 1 Inhaler 2  . ALPRAZolam (XANAX) 0.25 MG tablet Take 0.25 mg by mouth 3 (three) times daily as needed for anxiety.    . baclofen (LIORESAL) 10 MG tablet Take 1 tablet (10 mg total) by mouth 3 (three)  times daily. 90 each 3  . carbidopa-levodopa (SINEMET IR) 25-100 MG tablet Take 1 tablet by mouth 3 (three) times daily.    . clopidogrel (PLAVIX) 75 MG tablet Take 75 mg by mouth daily. Reported on 10/03/2015    . divalproex (DEPAKOTE) 500 MG DR tablet Take 500 mg by mouth 2 (two) times daily.     Marland Kitchen docusate sodium (COLACE) 100 MG capsule Take 100 mg by mouth daily.    . DULoxetine (CYMBALTA) 30 MG capsule Take 60 mg by mouth daily.     . hydrALAZINE (APRESOLINE) 25 MG tablet Take 25 mg by mouth 2 (two) times daily.    Marland Kitchen lisinopril (PRINIVIL,ZESTRIL) 40 MG tablet Take 40 mg by mouth daily.    . Oxcarbazepine (TRILEPTAL) 300 MG tablet Take 300 mg by mouth 2 (two) times daily.    . Oxycodone HCl 10 MG TABS Take 10 mg by mouth every 6 (six) hours as needed. Takes 1 1/2 tab as needed    . sildenafil (VIAGRA) 50 MG tablet Take 25 mg by mouth daily as needed for erectile dysfunction.     . Tamsulosin HCl (FLOMAX) 0.4 MG CAPS Take 0.4 mg by mouth daily.     Marland Kitchen tiotropium (SPIRIVA HANDIHALER) 18 MCG inhalation capsule Place 1 capsule (18 mcg total) into inhaler and inhale daily. 30 capsule 6  . UNABLE TO FIND 5 mg daily. Thyroid     No current facility-administered medications for this visit.      Past Surgical History:  Procedure Laterality Date  . EP IMPLANTABLE DEVICE N/A 11/15/2014   Procedure: Loop Recorder Insertion;  Surgeon: Thompson Grayer, MD;  Location: [place] [facility];  Service: Cardiovascular;  Laterality: N/A;  . ESOPHAGOGASTRODUODENOSCOPY (EGD) WITH PROPOFOL N/A 03/20/2016   Procedure: ESOPHAGOGASTRODUODENOSCOPY (EGD) WITH PROPOFOL;  Surgeon: Daneil Dolin, MD;  [facility];  Service: Endoscopy;  Laterality: N/A;  . HAND SURGERY     right  . NECK SURGERY    . right hand reconstruction       Allergies  Allergen Reactions  . Keppra [Levetiracetam] Other (See Comments)    Suicidal ideation, anaphylaxsis  . Adhesive [Tape]     Severe rash  . Methadone     Delerium    . Morphine And Related Nausea And Vomiting    Can only take through IV.      Family History  Problem Relation Age of Onset  . Cervical cancer Mother   . Heart failure Father   . Lung cancer Sister   . Breast cancer Sister   . Colon cancer Neg Hx   . Colon polyps Neg Hx      Social History Mr. Croft reports that he has been smoking Cigarettes.  He started smoking about 54 years ago. He has a 4.50 pack-year smoking history. He has never used smokeless tobacco. Mr. Parlato reports that he does not  drink alcohol.   Review of Systems CONSTITUTIONAL: No weight loss, fever, chills, weakness or fatigue.  HEENT: Eyes: No visual loss, blurred vision, double vision or yellow sclerae.No hearing loss, sneezing, congestion, runny nose or sore throat.  SKIN: No rash or itching.  CARDIOVASCULAR:per hpi  RESPIRATORY: per hpi GASTROINTESTINAL: No anorexia, nausea, vomiting or diarrhea. No abdominal pain or blood.  GENITOURINARY: No burning on urination, no polyuria NEUROLOGICAL: No headache, dizziness, syncope, paralysis, ataxia, numbness or tingling in the extremities. No change in bowel or bladder control.  MUSCULOSKELETAL: No muscle, back pain, joint pain or stiffness.  LYMPHATICS: No enlarged nodes. No history of splenectomy.  PSYCHIATRIC: No history of depression or anxiety.  ENDOCRINOLOGIC: No reports of sweating, cold or heat intolerance. No polyuria or polydipsia.  Marland Kitchen   Physical Examination Vitals:   11/14/16 1409  BP: 121/79  Pulse: 92   Vitals:   11/14/16 1409  Weight: 128 lb (58.1 kg)  Height: 5\' 8"  (1.727 m)    Gen: resting comfortably, no acute distress HEENT: no scleral icterus, pupils equal round and reactive, no palptable cervical adenopathy,  CV: RRR, no m/r/g, no jvd Resp: Clear to auscultation bilaterally GI: abdomen is soft, non-tender, non-distended, normal bowel sounds, no hepatosplenomegaly MSK: extremities are warm, no edema.  Skin: warm, no rash Neuro:   no focal deficits Psych: appropriate affect   Diagnostic Studies 10/2014 echo Study Conclusions  - Left ventricle: The cavity size was mildly reduced. Wall thickness was increased in a pattern of moderate LVH. Systolic function was vigorous. The estimated ejection fraction was in the range of 65% to 70%. There is mitral chordal SAM. Resting LVOT gradient 27 mmHg with increase to 77 mmHg with Valsalva consistent with hypertrophic obstructive cardiomyopathy. Wall motion was normal; there were no regional wall motion abnormalities. Doppler parameters are consistent with abnormal left ventricular relaxation (grade 1 diastolic dysfunction). - Aortic valve: Mildly calcified annulus. Trileaflet; mildly calcified leaflets. Peak gradient (S): 27 mm Hg at rest reflecting LVOT flow. - Aortic root: The aortic root was mildly ectatic. - Mitral valve: Calcified annulus. There was trivial regurgitation. - Left atrium: The atrium was at the upper limits of normal in size. - Right atrium: Central venous pressure (est): 3 mm Hg. - Atrial septum: No defect or patent foramen ovale was identified. - Tricuspid valve: There was trivial regurgitation. - Pulmonary arteries: Systolic pressure could not be accurately estimated. - Pericardium, extracardiac: There was no pericardial effusion.      Assessment and Plan   1. Hx of CVA - loop recorder in place, has not had any evidence of arrhythmia as cause. Continue to monitor  2. Left ventricular hypertrophy - dynamic gradient noted on echo due to symmetric LVH and hyperdynamic LV - off beta blocker due to recent pauses noted on loop recorder and presyncope - increasing SOB/DOE, unclear if related. We will repeat echo.    3. Presyncope/Bradycardia - episode corresponded with sinus pause on loop recorder, plan discussed with EP Dr Rayann Heman and is to follow symptoms and loop reports while off propanolol. In early discussions  about a pacemaker patient is fairly against it at this time.  - no recurrent bradycardia noted on loop recorder checks, no recurrent symptoms  - we will continue to monitor  4. SOB - repeat echo    F/u 1 month     Arnoldo Lenis, M.D.

## 2016-11-14 NOTE — Patient Instructions (Signed)
Medication Instructions:  Continue all current medications.  Labwork: none  Testing/Procedures:  Your physician has requested that you have an echocardiogram. Echocardiography is a painless test that uses sound waves to create images of your heart. It provides your doctor with information about the size and shape of your heart and how well your heart's chambers and valves are working. This procedure takes approximately one hour. There are no restrictions for this procedure.  Office will contact with results via phone or letter.    Follow-Up: 1 month   Any Other Special Instructions Will Be Listed Below (If Applicable).  If you need a refill on your cardiac medications before your next appointment, please call your pharmacy.  

## 2016-11-14 NOTE — Telephone Encounter (Signed)
Scheduled for Echo on Nov 19, 2016 in [place]

## 2016-11-19 ENCOUNTER — Ambulatory Visit (INDEPENDENT_AMBULATORY_CARE_PROVIDER_SITE_OTHER): Payer: Medicare Other

## 2016-11-19 ENCOUNTER — Other Ambulatory Visit: Payer: Self-pay

## 2016-11-19 DIAGNOSIS — R0602 Shortness of breath: Secondary | ICD-10-CM

## 2016-11-20 ENCOUNTER — Telehealth: Payer: Self-pay | Admitting: Cardiology

## 2016-11-20 ENCOUNTER — Encounter: Payer: Self-pay | Admitting: *Deleted

## 2016-11-20 ENCOUNTER — Encounter: Payer: Self-pay | Admitting: Gastroenterology

## 2016-11-20 ENCOUNTER — Ambulatory Visit (INDEPENDENT_AMBULATORY_CARE_PROVIDER_SITE_OTHER): Payer: Medicare Other | Admitting: Gastroenterology

## 2016-11-20 ENCOUNTER — Telehealth: Payer: Self-pay | Admitting: *Deleted

## 2016-11-20 VITALS — BP 128/87 | HR 102 | Temp 97.0°F | Ht 68.0 in | Wt 124.0 lb

## 2016-11-20 DIAGNOSIS — R630 Anorexia: Secondary | ICD-10-CM | POA: Insufficient documentation

## 2016-11-20 DIAGNOSIS — R634 Abnormal weight loss: Secondary | ICD-10-CM

## 2016-11-20 DIAGNOSIS — K7400 Hepatic fibrosis, unspecified: Secondary | ICD-10-CM

## 2016-11-20 DIAGNOSIS — R0602 Shortness of breath: Secondary | ICD-10-CM

## 2016-11-20 NOTE — Progress Notes (Signed)
Primary Care Physician: Tawni Carnes, PA-C  Primary Gastroenterologist:  Garfield Cornea, MD   Chief Complaint  Patient presents with  . Weight Loss    f/u  . Anorexia    some days    HPI: Eric Tran is a 66 y.o. male here for six-month follow-up. Last seen in November 2017. He has a history of cirrhosis originally diagnosed by a [place]. His hepatitis C was adequately treated, he's had negative HCV RNA for over one year therefore needed no further testing. CT without indication of cirrhosis. Previous ultrasound in April 2017 with mild hepatomegaly, increased echogenicity throughout the liver. Most recent abdominal ultrasound on 09/16/2016 with mildly increased echo texture of the liver, normal spleen, mildly prominent abdominal aorta measuring 3.3 cm, recommended ultrasound in 3 years for that we advise for patient to have PCP follow this although he'll be getting periodic ultrasounds for hepatoma screening because of prior history of hep C and at least stage III fibrosis per [place] records. No evidence of esophageal varices on EGD September 2017 but he did have portal hypertensive gastropathy.  Since his last office visit he did have H. pylori stool antigen which was negative. Labs in March and an outside source with unremarkable LFTs, INR, platelet count. He completed a cologuard test (because of poor prep twice and no desire to attempt prep again) which was negative. Plans to repeat in November 2020.  Patient presents with his wife today. She states he's had ongoing lack of motivation, energy. Some days his appetite is poor but other days it seems to be okay. Chronically feels fatigued. Has been having some shortness of breath with exertion, stress test is scheduled. Follows with Dr. Harl Bowie. They've been having issues getting his pain under control. Tried methadone which cause delirium. They tried water therapy. He denies heartburn. Sometimes he "gasps" when he swallowing like he  might be choking. He is edentulous. He denies vomiting, abdominal pain. His bowel movements are regular. Takes a stool softener. No longer requires Amitiza. Denies melena or rectal bleeding. Worried about ongoing weight loss. Down 7 pounds since November, 5 pounds since April.  Wt Readings from Last 3 Encounters:  11/20/16 124 lb (56.2 kg)  11/14/16 128 lb (58.1 kg)  10/02/16 129 lb (58.5 kg)  04/2016  131 lb   Current Outpatient Prescriptions  Medication Sig Dispense Refill  . albuterol (PROVENTIL HFA;VENTOLIN HFA) 108 (90 Base) MCG/ACT inhaler Inhale 2 puffs into the lungs every 6 (six) hours as needed for wheezing or shortness of breath. 1 Inhaler 2  . ALPRAZolam (XANAX) 0.25 MG tablet Take 0.25 mg by mouth 3 (three) times daily as needed for anxiety.    . clopidogrel (PLAVIX) 75 MG tablet Take 75 mg by mouth daily. Reported on 10/03/2015    . divalproex (DEPAKOTE) 500 MG DR tablet Take 500 mg by mouth 2 (two) times daily.     Marland Kitchen docusate sodium (COLACE) 100 MG capsule Take 100 mg by mouth 2 (two) times daily.     . DULoxetine (CYMBALTA) 30 MG capsule Take 60 mg by mouth daily.     . hydrALAZINE (APRESOLINE) 25 MG tablet Take 25 mg by mouth 2 (two) times daily.    Marland Kitchen lisinopril (PRINIVIL,ZESTRIL) 40 MG tablet Take 40 mg by mouth daily.    . Oxcarbazepine (TRILEPTAL) 300 MG tablet Take 300 mg by mouth 2 (two) times daily.    Marland Kitchen oxyCODONE (ROXICODONE) 15 MG immediate release tablet Take 1  tablet by mouth 3 (three) times daily.    . sildenafil (VIAGRA) 50 MG tablet Take 25 mg by mouth daily as needed for erectile dysfunction.     . Tamsulosin HCl (FLOMAX) 0.4 MG CAPS Take 0.4 mg by mouth daily.     Marland Kitchen tiotropium (SPIRIVA HANDIHALER) 18 MCG inhalation capsule Place 1 capsule (18 mcg total) into inhaler and inhale daily. 30 capsule 6  . traZODone (DESYREL) 50 MG tablet Take 50 mg by mouth at bedtime.    Marland Kitchen UNABLE TO FIND 5 mg daily. Thyroid    . XTAMPZA ER 18 MG C12A Take 1 tablet by mouth 2 (two)  times daily.     No current facility-administered medications for this visit.     Allergies as of 11/20/2016 - Review Complete 11/20/2016  Allergen Reaction Noted  . Keppra [levetiracetam] Other (See Comments) 10/03/2014  . Adhesive [tape]  11/15/2014  . Methadone  10/02/2016  . Morphine and related Nausea And Vomiting 05/01/2011   Past Medical History:  Diagnosis Date  . Abdominal aortic aneurysm ([place])   . Abnormality of gait 10/03/2014  . Anxiety   . Arthritis   . BPH (benign prostatic hyperplasia)   . Carpal tunnel syndrome   . Cerebrovascular disease 10/03/2014  . Chronic back pain    chronic narcotic use  . Chronic hepatitis C ([place]) 1980   Harvoni 2016, cured. HCV RNA negative 01/2016  . Chronic pain   . Degenerative disc disease   . Depression   . Head trauma   . Hepatic cirrhosis ([place]) 02/19/2016  . Hypertension   . Hyperthyroidism   . Memory difficulties 10/03/2014  . Partial complex seizure disorder without intractable epilepsy ([place]) 10/03/2014  . Seizures ([place])    Epilepsy  . Stroke [facility]) 2012   left hemiplegia  . TIA (transient ischemic attack)   . Tremor   . Tremor, essential 10/02/2016  . UTI (lower urinary tract infection)    Past Surgical History:  Procedure Laterality Date  . EP IMPLANTABLE DEVICE N/A 11/15/2014   Procedure: Loop Recorder Insertion;  Surgeon: Thompson Grayer, MD;  Location: [facility];  Service: Cardiovascular;  Laterality: N/A;  . ESOPHAGOGASTRODUODENOSCOPY (EGD) WITH PROPOFOL N/A 03/20/2016   Procedure: ESOPHAGOGASTRODUODENOSCOPY (EGD) WITH PROPOFOL;  Surgeon: Daneil Dolin, MD;  [facility];  Service: Endoscopy;  Laterality: N/A;  . HAND SURGERY     right  . NECK SURGERY    . right hand reconstruction      ROS:  General: Negative for   fever, chills, fatigue. See hpi. ENT: Negative for hoarseness, difficulty swallowing , nasal congestion. CV: Negative for chest pain, angina, palpitations,   peripheral edema. See  hpi Respiratory: Negative for dyspnea at rest, dyspnea on exertion, cough, sputum, wheezing.  GI: See history of present illness. GU:  Negative for dysuria, hematuria, urinary incontinence, urinary frequency, nocturnal urination.  Endo: see hpi   Physical Examination:   BP 128/87   Pulse (!) 102   Temp 97 F (36.1 C) (Oral)   Ht 5\' 8"  (1.727 m)   Wt 124 lb (56.2 kg)   BMI 18.85 kg/m   General: appears older than stated age. Nad. Accompanied by wife.   Eyes: No icterus. Mouth: Oropharyngeal mucosa moist and pink , no lesions erythema or exudate. Lungs: Clear to auscultation bilaterally.  Heart: Regular rate and rhythm, no murmurs rubs or gallops.  Abdomen: Bowel sounds are normal, nontender, nondistended, no hepatosplenomegaly or masses, no abdominal bruits  or hernia , no rebound or guarding.   Extremities: No lower extremity edema. No clubbing or deformities. Neuro: Alert and oriented x 4   Skin: Warm and dry, no jaundice.   Psych: Alert and cooperative, normal mood and affect.  Labs:  Labs dated 09/03/2016 White blood cell count 6600, hemoglobin 14.1, hematocrit 41.5, platelets 192,000, glucose 73, BUN 10, creatinine 0.67, total bilirubin 0.4, alkaline phosphatase 44, AST 19, ALT 12, albumin 4, TSH 1.080, INR 1.  Imaging Studies: No results found.

## 2016-11-20 NOTE — Telephone Encounter (Signed)
Pt agreeable to stress test - will place orders and route to schedulers - routed to pcp

## 2016-11-20 NOTE — Patient Instructions (Signed)
1. I will need to contact your insurance to find out if appetite stimulant is covered. We will be in touch in next day or so.  2. Encourage oral intake, try nutritional supplements if needed like carnation instant breakfast, boost, ensure.  3. You will be due labs and ultrasound in 02/2017.  4. FOR WEIGHT LOSS AND SHORTNESS OF BREATH, PLEASE TALK WITH YOUR FAMILY DOCTOR AND SEE IF THEY RECOMMEND XRAY OR CAT SCAN OF THE LUNGS.

## 2016-11-20 NOTE — Telephone Encounter (Signed)
-----   Message from Arnoldo Lenis, MD sent at 11/20/2016  9:19 AM EDT ----- Echo overall looks good. Nothing to explain his SOB. I would like to get a stress test to look for a possible blockage as the cause of his symptoms. Please order a lexiscan for SOB, does not need to hold any meds  [name] MD

## 2016-11-20 NOTE — Telephone Encounter (Signed)
Pre-cert Verification for the following procedure   Lexiscan myoview scheduled for 12/08/16 at [facility]

## 2016-11-23 NOTE — Assessment & Plan Note (Signed)
22 are old gentleman with history of hepatic fibrosis, chronic hepatitis C status post eradication, ongoing unintentional weight loss and anorexia. EGD as outlined on the last year. Unable to complete colonoscopy twice due to poor bowel prep. Completed Cologuard testing which was negative. CT abdomen and pelvis done September 2017, nothing explain weight loss or anorexia. Patient has had some dyspnea on exertion recently, currently being evaluated by cardiology with stress test ordered.  Unclear etiology for poor appetite. Question possibility of depression related versus other such as underlying malignancy not yet detected. Given recent shortness of breath, poor appetite and weight loss I have asked patient's wife to discuss with PCP about possibility of chest CT. Currently up-to-date on hepatoma screening and labs. He will not be due to September for further ultrasound and labs.  Consider possibility appetite stimulant versus empiric trial of Marinol for possible underlying gastroparesis in the setting of polypharmacy and chronic narcotics. To discuss with Dr. Gala Romney. Further recommendations to follow.

## 2016-11-25 ENCOUNTER — Other Ambulatory Visit: Payer: Self-pay

## 2016-11-25 DIAGNOSIS — R627 Adult failure to thrive: Secondary | ICD-10-CM

## 2016-11-25 DIAGNOSIS — R634 Abnormal weight loss: Secondary | ICD-10-CM

## 2016-11-25 NOTE — Progress Notes (Signed)
Please let patient's wife noted that I discussed the case with Dr. Gala Romney.  Dr. Gala Romney recommends further evaluation for his lack of appetite and unintentional weight loss prior to consideration of appetite stimulants.  Specifically's recommending proceeding with CT of the chest with contrast for unintentional weight loss/failure to thrive in an adult.   Please schedule if patient willing.    FYI for records Dr. Gala Romney also mentioned possibility of pursuing colonoscopy at another facility because patient has failed prep twice but I had already addressed this with the patient and he was not interested in pursuing colonoscopy and thus the reason we had done Cologuard testing.

## 2016-11-25 NOTE — Progress Notes (Signed)
Attempted to submit PA for CT chest via [facility] website. No PA needed.

## 2016-11-25 NOTE — Progress Notes (Signed)
Called and informed pt's wife. She is ok with having CT chest with contrast done. She gave dates that pt will be unable to go.  CT Chest with contrast scheduled for 12/09/16 at 11:00am, pt to arrive at 10:45am. Clear liquids only 4 hours prior to test. Called pt's wife back and informed her of appt. Letter also mailed.

## 2016-12-05 ENCOUNTER — Ambulatory Visit (INDEPENDENT_AMBULATORY_CARE_PROVIDER_SITE_OTHER): Payer: Medicare Other | Admitting: *Deleted

## 2016-12-05 DIAGNOSIS — R001 Bradycardia, unspecified: Secondary | ICD-10-CM | POA: Diagnosis not present

## 2016-12-05 NOTE — Progress Notes (Signed)
Carelink Summary Report / Loop Recorder 

## 2016-12-08 ENCOUNTER — Encounter (HOSPITAL_BASED_OUTPATIENT_CLINIC_OR_DEPARTMENT_OTHER)
Admission: RE | Admit: 2016-12-08 | Discharge: 2016-12-08 | Disposition: A | Discharge status: Home / Self Care | Payer: Medicare Other | Source: Ambulatory Visit | Attending: Cardiology | Admitting: Cardiology

## 2016-12-08 ENCOUNTER — Encounter (HOSPITAL_COMMUNITY)
Admission: RE | Admit: 2016-12-08 | Discharge: 2016-12-08 | Disposition: A | Discharge status: Home / Self Care | Payer: Medicare Other | Source: Ambulatory Visit | Attending: Cardiology | Admitting: Cardiology

## 2016-12-08 ENCOUNTER — Encounter (HOSPITAL_COMMUNITY): Payer: Self-pay

## 2016-12-08 DIAGNOSIS — R0602 Shortness of breath: Secondary | ICD-10-CM | POA: Insufficient documentation

## 2016-12-08 LAB — NM MYOCAR MULTI W/SPECT W/WALL MOTION / EF
LV dias vol: 70 mL (ref 62–150)
LV sys vol: 32 mL
Peak HR: 109 {beats}/min
RATE: 0.29
Rest HR: 65 {beats}/min
SDS: 1
SRS: 4
SSS: 5
TID: 1.32

## 2016-12-08 MED ORDER — TECHNETIUM TC 99M TETROFOSMIN IV KIT
10.0000 | PACK | Freq: Once | INTRAVENOUS | Status: AC | PRN
  Administered 2016-12-08: 10 via INTRAVENOUS

## 2016-12-08 MED ORDER — REGADENOSON 0.4 MG/5ML IV SOLN
INTRAVENOUS | Status: AC
  Administered 2016-12-08: 0.4 mg via INTRAVENOUS
  Filled 2016-12-08: qty 5

## 2016-12-08 MED ORDER — SODIUM CHLORIDE 0.9% FLUSH
INTRAVENOUS | Status: AC
  Administered 2016-12-08: 10 mL via INTRAVENOUS
  Filled 2016-12-08: qty 10

## 2016-12-08 MED ORDER — TECHNETIUM TC 99M TETROFOSMIN IV KIT
30.0000 | PACK | Freq: Once | INTRAVENOUS | Status: AC | PRN
  Administered 2016-12-08: 30 via INTRAVENOUS

## 2016-12-09 ENCOUNTER — Ambulatory Visit (HOSPITAL_COMMUNITY)
Admission: RE | Admit: 2016-12-09 | Discharge: 2016-12-09 | Disposition: A | Discharge status: Home / Self Care | Payer: Medicare Other | Source: Ambulatory Visit | Attending: Gastroenterology | Admitting: Gastroenterology

## 2016-12-09 ENCOUNTER — Telehealth: Payer: Self-pay | Admitting: *Deleted

## 2016-12-09 DIAGNOSIS — R634 Abnormal weight loss: Secondary | ICD-10-CM | POA: Diagnosis present

## 2016-12-09 DIAGNOSIS — I712 Thoracic aortic aneurysm, without rupture: Secondary | ICD-10-CM | POA: Diagnosis not present

## 2016-12-09 DIAGNOSIS — I700 Atherosclerosis of aorta: Secondary | ICD-10-CM | POA: Insufficient documentation

## 2016-12-09 DIAGNOSIS — J439 Emphysema, unspecified: Secondary | ICD-10-CM | POA: Diagnosis not present

## 2016-12-09 DIAGNOSIS — R627 Adult failure to thrive: Secondary | ICD-10-CM | POA: Diagnosis not present

## 2016-12-09 LAB — POCT I-STAT CREATININE: Creatinine, Ser: 0.7 mg/dL (ref 0.61–1.24)

## 2016-12-09 MED ORDER — IOPAMIDOL (ISOVUE-300) INJECTION 61%
75.0000 mL | Freq: Once | INTRAVENOUS | Status: AC | PRN
  Administered 2016-12-09: 75 mL via INTRAVENOUS

## 2016-12-09 NOTE — Telephone Encounter (Signed)
-----   Message from Arnoldo Lenis, MD sent at 12/09/2016  1:18 PM EDT ----- Stress test looks good, no evidence of blockages. We will discuss at our f/u  J BrancH MD

## 2016-12-09 NOTE — Telephone Encounter (Signed)
Pt aware - routed to pcp  

## 2016-12-15 ENCOUNTER — Encounter: Payer: Self-pay | Admitting: Cardiology

## 2016-12-15 ENCOUNTER — Ambulatory Visit (INDEPENDENT_AMBULATORY_CARE_PROVIDER_SITE_OTHER): Payer: Medicare Other | Admitting: Cardiology

## 2016-12-15 VITALS — BP 137/89 | HR 102 | Ht 68.0 in | Wt 122.0 lb

## 2016-12-15 DIAGNOSIS — J449 Chronic obstructive pulmonary disease, unspecified: Secondary | ICD-10-CM | POA: Diagnosis not present

## 2016-12-15 DIAGNOSIS — R0602 Shortness of breath: Secondary | ICD-10-CM | POA: Diagnosis not present

## 2016-12-15 NOTE — Patient Instructions (Signed)
Medication Instructions:  Continue all current medications.  Labwork: none  Testing/Procedures: none  Follow-Up: Your physician wants you to follow up in: 6 months.  You will receive a reminder letter in the mail one-two months in advance.  If you don't receive a letter, please call our office to schedule the follow up appointment   Any Other Special Instructions Will Be Listed Below (If Applicable). You have been referred to:  Dr. Sinda Du - Pulmonology.   If you need a refill on your cardiac medications before your next appointment, please call your pharmacy.

## 2016-12-15 NOTE — Progress Notes (Signed)
Clinical Summary Eric Tran is a 65 y.o.male seen today for follow up of the following medical problems. This is a focused visit on his recent symptoms of SOB, for more detailed history please refer to prior clinic notes.    1. SOB - 11/2016 stress test without ischemia - echo 10/2016 LVEF 65-70%, no WMAs, mild gradient, no provocative maneuvers performed. Previous gradient of 43mmHg with Valsalva - previous PFTs. Mild obstruction, air trapping, high airway resistance. Moderate emphysema on CT  - symptoms overall unchanged since last visit.    2. AAA - 3.3 cm noted on previous imagaing.   3. Thoracic aneurysm - CT 11/2016 3.9 cm ascedning aneurysm stable, 3.9 cm descending aneurysm.     Past Medical History:  Diagnosis Date  . Abdominal aortic aneurysm ([place])   . Abnormality of gait 10/03/2014  . Anxiety   . Arthritis   . BPH (benign prostatic hyperplasia)   . Carpal tunnel syndrome   . Cerebrovascular disease 10/03/2014  . Chronic back pain    chronic narcotic use  . Chronic hepatitis C ([place]) 1980   Harvoni 2016, cured. HCV RNA negative 01/2016  . Chronic pain   . Degenerative disc disease   . Depression   . Head trauma   . Hepatic cirrhosis ([place]) 02/19/2016  . Hypertension   . Hyperthyroidism   . Memory difficulties 10/03/2014  . Partial complex seizure disorder without intractable epilepsy ([place]) 10/03/2014  . Seizures ([place])    Epilepsy  . Stroke [facility]) 2012   left hemiplegia  . TIA (transient ischemic attack)   . Tremor   . Tremor, essential 10/02/2016  . UTI (lower urinary tract infection)      Allergies  Allergen Reactions  . Keppra [Levetiracetam] Other (See Comments)    Suicidal ideation, anaphylaxsis  . Adhesive [Tape]     Severe rash  . Methadone     Delerium  . Morphine And Related Nausea And Vomiting    Can only take through IV.     Current Outpatient Prescriptions  Medication Sig Dispense Refill  . albuterol (PROVENTIL HFA;VENTOLIN HFA) 108 (90  Base) MCG/ACT inhaler Inhale 2 puffs into the lungs every 6 (six) hours as needed for wheezing or shortness of breath. 1 Inhaler 2  . ALPRAZolam (XANAX) 0.25 MG tablet Take 0.25 mg by mouth 3 (three) times daily as needed for anxiety.    . clopidogrel (PLAVIX) 75 MG tablet Take 75 mg by mouth daily. Reported on 10/03/2015    . divalproex (DEPAKOTE) 500 MG DR tablet Take 500 mg by mouth 2 (two) times daily.     Marland Kitchen docusate sodium (COLACE) 100 MG capsule Take 100 mg by mouth 2 (two) times daily.     . DULoxetine (CYMBALTA) 30 MG capsule Take 60 mg by mouth daily.     . hydrALAZINE (APRESOLINE) 25 MG tablet Take 25 mg by mouth 2 (two) times daily.    Marland Kitchen lisinopril (PRINIVIL,ZESTRIL) 40 MG tablet Take 40 mg by mouth daily.    . Oxcarbazepine (TRILEPTAL) 300 MG tablet Take 300 mg by mouth 2 (two) times daily.    Marland Kitchen oxyCODONE (ROXICODONE) 15 MG immediate release tablet Take 1 tablet by mouth 3 (three) times daily.    . sildenafil (VIAGRA) 50 MG tablet Take 25 mg by mouth daily as needed for erectile dysfunction.     . Tamsulosin HCl (FLOMAX) 0.4 MG CAPS Take 0.4 mg by mouth daily.     Marland Kitchen tiotropium (SPIRIVA HANDIHALER)  18 MCG inhalation capsule Place 1 capsule (18 mcg total) into inhaler and inhale daily. 30 capsule 6  . traZODone (DESYREL) 50 MG tablet Take 50 mg by mouth at bedtime.    Marland Kitchen UNABLE TO FIND 5 mg daily. Thyroid    . XTAMPZA ER 18 MG C12A Take 1 tablet by mouth 2 (two) times daily.     No current facility-administered medications for this visit.      Past Surgical History:  Procedure Laterality Date  . EP IMPLANTABLE DEVICE N/A 11/15/2014   Procedure: Loop Recorder Insertion;  Surgeon: Thompson Grayer, MD;  Location: [facility];  Service: Cardiovascular;  Laterality: N/A;  . ESOPHAGOGASTRODUODENOSCOPY (EGD) WITH PROPOFOL N/A 03/20/2016   Procedure: ESOPHAGOGASTRODUODENOSCOPY (EGD) WITH PROPOFOL;  Surgeon: Daneil Dolin, MD;  [facility];  Service: Endoscopy;  Laterality:  N/A;  . HAND SURGERY     right  . NECK SURGERY    . right hand reconstruction       Allergies  Allergen Reactions  . Keppra [Levetiracetam] Other (See Comments)    Suicidal ideation, anaphylaxsis  . Adhesive [Tape]     Severe rash  . Methadone     Delerium  . Morphine And Related Nausea And Vomiting    Can only take through IV.      Family History  Problem Relation Age of Onset  . Cervical cancer Mother   . Heart failure Father   . Lung cancer Sister   . Breast cancer Sister   . Colon cancer Neg Hx   . Colon polyps Neg Hx      Social History Eric Tran reports that he has been smoking Cigarettes.  He started smoking about 54 years ago. He has a 4.50 pack-year smoking history. He has never used smokeless tobacco. Eric Tran reports that he does not drink alcohol.   Review of Systems CONSTITUTIONAL: No weight loss, fever, chills, weakness or fatigue.  HEENT: Eyes: No visual loss, blurred vision, double vision or yellow sclerae.No hearing loss, sneezing, congestion, runny nose or sore throat.  SKIN: No rash or itching.  CARDIOVASCULAR: no chest pain.  RESPIRATORY: +SOB GASTROINTESTINAL: No anorexia, nausea, vomiting or diarrhea. No abdominal pain or blood.  GENITOURINARY: No burning on urination, no polyuria NEUROLOGICAL: No headache, dizziness, syncope, paralysis, ataxia, numbness or tingling in the extremities. No change in bowel or bladder control.  MUSCULOSKELETAL: No muscle, back pain, joint pain or stiffness.  LYMPHATICS: No enlarged nodes. No history of splenectomy.  PSYCHIATRIC: No history of depression or anxiety.  ENDOCRINOLOGIC: No reports of sweating, cold or heat intolerance. No polyuria or polydipsia.  Marland Kitchen   Physical Examination Vitals:   12/15/16 1607  BP: 137/89  Pulse: (!) 102   Vitals:   12/15/16 1607  Weight: 122 lb (55.3 kg)  Height: 5\' 8"  (1.727 m)    Gen: resting comfortably, no acute distress HEENT: no scleral icterus, pupils equal  round and reactive, no palptable cervical adenopathy,  CV: RRR, no m/r/g, no jvd Resp: Clear to auscultation bilaterally GI: abdomen is soft, non-tender, non-distended, normal bowel sounds, no hepatosplenomegaly MSK: extremities are warm, no edema.  Skin: warm, no rash Neuro:  no focal deficits Psych: appropriate affect   Diagnostic Studies 10/2014 echo Study Conclusions  - Left ventricle: The cavity size was mildly reduced. Wall thickness was increased in a pattern of moderate LVH. Systolic function was vigorous. The estimated ejection fraction was in the range of 65% to 70%. There is mitral  chordal SAM. Resting LVOT gradient 27 mmHg with increase to 77 mmHg with Valsalva consistent with hypertrophic obstructive cardiomyopathy. Wall motion was normal; there were no regional wall motion abnormalities. Doppler parameters are consistent with abnormal left ventricular relaxation (grade 1 diastolic dysfunction). - Aortic valve: Mildly calcified annulus. Trileaflet; mildly calcified leaflets. Peak gradient (S): 27 mm Hg at rest reflecting LVOT flow. - Aortic root: The aortic root was mildly ectatic. - Mitral valve: Calcified annulus. There was trivial regurgitation. - Left atrium: The atrium was at the upper limits of normal in size. - Right atrium: Central venous pressure (est): 3 mm Hg. - Atrial septum: No defect or patent foramen ovale was identified. - Tricuspid valve: There was trivial regurgitation. - Pulmonary arteries: Systolic pressure could not be accurately estimated. - Pericardium, extracardiac: There was no pericardial effusion.  2017 PFTs Mild obstruction, air trapping, high airway resistance.  11/2016 nuclear stress test  There was no ST segment deviation noted during stress.  The study is normal. No ischemia or scar.  This is a low risk study.  Nuclear stress EF: 54%.   Assessment and Plan  1. SOB - fairly benign cardiac workup.  Stress test without ischemia. Echo with normal LVEF. He does have diastolic dysfunction as well as an intracavitary dynamic gradient. Treatment for this is limited due to severe bradycardia on beta blockers in the past - we will refer to pulmonary to see if further management of his COPD may help his symptoms.  - EKG in clinic without acute ischemic changes.     Arnoldo Lenis, M.D.

## 2016-12-16 LAB — CUP PACEART REMOTE DEVICE CHECK
Date Time Interrogation Session: 20180608053810
Implantable Pulse Generator Implant Date: 20160518

## 2016-12-17 ENCOUNTER — Telehealth: Payer: Self-pay

## 2016-12-17 DIAGNOSIS — K7400 Hepatic fibrosis, unspecified: Secondary | ICD-10-CM

## 2016-12-17 NOTE — Telephone Encounter (Signed)
Lab order on file. 

## 2016-12-17 NOTE — Telephone Encounter (Signed)
-----   Message from Mahala Menghini, PA-C sent at 11/23/2016  6:56 PM EDT ----- We need to have patient complete labs in 02/2017 for liver fibrosis.   CBC, CMET, PT/INR.

## 2016-12-18 NOTE — Progress Notes (Signed)
Diffuse atherosclerosis of aorta, great vessels, coronary arteries. Mild dilation of ascending aorta 3.9cm. Eccentric aneurysmal dilation of descending thoracic aorta, 3.9cm. His cardiologist is aware but no mention of further management. Please find out if patient's cardiologist would be willing to take over surveillance of his aneurysms.   Emphysema noted, can contribute to weight loss. Noted he has pulmonary consultation pending.   Given degree of atherosclerosis cannot rule out similar changes in the mesenteric arteries contributing to weight loss. It would be worth checking CTA abdomen/pelvis for weight loss (greater than 10% of body weight).  Patient needs routine f/u with rmr only.

## 2016-12-25 DIAGNOSIS — J449 Chronic obstructive pulmonary disease, unspecified: Secondary | ICD-10-CM | POA: Diagnosis not present

## 2016-12-25 DIAGNOSIS — I10 Essential (primary) hypertension: Secondary | ICD-10-CM | POA: Diagnosis not present

## 2016-12-25 DIAGNOSIS — T148XXA Other injury of unspecified body region, initial encounter: Secondary | ICD-10-CM | POA: Diagnosis not present

## 2016-12-29 ENCOUNTER — Other Ambulatory Visit: Payer: Self-pay

## 2016-12-29 ENCOUNTER — Encounter: Payer: Self-pay | Admitting: Internal Medicine

## 2016-12-29 DIAGNOSIS — R634 Abnormal weight loss: Secondary | ICD-10-CM

## 2016-12-29 NOTE — Progress Notes (Signed)
APPT MADE AND LETTER SENT  °

## 2017-01-05 ENCOUNTER — Encounter: Payer: Self-pay | Admitting: Internal Medicine

## 2017-01-05 ENCOUNTER — Ambulatory Visit (INDEPENDENT_AMBULATORY_CARE_PROVIDER_SITE_OTHER): Payer: Medicare Other | Admitting: *Deleted

## 2017-01-05 DIAGNOSIS — I6310 Cerebral infarction due to embolism of unspecified precerebral artery: Secondary | ICD-10-CM | POA: Diagnosis not present

## 2017-01-06 NOTE — Progress Notes (Signed)
Carelink Summary Report / Loop Recorder 

## 2017-01-07 DIAGNOSIS — M961 Postlaminectomy syndrome, not elsewhere classified: Secondary | ICD-10-CM | POA: Diagnosis not present

## 2017-01-07 DIAGNOSIS — M545 Low back pain: Secondary | ICD-10-CM | POA: Diagnosis not present

## 2017-01-07 DIAGNOSIS — G894 Chronic pain syndrome: Secondary | ICD-10-CM | POA: Diagnosis not present

## 2017-01-07 DIAGNOSIS — M5412 Radiculopathy, cervical region: Secondary | ICD-10-CM | POA: Diagnosis not present

## 2017-01-07 DIAGNOSIS — G890 Central pain syndrome: Secondary | ICD-10-CM | POA: Diagnosis not present

## 2017-01-12 ENCOUNTER — Ambulatory Visit (INDEPENDENT_AMBULATORY_CARE_PROVIDER_SITE_OTHER): Payer: Medicare Other | Admitting: Neurology

## 2017-01-12 ENCOUNTER — Encounter: Payer: Self-pay | Admitting: Neurology

## 2017-01-12 ENCOUNTER — Ambulatory Visit (HOSPITAL_COMMUNITY)
Admission: RE | Admit: 2017-01-12 | Discharge: 2017-01-12 | Disposition: A | Discharge status: Home / Self Care | Payer: Medicare Other | Source: Ambulatory Visit | Attending: Gastroenterology | Admitting: Gastroenterology

## 2017-01-12 ENCOUNTER — Ambulatory Visit: Payer: Medicare Other | Admitting: Neurology

## 2017-01-12 VITALS — BP 116/74 | HR 94 | Ht 68.0 in | Wt 124.5 lb

## 2017-01-12 DIAGNOSIS — I77810 Thoracic aortic ectasia: Secondary | ICD-10-CM | POA: Insufficient documentation

## 2017-01-12 DIAGNOSIS — R1312 Dysphagia, oropharyngeal phase: Secondary | ICD-10-CM | POA: Diagnosis not present

## 2017-01-12 DIAGNOSIS — R413 Other amnesia: Secondary | ICD-10-CM | POA: Diagnosis not present

## 2017-01-12 DIAGNOSIS — Z5181 Encounter for therapeutic drug level monitoring: Secondary | ICD-10-CM | POA: Diagnosis not present

## 2017-01-12 DIAGNOSIS — R634 Abnormal weight loss: Secondary | ICD-10-CM

## 2017-01-12 DIAGNOSIS — N200 Calculus of kidney: Secondary | ICD-10-CM | POA: Insufficient documentation

## 2017-01-12 DIAGNOSIS — K862 Cyst of pancreas: Secondary | ICD-10-CM | POA: Diagnosis not present

## 2017-01-12 DIAGNOSIS — R269 Unspecified abnormalities of gait and mobility: Secondary | ICD-10-CM

## 2017-01-12 DIAGNOSIS — G40209 Localization-related (focal) (partial) symptomatic epilepsy and epileptic syndromes with complex partial seizures, not intractable, without status epilepticus: Secondary | ICD-10-CM

## 2017-01-12 MED ORDER — IOPAMIDOL (ISOVUE-370) INJECTION 76%
100.0000 mL | Freq: Once | INTRAVENOUS | Status: AC | PRN
  Administered 2017-01-12: 100 mL via INTRAVENOUS

## 2017-01-12 NOTE — Patient Instructions (Signed)
   We will get blood work today and get a swallowing test.

## 2017-01-12 NOTE — Progress Notes (Signed)
Reason for visit: History seizures, memory disturbance, tremor  Eric Tran is an 66 y.o. male  History of present illness:  Eric Tran is a 66 year old right-handed white male with a history of cerebrovascular disease and a history of stroke. The patient has a mild left hemiparesis following a prior stroke event. He has developed a memory disturbance that is getting worse over time both for short-term and more distant memory. The patient has been tapered off of baclofen and Sinemet since last seen, this is helped the excessive daytime drowsiness. The patient is on Depakote and Trileptal, he has done well with the seizures and has not had any recurrence since last seen. The patient walks with a cane, he has fallen twice recently, he fell once yesterday and once on 12/28/2016. He will be going for cataract surgery in the near future. His caretaker indicates that he has had problems with dysphagia for liquids, he will choke 2 to 3 times a week. This has increased over the last 6 months. The patient has difficulty feeding himself sometimes because of the tremor, he is better with finger food. The patient may jerk or twitch at night, but he has not had any other seizure-type events. The patient continues to smoke a pack of cigarettes daily. He has been evaluated for a thoracic aortic aneurysm.  Past Medical History:  Diagnosis Date  . Abdominal aortic aneurysm ([place])   . Abnormality of gait 10/03/2014  . Anxiety   . Arthritis   . BPH (benign prostatic hyperplasia)   . Carpal tunnel syndrome   . Cerebrovascular disease 10/03/2014  . Chronic back pain    chronic narcotic use  . Chronic hepatitis C ([place]) 1980   Harvoni 2016, cured. HCV RNA negative 01/2016  . Chronic pain   . Degenerative disc disease   . Depression   . Head trauma   . Hepatic cirrhosis ([place]) 02/19/2016  . Hypertension   . Hyperthyroidism   . Memory difficulties 10/03/2014  . Partial complex seizure disorder without intractable  epilepsy ([place]) 10/03/2014  . Seizures ([place])    Epilepsy  . Stroke [facility]) 2012   left hemiplegia  . TIA (transient ischemic attack)   . Tremor   . Tremor, essential 10/02/2016  . UTI (lower urinary tract infection)     Past Surgical History:  Procedure Laterality Date  . EP IMPLANTABLE DEVICE N/A 11/15/2014   Procedure: Loop Recorder Insertion;  Surgeon: Thompson Grayer, MD;  Location: [place] [facility];  Service: Cardiovascular;  Laterality: N/A;  . ESOPHAGOGASTRODUODENOSCOPY (EGD) WITH PROPOFOL N/A 03/20/2016   Procedure: ESOPHAGOGASTRODUODENOSCOPY (EGD) WITH PROPOFOL;  Surgeon: Daneil Dolin, MD;  [facility];  Service: Endoscopy;  Laterality: N/A;  . HAND SURGERY     right  . NECK SURGERY    . right hand reconstruction      Family History  Problem Relation Age of Onset  . Cervical cancer Mother   . Heart failure Father   . Lung cancer Sister   . Breast cancer Sister   . Colon cancer Neg Hx   . Colon polyps Neg Hx     Social history:  reports that he has been smoking Cigarettes.  He started smoking about 54 years ago. He has a 45.00 pack-year smoking history. He has never used smokeless tobacco. He reports that he does not drink alcohol or use drugs.    Allergies  Allergen Reactions  . Keppra [Levetiracetam] Other (See Comments)  Suicidal ideation, anaphylaxsis  . Adhesive [Tape]     Severe rash  . Methadone     Delerium  . Morphine And Related Nausea And Vomiting    Can only take through IV.    Medications:  Prior to Admission medications   Medication Sig Start Date End Date Taking? Authorizing Provider  albuterol (PROVENTIL HFA;VENTOLIN HFA) 108 (90 Base) MCG/ACT inhaler Inhale 2 puffs into the lungs every 6 (six) hours as needed for wheezing or shortness of breath. 01/31/16  Yes Branch, Alphonse Guild, MD  ALPRAZolam Duanne Moron) 0.25 MG tablet Take 0.25 mg by mouth 3 (three) times daily as needed for anxiety.   Yes [provider]  clopidogrel (PLAVIX)  75 MG tablet Take 75 mg by mouth daily. Reported on 10/03/2015   Yes [provider]  divalproex (DEPAKOTE) 500 MG DR tablet Take 500 mg by mouth 2 (two) times daily.    Yes [provider]  docusate sodium (COLACE) 100 MG capsule Take 100 mg by mouth 2 (two) times daily.    Yes [provider]  DULoxetine (CYMBALTA) 30 MG capsule Take 60 mg by mouth daily.  05/15/16 05/15/17 Yes [provider]  fluticasone furoate-vilanterol (BREO ELLIPTA) 100-25 MCG/INH AEPB Inhale 1 puff into the lungs daily.   Yes [provider]  hydrALAZINE (APRESOLINE) 25 MG tablet Take 25 mg by mouth 2 (two) times daily.   Yes [provider]  lisinopril (PRINIVIL,ZESTRIL) 40 MG tablet Take 40 mg by mouth daily.   Yes [provider]  methimazole (TAPAZOLE) 5 MG tablet Take 5 mg by mouth 3 (three) times daily.   Yes [provider]  Oxcarbazepine (TRILEPTAL) 300 MG tablet Take 300 mg by mouth 2 (two) times daily.   Yes [provider]  oxyCODONE (ROXICODONE) 15 MG immediate release tablet Take 1 tablet by mouth 3 (three) times daily. 11/07/16  Yes [provider]  OxyCODONE ER (XTAMPZA ER) 18 MG C12A Take by mouth. 02/06/17  Yes [provider]  Oxycodone HCl 20 MG TABS Take 20 mg by mouth. 02/06/17  Yes [provider]  sildenafil (VIAGRA) 50 MG tablet Take 25 mg by mouth daily as needed for erectile dysfunction.    Yes [provider]  Tamsulosin HCl (FLOMAX) 0.4 MG CAPS Take 0.4 mg by mouth daily.    Yes [provider]  tiotropium (SPIRIVA HANDIHALER) 18 MCG inhalation capsule Place 1 capsule (18 mcg total) into inhaler and inhale daily. 01/31/16  Yes BranchAlphonse Guild, MD  traZODone (DESYREL) 50 MG tablet Take 50 mg by mouth at bedtime.   Yes [provider]  XTAMPZA ER 18 MG C12A Take 1 tablet by mouth 2 (two) times daily. 11/07/16  Yes [provider]    ROS:  Out of a complete  14 system review of symptoms, the patient complains only of the following symptoms, and all other reviewed systems are negative.  Fatigue, weight loss Ear pain Eye discharge, eye itching, eye redness, blurred vision Wheezing, shortness of breath, chest tightness Heart murmur Cold intolerance Constipation Insomnia, frequent waking, daytime sleepiness, snoring, sleep talking, acting out dreams Joint pain, back pain, achy muscles, muscle cramps, walking difficulty Bruising easily Memory loss, dizziness, headache, numbness, seizures, weakness, tremors Agitation, confusion, decreased concentration, depression, anxiety  Blood pressure 116/74, pulse 94, height 5\' 8"  (1.727 m), weight 124 lb 8 oz (56.5 kg).  Physical Exam  General: The patient is alert and cooperative at the time of the examination.  Respiratory: Lung fields are clear bilaterally.  Cardiovascular: Regular rate and rhythm, patient has a Grade IV/VI systolic ejection murmur in the aortic area with radiation into both carotids.  Neck: Bilateral bruits/cardiac murmur radiation noted.  Skin: No significant peripheral edema is noted.   Neurologic Exam  Mental status: The patient is alert and oriented x 2 at the time of the examination (not oriented to date). The Mini-Mental Status Examination done today shows a total score 23/30..   Cranial nerves: Facial symmetry is present. Speech is normal, no aphasia or dysarthria is noted. Extraocular movements are full. Visual fields are full.  Motor: The patient has good strength in all 4 extremities.  Sensory examination: Soft touch sensation is slightly decreased on the left face, arm, and leg.  Coordination: The patient has good finger-nose-finger and heel-to-shin bilaterally. A slight intention tremor seen bilaterally with finger-nose-finger.  Gait and station: The patient has a slightly wide-based gait, the patient holds the left arm in flexion with walking, he walks with a  cane. Tandem gait was not attempted. Romberg is negative. No drift is seen.  Reflexes: Deep tendon reflexes are symmetric.   Assessment/Plan:  1. Cerebrovascular disease  2. Reports of dysphagia  3. Memory disturbance  4. Gait disturbance  5. History of seizures  The patient has done well with seizure control. He is having increasing problems with dysphagia, we will get a modified barium swallow. The patient will have blood work done today, his memory has apparently continued to progress. He cannot tolerate Namenda, and he cannot take medication such as Aricept given the history of seizures. The patient will follow-up in about 6 months.  Jill Alexanders MD 01/12/2017 1:37 PM  [facility] [address] [place], [place] [address]  Phone [phone] Fax [phone]

## 2017-01-14 ENCOUNTER — Telehealth: Payer: Self-pay | Admitting: Cardiology

## 2017-01-14 LAB — COMPREHENSIVE METABOLIC PANEL
ALT: 8 IU/L (ref 0–44)
AST: 14 IU/L (ref 0–40)
Albumin/Globulin Ratio: 1.8 (ref 1.2–2.2)
Albumin: 4.4 g/dL (ref 3.6–4.8)
Alkaline Phosphatase: 56 IU/L (ref 39–117)
BUN/Creatinine Ratio: 16 (ref 10–24)
BUN: 14 mg/dL (ref 8–27)
Bilirubin Total: 0.5 mg/dL (ref 0.0–1.2)
CO2: 23 mmol/L (ref 20–29)
Calcium: 9.5 mg/dL (ref 8.6–10.2)
Chloride: 102 mmol/L (ref 96–106)
Creatinine, Ser: 0.89 mg/dL (ref 0.76–1.27)
GFR calc Af Amer: 103 mL/min/{1.73_m2} (ref >59)
GFR calc non Af Amer: 89 mL/min/{1.73_m2} (ref >59)
Globulin, Total: 2.5 g/dL (ref 1.5–4.5)
Glucose: 82 mg/dL (ref 65–99)
Potassium: 5.3 mmol/L — ABNORMAL HIGH (ref 3.5–5.2)
Sodium: 145 mmol/L — ABNORMAL HIGH (ref 134–144)
Total Protein: 6.9 g/dL (ref 6.0–8.5)

## 2017-01-14 LAB — CBC WITH DIFFERENTIAL/PLATELET
Basophils Absolute: 0 10*3/uL (ref 0.0–0.2)
Basos: 1 %
EOS (ABSOLUTE): 0.1 10*3/uL (ref 0.0–0.4)
Eos: 2 %
Hematocrit: 43 % (ref 37.5–51.0)
Hemoglobin: 15 g/dL (ref 13.0–17.7)
Immature Grans (Abs): 0 10*3/uL (ref 0.0–0.1)
Immature Granulocytes: 0 %
Lymphocytes Absolute: 2.4 10*3/uL (ref 0.7–3.1)
Lymphs: 43 %
MCH: 31.5 pg (ref 26.6–33.0)
MCHC: 34.9 g/dL (ref 31.5–35.7)
MCV: 90 fL (ref 79–97)
Monocytes Absolute: 0.6 10*3/uL (ref 0.1–0.9)
Monocytes: 11 %
Neutrophils Absolute: 2.4 10*3/uL (ref 1.4–7.0)
Neutrophils: 43 %
Platelets: 214 10*3/uL (ref 150–379)
RBC: 4.76 x10E6/uL (ref 4.14–5.80)
RDW: 14.1 % (ref 12.3–15.4)
WBC: 5.6 10*3/uL (ref 3.4–10.8)

## 2017-01-14 LAB — AMMONIA: Ammonia: 96 ug/dL (ref 27–102)

## 2017-01-14 NOTE — Telephone Encounter (Signed)
LMOVM requesting that pt send manual transmission b/c home monitor has not updated in at least 14 days.    

## 2017-01-15 ENCOUNTER — Telehealth: Payer: Self-pay | Admitting: *Deleted

## 2017-01-15 NOTE — Telephone Encounter (Signed)
I have spoken with wife April this morning and per CW, reviewed lab results as below.  She verbalized understanding of same/fim

## 2017-01-15 NOTE — Telephone Encounter (Signed)
-----   Message from Kathrynn Ducking, MD sent at 01/14/2017  5:29 PM EDT ----- Blood work is unremarkable with exception of minimal elevation of sodium and potassium levels, not clinically significant. Ammonia level is normal. Please call the patient. ----- Message ----- From: Interface, [facility] Lab Results In Sent: 01/13/2017   7:45 AM To: Kathrynn Ducking, MD

## 2017-01-17 LAB — CUP PACEART REMOTE DEVICE CHECK
Date Time Interrogation Session: 20180708134058
Implantable Pulse Generator Implant Date: 20160518

## 2017-01-27 ENCOUNTER — Other Ambulatory Visit: Payer: Self-pay

## 2017-01-27 DIAGNOSIS — K7400 Hepatic fibrosis, unspecified: Secondary | ICD-10-CM

## 2017-01-30 ENCOUNTER — Other Ambulatory Visit (HOSPITAL_COMMUNITY): Payer: Self-pay | Admitting: Specialist

## 2017-01-30 DIAGNOSIS — R1319 Other dysphagia: Secondary | ICD-10-CM

## 2017-02-03 ENCOUNTER — Ambulatory Visit (INDEPENDENT_AMBULATORY_CARE_PROVIDER_SITE_OTHER): Payer: Medicare Other | Admitting: *Deleted

## 2017-02-03 DIAGNOSIS — I6310 Cerebral infarction due to embolism of unspecified precerebral artery: Secondary | ICD-10-CM | POA: Diagnosis not present

## 2017-02-04 NOTE — Progress Notes (Signed)
Carelink Summary Report / Loop Recorder 

## 2017-02-05 DIAGNOSIS — J449 Chronic obstructive pulmonary disease, unspecified: Secondary | ICD-10-CM | POA: Diagnosis not present

## 2017-02-05 DIAGNOSIS — G4733 Obstructive sleep apnea (adult) (pediatric): Secondary | ICD-10-CM | POA: Diagnosis not present

## 2017-02-05 DIAGNOSIS — I10 Essential (primary) hypertension: Secondary | ICD-10-CM | POA: Diagnosis not present

## 2017-02-06 NOTE — Progress Notes (Signed)
Please let patient know that I apologize for the delay on getting results to him. I have been out of town.  His blood vessels that feed the bowel appears to be open adequately making weight loss due to ischemia less likely.  He has stable lesions in body of pancreas and would recommend follow up imaging in one year, usually MRI but he may not be a candidate due to loop recorder. We will need to figure that out. 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 Please forward to cardiologist and speak with nurse about the findings asking cardiology to make further recommendations.   KEEP OV WITH RMR COMING UP LATER THIS MONTH.

## 2017-02-10 ENCOUNTER — Other Ambulatory Visit: Payer: Self-pay

## 2017-02-10 DIAGNOSIS — I712 Thoracic aortic aneurysm, without rupture, unspecified: Secondary | ICD-10-CM

## 2017-02-10 NOTE — Progress Notes (Unsigned)
Referral placed.

## 2017-02-11 ENCOUNTER — Telehealth: Payer: Self-pay | Admitting: Internal Medicine

## 2017-02-11 LAB — CUP PACEART REMOTE DEVICE CHECK
Date Time Interrogation Session: 20180807141238
Implantable Pulse Generator Implant Date: 20160518

## 2017-02-11 NOTE — Telephone Encounter (Signed)
Letter mailed

## 2017-02-11 NOTE — Telephone Encounter (Signed)
RECALL FOR ULTRASOUND 

## 2017-02-12 ENCOUNTER — Encounter (HOSPITAL_COMMUNITY): Payer: Self-pay | Admitting: Speech Pathology

## 2017-02-12 ENCOUNTER — Ambulatory Visit (HOSPITAL_COMMUNITY): Payer: Medicare Other | Attending: Neurology | Admitting: Speech Pathology

## 2017-02-12 ENCOUNTER — Telehealth: Payer: Self-pay

## 2017-02-12 ENCOUNTER — Ambulatory Visit (HOSPITAL_COMMUNITY)
Admission: RE | Admit: 2017-02-12 | Discharge: 2017-02-12 | Disposition: A | Discharge status: Home / Self Care | Payer: Medicare Other | Source: Ambulatory Visit | Attending: Neurology | Admitting: Neurology

## 2017-02-12 DIAGNOSIS — R1312 Dysphagia, oropharyngeal phase: Secondary | ICD-10-CM | POA: Diagnosis not present

## 2017-02-12 DIAGNOSIS — R1319 Other dysphagia: Secondary | ICD-10-CM | POA: Diagnosis not present

## 2017-02-12 DIAGNOSIS — R1310 Dysphagia, unspecified: Secondary | ICD-10-CM | POA: Diagnosis not present

## 2017-02-12 NOTE — Therapy (Signed)
Eric Tran, [place], [address] Phone: [phone]   Fax:  [phone]  Modified Barium Swallow  Patient Details  Name: Eric Tran MRN: [id number] Date of Birth: 11-17-1950 No Data Recorded  Encounter Date: 02/12/2017      End of Session - 02/12/17 1554    Visit Number 1   Number of Visits 1   Authorization Type UHC Medicare/ Medicaid   SLP Start Time 1310   SLP Stop Time  1340   SLP Time Calculation (min) 30 min   Activity Tolerance Patient tolerated treatment well      Past Medical History:  Diagnosis Date  . Abdominal aortic aneurysm ([place])   . Abnormality of gait 10/03/2014  . Anxiety   . Arthritis   . BPH (benign prostatic hyperplasia)   . Carpal tunnel syndrome   . Cerebrovascular disease 10/03/2014  . Chronic back pain    chronic narcotic use  . Chronic hepatitis C ([place]) 1980   Harvoni 2016, cured. HCV RNA negative 01/2016  . Chronic pain   . Degenerative disc disease   . Depression   . Head trauma   . Hepatic cirrhosis ([place]) 02/19/2016  . Hypertension   . Hyperthyroidism   . Memory difficulties 10/03/2014  . Partial complex seizure disorder without intractable epilepsy ([place]) 10/03/2014  . Seizures ([place])    Epilepsy  . Stroke [facility]) 2012   left hemiplegia  . TIA (transient ischemic attack)   . Tremor   . Tremor, essential 10/02/2016  . UTI (lower urinary tract infection)     Past Surgical History:  Procedure Laterality Date  . EP IMPLANTABLE DEVICE N/A 11/15/2014   Procedure: Loop Recorder Insertion;  Surgeon: Thompson Grayer, MD;  Location: [place] [facility];  Service: Cardiovascular;  Laterality: N/A;  . ESOPHAGOGASTRODUODENOSCOPY (EGD) WITH PROPOFOL N/A 03/20/2016   Procedure: ESOPHAGOGASTRODUODENOSCOPY (EGD) WITH PROPOFOL;  Surgeon: Daneil Dolin, MD;  [facility];  Service: Endoscopy;  Laterality: N/A;  . HAND SURGERY     right  . NECK SURGERY    . right hand reconstruction       There were no vitals filed for this visit.      Subjective Assessment - 02/12/17 1549    Subjective "I don't have a problem."   Special Tests MBSS   Currently in Pain? No/denies             General - 02/12/17 1551      General Information   Date of Onset 01/13/17   HPI [name] is a 66 year old right-handed white male with a history of cerebrovascular disease and a history of stroke. The patient has a mild left hemiparesis following a prior stroke event. He has developed a memory disturbance that is getting worse over time both for short-term and more distant memory. The patient has been tapered off of baclofen and Sinemet since last seen, this is helped the excessive daytime drowsiness. The patient is on Depakote and Trileptal, he has done well with the seizures and has not had any recurrence since last seen. The patient walks with a cane, he has fallen twice recently, he fell once yesterday and once on 12/28/2016. He will be going for cataract surgery in the near future. His caretaker indicates that he has had problems with dysphagia for liquids, he will choke 2 to 3 times a week. This has increased over the last 6 months. The patient has difficulty feeding himself sometimes  because of the tremor, he is better with finger food. The patient may jerk or twitch at night, but he has not had any other seizure-type events. The patient continues to smoke a pack of cigarettes daily. He has been evaluated for a thoracic aortic aneurysm. Pt referred by Dr. Margette Tran for MBSS due to reports of Pt coughing with liquids several times a week.   Type of Study MBS-Modified Barium Swallow Study   Previous Swallow Assessment None on record   Diet Prior to this Study Regular;Thin liquids   Temperature Spikes Noted No   Respiratory Status Room air   History of Recent Intubation No   Behavior/Cognition Alert;Cooperative;Pleasant mood  memory impairment   Oral Cavity Assessment Within  Functional Limits   Oral Care Completed by SLP No   Oral Cavity - Dentition Edentulous   Vision Functional for self feeding   Self-Feeding Abilities Able to feed self   Patient Positioning Upright in chair   Baseline Vocal Quality Normal   Volitional Cough Strong   Volitional Swallow Able to elicit   Anatomy Within functional limits  Previous C5-6 fusion   Pharyngeal Secretions Not observed secondary MBS            Oral Preparation/Oral Phase - Feb 24, 2017 1552      Oral Preparation/Oral Phase   Oral Phase Impaired     Oral - Solids   Oral - Regular Imparied mastication     Electrical stimulation - Oral Phase   Was Electrical Stimulation Used No          Pharyngeal Phase - 02-24-2017 1552      Pharyngeal Phase   Pharyngeal Phase Within functional limits     Electrical Stimulation - Pharyngeal Phase   Was Electrical Stimulation Used No          Cricopharyngeal Phase - 02/24/2017 1552      Cervical Esophageal Phase   Cervical Esophageal Phase Within functional limits                  Plan - 02/24/2017 1619    Clinical Impression Statement Pt assessed in the lateral postion for MBSS and presented with barium tinged thin (tsp, cup, straw), puree, regular textures, and barium tablet with thin all self presented. Pt edentulous and demonstrated decreased mastication of solids and swallowed the bolus with some pieces unmasticated. Swallow was consistently triggered after spilling to the pyriforms with liquids (tsp, cup, straw). No penetration or aspiration observed throughout the study. Esophageal sweep completed after presenting barium tablet and no stasis noted in the esophagus. The study was reviewed with the patient and his wife. His wife reports that he occasionally coughs during meals. SLP recommended that dry, difficult to masticate solids be presented in moistened fashion and/or cut up well due to Pt's propensity to not chew thoroughly. SLP also reviewed the  relationship between aspiration risk in individuals with COPD (Pt is a longtime smoker). No dysphagia therapy or intervention indicated at this time, however Pt may benefit from PT evaluation due to increase falls at home recently. Will defer to Dr. Jannifer Tran.      Patient will benefit from skilled therapeutic intervention in order to improve the following deficits and impairments:   Dysphagia, oropharyngeal phase      G-Codes - 24-Feb-2017 1619    Functional Assessment Tool Used Clinical judgment; MBSS   Functional Limitations Swallowing   Swallow Current Status (U1324) 0 percent impaired, limited or restricted   Swallow Goal Status (M0102)  0 percent impaired, limited or restricted   Swallow Discharge Status [phone]) 0 percent impaired, limited or restricted          Recommendations/Treatment - 02/12/17 1553      Swallow Evaluation Recommendations   SLP Diet Recommendations Age appropriate regular   Liquid Administration via Cup;Straw   Medication Administration Whole meds with liquid   Supervision Patient able to self feed   Postural Changes Seated upright at 90 degrees;Remain upright for at least 30 minutes after feeds/meals          Prognosis - 02/12/17 1553      Prognosis   Prognosis for Safe Diet Advancement Good   Barriers to Reach Goals Cognitive deficits     Individuals Consulted   Consulted and Agree with Results and Recommendations Patient;Family member/caregiver   Family Member Consulted Wife   Report Sent to  Referring physician      Problem List Patient Active Problem List   Diagnosis Date Noted  . Anorexia 11/20/2016  . Tremor, essential 10/02/2016  . Colon cancer screening 05/20/2016  . Hepatic steatosis 05/20/2016  . Hepatic fibrosis 02/19/2016  . Melena 02/19/2016  . Constipation 02/19/2016  . Abdominal pain 02/19/2016  . Essential hypertension 11/15/2014  . Cerebrovascular disease 10/03/2014  . Abnormality of gait 10/03/2014  . Memory  difficulties 10/03/2014  . Partial complex seizure disorder without intractable epilepsy ([place]) 10/03/2014  . TIA (transient ischemic attack) 02/02/2014  . Head injuries 07/24/2013  . Pyelonephritis 07/27/2012  . CAP (community acquired pneumonia) 07/26/2012  . Dehydration 07/26/2012  . Intractable headache 07/26/2012  . Viral syndrome 07/26/2012  . Chronic pain 07/26/2012  . Anxiety disorder 07/26/2012  . Flank pain, acute 07/26/2012  . Hyperthyroidism 07/26/2012  . Tobacco abuse 07/26/2012  . Hyponatremia 07/26/2012  . Chronic hepatitis C ([place]) 05/01/2011  . Elevated liver function tests 05/01/2011  . Weight loss, unintentional 05/01/2011   Thank you,  Genene Churn, [place]  [facility] 02/12/2017, 4:20 PM  [place] [address] [place], [place], [address] Phone: [phone]   Fax:  [phone]  Name: Eric Tran MRN: [id number] Date of Birth: [birth year]

## 2017-02-12 NOTE — Telephone Encounter (Signed)
Called pt, no answer. Left message for pt to return call. Referral was placed.

## 2017-02-12 NOTE — Telephone Encounter (Signed)
-----   Message from Jonathan F Branch, MD sent at 02/10/2017 12:01 PM EDT ----- Please refer patient to CT surgery to help monitor his thoracic aneurysm   J Branch MD ----- Message ----- From: Lawson, Julie H, LPN Sent: 02/06/2017  10:06 AM To: Jonathan F Branch, MD  Forwarding to Dr.Branch.  

## 2017-02-18 ENCOUNTER — Telehealth: Payer: Self-pay

## 2017-02-18 ENCOUNTER — Other Ambulatory Visit: Payer: Self-pay

## 2017-02-18 DIAGNOSIS — Z8249 Family history of ischemic heart disease and other diseases of the circulatory system: Secondary | ICD-10-CM

## 2017-02-18 NOTE — Telephone Encounter (Signed)
Order placed, unable to reach pt. No answer, unable to leave message.

## 2017-02-18 NOTE — Telephone Encounter (Signed)
-----   Message from Arnoldo Lenis, MD sent at 02/10/2017 12:01 PM EDT ----- Please refer patient to CT surgery to help monitor his thoracic aneurysm   [name] MD ----- Message ----- From: Claudina Lick, LPN Sent: 6/46/8032  10:06 AM To: Arnoldo Lenis, MD  Forwarding to Dr.Branch.

## 2017-02-18 NOTE — Telephone Encounter (Signed)
-----   Message from Arnoldo Lenis, MD sent at 02/10/2017 12:01 PM EDT ----- Please refer patient to CT surgery to help monitor his thoracic aneurysm   [name] MD ----- Message ----- From: Claudina Lick, LPN Sent: 4/65/0354  10:06 AM To: Arnoldo Lenis, MD  Forwarding to Dr.Branch.

## 2017-02-18 NOTE — Telephone Encounter (Signed)
Called to follow up with pt. No answer, unable to leave message.

## 2017-02-19 ENCOUNTER — Other Ambulatory Visit: Payer: Self-pay | Admitting: *Deleted

## 2017-02-19 ENCOUNTER — Telehealth: Payer: Self-pay | Admitting: *Deleted

## 2017-02-19 NOTE — Telephone Encounter (Signed)
I called the wife. The patient had a MRI study done on 01/26/2017. This shows evidence of a left anterior basal ganglia stroke associated with encephalomalacia, this is a new event from 2016, but likely is not a recent or subacute stroke. I discussed this with the wife.

## 2017-02-20 ENCOUNTER — Ambulatory Visit: Payer: Self-pay | Admitting: Internal Medicine

## 2017-02-24 ENCOUNTER — Encounter (HOSPITAL_COMMUNITY): Payer: Self-pay | Admitting: *Deleted

## 2017-02-24 ENCOUNTER — Emergency Department (HOSPITAL_COMMUNITY)
Admission: EM | Admit: 2017-02-24 | Discharge: 2017-02-24 | Disposition: A | Discharge status: Home / Self Care | Payer: Medicare Other | Attending: Emergency Medicine | Admitting: Emergency Medicine

## 2017-02-24 ENCOUNTER — Encounter: Payer: Self-pay | Admitting: Internal Medicine

## 2017-02-24 ENCOUNTER — Ambulatory Visit (INDEPENDENT_AMBULATORY_CARE_PROVIDER_SITE_OTHER): Payer: Medicare Other | Admitting: Internal Medicine

## 2017-02-24 ENCOUNTER — Emergency Department (HOSPITAL_COMMUNITY): Payer: Medicare Other

## 2017-02-24 VITALS — BP 76/58 | HR 84 | Temp 96.9°F | Ht 68.0 in | Wt 123.4 lb

## 2017-02-24 DIAGNOSIS — I951 Orthostatic hypotension: Secondary | ICD-10-CM | POA: Diagnosis not present

## 2017-02-24 DIAGNOSIS — W19XXXA Unspecified fall, initial encounter: Secondary | ICD-10-CM | POA: Diagnosis not present

## 2017-02-24 DIAGNOSIS — F17210 Nicotine dependence, cigarettes, uncomplicated: Secondary | ICD-10-CM | POA: Diagnosis not present

## 2017-02-24 DIAGNOSIS — Z79899 Other long term (current) drug therapy: Secondary | ICD-10-CM | POA: Diagnosis not present

## 2017-02-24 DIAGNOSIS — I10 Essential (primary) hypertension: Secondary | ICD-10-CM | POA: Insufficient documentation

## 2017-02-24 DIAGNOSIS — R1314 Dysphagia, pharyngoesophageal phase: Secondary | ICD-10-CM

## 2017-02-24 DIAGNOSIS — E039 Hypothyroidism, unspecified: Secondary | ICD-10-CM | POA: Insufficient documentation

## 2017-02-24 DIAGNOSIS — R531 Weakness: Secondary | ICD-10-CM | POA: Diagnosis not present

## 2017-02-24 DIAGNOSIS — Z7902 Long term (current) use of antithrombotics/antiplatelets: Secondary | ICD-10-CM | POA: Insufficient documentation

## 2017-02-24 DIAGNOSIS — R55 Syncope and collapse: Secondary | ICD-10-CM | POA: Diagnosis not present

## 2017-02-24 DIAGNOSIS — R634 Abnormal weight loss: Secondary | ICD-10-CM

## 2017-02-24 LAB — CBC WITH DIFFERENTIAL/PLATELET
Basophils Absolute: 0 10*3/uL (ref 0.0–0.1)
Basophils Relative: 1 %
Eosinophils Absolute: 0.1 10*3/uL (ref 0.0–0.7)
Eosinophils Relative: 1 %
HCT: 41.4 % (ref 39.0–52.0)
Hemoglobin: 14.6 g/dL (ref 13.0–17.0)
Lymphocytes Relative: 47 %
Lymphs Abs: 2.8 10*3/uL (ref 0.7–4.0)
MCH: 32 pg (ref 26.0–34.0)
MCHC: 35.3 g/dL (ref 30.0–36.0)
MCV: 90.8 fL (ref 78.0–100.0)
Monocytes Absolute: 0.8 10*3/uL (ref 0.1–1.0)
Monocytes Relative: 13 %
Neutro Abs: 2.2 10*3/uL (ref 1.7–7.7)
Neutrophils Relative %: 38 %
Platelets: 180 10*3/uL (ref 150–400)
RBC: 4.56 MIL/uL (ref 4.22–5.81)
RDW: 12.7 % (ref 11.5–15.5)
WBC: 5.9 10*3/uL (ref 4.0–10.5)

## 2017-02-24 LAB — BASIC METABOLIC PANEL
Anion gap: 8 (ref 5–15)
BUN: 21 mg/dL — ABNORMAL HIGH (ref 6–20)
CO2: 28 mmol/L (ref 22–32)
Calcium: 8.8 mg/dL — ABNORMAL LOW (ref 8.9–10.3)
Chloride: 103 mmol/L (ref 101–111)
Creatinine, Ser: 1.08 mg/dL (ref 0.61–1.24)
GFR calc Af Amer: >60 mL/min (ref >60)
GFR calc non Af Amer: >60 mL/min (ref >60)
Glucose, Bld: 83 mg/dL (ref 65–99)
Potassium: 3.4 mmol/L — ABNORMAL LOW (ref 3.5–5.1)
Sodium: 139 mmol/L (ref 135–145)

## 2017-02-24 LAB — HEPATIC FUNCTION PANEL
ALT: 8 U/L — ABNORMAL LOW (ref 17–63)
AST: 16 U/L (ref 15–41)
Albumin: 3.9 g/dL (ref 3.5–5.0)
Alkaline Phosphatase: 40 U/L (ref 38–126)
Bilirubin, Direct: 0.1 mg/dL (ref 0.1–0.5)
Indirect Bilirubin: 0.5 mg/dL (ref 0.3–0.9)
Total Bilirubin: 0.6 mg/dL (ref 0.3–1.2)
Total Protein: 6.5 g/dL (ref 6.5–8.1)

## 2017-02-24 LAB — TROPONIN I: Troponin I: <0.03 ng/mL (ref <0.03)

## 2017-02-24 MED ORDER — SODIUM CHLORIDE 0.9 % IV BOLUS (SEPSIS)
500.0000 mL | Freq: Once | INTRAVENOUS | Status: AC
  Administered 2017-02-24: 500 mL via INTRAVENOUS

## 2017-02-24 MED ORDER — SODIUM CHLORIDE 0.9 % IV BOLUS (SEPSIS)
1000.0000 mL | Freq: Once | INTRAVENOUS | Status: AC
  Administered 2017-02-24: 1000 mL via INTRAVENOUS

## 2017-02-24 NOTE — ED Provider Notes (Signed)
Eric Tran DEPT Provider Note   CSN: [id number] Arrival date & time: 02/24/17  1707     History   Chief Complaint Chief Complaint  Patient presents with  . Hypotension    HPI Eric Tran is a 66 y.o. male.  HPI  66 y.o. Man presents to Ed from Dr. Roseanne Kaufman office after fall getting out of car.  Patient denies getting weak or lightheaded and states that he fell but unable to be any more specific.   Patient states he lives with wife, her daughter- sttes wife just left.  Past Medical History:  Diagnosis Date  . Abdominal aortic aneurysm ([place])   . Abnormality of gait 10/03/2014  . Anxiety   . Arthritis   . BPH (benign prostatic hyperplasia)   . Carpal tunnel syndrome   . Cerebrovascular disease 10/03/2014  . Chronic back pain    chronic narcotic use  . Chronic hepatitis C ([place]) 1980   Harvoni 2016, cured. HCV RNA negative 01/2016  . Chronic pain   . Degenerative disc disease   . Depression   . Head trauma   . Hepatic cirrhosis ([place]) 02/19/2016  . Hypertension   . Hyperthyroidism   . Memory difficulties 10/03/2014  . Partial complex seizure disorder without intractable epilepsy ([place]) 10/03/2014  . Seizures ([place])    Epilepsy  . Stroke [facility]) 2012   left hemiplegia  . TIA (transient ischemic attack)   . Tremor   . Tremor, essential 10/02/2016  . UTI (lower urinary tract infection)     Patient Active Problem List   Diagnosis Date Noted  . Anorexia 11/20/2016  . Tremor, essential 10/02/2016  . Colon cancer screening 05/20/2016  . Hepatic steatosis 05/20/2016  . Hepatic fibrosis 02/19/2016  . Melena 02/19/2016  . Constipation 02/19/2016  . Abdominal pain 02/19/2016  . Essential hypertension 11/15/2014  . Cerebrovascular disease 10/03/2014  . Abnormality of gait 10/03/2014  . Memory difficulties 10/03/2014  . Partial complex seizure disorder without intractable epilepsy ([place]) 10/03/2014  . TIA (transient ischemic attack) 02/02/2014  . Head injuries 07/24/2013  .  Pyelonephritis 07/27/2012  . CAP (community acquired pneumonia) 07/26/2012  . Dehydration 07/26/2012  . Intractable headache 07/26/2012  . Viral syndrome 07/26/2012  . Chronic pain 07/26/2012  . Anxiety disorder 07/26/2012  . Flank pain, acute 07/26/2012  . Hyperthyroidism 07/26/2012  . Tobacco abuse 07/26/2012  . Hyponatremia 07/26/2012  . Chronic hepatitis C ([place]) 05/01/2011  . Elevated liver function tests 05/01/2011  . Weight loss, unintentional 05/01/2011    Past Surgical History:  Procedure Laterality Date  . EP IMPLANTABLE DEVICE N/A 11/15/2014   Procedure: Loop Recorder Insertion;  Surgeon: Thompson Grayer, MD;  Location: [place] CV LAB;  Service: Cardiovascular;  Laterality: N/A;  . ESOPHAGOGASTRODUODENOSCOPY (EGD) WITH PROPOFOL N/A 03/20/2016   Procedure: ESOPHAGOGASTRODUODENOSCOPY (EGD) WITH PROPOFOL;  Surgeon: Daneil Dolin, MD;  [facility];  Service: Endoscopy;  Laterality: N/A;  . HAND SURGERY     right  . NECK SURGERY    . right hand reconstruction         Home Medications    Prior to Admission medications   Medication Sig Start Date End Date Taking? Authorizing Provider  albuterol (PROVENTIL HFA;VENTOLIN HFA) 108 (90 Base) MCG/ACT inhaler Inhale 2 puffs into the lungs every 6 (six) hours as needed for wheezing or shortness of breath. 01/31/16  Yes Branch, Alphonse Guild, MD  ALPRAZolam Duanne Moron) 0.25 MG tablet Take 0.25 mg by mouth 3 (three) times daily  as needed for anxiety.   Yes [provider]  clopidogrel (PLAVIX) 75 MG tablet Take 75 mg by mouth daily. Reported on 10/03/2015   Yes [provider]  divalproex (DEPAKOTE) 500 MG DR tablet Take 250 mg by mouth 2 (two) times daily.    Yes [provider]  docusate sodium (COLACE) 100 MG capsule Take 100 mg by mouth 2 (two) times daily.    Yes [provider]  DULoxetine (CYMBALTA) 60 MG capsule Take 60 mg by mouth daily.  05/15/16 05/15/17 Yes [provider]    fluticasone furoate-vilanterol (BREO ELLIPTA) 100-25 MCG/INH AEPB Inhale 1 puff into the lungs 2 (two) times daily.    Yes [provider]  Fluticasone-Umeclidin-Vilant (TRELEGY ELLIPTA) 100-62.5-25 MCG/INH AEPB Inhale 1 puff into the lungs daily.   Yes [provider]  hydrALAZINE (APRESOLINE) 25 MG tablet Take 25 mg by mouth 2 (two) times daily.   Yes [provider]  lisinopril (PRINIVIL,ZESTRIL) 40 MG tablet Take 40 mg by mouth daily.   Yes [provider]  methimazole (TAPAZOLE) 5 MG tablet Take 5 mg by mouth every evening.    Yes [provider]  Oxcarbazepine (TRILEPTAL) 300 MG tablet Take 300 mg by mouth 2 (two) times daily.   Yes [provider]  OxyCODONE ER (XTAMPZA ER) 18 MG C12A Take 1 capsule by mouth 2 (two) times daily.  02/06/17  Yes [provider]  Oxycodone HCl 20 MG TABS Take 1 tablet by mouth every 8 (eight) hours.  11/07/16  Yes [provider]  sildenafil (VIAGRA) 50 MG tablet Take 25 mg by mouth daily as needed for erectile dysfunction.    Yes [provider]  Tamsulosin HCl (FLOMAX) 0.4 MG CAPS Take 0.4 mg by mouth every evening.    Yes [provider]  tiotropium (SPIRIVA HANDIHALER) 18 MCG inhalation capsule Place 1 capsule (18 mcg total) into inhaler and inhale daily. 01/31/16  Yes BranchAlphonse Guild, MD  traZODone (DESYREL) 50 MG tablet Take 50 mg by mouth at bedtime.   Yes [provider]    Family History Family History  Problem Relation Age of Onset  . Cervical cancer Mother   . Heart failure Father   . Lung cancer Sister   . Breast cancer Sister   . Colon cancer Neg Hx   . Colon polyps Neg Hx     Social History Social History  Substance Use Topics  . Smoking status: Current Every Day Smoker    Packs/day: 1.00    Years: 45.00    Types: Cigarettes    Start date: 07/18/1962  . Smokeless tobacco: Never Used  . Alcohol use No     Comment: occasionally      Allergies   Keppra [levetiracetam]; Adhesive [tape]; Methadone; and Morphine and related   Review of Systems Review of Systems  Constitutional: Negative for activity change, appetite change, chills and fever.  HENT: Negative.   Eyes: Negative.   Respiratory: Negative.   Cardiovascular: Negative.   Gastrointestinal: Negative.   Endocrine: Negative.   Genitourinary: Negative.   Musculoskeletal: Negative.   Skin: Negative.   Neurological: Positive for light-headedness.  Hematological: Negative.   Psychiatric/Behavioral: Negative.   All other systems reviewed and are negative.    Physical Exam Updated Vital Signs BP 129/80   Pulse 61   Temp 97.9 F (36.6 C) (Oral)   Resp 18   Ht 1.727 m (5\' 8" )   Wt 55.8 kg (123 lb)  SpO2 97%   BMI 18.70 kg/m   Physical Exam  Constitutional: He is oriented to person, place, and time. He appears well-developed.  HENT:  Head: Normocephalic and atraumatic.  Right Ear: External ear normal.  Left Ear: External ear normal.  Nose: Nose normal.  Eyes: Pupils are equal, round, and reactive to light. EOM are normal.  Neck: Normal range of motion. Neck supple. No tracheal deviation present.  Cardiovascular: Normal rate, regular rhythm, normal heart sounds and intact distal pulses.   Pulmonary/Chest: Effort normal.  Abdominal: Soft. Bowel sounds are normal.  Musculoskeletal: Normal range of motion.  Neurological: He is alert and oriented to person, place, and time.  Skin: Skin is warm and dry.  Psychiatric: He has a normal mood and affect. His behavior is normal.  Nursing note and vitals reviewed.    ED Treatments / Results  Labs (all labs ordered are listed, but only abnormal results are displayed) Labs Reviewed  BASIC METABOLIC PANEL - Abnormal; Notable for the following:       Result Value   Potassium 3.4 (*)    BUN 21 (*)    Calcium 8.8 (*)    All other components within normal limits  CBC WITH DIFFERENTIAL/PLATELET   TROPONIN I    EKG  EKG Interpretation  Date/Time:  Tuesday February 24 2017 18:25:01 EDT Ventricular Rate:  68 PR Interval:    QRS Duration: 94 QT Interval:  442 QTC Calculation: 471 R Axis:   55 Text Interpretation:  Sinus rhythm Atrial premature complex Probable left atrial enlargement RSR' in V1 or V2, probably normal variant LVH with secondary repolarization abnormality Confirmed by Pattricia Boss [phone]) on 02/24/2017 7:24:41 PM       Radiology No results found.  Procedures Procedures (including critical care time)  Medications Ordered in ED Medications  sodium chloride 0.9 % bolus 1,000 mL (0 mLs Intravenous Stopped 02/24/17 1821)     Initial Impression / Assessment and Plan / ED Course  I have reviewed the triage vital signs and the nursing notes.  Pertinent labs & imaging results that were available during my care of the patient were reviewed by me and considered in my medical decision making (see chart for details).    66 year old male presents today with orthostatic hypotension and near syncopal event. Here his labs are normal. He was initially somewhat hypotensive but this has resolved with some IV fluids. EKG shows no change from prior and troponin is normal. He is on an extensive amount of medications which could contribute. Currently I see no indication for hospitalization. He is advised to follow-up with his primary care physician in the next 1-2 days for recheck. He is advised of return precautions and he voices understanding.  Vitals:   02/24/17 1845 02/24/17 2030  BP:  (!) 142/85  Pulse:  65  Resp:  15  Temp: 97.6 F (36.4 C)   SpO2:  97%    Final Clinical Impressions(s) / ED Diagnoses   Final diagnoses:  Near syncope  Orthostatic hypotension    New Prescriptions New Prescriptions   No medications on file     Pattricia Boss, MD 02/24/17 2104

## 2017-02-24 NOTE — ED Notes (Signed)
April [phone]

## 2017-02-24 NOTE — Progress Notes (Signed)
Primary Care Physician:  Tawni Carnes, PA-C Primary Gastroenterologist:  Dr. Gala Romney  Pre-Procedure History & Physical: HPI:  Eric Tran is a 66 y.o. male here for her follow-up weight loss and failure to thrive. Patient has extensive medical history and comorbidities including multiple CVAs leaving him with a mild left hemiplegia and failure to thrive. Mild oropharyngeal dysphagia symptoms evaluated by speech pathology. Not felt to have significant oropharyngeal dysfunction.  Patient has had a fairly extensive evaluation. CTA of the abdomen demonstrated no significant mesenteric stenosis. No occult pulmonary lesion. 2 small pancreatic lesions for which a one-year follow-up CT is been recommended. Patient in RN wife states that he does get hungry only intermittently. When he does get hungry he eats very well. Historically, he doesn't eat breakfast. But when he does eat he is able to consume a regular meal.   I note his weight was 131 pounds in November 2017; down to 124 on 11/20/2016; 123.6 today. History of chronic hepatitis C with documented eradication after a course of Harvoni. EGD recently suspicious but inconclusive for portal gastropathy. Other imaging and labs suggest no evidence of advanced chronic liver disease and preserved hepatic synthetic function.  Coming to see me today, he got out of his car in the parking lot became diaphoretic and weak and went down on one knee and then on to the parking lot surface on his rear end. His wife was holding onto him.  He was lowered to the ground. He did not take a hard fall or completely lose consciousness per wife. He almost immediately was able to get back on his feet and ambulated into the office with assistance.   Past Medical History:  Diagnosis Date  . Abdominal aortic aneurysm ([place])   . Abnormality of gait 10/03/2014  . Anxiety   . Arthritis   . BPH (benign prostatic hyperplasia)   . Carpal tunnel syndrome   . Cerebrovascular disease  10/03/2014  . Chronic back pain    chronic narcotic use  . Chronic hepatitis C ([place]) 1980   Harvoni 2016, cured. HCV RNA negative 01/2016  . Chronic pain   . Degenerative disc disease   . Depression   . Head trauma   . Hepatic cirrhosis ([place]) 02/19/2016  . Hypertension   . Hyperthyroidism   . Memory difficulties 10/03/2014  . Partial complex seizure disorder without intractable epilepsy ([place]) 10/03/2014  . Seizures ([place])    Epilepsy  . Stroke [facility]) 2012   left hemiplegia  . TIA (transient ischemic attack)   . Tremor   . Tremor, essential 10/02/2016  . UTI (lower urinary tract infection)     Past Surgical History:  Procedure Laterality Date  . EP IMPLANTABLE DEVICE N/A 11/15/2014   Procedure: Loop Recorder Insertion;  Surgeon: Thompson Grayer, MD;  Location: [place] CV LAB;  Service: Cardiovascular;  Laterality: N/A;  . ESOPHAGOGASTRODUODENOSCOPY (EGD) WITH PROPOFOL N/A 03/20/2016   Procedure: ESOPHAGOGASTRODUODENOSCOPY (EGD) WITH PROPOFOL;  Surgeon: Daneil Dolin, MD;  [facility];  Service: Endoscopy;  Laterality: N/A;  . HAND SURGERY     right  . NECK SURGERY    . right hand reconstruction      Prior to Admission medications   Medication Sig Start Date End Date Taking? Authorizing Provider  albuterol (PROVENTIL HFA;VENTOLIN HFA) 108 (90 Base) MCG/ACT inhaler Inhale 2 puffs into the lungs every 6 (six) hours as needed for wheezing or shortness of breath. 01/31/16  Yes Branch, Alphonse Guild, MD  ALPRAZolam (XANAX) 0.25 MG tablet Take 0.25 mg by mouth 3 (three) times daily as needed for anxiety.   Yes [provider]  clopidogrel (PLAVIX) 75 MG tablet Take 75 mg by mouth daily. Reported on 10/03/2015   Yes [provider]  divalproex (DEPAKOTE) 500 MG DR tablet Take 500 mg by mouth 2 (two) times daily.    Yes [provider]  docusate sodium (COLACE) 100 MG capsule Take 100 mg by mouth 2 (two) times daily.    Yes [provider]  DULoxetine  (CYMBALTA) 30 MG capsule Take 60 mg by mouth daily.  05/15/16 05/15/17 Yes [provider]  fluticasone furoate-vilanterol (BREO ELLIPTA) 100-25 MCG/INH AEPB Inhale 1 puff into the lungs daily.   Yes [provider]  hydrALAZINE (APRESOLINE) 25 MG tablet Take 25 mg by mouth 2 (two) times daily.   Yes [provider]  lisinopril (PRINIVIL,ZESTRIL) 40 MG tablet Take 40 mg by mouth daily.   Yes [provider]  methimazole (TAPAZOLE) 5 MG tablet Take 5 mg by mouth 3 (three) times daily.   Yes [provider]  Oxcarbazepine (TRILEPTAL) 300 MG tablet Take 300 mg by mouth 2 (two) times daily.   Yes [provider]  OxyCODONE ER (XTAMPZA ER) 18 MG C12A Take by mouth 2 (two) times daily.  02/06/17  Yes [provider]  Oxycodone HCl 20 MG TABS Take 20 mg by mouth. 02/06/17  Yes [provider]  sildenafil (VIAGRA) 50 MG tablet Take 25 mg by mouth daily as needed for erectile dysfunction.    Yes [provider]  Tamsulosin HCl (FLOMAX) 0.4 MG CAPS Take 0.4 mg by mouth daily.    Yes [provider]  tiotropium (SPIRIVA HANDIHALER) 18 MCG inhalation capsule Place 1 capsule (18 mcg total) into inhaler and inhale daily. 01/31/16  Yes BranchAlphonse Guild, MD  traZODone (DESYREL) 50 MG tablet Take 50 mg by mouth at bedtime.   Yes [provider]  XTAMPZA ER 18 MG C12A Take 1 tablet by mouth 2 (two) times daily. 11/07/16  Yes [provider]  oxyCODONE (ROXICODONE) 15 MG immediate release tablet Take 1 tablet by mouth 3 (three) times daily. 11/07/16   [provider]    Allergies as of 02/24/2017 - Review Complete 02/24/2017  Allergen Reaction Noted  . Keppra [levetiracetam] Other (See Comments) 10/03/2014  . Adhesive [tape]  11/15/2014  . Methadone  10/02/2016  . Morphine and related Nausea And Vomiting 05/01/2011    Family History  Problem Relation Age of Onset  . Cervical cancer Mother   .  Heart failure Father   . Lung cancer Sister   . Breast cancer Sister   . Colon cancer Neg Hx   . Colon polyps Neg Hx     Social History   Social History  . Marital status: Married    Spouse name: N/A  . Number of children: 2  . Years of education: GED   Occupational History  . unemployed Unemployed   Social History Main Topics  . Smoking status: Current Every Day Smoker    Packs/day: 1.00    Years: 45.00    Types: Cigarettes    Start date: 07/18/1962  . Smokeless tobacco: Never Used  . Alcohol use No     Comment: occasionally  . Drug use: No     Comment: prior cocaine/ marijuana  . Sexual activity: Yes    Partners: Female     Comment: hx multiple  sexual partners   Other Topics Concern  . Not on file   Social History Narrative   1 daughter committed suicide 2008   Patient right handed.   Patient drinks about 6-7 cups of caffeine daily.          Review of Systems: See HPI, otherwise negative ROS  Physical Exam: BP (!) 75/56   Pulse 94   Temp (!) 96.9 F (36.1 C) (Oral)   Ht 5\' 8"  (1.727 m)   Wt 123 lb 6.4 oz (56 kg)   BMI 18.76 kg/m   orthostatics: Lying 104/70, sitting 88/60, standing 76/58 pulse went from 68 to 84 General:   Somewhat disheveled, conversant pleasant and cooperative in NAD. Seen in the wheelchair. Neck:  Supple; no masses or thyromegaly. No significant cervical adenopathy. Lungs:  Clear throughout to auscultation.   No wheezes, crackles, or rhonchi. No acute distress. Heart:  Regular rate and rhythm; no murmurs, clicks, rubs,  or gallops. Abdomen: Non-distended, normal bowel sounds.  Soft and nontender without appreciable mass or hepatosplenomegaly.     Impression:  Pleasant 66 year old gentleman  has presented with failure to thrive weight loss which seems to have stabilized over the past 3 months comes to the office hypotensive and orthostatic. He had a controlled descent type of fall in the parking lot without apparent injury. He was  diaphoretic by his wife's report (she is an Therapist, sports) .  Baseline systolic blood pressure seems to be in the 120-130- range. Today's numbers deviate significantly from baseline. I suspect he experienced orthostatic presyncope in the parking lot and not a partial seizure.  He takes lisinopril and Apresoline at home.    Workup for weight loss unrevealing Which seems to have stabilized over the past 3 months) include a CTA of the abdomen which failed to demonstrate any evidence of significant mesenteric ischemia. Couple small pancreatic nodules on CT warrant repeat CT scanning in one year.  Speech pathology evaluation noted. Fecal DNA testing negative. Patient does not wish another attempt at performing an optical colonoscopy. This is not unreasonable.  Newly diagnosed AAA for which referral to the cardiothoracic surgeon has been made.  Recommendations:  Patient needs to go to the emergency department. I recommend he go via EMS. However,  he and his wife refused and stated she can get him back in the car and take him via private vehicle.  We have safely got him back into his private vehicle and alerted the ED that he will need help getting out upon their arrival.  As far as further GI recommendations, they are as follows:  We will see him back in 2 months  Maintain healthy diet; try Carnation instant breakfast twice daily  See physical therapist as recommended; keep appointment with Dr. Cyndia Bent.  Follow-up with neurologist  Repeat CT (pancreatic protocol) in 1 year  As discussed, we will not pursue a colonoscopy at patient request         Notice: This dictation was prepared with Dragon dictation along with smaller phrase technology. Any transcriptional errors that result from this process are unintentional and may not be corrected upon review.

## 2017-02-24 NOTE — Discharge Instructions (Signed)
Be careful in changing positions and have somebody with you when standing. Recheck with your primary care doctor in the next 1-2 days. Return if you are worse at any time especially chest pain, worsening weakness, or passing out

## 2017-02-24 NOTE — ED Triage Notes (Signed)
Pt was at dr. Roseanne Kaufman office for orthostatic hypotension. Pt was there for a regular appointment, when moving from a sitting to standing position pt became very weak and fell to the ground. Pt BP is 121/78 here. Denies any pain.

## 2017-02-24 NOTE — Patient Instructions (Addendum)
We will see you back in 2 months  Maintain healthy diet; try Carnation instant breakfast twice daily  See physical therapist as recommended; keep appointment with Dr. Cyndia Bent.  Follow-up with neurologist  Repeat pancreas CT in 1 year  As discussed, we will not pursue a colonoscopy at your request

## 2017-03-05 ENCOUNTER — Ambulatory Visit (INDEPENDENT_AMBULATORY_CARE_PROVIDER_SITE_OTHER): Payer: Medicare Other | Admitting: *Deleted

## 2017-03-05 DIAGNOSIS — I6310 Cerebral infarction due to embolism of unspecified precerebral artery: Secondary | ICD-10-CM

## 2017-03-06 DIAGNOSIS — G890 Central pain syndrome: Secondary | ICD-10-CM | POA: Diagnosis not present

## 2017-03-06 DIAGNOSIS — M25531 Pain in right wrist: Secondary | ICD-10-CM | POA: Diagnosis not present

## 2017-03-06 DIAGNOSIS — G894 Chronic pain syndrome: Secondary | ICD-10-CM | POA: Diagnosis not present

## 2017-03-06 NOTE — Progress Notes (Signed)
Carelink Summary Report / Loop Recorder 

## 2017-03-10 LAB — CUP PACEART REMOTE DEVICE CHECK
Date Time Interrogation Session: 20180906144211
Implantable Pulse Generator Implant Date: 20160518

## 2017-03-17 NOTE — Progress Notes (Signed)
Please change NIC. He has a 2 year repeat CT NIC that needs to be deleted. He needs NEW NIC for 11/2017, CT pancreatic protocol. DX: pancreatic lesions

## 2017-03-18 ENCOUNTER — Encounter: Payer: Self-pay | Admitting: Surgery

## 2017-04-01 ENCOUNTER — Encounter: Payer: Self-pay | Admitting: Surgery

## 2017-04-01 ENCOUNTER — Institutional Professional Consult (permissible substitution) (INDEPENDENT_AMBULATORY_CARE_PROVIDER_SITE_OTHER): Payer: Medicare Other | Admitting: Surgery

## 2017-04-01 VITALS — BP 120/86 | HR 88 | Ht 68.0 in | Wt 123.0 lb

## 2017-04-01 DIAGNOSIS — I712 Thoracic aortic aneurysm, without rupture, unspecified: Secondary | ICD-10-CM

## 2017-04-03 ENCOUNTER — Encounter: Payer: Self-pay | Admitting: Surgery

## 2017-04-03 NOTE — Progress Notes (Signed)
Cardiothoracic Surgery Consultation  PCP is Tawni Carnes, PA-C Referring Provider is Tawni Carnes, [place]  Chief Complaint  Patient presents with  . Distal thoracic aorta 3.9    HPI:  The patient is a 66 year old gentleman with hypertension, cerebrovascular disease s/p 7 strokes in the past with left sided weakness, a long history of heavy ongoing smoking, chronic pain due to degenerative arthritis on narcotics and multiple other problems as listed below. He had a CTA of the chest and abdomen in June/July looking for a cause of his recent failure to thrive with weight loss, poor appetite. The chest CTA showed mild dilation of the ascending aorta to 3.9 cm. There was also dilation of the aorta at the diaphragm level to 3.9 cm with a small penetrating ulcer in the anterior wall of the aorta just at the inferior margin of this aneurysm. There was some atherosclerosis of the aorta but no narrowing of the origin of the celiac or SMA to suggest mesenteric ischemia as a cause of his dwindles.  Past Medical History:  Diagnosis Date  . Abdominal aortic aneurysm ([place])   . Abnormality of gait 10/03/2014  . Anxiety   . Arthritis   . BPH (benign prostatic hyperplasia)   . Carpal tunnel syndrome   . Cerebrovascular disease 10/03/2014  . Chronic back pain    chronic narcotic use  . Chronic hepatitis C ([place]) 1980   Harvoni 2016, cured. HCV RNA negative 01/2016  . Chronic pain   . Degenerative disc disease   . Depression   . Head trauma   . Hepatic cirrhosis ([place]) 02/19/2016  . Hypertension   . Hyperthyroidism   . Memory difficulties 10/03/2014  . Partial complex seizure disorder without intractable epilepsy ([place]) 10/03/2014  . Seizures ([place])    Epilepsy  . Stroke [facility]) 2012   left hemiplegia  . TIA (transient ischemic attack)   . Tremor   . Tremor, essential 10/02/2016  . UTI (lower urinary tract infection)     Past Surgical History:  Procedure Laterality Date  . EP IMPLANTABLE DEVICE N/A  11/15/2014   Procedure: Loop Recorder Insertion;  Surgeon: Thompson Grayer, MD;  Location: [facility];  Service: Cardiovascular;  Laterality: N/A;  . ESOPHAGOGASTRODUODENOSCOPY (EGD) WITH PROPOFOL N/A 03/20/2016   Procedure: ESOPHAGOGASTRODUODENOSCOPY (EGD) WITH PROPOFOL;  Surgeon: Daneil Dolin, MD;  [facility];  Service: Endoscopy;  Laterality: N/A;  . HAND SURGERY     right  . NECK SURGERY    . right hand reconstruction      Family History  Problem Relation Age of Onset  . Cervical cancer Mother   . Heart failure Father   . Lung cancer Sister   . Breast cancer Sister   . Colon cancer Neg Hx   . Colon polyps Neg Hx     Social History Social History  Substance Use Topics  . Smoking status: Current Every Day Smoker    Packs/day: 1.00    Years: 45.00    Types: Cigarettes    Start date: 07/18/1962  . Smokeless tobacco: Never Used  . Alcohol use No     Comment: occasionally    Current Outpatient Prescriptions  Medication Sig Dispense Refill  . albuterol (PROVENTIL HFA;VENTOLIN HFA) 108 (90 Base) MCG/ACT inhaler Inhale 2 puffs into the lungs every 6 (six) hours as needed for wheezing or shortness of breath. 1 Inhaler 2  . ALPRAZolam (XANAX) 0.25 MG tablet Take 0.25 mg by mouth 3 (  three) times daily as needed for anxiety.    . ASPIRIN 81 PO Take by mouth daily.    . Cholecalciferol (VITAMIN D) 2000 units CAPS Take by mouth daily.    . clopidogrel (PLAVIX) 75 MG tablet Take 75 mg by mouth daily. Reported on 10/03/2015    . divalproex (DEPAKOTE) 500 MG DR tablet Take 250 mg by mouth 2 (two) times daily.     Marland Kitchen docusate sodium (COLACE) 100 MG capsule Take 100 mg by mouth 2 (two) times daily.     . DULoxetine (CYMBALTA) 60 MG capsule Take 60 mg by mouth daily.     . fluticasone furoate-vilanterol (BREO ELLIPTA) 100-25 MCG/INH AEPB Inhale 1 puff into the lungs 2 (two) times daily.     . Fluticasone-Umeclidin-Vilant (TRELEGY ELLIPTA) 100-62.5-25 MCG/INH AEPB Inhale 1  puff into the lungs daily.    . hydrALAZINE (APRESOLINE) 25 MG tablet Take 25 mg by mouth daily.     Marland Kitchen lisinopril (PRINIVIL,ZESTRIL) 40 MG tablet Take 40 mg by mouth daily.    . methimazole (TAPAZOLE) 5 MG tablet Take 5 mg by mouth every evening.     . Oxcarbazepine (TRILEPTAL) 300 MG tablet Take 300 mg by mouth 2 (two) times daily.    . OxyCODONE ER (XTAMPZA ER) 18 MG C12A Take 1 capsule by mouth 2 (two) times daily.     . Oxycodone HCl 20 MG TABS Take 1 tablet by mouth every 8 (eight) hours.     . sildenafil (VIAGRA) 50 MG tablet Take 25 mg by mouth daily as needed for erectile dysfunction.     . Tamsulosin HCl (FLOMAX) 0.4 MG CAPS Take 0.4 mg by mouth every evening.     . traZODone (DESYREL) 50 MG tablet Take 50 mg by mouth at bedtime.    Derrill Memo ON 04/05/2017] OxyCODONE ER 27 MG C12A Take by mouth every 12 (twelve) hours.     No current facility-administered medications for this visit.     Allergies  Allergen Reactions  . Keppra [Levetiracetam] Other (See Comments)    Suicidal ideation, anaphylaxsis  . Adhesive [Tape]     Severe rash  . Methadone     Delerium  . Morphine And Related Nausea And Vomiting    Can only take through IV.    Review of Systems  Constitutional: Positive for activity change, appetite change, fatigue and unexpected weight change.  HENT:       Edentulous  Eyes: Positive for visual disturbance.       Cataract. Wears glasses   Respiratory: Positive for apnea and wheezing.        Will not use his CPAP  Cardiovascular: Positive for chest pain. Negative for palpitations and leg swelling.  Gastrointestinal: Positive for blood in stool and constipation. Negative for abdominal pain.  Genitourinary:       Prostate enlargement  Musculoskeletal: Positive for arthralgias, back pain, gait problem and myalgias.  Skin: Negative.   Neurological: Positive for dizziness, seizures, numbness and headaches.  Hematological: Bruises/bleeds easily.    Psychiatric/Behavioral: Positive for dysphoric mood. The patient is nervous/anxious.     BP 120/86   Pulse 88   Ht 5\' 8"  (1.727 m)   Wt 123 lb (55.8 kg)   SpO2 94%   BMI 18.70 kg/m  Physical Exam  Constitutional: He is oriented to person, place, and time. No distress.  Thin, Chronically ill-appearing  HENT:  Head: Normocephalic and atraumatic.  Eyes: Pupils are equal, round, and reactive to light. EOM  are normal.  Neck: Normal range of motion. Neck supple. No JVD present. No thyromegaly present.  Cardiovascular: Normal rate, regular rhythm and normal heart sounds.   No murmur heard. Pulmonary/Chest: Effort normal and breath sounds normal. No respiratory distress. He has no wheezes.  Abdominal: Soft. Bowel sounds are normal. He exhibits no distension and no mass. There is no tenderness.  Musculoskeletal: He exhibits no edema.  Neurological: He is alert and oriented to person, place, and time.  Weakness left upper and lower extremity  Skin: Skin is warm and dry.  Psychiatric: He has a normal mood and affect.     Diagnostic Tests:  CT CHEST W CONTRAST (Accession [id number]) (Order [id number])  Imaging  Date: 12/09/2016 Department: [facility] Released By: Georgianne Fick Authorizing: Mahala Menghini, PA-C  Exam Information   Status Exam Begun  Exam Ended   Final [99] 12/09/2016 11:20 AM 12/09/2016 11:29 AM  PACS Images   Show images for CT CHEST W CONTRAST  Study Result   CLINICAL DATA:  Weakness with 30 pound weight loss in 1 year. Failure to thrive. Smoker.  EXAM: CT CHEST WITH CONTRAST  TECHNIQUE: Multidetector CT imaging of the chest was performed during intravenous contrast administration.  CONTRAST:  39mL ISOVUE-300 IOPAMIDOL (ISOVUE-300) INJECTION 61%  COMPARISON:  Radiographs 09/13/2015.  CT 07/26/2012.  FINDINGS: Cardiovascular: Diffuse atherosclerosis of the aorta, great vessels and coronary arteries. Mild dilatation of the ascending aorta  to 3.9 cm is similar to the previous study. There is also eccentric aneurysmal dilatation of the descending thoracic aorta just proximal to the diaphragm, not well demonstrated on prior noncontrast study. The aorta in this area has a maximal diameter of 3.9 cm. No evidence of mediastinal hematoma or dissection. The heart size is normal. There is no pericardial effusion.  Mediastinum/Nodes: There are no enlarged mediastinal, hilar or axillary lymph nodes. The thyroid gland, trachea and esophagus demonstrate no significant findings.  Lungs/Pleura: There is no pleural effusion. Moderate centrilobular emphysema. No suspicious pulmonary nodule, endobronchial lesion or confluent airspace opacity.  Upper abdomen: No acute findings are seen within the visualized upper abdomen. There is cortical scarring in the upper pole of the right kidney.  Musculoskeletal/Chest wall: There is no chest wall mass or suspicious osseous finding. Old rib fractures noted bilaterally. There is an implanted electronic device anteriorly in the left chest wall.  IMPRESSION: 1. No evidence of thoracic malignancy. 2. No definite acute findings. 3. Diffuse atherosclerosis with eccentric aneurysm of the distal descending thoracic aorta. Recommend followup by CTA in 1 year. This recommendation follows ACR consensus guidelines: White Paper of the ACR Incidental Findings Committee II on Vascular Findings. J Am Coll Radiol 2013; 10:789-794.  Emphysema (ICD10-J43.9). Aortic Atherosclerosis (ICD10-I70.0). Aortic aneurysm NOS (ICD10-I71.9).   Electronically Signed   By: Richardean Sale M.D.   On: 12/09/2016 14:17     CT ANGIO ABDOMEN PELVIS W &/OR WO CONTRAST (Accession [id number]) (Order [id number])  Imaging  Date: 01/12/2017 Department: [facility] Released By: Georgianne Fick Authorizing: Mahala Menghini, PA-C  Exam Information   Status Exam Begun  Exam Ended   Final [99] 01/12/2017 9:18 AM  01/12/2017 9:34 AM  PACS Images   Show images for CT ANGIO ABDOMEN PELVIS W &/OR WO CONTRAST  Study Result   CLINICAL DATA:  Weight loss and anorexia.  EXAM: CTA ABDOMEN AND PELVIS wITHOUT AND WITH CONTRAST  TECHNIQUE: Multidetector CT imaging of the abdomen and pelvis was performed using the  standard protocol during bolus administration of intravenous contrast. Multiplanar reconstructed images and MIPs were obtained and reviewed to evaluate the vascular anatomy.  CONTRAST:  100 cc Isovue 370  COMPARISON:  02/26/2016  FINDINGS: VASCULAR  Aorta: Maximal diameter of the distal thoracic aorta is 3.9 cm compared with 3.8 cm on the prior study. There is a small penetrating atherosclerotic ulcer in the anterior distal thoracic aorta. Smooth plaque and mild atherosclerotic calcifications are noted. The abdominal aorta is non aneurysmal and patent.  Celiac: Patent.  Branch vessels are patent.  SMA: There are atherosclerotic calcifications at the origin but no significant narrowing.  Renals: Single renal arteries are patent. Aneurysmal dilatation of the distal right renal artery measures 6 mm torsed the hilum of the right kidney.  IMA: Patent and diminutive.  Inflow: There are atherosclerotic calcifications any irregular plaque involving the bilateral common iliac artery is without significant focal narrowing. Right internal and external iliac arteries are patent. Left internal and external iliac arteries are patent. There is no evidence of iliac artery aneurysm.  Proximal Outflow: Grossly patent within the confines of the examination.  Veins: Hepatic, portal, splenic, superior mesenteric, and renal veins are patent. Iliac veins are non-opacified.  Review of the MIP images confirms the above findings.  NON-VASCULAR  Lower chest: Mild dependent atelectasis.  Hepatobiliary: Mild diffuse hepatic steatosis. Normal appearance of the  gallbladder.  Pancreas: Tiny hypodensities in the body of the pancreas on images 54 and 55 of series 5 are stable. No new pancreatic mass.  Spleen: Unremarkable  Adrenals/Urinary Tract: There is extensive scarring of the right kidney with lobulation. Tiny calculus in the upper pole of the right kidney. Left kidney is within normal limits. Adrenal glands are within normal limits. Bladder is unremarkable.  Stomach/Bowel: Stomach is grossly within normal limits. There is no evidence of small-bowel obstruction. There is prominent stool burden throughout the colon but no obvious mass in the colon.  Lymphatic: No abnormal retroperitoneal adenopathy.  Reproductive: Unremarkable prostate gland.  Other: Trace free-fluid in the left hemipelvis is nonspecific.  Musculoskeletal: No vertebral compression deformity.  IMPRESSION: VASCULAR  No significant narrowing of the visceral vasculature to result thin mesenteric arterial insufficiency.  Stable dilatation of the distal thoracic aorta at 3.9 cm.  NON-VASCULAR  Stable tiny low-density lesions in the body of the pancreas. Recommend follow up pre and post contrast MRI/MRCP or pancreatic protocol CT in 1 year. This recommendation follows ACR consensus guidelines: Management of Incidental Pancreatic Cysts: A White Paper of the ACR Incidental Findings Committee. [place] 1610;96:045-409.  Right nephrolithiasis and renal scarring.   Electronically Signed   By: Marybelle Killings M.D.   On: 01/12/2017 09:55    Impression:  This 66 year old gentleman has 3.9 cm fusiform ascending aortic dilation and a localized 3.9 cm fusiform aneurysm of the distal thoracic aorta at the diaphragmatic level with a small penetrating ulcer. He had a CT of the chest and abdomen in 06/2012 and these two areas are unchanged from that CT scan. I don't think these aortic abnormalities have anything to do with his current failure to thrive.   They do not require surgical treatment at this time. They should be followed up in a year. I discussed the importance of smoking cessation and good blood pressure control. He says that he has no interest in quitting smoking. I reviewed the CT images with the patient and his wife and answered their questions.  Plan:  I will see him  back in one year with a CTA of the chest and abdomen.   I spent 30 minutes performing this consultation and > 50% of this time was spent face to face counseling and coordinating the care of this patient's aortic aneurysm and penetrating descending thoracic   aortic ulcer. Gaye Pollack, MD [facility] [phone]

## 2017-04-06 ENCOUNTER — Ambulatory Visit (INDEPENDENT_AMBULATORY_CARE_PROVIDER_SITE_OTHER): Payer: Medicare Other | Admitting: *Deleted

## 2017-04-06 ENCOUNTER — Ambulatory Visit: Payer: Medicare Other | Admitting: Adult Health

## 2017-04-06 DIAGNOSIS — I6310 Cerebral infarction due to embolism of unspecified precerebral artery: Secondary | ICD-10-CM

## 2017-04-07 ENCOUNTER — Encounter: Payer: Self-pay | Admitting: Internal Medicine

## 2017-04-07 ENCOUNTER — Ambulatory Visit (INDEPENDENT_AMBULATORY_CARE_PROVIDER_SITE_OTHER): Payer: Medicare Other | Admitting: Internal Medicine

## 2017-04-07 VITALS — BP 118/83 | HR 103 | Temp 97.4°F | Ht 68.0 in | Wt 124.0 lb

## 2017-04-07 DIAGNOSIS — R634 Abnormal weight loss: Secondary | ICD-10-CM

## 2017-04-07 DIAGNOSIS — K7469 Other cirrhosis of liver: Secondary | ICD-10-CM

## 2017-04-07 MED ORDER — DRONABINOL 2.5 MG PO CAPS: 2.5000 mg | ORAL_CAPSULE | Freq: Two times a day (BID) | ORAL | 3 refills | Status: DC

## 2017-04-07 NOTE — Progress Notes (Signed)
Primary Care Physician:  Tawni Carnes, PA-C Primary Gastroenterologist:  Dr. Gala Romney  Pre-Procedure History & Physical: HPI:  Eric Tran is a 66 y.o. male here for follow-up of failure to thrive.  Extensive evaluation of unintentional weight loss, failure to thrive.. Patient has multiple comorbidities. Is on multiple medications. Recently discovered infrarenal aortic aneurysm is being followed by cardiovascular surgery. He has been extensively evaluated with CT chest through pelvis failed to demonstrate a cause for weight loss and chronic generalized pain. No evidence for mesenteric ischemia.  Patient fell coming into our  office at last visit. He went to the ED and was evaluated by Dr. Jeanell Sparrow. Felt to have no significant issues. Wife states he has fallen twice since that time and had presyncopal symptoms. He is refused to seek out further evaluation.  His weight is holding steady at 124 pounds today. He denies abdominal pain each 1-2 meals daily. Denies bleeding bowels are moving regularly.  History of HCV-related cirrhosis-status post eradication of HCV.   Past Medical History:  Diagnosis Date  . Abdominal aortic aneurysm ([place])   . Abnormality of gait 10/03/2014  . Anxiety   . Arthritis   . BPH (benign prostatic hyperplasia)   . Carpal tunnel syndrome   . Cerebrovascular disease 10/03/2014  . Chronic back pain    chronic narcotic use  . Chronic hepatitis C ([place]) 1980   Harvoni 2016, cured. HCV RNA negative 01/2016  . Chronic pain   . Degenerative disc disease   . Depression   . Head trauma   . Hepatic cirrhosis ([place]) 02/19/2016  . Hypertension   . Hyperthyroidism   . Memory difficulties 10/03/2014  . Partial complex seizure disorder without intractable epilepsy ([place]) 10/03/2014  . Seizures ([place])    Epilepsy  . Stroke [facility]) 2012   left hemiplegia  . TIA (transient ischemic attack)   . Tremor   . Tremor, essential 10/02/2016  . UTI (lower urinary tract infection)     Past  Surgical History:  Procedure Laterality Date  . EP IMPLANTABLE DEVICE N/A 11/15/2014   Procedure: Loop Recorder Insertion;  Surgeon: Thompson Grayer, MD;  Location: [facility];  Service: Cardiovascular;  Laterality: N/A;  . ESOPHAGOGASTRODUODENOSCOPY (EGD) WITH PROPOFOL N/A 03/20/2016   Procedure: ESOPHAGOGASTRODUODENOSCOPY (EGD) WITH PROPOFOL;  Surgeon: Daneil Dolin, MD;  [facility];  Service: Endoscopy;  Laterality: N/A;  . HAND SURGERY     right  . NECK SURGERY    . right hand reconstruction      Prior to Admission medications   Medication Sig Start Date End Date Taking? Authorizing Provider  albuterol (PROVENTIL HFA;VENTOLIN HFA) 108 (90 Base) MCG/ACT inhaler Inhale 2 puffs into the lungs every 6 (six) hours as needed for wheezing or shortness of breath. 01/31/16  Yes Branch, Alphonse Guild, MD  ALPRAZolam Duanne Moron) 0.25 MG tablet Take 0.25 mg by mouth 3 (three) times daily as needed for anxiety.   Yes [provider]  ASPIRIN 81 PO Take by mouth daily.   Yes [provider]  Cholecalciferol (VITAMIN D) 2000 units CAPS Take by mouth daily.   Yes [provider]  clopidogrel (PLAVIX) 75 MG tablet Take 75 mg by mouth daily. Reported on 10/03/2015   Yes [provider]  divalproex (DEPAKOTE) 500 MG DR tablet Take 250 mg by mouth 2 (two) times daily.    Yes [provider]  docusate sodium (COLACE) 100 MG capsule Take 100 mg by mouth 2 (  two) times daily.    Yes [provider]  DULoxetine (CYMBALTA) 60 MG capsule Take 60 mg by mouth daily.  05/15/16 05/15/17 Yes [provider]  fluticasone furoate-vilanterol (BREO ELLIPTA) 100-25 MCG/INH AEPB Inhale 1 puff into the lungs 2 (two) times daily.    Yes [provider]  Fluticasone-Umeclidin-Vilant (TRELEGY ELLIPTA) 100-62.5-25 MCG/INH AEPB Inhale 1 puff into the lungs daily.   Yes [provider]  hydrALAZINE (APRESOLINE) 25 MG tablet Take 25 mg by mouth 3  (three) times daily.    Yes [provider]  lisinopril (PRINIVIL,ZESTRIL) 40 MG tablet Take 40 mg by mouth daily.   Yes [provider]  methimazole (TAPAZOLE) 5 MG tablet Take 5 mg by mouth every evening.    Yes [provider]  Oxcarbazepine (TRILEPTAL) 300 MG tablet Take 300 mg by mouth 2 (two) times daily.   Yes [provider]  OxyCODONE ER 27 MG C12A Take by mouth every 12 (twelve) hours. 04/05/17 05/05/17 Yes [provider]  Oxycodone HCl 20 MG TABS Take 1 tablet by mouth every 8 (eight) hours as needed.  11/07/16  Yes [provider]  sildenafil (VIAGRA) 50 MG tablet Take 25 mg by mouth daily as needed for erectile dysfunction.    Yes [provider]  Tamsulosin HCl (FLOMAX) 0.4 MG CAPS Take 0.4 mg by mouth every evening.    Yes [provider]  traZODone (DESYREL) 50 MG tablet Take 50 mg by mouth at bedtime.   Yes [provider]    Allergies as of 04/07/2017 - Review Complete 04/07/2017  Allergen Reaction Noted  . Keppra [levetiracetam] Other (See Comments) 10/03/2014  . Adhesive [tape]  11/15/2014  . Methadone  10/02/2016  . Morphine and related Nausea And Vomiting 05/01/2011    Family History  Problem Relation Age of Onset  . Cervical cancer Mother   . Heart failure Father   . Lung cancer Sister   . Breast cancer Sister   . Colon cancer Neg Hx   . Colon polyps Neg Hx     Social History   Social History  . Marital status: Married    Spouse name: N/A  . Number of children: 2  . Years of education: GED   Occupational History  . unemployed Unemployed   Social History Main Topics  . Smoking status: Current Every Day Smoker    Packs/day: 1.00    Years: 45.00    Types: Cigarettes    Start date: 07/18/1962  . Smokeless tobacco: Never Used  . Alcohol use No     Comment: occasionally  . Drug use: No     Comment: prior cocaine/ marijuana  . Sexual activity: Yes    Partners: Female      Comment: hx multiple sexual partners   Other Topics Concern  . Not on file   Social History Narrative   1 daughter committed suicide 2008   Patient right handed.   Patient drinks about 6-7 cups of caffeine daily.          Review of Systems: See HPI, otherwise negative ROS  Physical Exam: BP 118/83   Pulse (!) 103   Temp (!) 97.4 F (36.3 C) (Oral)   Ht 5\' 8"  (1.727 m)   Wt 124 lb (56.2 kg)   BMI 18.85 kg/m  General:   Chronically ill, thin gentleman otherwise alert and conversing pleasantly comes by his Skin:  Intact without significant lesions or rashes. Heart:  Regular rate  and rhythm; no murmurs, clicks, rubs,  or gallops. Abdomen: Non-distended, normal bowel sounds.  Soft and nontender without appreciable mass or hepatosplenomegaly.  Pulses:  Normal pulses noted. Extremities:  Without clubbing or edema.  Impression:  HCV cirrhosis-well compensated status post eradication of HCV. Multiple comorbidities and polypharmacy.  Unsure what to make of his recent falls. Weight holding steady. He is still not thriving very well. Some discussion today and previously about trying an appetite stimulant.  He has requested pain medications today.   Recommendations: Continue present course of management  Trial of Marinol 2.5 mg twice daily; disp 60 with 3 refills  Continued hepatoma screening with hepatic ultrasounds every 6 months.  I have declined to give him any prescription for pain.  Office visit in 3 months   Notice: This dictation was prepared with Dragon dictation along with smaller phrase technology. Any transcriptional errors that result from this process are unintentional and may not be corrected upon review.

## 2017-04-07 NOTE — Patient Instructions (Signed)
Continue present course of management  Trial of Marinol 2.5 mg twice daily; disp 60 with 3 refills  Office visit in 3 months

## 2017-04-07 NOTE — Progress Notes (Signed)
Carelink Summary Report / Loop Recorder 

## 2017-04-09 LAB — CUP PACEART REMOTE DEVICE CHECK
Date Time Interrogation Session: 20181006154412
Implantable Pulse Generator Implant Date: 20160518

## 2017-04-15 DIAGNOSIS — Z720 Tobacco use: Secondary | ICD-10-CM | POA: Diagnosis not present

## 2017-04-15 DIAGNOSIS — Z87820 Personal history of traumatic brain injury: Secondary | ICD-10-CM | POA: Diagnosis not present

## 2017-04-15 DIAGNOSIS — R319 Hematuria, unspecified: Secondary | ICD-10-CM | POA: Diagnosis not present

## 2017-04-21 ENCOUNTER — Telehealth: Payer: Self-pay | Admitting: *Deleted

## 2017-04-21 NOTE — Telephone Encounter (Signed)
Pt had recent visit with psychiatrist who wants to increase Cymbalta to 90 mg daily but suggested pt get Dr Nelly Laurence recs on this before increasing

## 2017-04-22 NOTE — Telephone Encounter (Signed)
Dose change is ok with me  [name] MD

## 2017-04-22 NOTE — Telephone Encounter (Signed)
Pt wife made aware - update medication list

## 2017-05-04 ENCOUNTER — Ambulatory Visit (INDEPENDENT_AMBULATORY_CARE_PROVIDER_SITE_OTHER): Payer: Medicare Other | Admitting: *Deleted

## 2017-05-04 DIAGNOSIS — I6310 Cerebral infarction due to embolism of unspecified precerebral artery: Secondary | ICD-10-CM

## 2017-05-05 LAB — CUP PACEART REMOTE DEVICE CHECK
Date Time Interrogation Session: 20181105164048
Implantable Pulse Generator Implant Date: 20160518

## 2017-05-05 NOTE — Progress Notes (Signed)
Carelink Summary Report / Loop Recorder 

## 2017-05-06 DIAGNOSIS — M25531 Pain in right wrist: Secondary | ICD-10-CM | POA: Diagnosis not present

## 2017-05-06 DIAGNOSIS — G890 Central pain syndrome: Secondary | ICD-10-CM | POA: Diagnosis not present

## 2017-05-06 DIAGNOSIS — G894 Chronic pain syndrome: Secondary | ICD-10-CM | POA: Diagnosis not present

## 2017-05-19 ENCOUNTER — Telehealth: Payer: Self-pay | Admitting: Internal Medicine

## 2017-05-19 NOTE — Telephone Encounter (Signed)
Spoke with pts spouse and pt isn't able to receive his medication due to PA needs to be started and approved.   [place] and they wouldn't fax info needed for PA but gave a number [phone] [facility]. Will contact [employer]

## 2017-05-19 NOTE — Telephone Encounter (Signed)
[phone] PATIENT WIFE CALLED AND STATED THAT THE PHARMACY WOULD NOT FILL THE PATIENTS MARINOL, HIS INSURANCE NEEDS A PRIOR AUTH.  THEY SUGGESTED SOMETHING ELSE AT THE PHARMACY BUT THIS WORKS FOR THE PATIENT

## 2017-05-20 NOTE — Telephone Encounter (Signed)
Faxed PA, waiting on an approval or denial letter.

## 2017-05-25 ENCOUNTER — Telehealth: Payer: Self-pay

## 2017-05-25 NOTE — Telephone Encounter (Signed)
PA was denied for Dronabiol 2.5mg . Please advise on if you would like to change medication.

## 2017-05-25 NOTE — Telephone Encounter (Signed)
Spoke with pts spouse notifying her that pts medication Dronabinol 2.5mg  had been denied.  PTs spouse called her insurance company and they said the med is usually given to Aids or cancer pts so that's why they denied the drug but an appeal can be filed.    Prior message has been sent to [facility] to discuss medication.

## 2017-05-27 NOTE — Telephone Encounter (Signed)
So, this prescription was recommended almost 2 months ago.  He should have an office visit with an extender in about 3-4 weeks from now. Let's just sit tight and see how he is doing when he comes back.

## 2017-05-29 NOTE — Telephone Encounter (Signed)
Patients spouse spoke with her insurance company and she wants to do an appeal . Pt said their insurance company said the medication would be approved if we appeal.

## 2017-05-29 NOTE — Telephone Encounter (Signed)
Keep follow-up appointment. We can decide about appealing that time.

## 2017-05-29 NOTE — Telephone Encounter (Signed)
Spoke with pts spouse and they are going to purchase the prescribed medication out of pocket. They will give an update on how pt is doing while on med and f/u at pts next appointment.

## 2017-06-02 NOTE — Telephone Encounter (Signed)
PATIENT SCHEDULED AND CALLED WITH DATE AND TIME

## 2017-06-02 NOTE — Telephone Encounter (Signed)
Received a fax from the pharmacy that the dronabinol needed a PA. This was already denied by his insurance. Lets make sure the pt has an ov with extender. See RMR recommendations below.

## 2017-06-02 NOTE — Telephone Encounter (Signed)
Noted  

## 2017-06-03 ENCOUNTER — Ambulatory Visit (INDEPENDENT_AMBULATORY_CARE_PROVIDER_SITE_OTHER): Payer: Medicare Other | Admitting: *Deleted

## 2017-06-03 DIAGNOSIS — I6310 Cerebral infarction due to embolism of unspecified precerebral artery: Secondary | ICD-10-CM | POA: Diagnosis not present

## 2017-06-04 NOTE — Progress Notes (Signed)
Carelink Summary Report / Loop Recorder 

## 2017-06-13 LAB — CUP PACEART REMOTE DEVICE CHECK
Date Time Interrogation Session: 20181205164211
Implantable Pulse Generator Implant Date: 20160518

## 2017-06-24 IMAGING — CT CT ABD-PELV W/ CM
2 of 5 series · 16 of 46 positions shown, 18 images · IV contrast (iopamidol)
Comparison: 01/12/2014 CT abdomen and pelvis

CLINICAL DATA: 65 y/o M; 16 pound weight loss in 2-3 weeks, melena,
and cirrhosis.

EXAM:
CT ABDOMEN AND PELVIS WITH CONTRAST
TECHNIQUE: Multidetector CT imaging of the abdomen and pelvis was performed
using the standard protocol following bolus administration of
intravenous contrast.
CONTRAST:  100mL [id number] IOPAMIDOL ([id number]-6OO) INJECTION 61%

[Series 2: routine abd pel with · axial · 0.64mm/px · z∈[-458,-62]mm · 13 of 89 slices shown, 15 images]
[im 5/89  soft-tissue]
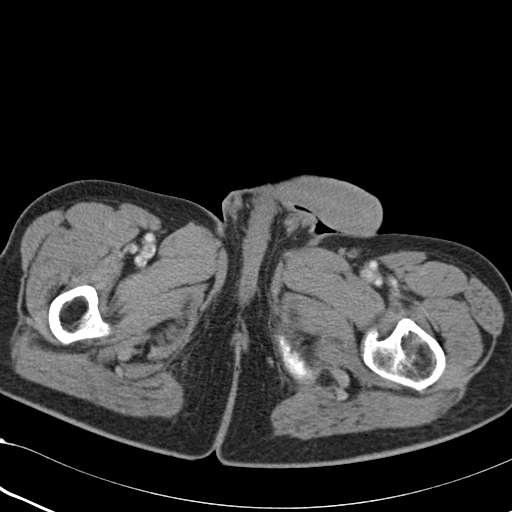
[im 5/89  bone]
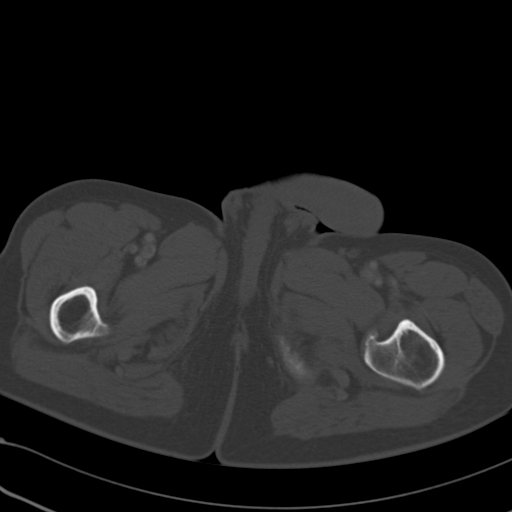
[im 10/89  soft-tissue]
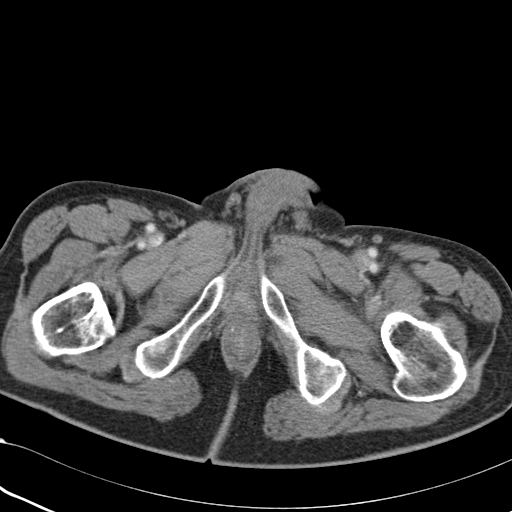
[im 20/89  soft-tissue]
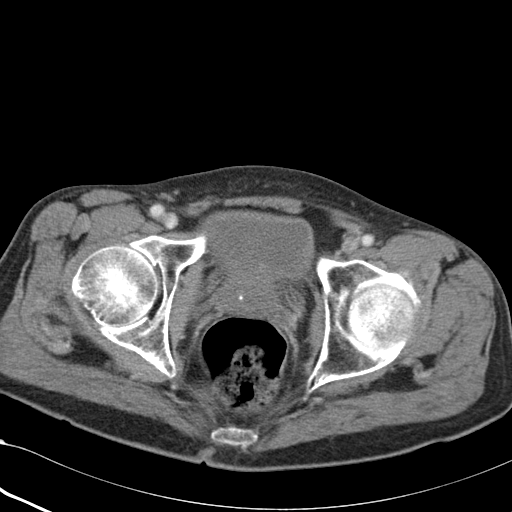
[im 25/89  soft-tissue]
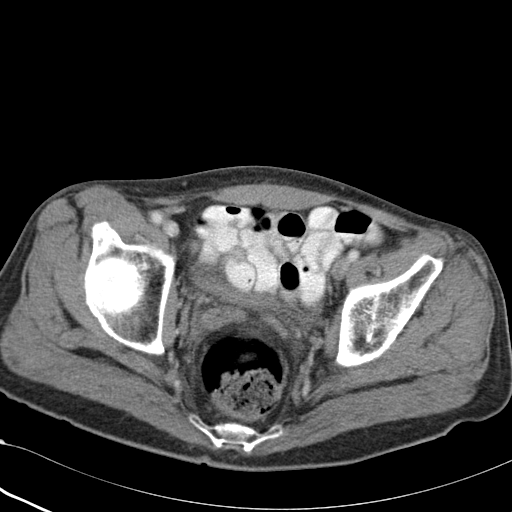
[im 30/89  soft-tissue]
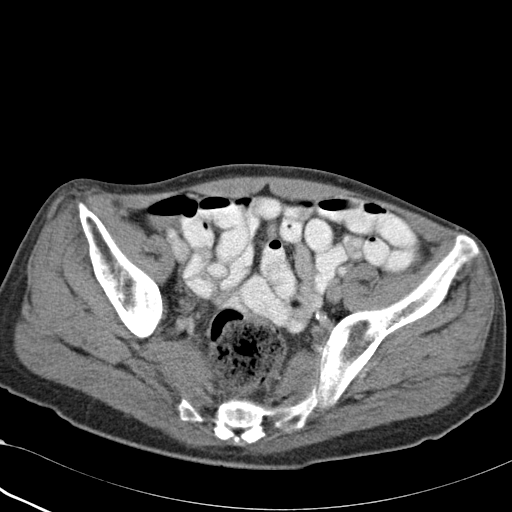
[im 40/89  soft-tissue]
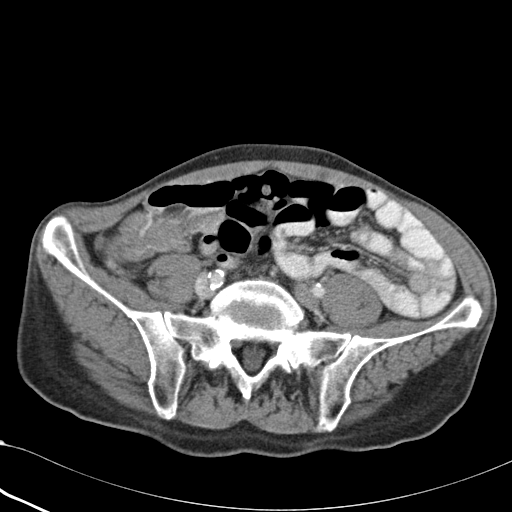
[im 45/89  soft-tissue]
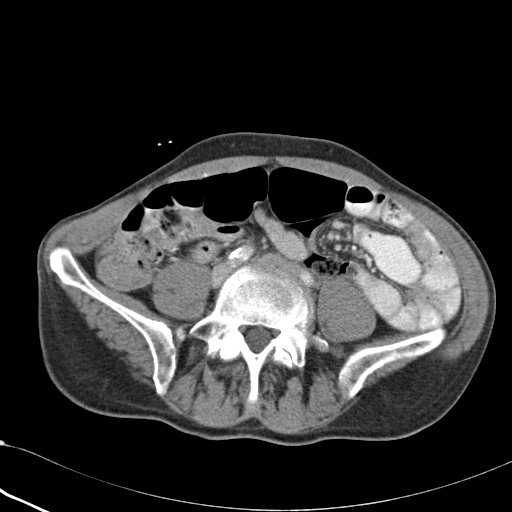
[im 49/89  soft-tissue]
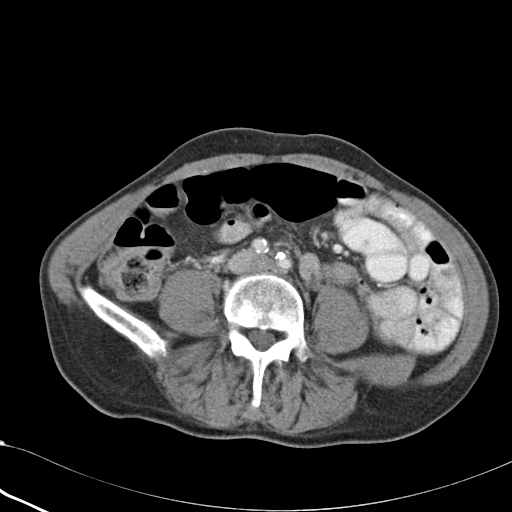
[im 59/89  soft-tissue]
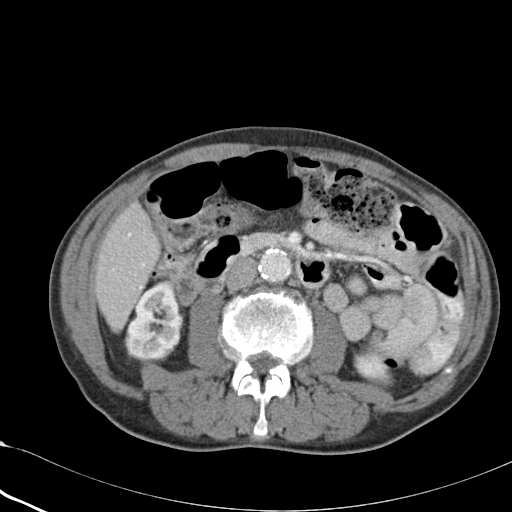
[im 59/89  bone]
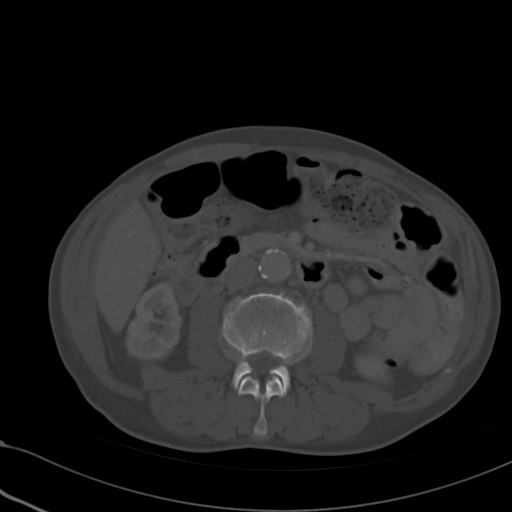
[im 64/89  soft-tissue]
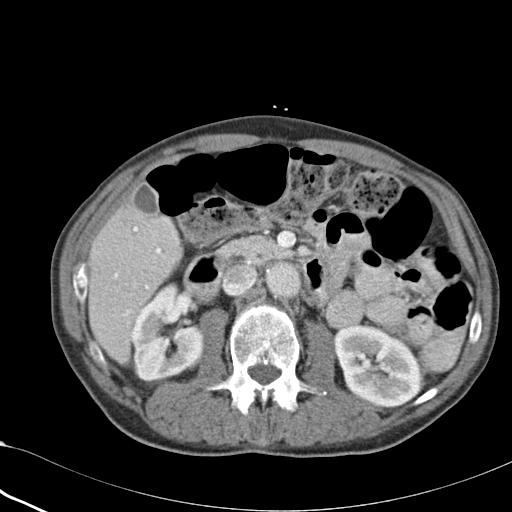
[im 69/89  soft-tissue]
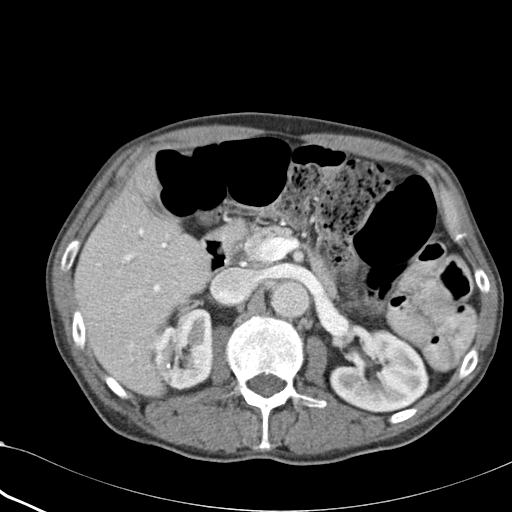
[im 79/89  soft-tissue]
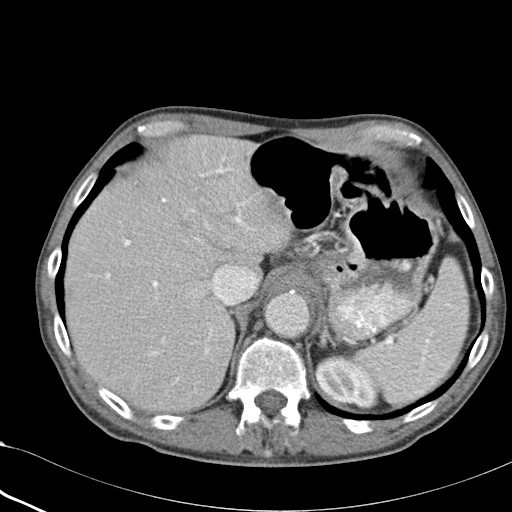
[im 84/89  soft-tissue]
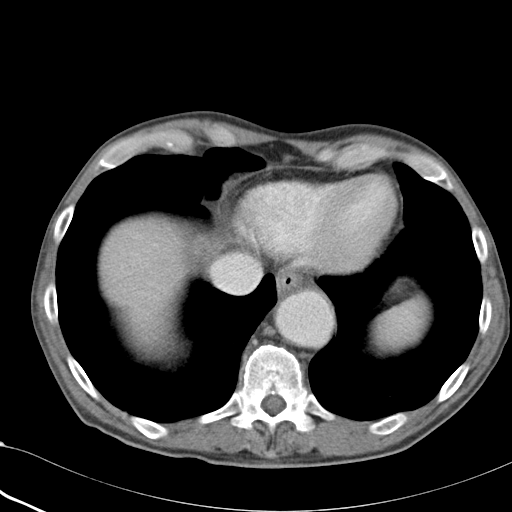

[Series 4: coronal · coronal · 0.66mm/px · 3 of 113 slices shown]
[im 38/113  soft-tissue]
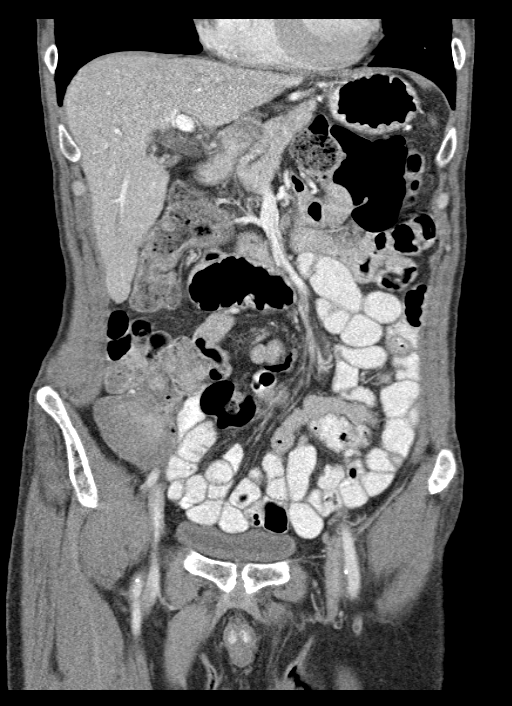
[im 50/113  soft-tissue]
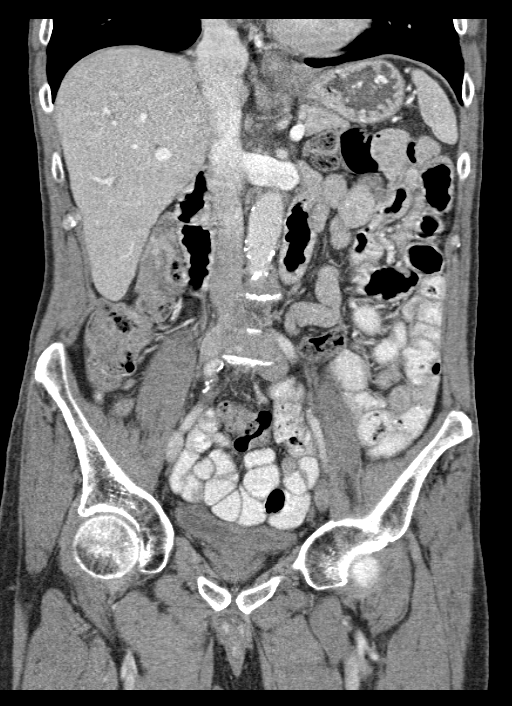
[im 63/113  soft-tissue]
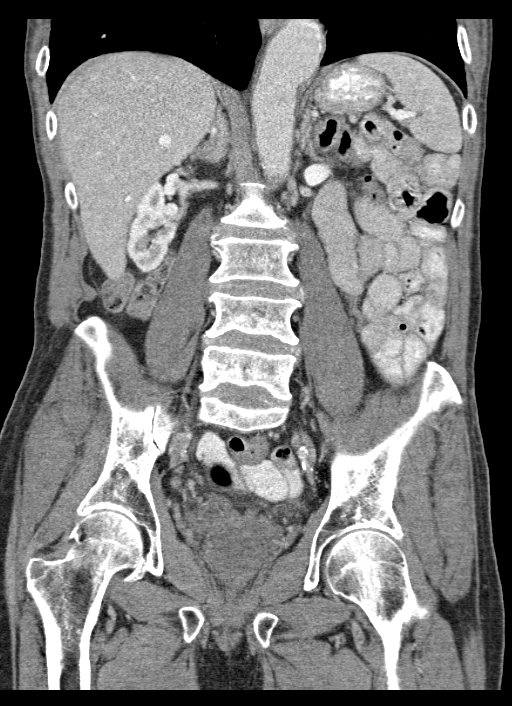

[16 of 46 positions shown; findings below may reference images not displayed]

FINDINGS: Lower chest: Minor linear atelectasis in the right lung base. Stable
distal thoracic aorta saccular aneurysm with 16 mm depth, total 36
mm.

Hepatobiliary: No masses or other significant abnormality.

Pancreas: No mass or inflammatory changes. 2-3 mm lucencies in the
pancreatic body stable from prior study may represent small cysts,
possibly side branch IPMN (series 4, image 38).

Spleen: Within normal limits in size and appearance.

Adrenals/Urinary Tract: Normal adrenal glands. Right kidney upper
pole punctate nonobstructing stones. Multiple areas of cortical
scarring of the right kidney.

Stomach/Bowel: No evidence of obstruction, inflammatory process, or
abnormal fluid collections. Normal appendix.

Vascular/Lymphatic: No pathologically enlarged lymph nodes. No
evidence of abdominal aortic aneurysm.

Reproductive: No mass or other significant abnormality.

Other: None.

Musculoskeletal: Mild multilevel degenerative changes with disc
space narrowing, marginal osteophytes, and lumbar disc bulges and
facet arthropathy.
IMPRESSION: 1. No acute abnormality is identified. No discrete mass is
identified
2. Stable 3.6 cm distal thoracic aortic aneurysm with 16 mm
sacculation.
3. Stable 2-3 mm pancreatic body cysts. Consider reimaging with
MRI/MRCP at 2 years to evaluate for stability.
4. Right kidney nonobstructing stones measuring up to 3 mm.

By: Relindas Auad M.D.

## 2017-06-25 ENCOUNTER — Encounter: Payer: Self-pay | Admitting: Gastroenterology

## 2017-06-25 ENCOUNTER — Ambulatory Visit (INDEPENDENT_AMBULATORY_CARE_PROVIDER_SITE_OTHER): Payer: Medicare Other | Admitting: Gastroenterology

## 2017-06-25 VITALS — BP 141/94 | HR 116 | Temp 97.4°F | Ht 68.0 in | Wt 127.8 lb

## 2017-06-25 DIAGNOSIS — K5900 Constipation, unspecified: Secondary | ICD-10-CM

## 2017-06-25 DIAGNOSIS — K7460 Unspecified cirrhosis of liver: Secondary | ICD-10-CM | POA: Diagnosis not present

## 2017-06-25 DIAGNOSIS — R634 Abnormal weight loss: Secondary | ICD-10-CM

## 2017-06-25 MED ORDER — POLYETHYLENE GLYCOL 3350 17 GM/SCOOP PO POWD: ORAL | 5 refills | Status: AC

## 2017-06-25 NOTE — Assessment & Plan Note (Signed)
Due for hepatoma surveillance and labs. EGD planned for 02/2018 for esophageal variceal screening.

## 2017-06-25 NOTE — Assessment & Plan Note (Signed)
Add miralax one capful daily as needed.

## 2017-06-25 NOTE — Patient Instructions (Signed)
1. Please have labs and ultrasound done.  2. Start miralax once daily as needed for constipation.  3. Return for follow up in 3 months or call sooner if needed.

## 2017-06-25 NOTE — Assessment & Plan Note (Signed)
Likely multifactorial in setting of multiple strokes, depression?, emphysema (confirmed earlier this year), medications. Appetite has improved on megace and he has gained three pounds; therefore, will hold off on appeals letter for marinol at this point. Continue to encourage oral intake. Follow up with Dr. [name] regarding concerns for need for oxygen. Monitor weight closely at home. Return for follow up in 3 months.

## 2017-06-25 NOTE — Progress Notes (Signed)
Primary Care Physician: Tawni Carnes, PA-C  Primary Gastroenterologist:  Garfield Cornea, MD   Chief Complaint  Patient presents with  . Weight Loss    f/u. Appetite is improving.     HPI: Eric Tran is a 66 y.o. male here for follow up. He was last seen in October by Dr. Gala Romney.  History of hep C cirrhosis, hepatitis C was eradicated.  History of failure to thrive, unintentional weight loss with extensive evaluation in the past. H/o of multiple strokes.   Patient completed cologuard testing 05/2016 (because of poor prep twice and no desire to attempt prep again) which was negative.  Plan is to repeat Cologuard November 2020.    Chest CT in June 2018 to further investigate his unintentional weight loss showed moderate centrilobular emphysema but no malignancy.  He is being followed for thoracic aortic aneurysm.  Following with Dr. [name] regarding COPD.  Patient's wife states that he has labored breathing fairly frequently and she wonders if he needs oxygen at home.  Follow-up with Dr. [name] planned.CTA of the abdomen demonstrated no significant mesenteric stenosis. No occult pulmonary lesion. 2 small pancreatic lesions for which a one-year follow-up CT is been recommended.   Dr. Gala Romney prescribe Marinol 2.5 mg twice daily at October 2019 office visit.  Unfortunately insurance would not pay for it was gone constipation over $300.  In the interim he received Megace through the [place], dose and wife to call back with dosage.  He is actually gained about 3 pounds since then with some improvement in his appetite.  He denies postprandial abdominal pain.  He does not have any abdominal pain.  All of his pain is left-sided related to his stroke.  No nausea or vomiting.  Appetite comes and goes, mostly good days per wife.  Bowel movements suboptimal, [place] 1 stools.  Passes some stool every day.  No melena or rectal bleeding.  He denies heartburn or dysphagia.     Current Outpatient  Medications  Medication Sig Dispense Refill  . albuterol (PROVENTIL HFA;VENTOLIN HFA) 108 (90 Base) MCG/ACT inhaler Inhale 2 puffs into the lungs every 6 (six) hours as needed for wheezing or shortness of breath. 1 Inhaler 2  . ALPRAZolam (XANAX) 0.25 MG tablet Take 0.25 mg by mouth 3 (three) times daily as needed for anxiety.    . ASPIRIN 81 PO Take by mouth daily.    . Cholecalciferol (VITAMIN D) 2000 units CAPS Take by mouth daily.    . clopidogrel (PLAVIX) 75 MG tablet Take 75 mg by mouth daily. Reported on 10/03/2015    . divalproex (DEPAKOTE) 500 MG DR tablet Take 250 mg by mouth 2 (two) times daily.     Marland Kitchen docusate sodium (COLACE) 100 MG capsule Take 100 mg by mouth 2 (two) times daily.     . DULoxetine (CYMBALTA) 60 MG capsule Take 90 mg by mouth daily.     . fluticasone furoate-vilanterol (BREO ELLIPTA) 100-25 MCG/INH AEPB Inhale 1 puff into the lungs 2 (two) times daily.     . Fluticasone-Umeclidin-Vilant (TRELEGY ELLIPTA) 100-62.5-25 MCG/INH AEPB Inhale 1 puff into the lungs daily.    . hydrALAZINE (APRESOLINE) 25 MG tablet Take 25 mg by mouth 2 (two) times daily.     Marland Kitchen lisinopril (PRINIVIL,ZESTRIL) 40 MG tablet Take 40 mg by mouth daily.    . Megestrol Acetate (MEGACE ORAL PO) Take 1 tablet by mouth daily.    . methimazole (TAPAZOLE) 5 MG tablet Take 5  mg by mouth every evening.     . Oxcarbazepine (TRILEPTAL) 300 MG tablet Take 300 mg by mouth 2 (two) times daily.    . Oxycodone HCl 20 MG TABS Take 1 tablet by mouth every 6 (six) hours.     . sildenafil (VIAGRA) 50 MG tablet Take 25 mg by mouth daily as needed for erectile dysfunction.     . Tamsulosin HCl (FLOMAX) 0.4 MG CAPS Take 0.4 mg by mouth every evening.     . traZODone (DESYREL) 50 MG tablet Take 50 mg by mouth at bedtime.     No current facility-administered medications for this visit.     Allergies as of 06/25/2017 - Review Complete 06/25/2017  Allergen Reaction Noted  . Keppra [levetiracetam] Other (See Comments)  10/03/2014  . Adhesive [tape]  11/15/2014  . Methadone  10/02/2016  . Morphine and related Nausea And Vomiting 05/01/2011    ROS:  General: Negative for fever, chills, fatigue, ++weakness.  See HPI ENT: Negative for hoarseness, difficulty swallowing , nasal congestion. CV: Negative for chest pain, angina, palpitations, dyspnea on exertion, peripheral edema.  Respiratory: Negative for dyspnea at rest, dyspnea on exertion, cough, sputum, wheezing.  GI: See history of present illness. GU:  Negative for dysuria, hematuria, urinary incontinence, urinary frequency, nocturnal urination.  Endo: Negative for unusual weight change.    Physical Examination:   BP (!) 141/94   Pulse (!) 116   Temp (!) 97.4 F (36.3 C) (Oral)   Ht 5\' 8"  (1.727 m)   Wt 127 lb 12.8 oz (58 kg)   BMI 19.43 kg/m   General: Thin white male, no acute distress appears older than stated age Eyes: No icterus. Mouth: Oropharyngeal mucosa moist and pink , no lesions erythema or exudate. Lungs: Clear to auscultation bilaterally.  Heart:slight tachycardia after ambulating.  Regular rate and rhythm, no murmurs rubs or gallops.  Abdomen: Bowel sounds are normal, nontender, nondistended, no hepatosplenomegaly or masses, no abdominal bruits or hernia , no rebound or guarding.   Extremities: No lower extremity edema. No clubbing or deformities. Neuro: Alert and oriented x 4   Skin: Warm and dry, no jaundice.   Psych: Alert and cooperative, normal mood and affect.  Labs:  Lab Results  Component Value Date   CREATININE 1.08 02/24/2017   BUN 21 (H) 02/24/2017   NA 139 02/24/2017   K 3.4 (L) 02/24/2017   CL 103 02/24/2017   CO2 28 02/24/2017   Lab Results  Component Value Date   ALT 8 (L) 02/24/2017   AST 16 02/24/2017   ALKPHOS 40 02/24/2017   BILITOT 0.6 02/24/2017   Lab Results  Component Value Date   WBC 5.9 02/24/2017   HGB 14.6 02/24/2017   HCT 41.4 02/24/2017   MCV 90.8 02/24/2017   PLT 180 02/24/2017        Imaging Studies: No results found.

## 2017-06-25 NOTE — Progress Notes (Signed)
cc'ed to pcp °

## 2017-07-03 ENCOUNTER — Ambulatory Visit (INDEPENDENT_AMBULATORY_CARE_PROVIDER_SITE_OTHER): Payer: Medicare Other | Admitting: *Deleted

## 2017-07-03 DIAGNOSIS — G890 Central pain syndrome: Secondary | ICD-10-CM | POA: Diagnosis not present

## 2017-07-03 DIAGNOSIS — I6310 Cerebral infarction due to embolism of unspecified precerebral artery: Secondary | ICD-10-CM

## 2017-07-03 DIAGNOSIS — M25531 Pain in right wrist: Secondary | ICD-10-CM | POA: Diagnosis not present

## 2017-07-03 DIAGNOSIS — G894 Chronic pain syndrome: Secondary | ICD-10-CM | POA: Diagnosis not present

## 2017-07-06 ENCOUNTER — Encounter: Payer: Self-pay | Admitting: *Deleted

## 2017-07-06 ENCOUNTER — Ambulatory Visit (HOSPITAL_COMMUNITY)
Admission: RE | Admit: 2017-07-06 | Discharge: 2017-07-06 | Disposition: A | Discharge status: Home / Self Care | Payer: Medicare Other | Source: Ambulatory Visit | Attending: Gastroenterology | Admitting: Gastroenterology

## 2017-07-06 ENCOUNTER — Ambulatory Visit: Payer: Self-pay | Admitting: Cardiology

## 2017-07-06 DIAGNOSIS — R634 Abnormal weight loss: Secondary | ICD-10-CM | POA: Diagnosis present

## 2017-07-06 DIAGNOSIS — N200 Calculus of kidney: Secondary | ICD-10-CM | POA: Insufficient documentation

## 2017-07-06 DIAGNOSIS — K7460 Unspecified cirrhosis of liver: Secondary | ICD-10-CM | POA: Diagnosis not present

## 2017-07-06 DIAGNOSIS — K5900 Constipation, unspecified: Secondary | ICD-10-CM | POA: Insufficient documentation

## 2017-07-06 LAB — CBC WITH DIFFERENTIAL/PLATELET
Basophils Absolute: 59 cells/uL (ref 0–200)
Basophils Relative: 1 %
Eosinophils Absolute: 83 cells/uL (ref 15–500)
Eosinophils Relative: 1.4 %
HCT: 43.5 % (ref 38.5–50.0)
Hemoglobin: 15.5 g/dL (ref 13.2–17.1)
Lymphs Abs: 2867 cells/uL (ref 850–3900)
MCH: 31.5 pg (ref 27.0–33.0)
MCHC: 35.6 g/dL (ref 32.0–36.0)
MCV: 88.4 fL (ref 80.0–100.0)
MPV: 9 fL (ref 7.5–12.5)
Monocytes Relative: 11.4 %
Neutro Abs: 2218 cells/uL (ref 1500–7800)
Neutrophils Relative %: 37.6 %
Platelets: 214 10*3/uL (ref 140–400)
RBC: 4.92 10*6/uL (ref 4.20–5.80)
RDW: 12.9 % (ref 11.0–15.0)
Total Lymphocyte: 48.6 %
WBC mixed population: 673 cells/uL (ref 200–950)
WBC: 5.9 10*3/uL (ref 3.8–10.8)

## 2017-07-06 LAB — COMPREHENSIVE METABOLIC PANEL
AG Ratio: 2 (calc) (ref 1.0–2.5)
ALT: 7 U/L — ABNORMAL LOW (ref 9–46)
AST: 11 U/L (ref 10–35)
Albumin: 4.4 g/dL (ref 3.6–5.1)
Alkaline phosphatase (APISO): 45 U/L (ref 40–115)
BUN: 16 mg/dL (ref 7–25)
CO2: 31 mmol/L (ref 20–32)
Calcium: 9.3 mg/dL (ref 8.6–10.3)
Chloride: 100 mmol/L (ref 98–110)
Creat: 0.82 mg/dL (ref 0.70–1.25)
Globulin: 2.2 g/dL (calc) (ref 1.9–3.7)
Glucose, Bld: 95 mg/dL (ref 65–139)
Potassium: 4 mmol/L (ref 3.5–5.3)
Sodium: 137 mmol/L (ref 135–146)
Total Bilirubin: 0.8 mg/dL (ref 0.2–1.2)
Total Protein: 6.6 g/dL (ref 6.1–8.1)

## 2017-07-06 LAB — PROTIME-INR
INR: 1
Prothrombin Time: 10.8 s (ref 9.0–11.5)

## 2017-07-06 NOTE — Progress Notes (Deleted)
Clinical Summary Mr. Schnapp is a 67 y.o.male  1. SOB - 11/2016 stress test without ischemia - echo 10/2016 LVEF 65-70%, no WMAs, mild gradient, no provocative maneuvers performed. Previous gradient of 47mmHg with Valsalva - previous PFTs. Mild obstruction, air trapping, high airway resistance. Moderate emphysema on CT  - symptoms overall unchanged since last visit.    2. AAA - 3.3 cm noted on previous imagaing.   3. Thoracic aneurysm - CT 11/2016 3.9 cm ascedning aneurysm stable, 3.9 cm descending aneurysm.   4. Dynamic LV gradient - symetrical hypertrophy with dynamic LV function with chordal SAM Past Medical History:  Diagnosis Date  . Abdominal aortic aneurysm ([place])   . Abnormality of gait 10/03/2014  . Anxiety   . Arthritis   . BPH (benign prostatic hyperplasia)   . Carpal tunnel syndrome   . Cerebrovascular disease 10/03/2014  . Chronic back pain    chronic narcotic use  . Chronic hepatitis C ([place]) 1980   [place] 2016, cured. HCV RNA negative 01/2016  . Chronic pain   . Degenerative disc disease   . Depression   . Head trauma   . Hepatic cirrhosis ([place]) 02/19/2016  . Hypertension   . Hyperthyroidism   . Memory difficulties 10/03/2014  . Partial complex seizure disorder without intractable epilepsy ([place]) 10/03/2014  . Seizures ([place])    Epilepsy  . Stroke [facility]) 2012   left hemiplegia  . TIA (transient ischemic attack)   . Tremor   . Tremor, essential 10/02/2016  . UTI (lower urinary tract infection)      Allergies  Allergen Reactions  . Keppra [Levetiracetam] Other (See Comments)    Suicidal ideation, anaphylaxsis  . Adhesive [Tape]     Severe rash  . Methadone     Delerium  . Morphine And Related Nausea And Vomiting    Can only take through IV.     Current Outpatient Medications  Medication Sig Dispense Refill  . albuterol (PROVENTIL HFA;VENTOLIN HFA) 108 (90 Base) MCG/ACT inhaler Inhale 2 puffs into the lungs every 6 (six) hours as needed for  wheezing or shortness of breath. 1 Inhaler 2  . ALPRAZolam (XANAX) 0.25 MG tablet Take 0.25 mg by mouth 3 (three) times daily as needed for anxiety.    . ASPIRIN 81 PO Take by mouth daily.    . Cholecalciferol (VITAMIN D) 2000 units CAPS Take by mouth daily.    . clopidogrel (PLAVIX) 75 MG tablet Take 75 mg by mouth daily. Reported on 10/03/2015    . divalproex (DEPAKOTE) 500 MG DR tablet Take 250 mg by mouth 2 (two) times daily.     Marland Kitchen docusate sodium (COLACE) 100 MG capsule Take 100 mg by mouth 2 (two) times daily.     . DULoxetine (CYMBALTA) 60 MG capsule Take 90 mg by mouth daily.     . fluticasone furoate-vilanterol (BREO ELLIPTA) 100-25 MCG/INH AEPB Inhale 1 puff into the lungs 2 (two) times daily.     . Fluticasone-Umeclidin-Vilant (TRELEGY ELLIPTA) 100-62.5-25 MCG/INH AEPB Inhale 1 puff into the lungs daily.    . hydrALAZINE (APRESOLINE) 25 MG tablet Take 25 mg by mouth 2 (two) times daily.     Marland Kitchen lisinopril (PRINIVIL,ZESTRIL) 40 MG tablet Take 40 mg by mouth daily.    . Megestrol Acetate (MEGACE ORAL PO) Take 1 tablet by mouth daily.    . methimazole (TAPAZOLE) 5 MG tablet Take 5 mg by mouth every evening.     . Oxcarbazepine (TRILEPTAL) 300  MG tablet Take 300 mg by mouth 2 (two) times daily.    . Oxycodone HCl 20 MG TABS Take 1 tablet by mouth every 6 (six) hours.     . polyethylene glycol powder (GLYCOLAX/MIRALAX) powder Take one capful every evening on days you do not have a good bowel movement. 527 g 5  . sildenafil (VIAGRA) 50 MG tablet Take 25 mg by mouth daily as needed for erectile dysfunction.     . Tamsulosin HCl (FLOMAX) 0.4 MG CAPS Take 0.4 mg by mouth every evening.     . traZODone (DESYREL) 50 MG tablet Take 50 mg by mouth at bedtime.     No current facility-administered medications for this visit.      Past Surgical History:  Procedure Laterality Date  . EP IMPLANTABLE DEVICE N/A 11/15/2014   Procedure: Loop Recorder Insertion;  Surgeon: Thompson Grayer, MD;  Location: [facility];  Service: Cardiovascular;  Laterality: N/A;  . ESOPHAGOGASTRODUODENOSCOPY (EGD) WITH PROPOFOL N/A 03/20/2016   Procedure: ESOPHAGOGASTRODUODENOSCOPY (EGD) WITH PROPOFOL;  Surgeon: Daneil Dolin, MD;  Location: [facility];  Service: Endoscopy;  Laterality: N/A;  . HAND SURGERY     right  . NECK SURGERY    . right hand reconstruction       Allergies  Allergen Reactions  . Keppra [Levetiracetam] Other (See Comments)    Suicidal ideation, anaphylaxsis  . Adhesive [Tape]     Severe rash  . Methadone     Delerium  . Morphine And Related Nausea And Vomiting    Can only take through IV.      Family History  Problem Relation Age of Onset  . Cervical cancer Mother   . Heart failure Father   . Lung cancer Sister   . Breast cancer Sister   . Colon cancer Neg Hx   . Colon polyps Neg Hx      Social History Mr. Bouldin reports that he has been smoking cigarettes.  He started smoking about 55 years ago. He has a 45.00 pack-year smoking history. he has never used smokeless tobacco. Mr. Sturtevant reports that he does not drink alcohol.   Review of Systems CONSTITUTIONAL: No weight loss, fever, chills, weakness or fatigue.  HEENT: Eyes: No visual loss, blurred vision, double vision or yellow sclerae.No hearing loss, sneezing, congestion, runny nose or sore throat.  SKIN: No rash or itching.  CARDIOVASCULAR:  RESPIRATORY: No shortness of breath, cough or sputum.  GASTROINTESTINAL: No anorexia, nausea, vomiting or diarrhea. No abdominal pain or blood.  GENITOURINARY: No burning on urination, no polyuria NEUROLOGICAL: No headache, dizziness, syncope, paralysis, ataxia, numbness or tingling in the extremities. No change in bowel or bladder control.  MUSCULOSKELETAL: No muscle, back pain, joint pain or stiffness.  LYMPHATICS: No enlarged nodes. No history of splenectomy.  PSYCHIATRIC: No history of depression or anxiety.  ENDOCRINOLOGIC: No reports of sweating, cold or  heat intolerance. No polyuria or polydipsia.  Marland Kitchen   Physical Examination There were no vitals filed for this visit. There were no vitals filed for this visit.  Gen: resting comfortably, no acute distress HEENT: no scleral icterus, pupils equal round and reactive, no palptable cervical adenopathy,  CV Resp: Clear to auscultation bilaterally GI: abdomen is soft, non-tender, non-distended, normal bowel sounds, no hepatosplenomegaly MSK: extremities are warm, no edema.  Skin: warm, no rash Neuro:  no focal deficits Psych: appropriate affect   Diagnostic Studies     Assessment and Plan   10/2014 echo Study  Conclusions  - Left ventricle: The cavity size was mildly reduced. Wall thickness was increased in a pattern of moderate LVH. Systolic function was vigorous. The estimated ejection fraction was in the range of 65% to 70%. There is mitral chordal SAM. Resting LVOT gradient 27 mmHg with increase to 77 mmHg with Valsalva consistent with hypertrophic obstructive cardiomyopathy. Wall motion was normal; there were no regional wall motion abnormalities. Doppler parameters are consistent with abnormal left ventricular relaxation (grade 1 diastolic dysfunction). - Aortic valve: Mildly calcified annulus. Trileaflet; mildly calcified leaflets. Peak gradient (S): 27 mm Hg at rest reflecting LVOT flow. - Aortic root: The aortic root was mildly ectatic. - Mitral valve: Calcified annulus. There was trivial regurgitation. - Left atrium: The atrium was at the upper limits of normal in size. - Right atrium: Central venous pressure (est): 3 mm Hg. - Atrial septum: No defect or patent foramen ovale was identified. - Tricuspid valve: There was trivial regurgitation. - Pulmonary arteries: Systolic pressure could not be accurately estimated. - Pericardium, extracardiac: There was no pericardial effusion.  2017 PFTs Mild obstruction, air trapping, high airway  resistance.  11/2016 nuclear stress test  There was no ST segment deviation noted during stress.  The study is normal. No ischemia or scar.  This is a low risk study.  Nuclear stress EF: 54%.   Assessment and Plan  1. SOB - fairly benign cardiac workup. Stress test without ischemia. Echo with normal LVEF. He does have diastolic dysfunction as well as an intracavitary dynamic gradient. Treatment for this is limited due to severe bradycardia on beta blockers in the past - we will refer to pulmonary to see if further management of his COPD may help his symptoms.  - EKG in clinic without acute ischemic changes.       Arnoldo Lenis, M.D., F.A.C.C.

## 2017-07-06 NOTE — Progress Notes (Signed)
Carelink Summary Report / Loop Recorder 

## 2017-07-09 NOTE — Progress Notes (Signed)
Labs are normal. U/s with nonobstructing kidney stones. Stable liver, no overt cirrhosis but heterogenous echotexture. Managed as early cirrhosis given portal HTN on prior EGD.   Next labs 6 months, CMET, PT/INR, CBC, AFP.  He is already getting imaging in about six month for f/u of pancreas lesion and will see liver again then.

## 2017-07-13 ENCOUNTER — Ambulatory Visit: Payer: Medicare Other | Admitting: Neurology

## 2017-07-15 DIAGNOSIS — G459 Transient cerebral ischemic attack, unspecified: Secondary | ICD-10-CM | POA: Diagnosis not present

## 2017-07-15 DIAGNOSIS — Z720 Tobacco use: Secondary | ICD-10-CM | POA: Diagnosis not present

## 2017-07-15 DIAGNOSIS — Z87820 Personal history of traumatic brain injury: Secondary | ICD-10-CM | POA: Diagnosis not present

## 2017-07-17 LAB — CUP PACEART REMOTE DEVICE CHECK
Date Time Interrogation Session: 20190104170825
Implantable Pulse Generator Implant Date: 20160518

## 2017-07-23 ENCOUNTER — Encounter: Payer: Self-pay | Admitting: Neurology

## 2017-07-23 ENCOUNTER — Ambulatory Visit (INDEPENDENT_AMBULATORY_CARE_PROVIDER_SITE_OTHER): Payer: Medicare Other | Admitting: Neurology

## 2017-07-23 ENCOUNTER — Ambulatory Visit: Payer: Medicare Other | Admitting: Neurology

## 2017-07-23 VITALS — BP 121/80 | HR 77 | Ht 68.0 in | Wt 127.5 lb

## 2017-07-23 DIAGNOSIS — Z5181 Encounter for therapeutic drug level monitoring: Secondary | ICD-10-CM

## 2017-07-23 DIAGNOSIS — R269 Unspecified abnormalities of gait and mobility: Secondary | ICD-10-CM

## 2017-07-23 DIAGNOSIS — G40209 Localization-related (focal) (partial) symptomatic epilepsy and epileptic syndromes with complex partial seizures, not intractable, without status epilepticus: Secondary | ICD-10-CM

## 2017-07-23 DIAGNOSIS — R413 Other amnesia: Secondary | ICD-10-CM | POA: Diagnosis not present

## 2017-07-23 DIAGNOSIS — I679 Cerebrovascular disease, unspecified: Secondary | ICD-10-CM | POA: Diagnosis not present

## 2017-07-23 MED ORDER — TRAZODONE HCL 100 MG PO TABS: 100.0000 mg | ORAL_TABLET | Freq: Every day | ORAL | 3 refills | Status: DC

## 2017-07-23 NOTE — Patient Instructions (Signed)
   We will increase the trazodone to 100 mg at night.

## 2017-07-23 NOTE — [place] Notes (Signed)
Reason for visit: Seizures, cerebrovascular disease, memory disorder  Eric Tran is an 67 y.o. male  History of present illness:  Eric Tran is a 67 year old right-handed white male with a history of cerebrovascular disease and a history of a gait disorder.  The patient has fallen on 3 occasions since last seen, he does use a cane for ambulation and he has a walker to use inside the house.  The patient may fall even using these devices.  The patient continues to have significant problems with insomnia, he has a lot of anxiety issues, and crying spells as well.  The patient is on low-dose trazodone 50 mg at night.  He is on Depakote and Trileptal for his seizures, he has not had any recurrence.  The patient is followed through a pain center for his low back pain and leg discomfort.  He continues to have significant discomfort.  The patient has had some progression of his memory, he has not been able to tolerate Namenda and he has not been on Aricept given the history of the seizures.  The patient returns this office for further evaluation.  Past Medical History:  Diagnosis Date  . Abdominal aortic aneurysm ([place])   . Abnormality of gait 10/03/2014  . Anxiety   . Arthritis   . BPH (benign prostatic hyperplasia)   . Carpal tunnel syndrome   . Cerebrovascular disease 10/03/2014  . Chronic back pain    chronic narcotic use  . Chronic hepatitis C ([place]) 1980   Harvoni 2016, cured. HCV RNA negative 01/2016  . Chronic pain   . Degenerative disc disease   . Depression   . Head trauma   . Hepatic cirrhosis ([place]) 02/19/2016  . Hypertension   . Hyperthyroidism   . Memory difficulties 10/03/2014  . Partial complex seizure disorder without intractable epilepsy ([place]) 10/03/2014  . Seizures ([place])    Epilepsy  . Stroke [facility]) 2012   left hemiplegia  . TIA (transient ischemic attack)   . Tremor   . Tremor, essential 10/02/2016  . UTI (lower urinary tract infection)     Past Surgical History:    Procedure Laterality Date  . EP IMPLANTABLE DEVICE N/A 11/15/2014   Procedure: Loop Recorder Insertion;  Surgeon: Thompson Grayer, MD;  Location: [facility];  Service: Cardiovascular;  Laterality: N/A;  . ESOPHAGOGASTRODUODENOSCOPY (EGD) WITH PROPOFOL N/A 03/20/2016   Procedure: ESOPHAGOGASTRODUODENOSCOPY (EGD) WITH PROPOFOL;  Surgeon: Daneil Dolin, MD;  [facility];  Service: Endoscopy;  Laterality: N/A;  . HAND SURGERY     right  . NECK SURGERY    . right hand reconstruction      Family History  Problem Relation Age of Onset  . Cervical cancer Mother   . Heart failure Father   . Lung cancer Sister   . Breast cancer Sister   . Colon cancer Neg Hx   . Colon polyps Neg Hx     Social history:  reports that he has been smoking cigarettes.  He started smoking about 55 years ago. He has a 45.00 pack-year smoking history. he has never used smokeless tobacco. He reports that he does not drink alcohol or use drugs.    Allergies  Allergen Reactions  . Keppra [Levetiracetam] Other (See Comments)    Suicidal ideation, anaphylaxsis  . Adhesive [Tape]     Severe rash  . Methadone     Delerium  . Morphine And Related Nausea And Vomiting    Can  only take through IV.    Medications:  Prior to Admission medications   Medication Sig Start Date End Date Taking? Authorizing Provider  albuterol (PROVENTIL HFA;VENTOLIN HFA) 108 (90 Base) MCG/ACT inhaler Inhale 2 puffs into the lungs every 6 (six) hours as needed for wheezing or shortness of breath. 01/31/16  Yes Branch, Alphonse Guild, MD  ALPRAZolam Duanne Moron) 0.25 MG tablet Take 0.25 mg by mouth 3 (three) times daily as needed for anxiety.   Yes [provider]  ASPIRIN 81 PO Take by mouth daily.   Yes [provider]  Cholecalciferol (VITAMIN D) 2000 units CAPS Take by mouth daily.   Yes [provider]  clopidogrel (PLAVIX) 75 MG tablet Take 75 mg by mouth daily. Reported on 10/03/2015   Yes [provider]  divalproex (DEPAKOTE) 500 MG DR tablet Take 250 mg by mouth 2 (two) times daily.    Yes [provider]  docusate sodium (COLACE) 100 MG capsule Take 100 mg by mouth 2 (two) times daily.    Yes [provider]  DULoxetine (CYMBALTA) 60 MG capsule Take 90 mg by mouth daily.    Yes [provider]  fluticasone furoate-vilanterol (BREO ELLIPTA) 100-25 MCG/INH AEPB Inhale 1 puff into the lungs 2 (two) times daily.    Yes [provider]  Fluticasone-Umeclidin-Vilant (TRELEGY ELLIPTA) 100-62.5-25 MCG/INH AEPB Inhale 1 puff into the lungs daily.   Yes [provider]  hydrALAZINE (APRESOLINE) 25 MG tablet Take 25 mg by mouth 2 (two) times daily.    Yes [provider]  lisinopril (PRINIVIL,ZESTRIL) 40 MG tablet Take 40 mg by mouth daily.   Yes [provider]  Megestrol Acetate (MEGACE ORAL PO) Take 1 tablet by mouth daily.   Yes [provider]  methimazole (TAPAZOLE) 5 MG tablet Take 5 mg by mouth every evening.    Yes [provider]  Oxcarbazepine (TRILEPTAL) 300 MG tablet Take 300 mg by mouth 2 (two) times daily.   Yes [provider]  Oxycodone HCl 20 MG TABS Take 1 tablet by mouth every 6 (six) hours.  11/07/16  Yes [provider]  polyethylene glycol powder (GLYCOLAX/MIRALAX) powder Take one capful every evening on days you do not have a good bowel movement. 06/25/17  Yes Mahala Menghini, PA-C  sildenafil (VIAGRA) 50 MG tablet Take 25 mg by mouth daily as needed for erectile dysfunction.    Yes [provider]  Tamsulosin HCl (FLOMAX) 0.4 MG CAPS Take 0.4 mg by mouth every evening.    Yes [provider]  traZODone (DESYREL) 100 MG tablet Take 1 tablet (100 mg total) by mouth at bedtime. 07/23/17   Kathrynn Ducking, MD    ROS:  Out of a complete 14 system review of symptoms, the patient complains only of the following symptoms, and all other reviewed systems are  negative.  Decreased activity, decreased appetite, fatigue, excessive sweating Runny nose Eye pain, blurred vision Shortness of breath, choking Leg swelling, heart murmur Cold intolerance Constipation, nausea Restless legs, insomnia, frequent waking, daytime sleepiness, snoring Joint pain, joint swelling, back pain, aching muscles, muscle cramps, walking difficulty Memory loss, dizziness, headache, numbness, speech difficulty, weakness Confusion, depression, anxiety, suicidal thoughts  Blood pressure 121/80, pulse 77, height 5\' 8"  (1.727 m), weight 127 lb 8 oz (57.8 kg), SpO2 97 %.  Physical Exam  General: The patient is alert and cooperative at the time of the examination.  Skin: No significant peripheral edema is noted.  Neurologic Exam  Mental status: The patient is alert and oriented x 3 at the time of the examination. The Mini-Mental status examination done today shows a total score 25/30   Cranial nerves: Facial symmetry is present. Speech is normal, no aphasia or dysarthria is noted. Extraocular movements are full. Visual fields are full.  Motor: The patient has good strength in all 4 extremities.  Sensory examination: Soft touch sensation is symmetric on the face, arms, and legs.  Coordination: The patient has good finger-nose-finger and heel-to-shin bilaterally.  Gait and station: The patient has a slightly wide-based gait, the patient usually uses a cane for ambulation.  Tandem gait was not attempted.  Romberg is negative. No drift is seen.  Reflexes: Deep tendon reflexes are symmetric.   Assessment/Plan:  1.  History of cerebrovascular disease  2.  Gait disturbance  3.  Chronic low back pain, followed through a pain center  4.  Memory disturbance  5.  Chronic insomnia  The patient is having difficulty sleeping, he does have chronic pain issues.  He will be increased on the trazodone taking 100 mg at night, if this is not effective, he will go up to 150  mg at night.  A prescription was sent in for the trazodone.  The patient also likely has some underlying anxiety and depression issues.  The memory will continue to be followed, he is not on any medications for memory.  When last seen, he was sent for a swallowing evaluation secondary to reports of dysphagia, no significant alterations to his diet were made.  The patient will follow-up in 6 months.  Jill Alexanders MD 07/23/2017 11:32 AM  [facility] [address] [place], [place] [address]  Phone [phone] Fax [phone]

## 2017-07-27 ENCOUNTER — Telehealth: Payer: Self-pay | Admitting: Neurology

## 2017-07-27 LAB — 10-HYDROXYCARBAZEPINE: Oxcarbazepine SerPl-Mcnc: NOT DETECTED ug/mL (ref 10–35)

## 2017-07-27 LAB — VALPROIC ACID LEVEL: Valproic Acid Lvl: 19 ug/mL — ABNORMAL LOW (ref 50–100)

## 2017-07-27 MED ORDER — DIVALPROEX SODIUM 500 MG PO DR TAB: 500.0000 mg | DELAYED_RELEASE_TABLET | Freq: Two times a day (BID) | ORAL | 3 refills | Status: DC

## 2017-07-27 NOTE — Telephone Encounter (Signed)
I called the patient.  The Trileptal level is 0, the patient apparently is not taking medication on a regular basis.  We will stop the drug, we will increase the Depakote 500 mg twice daily, the Depakote levels remain low but he is only on 250 mg twice daily.  The patient will begin taking 500 mg twice daily.  A new prescription for Depakote was called in.

## 2017-08-03 ENCOUNTER — Ambulatory Visit (INDEPENDENT_AMBULATORY_CARE_PROVIDER_SITE_OTHER): Payer: Medicare Other | Admitting: *Deleted

## 2017-08-03 DIAGNOSIS — I6310 Cerebral infarction due to embolism of unspecified precerebral artery: Secondary | ICD-10-CM

## 2017-08-03 NOTE — Progress Notes (Signed)
Carelink Summary Reprot / Loop Recorder 

## 2017-08-25 DIAGNOSIS — G890 Central pain syndrome: Secondary | ICD-10-CM | POA: Diagnosis not present

## 2017-08-25 DIAGNOSIS — G894 Chronic pain syndrome: Secondary | ICD-10-CM | POA: Diagnosis not present

## 2017-08-25 DIAGNOSIS — M25531 Pain in right wrist: Secondary | ICD-10-CM | POA: Diagnosis not present

## 2017-08-25 LAB — CUP PACEART REMOTE DEVICE CHECK
Date Time Interrogation Session: 20190203173943
Implantable Pulse Generator Implant Date: 20160518

## 2017-08-27 ENCOUNTER — Ambulatory Visit (INDEPENDENT_AMBULATORY_CARE_PROVIDER_SITE_OTHER): Payer: Medicare Other | Admitting: Cardiology

## 2017-08-27 ENCOUNTER — Other Ambulatory Visit: Payer: Self-pay

## 2017-08-27 ENCOUNTER — Encounter: Payer: Self-pay | Admitting: Cardiology

## 2017-08-27 VITALS — BP 166/99 | HR 92 | Ht 68.0 in | Wt 128.0 lb

## 2017-08-27 DIAGNOSIS — F329 Major depressive disorder, single episode, unspecified: Secondary | ICD-10-CM | POA: Diagnosis not present

## 2017-08-27 DIAGNOSIS — F32A Depression, unspecified: Secondary | ICD-10-CM

## 2017-08-27 DIAGNOSIS — I517 Cardiomegaly: Secondary | ICD-10-CM | POA: Diagnosis not present

## 2017-08-27 DIAGNOSIS — Z8673 Personal history of transient ischemic attack (TIA), and cerebral infarction without residual deficits: Secondary | ICD-10-CM | POA: Diagnosis not present

## 2017-08-27 DIAGNOSIS — R0602 Shortness of breath: Secondary | ICD-10-CM | POA: Diagnosis not present

## 2017-08-27 NOTE — Patient Instructions (Signed)
Your physician wants you to follow-up in: [place] will receive a reminder letter in the mail two months in advance. If you don't receive a letter, please call our office to schedule the follow-up appointment.  Your physician recommends that you continue on your current medications as directed. Please refer to the Current Medication list given to you today.  You have been referred to [facility]  Thank you for choosing [facility]!!

## 2017-08-27 NOTE — Progress Notes (Signed)
Clinical Summary Mr. Pelzer is a 67 y.o.male seen today for follow up of the following medical problems.   1. SOB - 11/2016 stress test without ischemia - echo 10/2016 LVEF 65-70%, no WMAs, mild gradient, no provocative maneuvers performed. Previous gradient of 4mmHg with Valsalva - previous PFTs. Mild obstruction, air trapping, high airway resistance. Moderate emphysema on CT   - stable SOB, overall unchanged. - DOE at 1/2 to 1 block     2. AAA - 3.3 cm noted on previous imagaing.  - Jan 2019 3.2 cm aneurysm - no recent symptoms  3. Thoracic aneurysm - CT 11/2016 3.9 cm ascedning aneurysm stable, 3.9 cm descending aneurysm.  - no recent symptoms  4. COPD - followed by Dr [name].   5. Hx of CVA - patient with history of multiple CVAs in the past. According to neurology notes has had a pontine stroke, left cerebellar stroke, bilateral thalamic strokes, a left caudate stroke, right posterior limb of internal capsule stroke, right frontal stroke.  . - based on high concern for possible cardioembolic etiology, patient was originally referred for outpatient cardiac monitoring for possible afib/aflutter. He preferred an implanted loop recorder as opposed to external monitor, this was placed by Dr Rayann Heman   - normal loop recorder checks since last visit  6. Left ventricular hypertrophy - dynamic gradient noted on echo, symmetric moderate hypertrophy and hyperdynamic LV is described.   - he is on propanlol for headaches, we increased the dose last visit due to his dynamic gradient, tolerated well.   - propanolol discontinued after recent admission with near syncope and significant pause on loop recorder.  - chronic SOB unchanged. No significant chest pain, no syncope.   - echo 2016 wall thickness 70mm, hyperdynamic LV. Provoked dynamic gradient of 30mmHg with chordal SAM - chronic SOB that is unchanged.   7. OSA - sleep study at [facility] - does not use CPAP  machine due to discomfort, has not had refitting. Followed [facility] the [facility]  8. HTN - elevated in clinic, has not taken meds today.    Past Medical History:  Diagnosis Date  . Abdominal aortic aneurysm ([place])   . Abnormality of gait 10/03/2014  . Anxiety   . Arthritis   . BPH (benign prostatic hyperplasia)   . Carpal tunnel syndrome   . Cerebrovascular disease 10/03/2014  . Chronic back pain    chronic narcotic use  . Chronic hepatitis C ([place]) 1980   Harvoni 2016, cured. HCV RNA negative 01/2016  . Chronic pain   . Degenerative disc disease   . Depression   . Head trauma   . Hepatic cirrhosis ([place]) 02/19/2016  . Hypertension   . Hyperthyroidism   . Memory difficulties 10/03/2014  . Partial complex seizure disorder without intractable epilepsy ([place]) 10/03/2014  . Seizures ([place])    Epilepsy  . Stroke [facility]) 2012   left hemiplegia  . TIA (transient ischemic attack)   . Tremor   . Tremor, essential 10/02/2016  . UTI (lower urinary tract infection)      Allergies  Allergen Reactions  . Keppra [Levetiracetam] Other (See Comments)    Suicidal ideation, anaphylaxsis  . Adhesive [Tape]     Severe rash  . Methadone     Delerium  . Morphine And Related Nausea And Vomiting    Can only take through IV.     Current Outpatient Medications  Medication Sig Dispense Refill  . albuterol (PROVENTIL HFA;VENTOLIN HFA) 108 (90 Base)  MCG/ACT inhaler Inhale 2 puffs into the lungs every 6 (six) hours as needed for wheezing or shortness of breath. 1 Inhaler 2  . ALPRAZolam (XANAX) 0.25 MG tablet Take 0.25 mg by mouth 3 (three) times daily as needed for anxiety.    . ASPIRIN 81 PO Take by mouth daily.    . Cholecalciferol (VITAMIN D) 2000 units CAPS Take by mouth daily.    . clopidogrel (PLAVIX) 75 MG tablet Take 75 mg by mouth daily. Reported on 10/03/2015    . divalproex (DEPAKOTE) 500 MG DR tablet Take 1 tablet (500 mg total) by mouth 2 (two) times daily. 180 tablet 3  . docusate sodium (COLACE) 100 MG  capsule Take 100 mg by mouth 2 (two) times daily.     . DULoxetine (CYMBALTA) 60 MG capsule Take 90 mg by mouth daily.     . fluticasone furoate-vilanterol (BREO ELLIPTA) 100-25 MCG/INH AEPB Inhale 1 puff into the lungs 2 (two) times daily.     . Fluticasone-Umeclidin-Vilant (TRELEGY ELLIPTA) 100-62.5-25 MCG/INH AEPB Inhale 1 puff into the lungs daily.    . hydrALAZINE (APRESOLINE) 25 MG tablet Take 25 mg by mouth 2 (two) times daily.     Marland Kitchen lisinopril (PRINIVIL,ZESTRIL) 40 MG tablet Take 40 mg by mouth daily.    . Megestrol Acetate (MEGACE ORAL PO) Take 1 tablet by mouth daily.    . methimazole (TAPAZOLE) 5 MG tablet Take 5 mg by mouth every evening.     . Oxycodone HCl 20 MG TABS Take 1 tablet by mouth every 6 (six) hours.     . polyethylene glycol powder (GLYCOLAX/MIRALAX) powder Take one capful every evening on days you do not have a good bowel movement. 527 g 5  . sildenafil (VIAGRA) 50 MG tablet Take 25 mg by mouth daily as needed for erectile dysfunction.     . Tamsulosin HCl (FLOMAX) 0.4 MG CAPS Take 0.4 mg by mouth every evening.     . traZODone (DESYREL) 100 MG tablet Take 1 tablet (100 mg total) by mouth at bedtime. 30 tablet 3   No current facility-administered medications for this visit.      Past Surgical History:  Procedure Laterality Date  . EP IMPLANTABLE DEVICE N/A 11/15/2014   Procedure: Loop Recorder Insertion;  Surgeon: Thompson Grayer, MD;  Location: [facility];  Service: Cardiovascular;  Laterality: N/A;  . ESOPHAGOGASTRODUODENOSCOPY (EGD) WITH PROPOFOL N/A 03/20/2016   Procedure: ESOPHAGOGASTRODUODENOSCOPY (EGD) WITH PROPOFOL;  Surgeon: Daneil Dolin, MD;  [facility];  Service: Endoscopy;  Laterality: N/A;  . HAND SURGERY     right  . NECK SURGERY    . right hand reconstruction       Allergies  Allergen Reactions  . Keppra [Levetiracetam] Other (See Comments)    Suicidal ideation, anaphylaxsis  . Adhesive [Tape]     Severe rash  . Methadone      Delerium  . Morphine And Related Nausea And Vomiting    Can only take through IV.      Family History  Problem Relation Age of Onset  . Cervical cancer Mother   . Heart failure Father   . Lung cancer Sister   . Breast cancer Sister   . Colon cancer Neg Hx   . Colon polyps Neg Hx      Social History Mr. Costilla reports that he has been smoking cigarettes.  He started smoking about 55 years ago. He has a 45.00 pack-year smoking history. he has  never used smokeless tobacco. Mr. Kamaka reports that he does not drink alcohol.   Review of Systems CONSTITUTIONAL: No weight loss, fever, chills, weakness or fatigue.  HEENT: Eyes: No visual loss, blurred vision, double vision or yellow sclerae.No hearing loss, sneezing, congestion, runny nose or sore throat.  SKIN: No rash or itching.  CARDIOVASCULAR: per hpi RESPIRATORY: per hpi GASTROINTESTINAL: No anorexia, nausea, vomiting or diarrhea. No abdominal pain or blood.  GENITOURINARY: No burning on urination, no polyuria NEUROLOGICAL: No headache, dizziness, syncope, paralysis, ataxia, numbness or tingling in the extremities. No change in bowel or bladder control.  MUSCULOSKELETAL: No muscle, back pain, joint pain or stiffness.  LYMPHATICS: No enlarged nodes. No history of splenectomy.  PSYCHIATRIC: +depression ENDOCRINOLOGIC: No reports of sweating, cold or heat intolerance. No polyuria or polydipsia.  Marland Kitchen   Physical Examination Vitals:   08/27/17 1428  BP: (!) 166/99  Pulse: 92  SpO2: 96%   Vitals:   08/27/17 1428  Weight: 128 lb (58.1 kg)  Height: 5\' 8"  (1.727 m)    Gen: resting comfortably, no acute distress HEENT: no scleral icterus, pupils equal round and reactive, no palptable cervical adenopathy,  CV: RRR, no m/r/g, no jvd Resp: Clear to auscultation bilaterally GI: abdomen is soft, non-tender, non-distended, normal bowel sounds, no hepatosplenomegaly MSK: extremities are warm, no edema.  Skin: warm, no  rash Neuro:  no focal deficits Psych: appropriate affect   Diagnostic Studies 10/2014 echo Study Conclusions  - Left ventricle: The cavity size was mildly reduced. Wall thickness was increased in a pattern of moderate LVH. Systolic function was vigorous. The estimated ejection fraction was in the range of 65% to 70%. There is mitral chordal SAM. Resting LVOT gradient 27 mmHg with increase to 77 mmHg with Valsalva consistent with hypertrophic obstructive cardiomyopathy. Wall motion was normal; there were no regional wall motion abnormalities. Doppler parameters are consistent with abnormal left ventricular relaxation (grade 1 diastolic dysfunction). - Aortic valve: Mildly calcified annulus. Trileaflet; mildly calcified leaflets. Peak gradient (S): 27 mm Hg at rest reflecting LVOT flow. - Aortic root: The aortic root was mildly ectatic. - Mitral valve: Calcified annulus. There was trivial regurgitation. - Left atrium: The atrium was at the upper limits of normal in size. - Right atrium: Central venous pressure (est): 3 mm Hg. - Atrial septum: No defect or patent foramen ovale was identified. - Tricuspid valve: There was trivial regurgitation. - Pulmonary arteries: Systolic pressure could not be accurately estimated. - Pericardium, extracardiac: There was no pericardial effusion.  2017 PFTs Mild obstruction, air trapping, high airway resistance.  11/2016 nuclear stress test  There was no ST segment deviation noted during stress.  The study is normal. No ischemia or scar.  This is a low risk study.  Nuclear stress EF: 54%.     Assessment and Plan   1. SOB - fairly benign cardiac workup. Stress test without ischemia. Echo with normal LVEF. He does have diastolic dysfunction as well as an intracavitary dynamic gradient. Treatment for this is limited due to severe bradycardia on beta blockers in the past  - continue to monitor at this time, no  further cardiac workup at this time.    2. Hx of CVA - loop recorder in place, has not had any evidence of arrhythmia as cause.  - continue to f/u in device clinic  3. Left ventricular hypertrophy - dynamic gradient noted on echo due to symmetric LVH and hyperdynamic LV - off beta blocker due to recent pauses noted  on loop recorder and presyncope - continue aggressive hydration   4. HTN -elevated in clinic. Significant issues with prior orthostatic symptoms and presyncope, continue current meds at this time  5. AAA and thoracic aneurysm - continue to monitor  6. Depression - refer to psych Arnoldo Lenis, M.D.

## 2017-09-01 ENCOUNTER — Encounter: Payer: Self-pay | Admitting: Cardiology

## 2017-09-04 ENCOUNTER — Ambulatory Visit (INDEPENDENT_AMBULATORY_CARE_PROVIDER_SITE_OTHER): Payer: Medicare Other | Admitting: *Deleted

## 2017-09-04 DIAGNOSIS — I6310 Cerebral infarction due to embolism of unspecified precerebral artery: Secondary | ICD-10-CM

## 2017-09-04 NOTE — Progress Notes (Signed)
Carelink Summary Report / Loop Recorder 

## 2017-09-23 ENCOUNTER — Encounter: Payer: Self-pay | Admitting: Gastroenterology

## 2017-09-23 ENCOUNTER — Ambulatory Visit (INDEPENDENT_AMBULATORY_CARE_PROVIDER_SITE_OTHER): Payer: Medicare Other | Admitting: Gastroenterology

## 2017-09-23 VITALS — BP 122/86 | HR 103 | Temp 96.4°F | Ht 68.0 in | Wt 129.6 lb

## 2017-09-23 DIAGNOSIS — K7460 Unspecified cirrhosis of liver: Secondary | ICD-10-CM | POA: Diagnosis not present

## 2017-09-23 DIAGNOSIS — K869 Disease of pancreas, unspecified: Secondary | ICD-10-CM | POA: Diagnosis not present

## 2017-09-23 DIAGNOSIS — R634 Abnormal weight loss: Secondary | ICD-10-CM

## 2017-09-23 NOTE — Assessment & Plan Note (Signed)
Will be due for repeat labs and hepatoma surveillance in 12/2017, egd for variceal screening in 02/2018. Given h/o thoracic aneurysm with small  penetrating atherosclerotic ulcer in the anterior distal thoracic aorta.he will be getting repeat imaging for this and for pancreatic lesions. Due reimaging 12/2017. Dr. Cyndia Bent had planned on reimaging at time of 03/2018 f/u with him. I will check with radiology to see if pancreatic lesions can be adequately followed with CTA chest/abd so that hopefully patient can have one imaging. Further recommendations to follow.

## 2017-09-23 NOTE — Progress Notes (Signed)
Primary Care Physician: Rosine Door  Primary Gastroenterologist:  Garfield Cornea, MD   Chief Complaint  Patient presents with  . Cirrhosis    f/u  . Weight Loss    stable    HPI: Eric Tran is a 67 y.o. male here for follow-up.  Last seen in December.History of hep C cirrhosis, hepatitis C was eradicated.  History of failure to thrive, unintentional weight loss with extensive evaluation in the past. H/o of multiple strokes. Patient completed cologuard testing 05/2016 (because of poor prep twice and no desire to attempt prep again) which was negative.  Plan is to repeat Cologuard November 2020.  Chest CT in June 2018 to further investigate his unintentional weight loss showed moderate centrilobular emphysema but no malignancy.  He is being followed for thoracic aortic aneurysm.  Following with Dr. [name] regarding COPD.  Patient's wife states that he has labored breathing fairly frequently and she wonders if he needs oxygen at home.  Follow-up with Dr. [name] planned.CTA of the abdomen demonstrated no significant mesenteric stenosis. No occult pulmonary lesion. 2 small pancreatic lesions for which a one-year follow-up CT is been recommended, planned for July 2019.   He is due for EGD in September 2019 for esophageal variceal screening.  Currently up-to-date on hepatoma screening with ultrasound back in January showing no focal mass.  Mild dilation of the proximal aorta measuring 3.2 cm.  Cardiology aware. saw Dr. Cyndia Bent for thoracic aneurysm last year and one year f/u with CTA chest/abd planned. Next labs due in July 2019 as well.  Plans on reimaging liver at time of follow-up CT in July 2019 for pancreatic lesions.  Weight is up a couple more pounds since we last saw him. Clinically stable. Appetite comes and goes. No abd pain. BMs good. No melena, brbpr. No heartburn.      Current Outpatient Medications  Medication Sig Dispense Refill  . albuterol (PROVENTIL  HFA;VENTOLIN HFA) 108 (90 Base) MCG/ACT inhaler Inhale 2 puffs into the lungs every 6 (six) hours as needed for wheezing or shortness of breath. 1 Inhaler 2  . ALPRAZolam (XANAX) 0.25 MG tablet Take 0.25 mg by mouth 3 (three) times daily as needed for anxiety.    . ASPIRIN 81 PO Take by mouth daily.    . Cholecalciferol (VITAMIN D) 2000 units CAPS Take by mouth daily.    . clopidogrel (PLAVIX) 75 MG tablet Take 75 mg by mouth daily. Reported on 10/03/2015    . divalproex (DEPAKOTE) 500 MG DR tablet Take 1 tablet (500 mg total) by mouth 2 (two) times daily. 180 tablet 3  . docusate sodium (COLACE) 100 MG capsule Take 100 mg by mouth 2 (two) times daily.     . DULoxetine (CYMBALTA) 60 MG capsule Take 90 mg by mouth daily.     . fluticasone furoate-vilanterol (BREO ELLIPTA) 100-25 MCG/INH AEPB Inhale 1 puff into the lungs 2 (two) times daily.     . Fluticasone-Umeclidin-Vilant (TRELEGY ELLIPTA) 100-62.5-25 MCG/INH AEPB Inhale 1 puff into the lungs daily.    Marland Kitchen gabapentin (NEURONTIN) 300 MG capsule Take 600 mg by mouth 3 (three) times daily.    . hydrALAZINE (APRESOLINE) 25 MG tablet Take 25 mg by mouth 2 (two) times daily.     Marland Kitchen lisinopril (PRINIVIL,ZESTRIL) 40 MG tablet Take 40 mg by mouth daily.    . Megestrol Acetate (MEGACE ORAL PO) Take 1 tablet by mouth daily.    . methimazole (TAPAZOLE) 5 MG  tablet Take 5 mg by mouth every evening.     . Oxycodone HCl 20 MG TABS Take 1 tablet by mouth every 6 (six) hours.     . polyethylene glycol powder (GLYCOLAX/MIRALAX) powder Take one capful every evening on days you do not have a good bowel movement. 527 g 5  . sildenafil (VIAGRA) 50 MG tablet Take 25 mg by mouth daily as needed for erectile dysfunction.     . Tamsulosin HCl (FLOMAX) 0.4 MG CAPS Take 0.4 mg by mouth every evening.     . traZODone (DESYREL) 100 MG tablet Take 1 tablet (100 mg total) by mouth at bedtime. (Patient taking differently: Take 100 mg by mouth at bedtime. May take up to 200mg  if  needed) 30 tablet 3   No current facility-administered medications for this visit.     Allergies as of 09/23/2017 - Review Complete 09/23/2017  Allergen Reaction Noted  . Keppra [levetiracetam] Other (See Comments) 10/03/2014  . Adhesive [tape]  11/15/2014  . Methadone  10/02/2016  . Morphine and related Nausea And Vomiting 05/01/2011    ROS:  General: Negative for anorexia, weight loss, fever, chills, fatigue, weakness. ENT: Negative for hoarseness, difficulty swallowing , nasal congestion. CV: Negative for chest pain, angina, palpitations, dyspnea on exertion, peripheral edema.  Respiratory: Negative for dyspnea at rest, dyspnea on exertion, cough, sputum, wheezing.  GI: See history of present illness. GU:  Negative for dysuria, hematuria, urinary incontinence, urinary frequency, nocturnal urination.  Endo: Negative for unusual weight change.    Physical Examination:   BP 122/86   Pulse (!) 103   Temp (!) 96.4 F (35.8 C) (Oral)   Ht 5\' 8"  (1.727 m)   Wt 129 lb 9.6 oz (58.8 kg)   BMI 19.71 kg/m   General: chronically ill appearing, WM, thin, in no acute distress.  Eyes: No icterus. Mouth: Oropharyngeal mucosa moist and pink , no lesions erythema or exudate. Lungs: Clear to auscultation bilaterally.  Heart: Regular rate and rhythm, no murmurs rubs or gallops.  Abdomen: Bowel sounds are normal, nontender, nondistended, no hepatosplenomegaly or masses, no abdominal bruits or hernia , no rebound or guarding.   Extremities: No lower extremity edema. No clubbing or deformities. Neuro: Alert and oriented x 4   Skin: Warm and dry, no jaundice.   Psych: Alert and cooperative, normal mood and affect.  Labs:  Lab Results  Component Value Date   CREATININE 0.82 07/06/2017   BUN 16 07/06/2017   NA 137 07/06/2017   K 4.0 07/06/2017   CL 100 07/06/2017   CO2 31 07/06/2017   Lab Results  Component Value Date   ALT 7 (L) 07/06/2017   AST 11 07/06/2017   ALKPHOS 40  02/24/2017   BILITOT 0.8 07/06/2017   Lab Results  Component Value Date   WBC 5.9 07/06/2017   HGB 15.5 07/06/2017   HCT 43.5 07/06/2017   MCV 88.4 07/06/2017   PLT 214 07/06/2017   Lab Results  Component Value Date   INR 1.0 07/06/2017   INR 1.1 02/19/2016   INR 0.92 05/01/2011    Imaging Studies: No results found.

## 2017-09-23 NOTE — Patient Instructions (Signed)
1. We will have you come back in 01/2018. At that time you will be due for upper endoscopy.  2. You will be due for labs and CT scan to look at liver and pancreas in 12/2017. I will touch base with radiologist and Dr. Cyndia Bent to see if we can accomplish follow up of aneurysms at the same time with one study.  3. Call with any questions or concerns.

## 2017-09-24 NOTE — Progress Notes (Signed)
cc'ed to pcp °

## 2017-09-28 ENCOUNTER — Encounter (HOSPITAL_COMMUNITY): Payer: Self-pay | Admitting: Radiology

## 2017-09-28 ENCOUNTER — Telehealth: Payer: Self-pay | Admitting: Gastroenterology

## 2017-09-28 NOTE — Telephone Encounter (Signed)
Spoke to [employer] at [facility]. He advised pt has a loop recorder and can have MRI.  Spoke to [place] at [facility]. She advised he can have MRI at [facility].  Magda Paganini, does he need MRI abd w/wo contrast?

## 2017-09-28 NOTE — Telephone Encounter (Signed)
Please contact MRI and find out if patient's ICD is MR compatible.   I have printed out copy of his serial number etc for you.  If possible he needs MRI abd for pancreatic lesions.

## 2017-09-29 ENCOUNTER — Other Ambulatory Visit: Payer: Self-pay

## 2017-09-29 DIAGNOSIS — K869 Disease of pancreas, unspecified: Secondary | ICD-10-CM

## 2017-09-29 NOTE — Telephone Encounter (Signed)
Spoke to [place] at [facility], she advised to order MRI abdomen w/wo contrast.

## 2017-09-29 NOTE — Telephone Encounter (Signed)
Thanks for updating about MRI options in setting of loop recorder.   Please let patient's wife know that Dr. Thornton Papas, radiologist recommends MRI to better evaluate pancreatic lesions (as opposed to CT) and we will also update liver imaging for hepatoma screening at the same time.   -->Per radiologist at time of CTA reading: Patient needs MRI/MRCP to evaluate pancreatic lesions, I also need liver hepatoma screening BUT this study should be done in 12/2017 for one year follow up.   -->Please let wife know that patient will have follow up CTA to evaluate aneurysm with Dr. Cyndia Bent in 03/2018.

## 2017-09-29 NOTE — Telephone Encounter (Signed)
Pt's wife called office. Ok to schedule MRI. MRI scheduled for 10/06/17 at 8:00am, arrive at 7:45am. NPO after midnight.  Noted message from LSL after MRI was scheduled. Spoke to LSL. Pt doesn't need MRI at this time. Will need MRI/MRCP in 12/2017. Called Central Scheduling and cancelled MRI. Called pt's wife back and informed her of LSL's recommendation.  Prats, please nic MRI/MRCP to evaluate pancreatic lesions and liver hepatoma screening in 12/2017.

## 2017-09-29 NOTE — Progress Notes (Signed)
Discussed with Dr. Thornton Papas. Patient should have MRI abd if a candidate. He has loop recorder which is MRI compatible. See separate telephone note. Plan for MRI to evaluate pancreas and liver in 12/2017. Patient will have follow up CTA for aneurysm with Dr. Cyndia Bent as planned in 03/2018.

## 2017-09-29 NOTE — Telephone Encounter (Signed)
Tried to call pt's wife, no answer, unable to leave VM d/t mailbox is full.

## 2017-10-06 ENCOUNTER — Ambulatory Visit (HOSPITAL_COMMUNITY): Payer: Medicare Other

## 2017-10-07 ENCOUNTER — Ambulatory Visit (INDEPENDENT_AMBULATORY_CARE_PROVIDER_SITE_OTHER): Payer: Medicare Other | Admitting: *Deleted

## 2017-10-07 DIAGNOSIS — I6310 Cerebral infarction due to embolism of unspecified precerebral artery: Secondary | ICD-10-CM

## 2017-10-08 NOTE — Progress Notes (Signed)
Carelink Summary Report / Loop Recorder 

## 2017-10-13 DIAGNOSIS — Z87820 Personal history of traumatic brain injury: Secondary | ICD-10-CM | POA: Diagnosis not present

## 2017-10-13 DIAGNOSIS — Z0001 Encounter for general adult medical examination with abnormal findings: Secondary | ICD-10-CM | POA: Diagnosis not present

## 2017-10-13 DIAGNOSIS — Z1331 Encounter for screening for depression: Secondary | ICD-10-CM | POA: Diagnosis not present

## 2017-10-13 DIAGNOSIS — Z1389 Encounter for screening for other disorder: Secondary | ICD-10-CM | POA: Diagnosis not present

## 2017-10-13 DIAGNOSIS — Z720 Tobacco use: Secondary | ICD-10-CM | POA: Diagnosis not present

## 2017-10-13 DIAGNOSIS — G459 Transient cerebral ischemic attack, unspecified: Secondary | ICD-10-CM | POA: Diagnosis not present

## 2017-10-15 LAB — CUP PACEART REMOTE DEVICE CHECK
Date Time Interrogation Session: 20190308183653
Implantable Pulse Generator Implant Date: 20160518

## 2017-10-22 ENCOUNTER — Telehealth: Payer: Self-pay | Admitting: Internal Medicine

## 2017-10-22 NOTE — Telephone Encounter (Signed)
RECALL FOR CT PANCREATIC PROTOCOL °

## 2017-10-27 NOTE — Telephone Encounter (Signed)
Per LSL, pt doesn't CT, he will get MRI/MRCP in 12/2017.

## 2017-10-28 ENCOUNTER — Other Ambulatory Visit: Payer: Self-pay | Admitting: *Deleted

## 2017-10-28 NOTE — Patient Outreach (Signed)
[place] [facility]) Care Management  10/28/2017  MARK HASSEY [birth year] [id number]  Referral from [place]; needs social assistance; wants help with handicap ramp:  Telephone call to patient; spouse took call. States she is patient's Press photographer. States patient has short term memory loss due to history of numerous strokes.  States she is a Marine scientist and she and daughter care for patient. States she transports patient to healthcare appointments as scheduled. Voices that they manage patient's medications and make sure he is taking medications as prescribed by his doctors. States they assist patient with ADL's because of deficits caused by strokes.  States patient uses walker to ambulate.   States major concern now is patient needs ramp because current ramp was installed improperly and is a hazard to patient. States the ramp and porch are uneven. Voices that ramp was installed by [place] but they are unable to repair because benefit is not available due to disability is not service related.   States she needs community resources that can help her get a safe ramp for patient.  Spouse/caregiver agrees to [facility] services of [place] representative. States all other needs of patient are being handled.  Plan: Refer to Clinical Education officer, museum for [employer].  Sherrin Daisy, RN BSN [place] Management Coordinator [facility] Care Management  [phone]

## 2017-11-02 DIAGNOSIS — M25531 Pain in right wrist: Secondary | ICD-10-CM | POA: Diagnosis not present

## 2017-11-02 DIAGNOSIS — G894 Chronic pain syndrome: Secondary | ICD-10-CM | POA: Diagnosis not present

## 2017-11-02 DIAGNOSIS — G890 Central pain syndrome: Secondary | ICD-10-CM | POA: Diagnosis not present

## 2017-11-03 ENCOUNTER — Other Ambulatory Visit: Payer: Self-pay

## 2017-11-03 NOTE — Patient Outreach (Signed)
[place] [facility]) Care Management  11/03/2017  Eric Tran 20-Mar-1951 [id number]   Social work referral for assistance with ramp repair received on 10/29/17.  BSW gathered resources to share with patient.  BSW spoke with patient's spouse due to reported short term memory loss for patient.  BSW informed her of the following resources:  [place], [place].  BSW spoke with Sharol Roussel at [facility].  She reported that they are at the end of the budget year and will not have more funding until 12/28/17.  BSW informed spouse that referral can be submitted to [facility] at that time if other resources do not work out.  Spouse gave BSW permission to submit referral to Vocational Rehabilitation.  BSW submitted referral and Administrative Assistant reported that patient will be contacted to schedule an in-home assessment.  BSW mailed resources to patient along with [facility].  BSW will follow up within the next two weeks to ensure receipt of resources and scheduling of assessment with Vocational Rehabilitation.   Ronn Melena, BSW Social Worker [phone]

## 2017-11-09 ENCOUNTER — Ambulatory Visit (INDEPENDENT_AMBULATORY_CARE_PROVIDER_SITE_OTHER): Payer: Medicare Other | Admitting: *Deleted

## 2017-11-09 DIAGNOSIS — I6310 Cerebral infarction due to embolism of unspecified precerebral artery: Secondary | ICD-10-CM | POA: Diagnosis not present

## 2017-11-09 LAB — CUP PACEART REMOTE DEVICE CHECK
Date Time Interrogation Session: 20190410193931
Implantable Pulse Generator Implant Date: 20160518

## 2017-11-10 NOTE — Progress Notes (Signed)
Carelink Summary Report / Loop Recorder 

## 2017-11-16 ENCOUNTER — Other Ambulatory Visit: Payer: Self-pay

## 2017-11-16 ENCOUNTER — Ambulatory Visit: Payer: Self-pay

## 2017-11-16 NOTE — Patient Outreach (Signed)
[facility] [facility]) Care Management  11/16/2017  Eric Tran 09/25/1950 [id number]  BSW attempted to reach patient's spouse to follow up on ramp resources provided and referral made to Vocational Rehabilitation.  BSW left voicemail message.  BSW will attempt to follow up again within 3-4 business days.  Unsuccessful outreach letter mailed.   Ronn Melena, BSW Social Worker [phone]

## 2017-11-19 ENCOUNTER — Other Ambulatory Visit: Payer: Self-pay

## 2017-11-19 NOTE — Patient Outreach (Signed)
[facility] [facility]) Care Management  11/19/2017  MUSCAB BRENNEMAN [birth year] [id number]   BSW spoke with patient's spouse, April Jock regarding ramp resources provided.  Mrs. Sison confirmed that resources were received.  Mrs. Minjares acknowledged that she may have received a call from [place] regarding the referral but she often screens her calls and does not answer if she does not recognize the number.  Mrs. Rennaker was provided with the contact number in order to call about status of referral.  BSW will follow up again within the next couple of weeks to get an update on this referral for ramp repair.    Ronn Melena, BSW Social Worker [phone]

## 2017-11-30 ENCOUNTER — Ambulatory Visit: Payer: Self-pay

## 2017-12-01 ENCOUNTER — Other Ambulatory Visit: Payer: Self-pay

## 2017-12-01 NOTE — Patient Outreach (Signed)
[place] [facility]) Care Management  12/01/2017  Eric Tran [birth year] [id number]   BSW made follow up call to patient's spouse, April Jawad.  Per Mrs. Lazenby she called a couple of the resources provided (she could not remember which ones) and has not received a return phone call.  BSW informed Mrs. Kowalke that a follow up call would be made to Vocational Rehabilitation in [place] to determine the status of the referral submitted on 11/03/17. BSW spoke with Johnn Hai at [facility] and she reported that she never received the referral information for patient.  Referral information was provided again and Anderson Malta reported that patient would be bumped up to the top of the list since there has already been a delay.  Anderson Malta said that she will call the patient as soon as possible to gather more information and schedule the home assessment if appropriate.   Anderson Malta agreed to follow up with BSW when a decision has been made about assistance for patient. BSW called Mrs. Desroches back to inform her about the delay of this referral and that she should be contacted by Anderson Malta at [employer].  BSW will follow up with Mrs. Mckenny within the next two weeks if no contact from [place].  Ronn Melena, BSW Social Worker [phone]

## 2017-12-02 LAB — CUP PACEART REMOTE DEVICE CHECK
Date Time Interrogation Session: 20190513194038
Implantable Pulse Generator Implant Date: 20160518

## 2017-12-03 ENCOUNTER — Telehealth: Payer: Self-pay | Admitting: Internal Medicine

## 2017-12-03 NOTE — Telephone Encounter (Signed)
RECALL FOR MRI/MRCP  

## 2017-12-03 NOTE — Telephone Encounter (Signed)
Letter mailed

## 2017-12-07 DIAGNOSIS — J42 Unspecified chronic bronchitis: Secondary | ICD-10-CM | POA: Diagnosis not present

## 2017-12-07 DIAGNOSIS — Z981 Arthrodesis status: Secondary | ICD-10-CM | POA: Diagnosis not present

## 2017-12-07 DIAGNOSIS — Z7902 Long term (current) use of antithrombotics/antiplatelets: Secondary | ICD-10-CM | POA: Diagnosis not present

## 2017-12-07 DIAGNOSIS — Z79899 Other long term (current) drug therapy: Secondary | ICD-10-CM | POA: Diagnosis not present

## 2017-12-07 DIAGNOSIS — Z8673 Personal history of transient ischemic attack (TIA), and cerebral infarction without residual deficits: Secondary | ICD-10-CM | POA: Diagnosis not present

## 2017-12-07 DIAGNOSIS — R410 Disorientation, unspecified: Secondary | ICD-10-CM | POA: Diagnosis not present

## 2017-12-07 DIAGNOSIS — Z720 Tobacco use: Secondary | ICD-10-CM | POA: Diagnosis not present

## 2017-12-07 DIAGNOSIS — I10 Essential (primary) hypertension: Secondary | ICD-10-CM | POA: Diagnosis not present

## 2017-12-07 DIAGNOSIS — R918 Other nonspecific abnormal finding of lung field: Secondary | ICD-10-CM | POA: Diagnosis not present

## 2017-12-14 ENCOUNTER — Other Ambulatory Visit: Payer: Self-pay

## 2017-12-14 ENCOUNTER — Ambulatory Visit (INDEPENDENT_AMBULATORY_CARE_PROVIDER_SITE_OTHER): Payer: Medicare Other | Admitting: *Deleted

## 2017-12-14 DIAGNOSIS — I6310 Cerebral infarction due to embolism of unspecified precerebral artery: Secondary | ICD-10-CM | POA: Diagnosis not present

## 2017-12-14 NOTE — Progress Notes (Signed)
Carelink Summary Report / Loop Recorder 

## 2017-12-14 NOTE — Patient Outreach (Signed)
[place] [facility]) Care Management  12/14/2017  Eric Tran [birth year] [id number]  Outreach call to patient's spouse, Eric Tran, to follow up on the referral made to [name] for assistance with repairing ramp at their home.  Per Mrs. Montesinos, she was contacted by Johnn Hai at [facility] and she came to their home to complete an assessment approximately one week ago.  Per Mrs. Benna Dunks informed her that some additional information such as medical records needs to be gathered and then they will follow up with her regarding his eligibility for assistance.  BSW will follow up with Mrs. Spragg again within the next two weeks to get an update.    Ronn Melena, BSW Social Worker [phone]

## 2017-12-18 ENCOUNTER — Telehealth: Payer: Self-pay | Admitting: *Deleted

## 2017-12-18 NOTE — Telephone Encounter (Signed)
Spoke with patient's wife, Eric Tran ([place]), regarding [facility] at [facility] as of 12/08/17.  She will plan to discuss options with patient and call us back next week as she is not home right now.  Patient is unavailable to speak with due to memory loss per wife (he does not remember that he has a loop recorder).  Patient had multiple unwitnessed falls last week, no injury noted per wife.  She clarifies that he has not been assessed by MD, but states she is an Therapist, sports and patient has remained oriented at his baseline since the falls.  Patient was seen at [facility] ED prior to these falls for AMS and seizures.  Mental status has continued to improve toward baseline since d/c.  She reports he has an upcoming neuro appointment.  Encouraged wife to contact PCP office regarding the falls, even though he seems at his baseline (on ASA and Plavix).  Also advised ED f/u for new or worsening symptoms, including mental status changes.   Patient's wife reports she is under a lot of stress.  She reports they are trying to manage patient's multiple chronic conditions. Patient's biggest issue currently is depression per his wife.  She is working with the [place] and his PCP to try to find counseling services that they can afford.  She states that he has not expressed suicidal or homicidal ideation, but "he is just depressed".  Encouraged wife to contact PCP for further recommendations.  She verbalizes understanding and denies additional questions at this time.

## 2017-12-28 ENCOUNTER — Other Ambulatory Visit: Payer: Self-pay

## 2017-12-28 NOTE — Patient Outreach (Addendum)
[place] [facility]) Care Management  12/28/2017  KAMARRION STFORT 1951/03/29 [id number]  BSW spoke with patient's spouse, April Rasheed, regarding the status of the referral to Vocational Rehabilitation for assistance with building a ramp.  The home assessment has already occurred and, per Mrs. Milks, she received a follow up letter in the mail informing her that the referral is still being processed.  Per Mrs. Erline Levine, she was informed by Johnn Hai at [place] that the referral process takes some time. BSW left voicemail message for [name] regarding the status of this referral.  BSW will follow up with Mrs. Cossin again within the next month.    Follow up entry: Received return phone call from [name] at [employer].  She reported that she has received all of the necessary medical records including documentation of why the [facility] cannot assist with the ramp.  Per Ms. Norville Haggard, Mr. Pinsky is over the income limit to qualify for their assistance but it was reported to her that they are paying Mrs. Kimm's daughter out of pocket to provide some in home care for Mr.Hafford.  Ms. Norville Haggard has requested that Mrs. Steil provide a statement to her regarding this out of pocket expense. She is going to follow up with Mrs. Kuennen about this once she has had time to review all medical documentation.    Ronn Melena, BSW Social Worker [phone]

## 2018-01-04 ENCOUNTER — Telehealth: Payer: Self-pay | Admitting: Internal Medicine

## 2018-01-04 NOTE — Telephone Encounter (Signed)
RECALL FOR REPEAT CT

## 2018-01-04 NOTE — Telephone Encounter (Signed)
Recall letter mailed for hepatoma screening per LSL phone note 09/29/17.

## 2018-01-04 NOTE — Telephone Encounter (Signed)
Pt needs MRI/MRCP as opposed to CT per phone note by LSL 09/29/17. Recall letter already mailed.

## 2018-01-06 DIAGNOSIS — G890 Central pain syndrome: Secondary | ICD-10-CM | POA: Diagnosis not present

## 2018-01-06 DIAGNOSIS — G894 Chronic pain syndrome: Secondary | ICD-10-CM | POA: Diagnosis not present

## 2018-01-06 DIAGNOSIS — M25531 Pain in right wrist: Secondary | ICD-10-CM | POA: Diagnosis not present

## 2018-01-08 NOTE — Telephone Encounter (Signed)
Spoke with patient's wife.  She reports patient wishes to have ILR explanted. Scheduled with Dr. Rayann Heman on 02/17/18 at 3:15pm to discuss.  Unenrolled from [place], return kit ordered to home address on file.

## 2018-01-16 LAB — CUP PACEART REMOTE DEVICE CHECK
Date Time Interrogation Session: 20190615200615
Implantable Pulse Generator Implant Date: 20160518

## 2018-01-18 DIAGNOSIS — J449 Chronic obstructive pulmonary disease, unspecified: Secondary | ICD-10-CM | POA: Diagnosis not present

## 2018-01-18 DIAGNOSIS — Z720 Tobacco use: Secondary | ICD-10-CM | POA: Diagnosis not present

## 2018-01-18 DIAGNOSIS — Z87820 Personal history of traumatic brain injury: Secondary | ICD-10-CM | POA: Diagnosis not present

## 2018-01-18 DIAGNOSIS — G459 Transient cerebral ischemic attack, unspecified: Secondary | ICD-10-CM | POA: Diagnosis not present

## 2018-01-22 DIAGNOSIS — R892 Abnormal level of other drugs, medicaments and biological substances in specimens from other organs, systems and tissues: Secondary | ICD-10-CM | POA: Diagnosis not present

## 2018-01-22 DIAGNOSIS — Z79899 Other long term (current) drug therapy: Secondary | ICD-10-CM | POA: Diagnosis not present

## 2018-01-22 DIAGNOSIS — I10 Essential (primary) hypertension: Secondary | ICD-10-CM | POA: Diagnosis not present

## 2018-01-22 DIAGNOSIS — Z8673 Personal history of transient ischemic attack (TIA), and cerebral infarction without residual deficits: Secondary | ICD-10-CM | POA: Diagnosis not present

## 2018-01-22 DIAGNOSIS — R569 Unspecified convulsions: Secondary | ICD-10-CM | POA: Diagnosis not present

## 2018-01-22 DIAGNOSIS — Z7902 Long term (current) use of antithrombotics/antiplatelets: Secondary | ICD-10-CM | POA: Diagnosis not present

## 2018-01-22 DIAGNOSIS — M1990 Unspecified osteoarthritis, unspecified site: Secondary | ICD-10-CM | POA: Diagnosis not present

## 2018-01-22 DIAGNOSIS — J449 Chronic obstructive pulmonary disease, unspecified: Secondary | ICD-10-CM | POA: Diagnosis not present

## 2018-01-22 DIAGNOSIS — E0590 Thyrotoxicosis, unspecified without thyrotoxic crisis or storm: Secondary | ICD-10-CM | POA: Diagnosis not present

## 2018-01-26 ENCOUNTER — Ambulatory Visit: Payer: Medicare Other | Admitting: Adult Health

## 2018-01-29 ENCOUNTER — Other Ambulatory Visit: Payer: Self-pay

## 2018-01-29 ENCOUNTER — Telehealth: Payer: Self-pay

## 2018-01-29 DIAGNOSIS — K869 Disease of pancreas, unspecified: Secondary | ICD-10-CM

## 2018-01-29 NOTE — Telephone Encounter (Signed)
Pt's wife had called office, received recall letter for MRI/MRCP. Per LSL pt needs MRI/MRCP, no need for [place]. Wife aware.  MRI/MRCP scheduled for 02/08/18 at 10:00am, arrive at 9:30am. NPO 4 hours prior to test. Called wife and informed her of appt. Letter mailed.

## 2018-01-29 NOTE — Telephone Encounter (Signed)
No PA needed for MRI/MRCP per [facility] website.

## 2018-02-01 ENCOUNTER — Other Ambulatory Visit: Payer: Self-pay

## 2018-02-01 ENCOUNTER — Ambulatory Visit: Payer: Self-pay

## 2018-02-01 NOTE — Patient Outreach (Signed)
[place] [facility]) Care Management  02/01/2018  Eric Tran [birth year] [id number]   Follow up call to patient's spouse, April Gutierres, regarding status of ramp referral to Vocational Rehabilitation.   BSW left voicemail message.  Will attempt to contact Mrs. Finigan again within four business days.  Ronn Melena, BSW Social Worker [phone]

## 2018-02-04 ENCOUNTER — Other Ambulatory Visit: Payer: Self-pay

## 2018-02-04 NOTE — Patient Outreach (Signed)
[facility]) Care Management  02/04/2018  Eric Tran [birth year] [id number]  Follow up call to patient's spouse, Eric Tran, regarding status of referral for ramp assistance.  Per [name], she has some additional paperwork that needs to be completed and sent to [name] at [employer].  She reported that a friend gave her contact information just yesterday for a volunteer with the [employer] that may be willing to build the ramp. BSW will follow up again next week regarding the status and [name] agreed to call if there are any updates between now and then.  Ronn Melena, BSW Social Worker [phone]

## 2018-02-08 ENCOUNTER — Ambulatory Visit (HOSPITAL_COMMUNITY)
Admission: RE | Admit: 2018-02-08 | Discharge: 2018-02-08 | Disposition: A | Discharge status: Home / Self Care | Payer: Medicare Other | Source: Ambulatory Visit | Attending: Gastroenterology | Admitting: Gastroenterology

## 2018-02-08 ENCOUNTER — Other Ambulatory Visit: Payer: Self-pay | Admitting: Gastroenterology

## 2018-02-08 DIAGNOSIS — K862 Cyst of pancreas: Secondary | ICD-10-CM | POA: Diagnosis not present

## 2018-02-08 DIAGNOSIS — K869 Disease of pancreas, unspecified: Secondary | ICD-10-CM | POA: Diagnosis not present

## 2018-02-08 DIAGNOSIS — I700 Atherosclerosis of aorta: Secondary | ICD-10-CM | POA: Diagnosis not present

## 2018-02-08 DIAGNOSIS — R932 Abnormal findings on diagnostic imaging of liver and biliary tract: Secondary | ICD-10-CM | POA: Diagnosis not present

## 2018-02-08 LAB — POCT I-STAT CREATININE: Creatinine, Ser: 0.9 mg/dL (ref 0.61–1.24)

## 2018-02-08 MED ORDER — GADOBENATE DIMEGLUMINE 529 MG/ML IV SOLN
10.0000 mL | Freq: Once | INTRAVENOUS | Status: AC | PRN
  Administered 2018-02-08: 10 mL via INTRAVENOUS

## 2018-02-09 ENCOUNTER — Other Ambulatory Visit: Payer: Self-pay | Admitting: *Deleted

## 2018-02-09 ENCOUNTER — Telehealth: Payer: Self-pay | Admitting: *Deleted

## 2018-02-09 ENCOUNTER — Encounter: Payer: Self-pay | Admitting: *Deleted

## 2018-02-09 ENCOUNTER — Encounter: Payer: Self-pay | Admitting: Gastroenterology

## 2018-02-09 ENCOUNTER — Ambulatory Visit (INDEPENDENT_AMBULATORY_CARE_PROVIDER_SITE_OTHER): Payer: Medicare Other | Admitting: Gastroenterology

## 2018-02-09 VITALS — BP 132/93 | HR 74 | Temp 96.7°F | Ht 68.0 in | Wt 116.6 lb

## 2018-02-09 DIAGNOSIS — K869 Disease of pancreas, unspecified: Secondary | ICD-10-CM | POA: Diagnosis not present

## 2018-02-09 DIAGNOSIS — R634 Abnormal weight loss: Secondary | ICD-10-CM

## 2018-02-09 DIAGNOSIS — K7460 Unspecified cirrhosis of liver: Secondary | ICD-10-CM

## 2018-02-09 DIAGNOSIS — R6881 Early satiety: Secondary | ICD-10-CM

## 2018-02-09 DIAGNOSIS — R630 Anorexia: Secondary | ICD-10-CM | POA: Diagnosis not present

## 2018-02-09 NOTE — Progress Notes (Signed)
cc'd to pcp 

## 2018-02-09 NOTE — Telephone Encounter (Signed)
Pre-op scheduled for 03/17/18 at 12:45pm. Letter mailed. Called # listed, NA and VM is full.

## 2018-02-09 NOTE — Patient Instructions (Signed)
1. Upper endoscopy as scheduled. See separate instructions.  2. Please call with dose of Megace and we can provide you refills.

## 2018-02-09 NOTE — Progress Notes (Signed)
Primary Care Physician:  Rosine Door  Primary Gastroenterologist:  Garfield Cornea, MD   Chief Complaint  Patient presents with  . Weight Loss    HPI:  Eric Tran is a 67 y.o. male here for follow-up.  Last seen in March.  He has a history of Hepatitis C cirrhosis, hepatitis C was eradicated.  History of failure to thrive, unintentional weight loss with extensive evaluation in the past.  History of multiple strokes.  Patient completed Cologuard testing in December 2017 (because of poor prep twice and no desire to attempt prepped again on the patient's part) which was negative. Plan to repeat Cologuard November 2020.  He is due for EGD in September 2019 for esophageal variceal screening.  Currently up-to-date on hepatoma screening.  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  Liver reported unremarkable.  Spleen normal in size.  Right renal cortical thinning and scarring.  Exam is somewhat limited by respiratory motion.  Out of Megace for awhile. Weight is down 14 pounds since 08/2017. Current weight of 116 pounds is his lowest documented weight in Epic going back to 2012. According to his wife, weight dropped mostly since 11/2017 when he started having seizures really bad. He took a tramadol given to him by his brother while his wife stepped out for errands. He developed seizures after this. Has had two ED visits. In July started noticing weight loss. Seizures calmed down for passed couple of weeks.    BM QOD. Dulcolax two per day. miralax prn only. Poor appetite. Feels full with couple of bites. No N/V. No abdominal pain. No GERD or dysphagia.   He feels like his pain is poorly controlled. According to his wife, his pain medication and color changed but he is received the same amount of oxycodone but due to patient's dementia he thinks he is getting less due to the color change of his  pills. She has had hard time with this.   She would like to get him back on megace but not having any luck getting PCP to respond to request for RX ([facility]).   Current Outpatient Medications  Medication Sig Dispense Refill  . albuterol (PROVENTIL HFA;VENTOLIN HFA) 108 (90 Base) MCG/ACT inhaler Inhale 2 puffs into the lungs every 6 (six) hours as needed for wheezing or shortness of breath. 1 Inhaler 2  . ALPRAZolam (XANAX) 0.25 MG tablet Take 0.25 mg by mouth 3 (three) times daily as needed for anxiety.    . ASPIRIN 81 PO Take by mouth daily.    . Cholecalciferol (VITAMIN D) 2000 units CAPS Take by mouth daily.    . clopidogrel (PLAVIX) 75 MG tablet Take 75 mg by mouth daily. Reported on 10/03/2015    . divalproex (DEPAKOTE) 500 MG DR tablet Take 1 tablet (500 mg total) by mouth 2 (two) times daily. 180 tablet 3  . docusate sodium (COLACE) 100 MG capsule Take 100 mg by mouth 2 (two) times daily.     . DULoxetine (CYMBALTA) 60 MG capsule Take 90 mg by mouth daily.     . fluticasone furoate-vilanterol (BREO ELLIPTA) 100-25 MCG/INH AEPB Inhale 1 puff into the lungs 2 (two) times daily.     Marland Kitchen gabapentin (NEURONTIN) 300 MG capsule Take 600 mg by mouth 3 (three) times daily.    . hydrALAZINE (APRESOLINE) 25 MG tablet Take 25 mg by mouth 2 (two) times daily.     Marland Kitchen  lisinopril (PRINIVIL,ZESTRIL) 40 MG tablet Take 40 mg by mouth daily.    . methimazole (TAPAZOLE) 5 MG tablet Take 10 mg by mouth every 4 (four) hours.     Derrill Memo ON 03/01/2018] Oxycodone HCl 10 MG TABS Take 1 tablet by mouth every 4 (four) hours as needed.    . polyethylene glycol powder (GLYCOLAX/MIRALAX) powder Take one capful every evening on days you do not have a good bowel movement. 527 g 5  . Tamsulosin HCl (FLOMAX) 0.4 MG CAPS Take 0.4 mg by mouth every evening.     . traZODone (DESYREL) 100 MG tablet Take 1 tablet (100 mg total) by mouth at bedtime. (Patient taking differently: Take 100 mg by mouth as needed. ) 30 tablet 3  .  Megestrol Acetate (MEGACE ORAL PO) Take 1 tablet by mouth daily.     No current facility-administered medications for this visit.     Allergies as of 02/09/2018 - Review Complete 02/09/2018  Allergen Reaction Noted  . Keppra [levetiracetam] Other (See Comments) 10/03/2014  . Adhesive [tape]  11/15/2014  . Methadone  10/02/2016  . Morphine and related Nausea And Vomiting 05/01/2011  . Tramadol  02/09/2018    Past Medical History:  Diagnosis Date  . Abdominal aortic aneurysm ([place])   . Abnormality of gait 10/03/2014  . Anxiety   . Arthritis   . BPH (benign prostatic hyperplasia)   . Carpal tunnel syndrome   . Cerebrovascular disease 10/03/2014  . Chronic back pain    chronic narcotic use  . Chronic hepatitis C ([place]) 1980   Harvoni 2016, cured. HCV RNA negative 01/2016  . Chronic pain   . Degenerative disc disease   . Depression   . Head trauma   . Hepatic cirrhosis ([place]) 02/19/2016  . Hypertension   . Hyperthyroidism   . Memory difficulties 10/03/2014  . Partial complex seizure disorder without intractable epilepsy ([place]) 10/03/2014  . Seizures ([place])    Epilepsy  . Stroke [facility]) 2012   left hemiplegia  . TIA (transient ischemic attack)   . Tremor   . Tremor, essential 10/02/2016  . UTI (lower urinary tract infection)     Past Surgical History:  Procedure Laterality Date  . EP IMPLANTABLE DEVICE N/A 11/15/2014   Procedure: Loop Recorder Insertion;  Surgeon: Thompson Grayer, MD;  Location: [place] [facility];  Service: Cardiovascular;  Laterality: N/A;  . ESOPHAGOGASTRODUODENOSCOPY (EGD) WITH PROPOFOL N/A 03/20/2016   Procedure: ESOPHAGOGASTRODUODENOSCOPY (EGD) WITH PROPOFOL;  Surgeon: Daneil Dolin, MD;  [facility];  Service: Endoscopy;  Laterality: N/A;  . HAND SURGERY     right  . NECK SURGERY    . right hand reconstruction      Family History  Problem Relation Age of Onset  . Cervical cancer Mother   . Heart failure Father   . Lung cancer Sister   . Breast  cancer Sister   . Colon cancer Neg Hx   . Colon polyps Neg Hx     Social History   Socioeconomic History  . Marital status: Married    Spouse name: Not on file  . Number of children: 2  . Years of education: GED  . Highest education level: Not on file  Occupational History  . Occupation: unemployed    Fish farm manager: UNEMPLOYED  Social Needs  . Financial resource strain: Not on file  . Food insecurity:    Worry: Not on file    Inability: Not on file  . Transportation needs:  Medical: Not on file    Non-medical: Not on file  Tobacco Use  . Smoking status: Current Every Day Smoker    Packs/day: 1.00    Years: 45.00    Pack years: 45.00    Types: Cigarettes    Start date: 07/18/1962  . Smokeless tobacco: Never Used  Substance and Sexual Activity  . Alcohol use: No    Alcohol/week: 0.0 standard drinks    Comment: occasionally  . Drug use: No    Types: Marijuana    Comment: prior cocaine/ marijuana  . Sexual activity: Yes    Partners: Female    Comment: hx multiple sexual partners  Lifestyle  . Physical activity:    Days per week: Not on file    Minutes per session: Not on file  . Stress: Not on file  Relationships  . Social connections:    Talks on phone: Not on file    Gets together: Not on file    Attends religious service: Not on file    Active member of club or organization: Not on file    Attends meetings of clubs or organizations: Not on file    Relationship status: Not on file  . Intimate partner violence:    Fear of current or ex partner: Not on file    Emotionally abused: Not on file    Physically abused: Not on file    Forced sexual activity: Not on file  Other Topics Concern  . Not on file  Social History Narrative   1 daughter committed suicide 2008   Patient right handed.   Patient drinks about 6-7 cups of caffeine daily.         ROS: reliability questionable.   General: + for anorexia, weight loss,  fatigue, weakness. Eyes: Negative for  vision changes.  ENT: Negative for hoarseness, difficulty swallowing , nasal congestion. CV: Negative for chest pain, angina, palpitations, dyspnea on exertion, peripheral edema.  Respiratory: Negative for dyspnea at rest, dyspnea on exertion, cough, sputum, wheezing.  GI: See history of present illness. GU:  Negative for dysuria, hematuria, urinary incontinence, urinary frequency, nocturnal urination.  MS: chronic pain Derm: Negative for rash or itching.  Neuro: Negative for weakness, abnormal sensation,  frequent headaches. +seizures,  memory loss, confusion.  Psych: Negative for anxiety, depression, suicidal ideation, hallucinations.  Endo: see hpi Heme: Negative for bruising or bleeding. Allergy: Negative for rash or hives.    Physical Examination:  BP (!) 132/93   Pulse 74   Temp (!) 96.7 F (35.9 C) (Oral)   Ht 5\' 8"  (1.727 m)   Wt 116 lb 9.6 oz (52.9 kg)   BMI 17.73 kg/m    General: thin, male, appears older than stated age, in no acute distress. Answers some questions, short answers. Frail appearing. Head: Normocephalic, atraumatic.   Eyes: Conjunctiva pink, no icterus. Mouth: Oropharyngeal mucosa moist and pink , no lesions erythema or exudate. Neck: Supple without thyromegaly, masses, or lymphadenopathy.  Lungs: Clear to auscultation bilaterally.  Heart: Regular rate and rhythm, no murmurs rubs or gallops.  Abdomen: Bowel sounds are normal, nontender, nondistended, no hepatosplenomegaly or masses, no abdominal bruits or hernia , no rebound or guarding.   Rectal: not performed Extremities: No lower extremity edema. No clubbing or deformities.  Neuro: Alert and oriented x person, place, grossly normal neurologically.  Skin: Warm and dry, no rash or jaundice.   Psych: Alert and cooperative, flat affect.   Labs: Lab Results  Component Value Date  CREATININE 0.90 02/08/2018   BUN 16 07/06/2017   NA 137 07/06/2017   K 4.0 07/06/2017   CL 100 07/06/2017   CO2 31  07/06/2017   Lab Results  Component Value Date   ALT 7 (L) 07/06/2017   AST 11 07/06/2017   ALKPHOS 40 02/24/2017   BILITOT 0.8 07/06/2017   Lab Results  Component Value Date   WBC 5.9 07/06/2017   HGB 15.5 07/06/2017   HCT 43.5 07/06/2017   MCV 88.4 07/06/2017   PLT 214 07/06/2017   Lab Results  Component Value Date   INR 1.0 07/06/2017   INR 1.1 02/19/2016   INR 0.92 05/01/2011     Imaging Studies: Mr 3d Recon At Scanner  Result Date: 02/08/2018 CLINICAL DATA:  Follow-up cystic pancreatic lesion. No history of malignancy or prior relevant surgery. EXAM: MRI ABDOMEN WITHOUT AND WITH CONTRAST (INCLUDING MRCP) TECHNIQUE: Multiplanar multisequence MR imaging of the abdomen was performed both before and after the administration of intravenous contrast. Heavily T2-weighted images of the biliary and pancreatic ducts were obtained, and three-dimensional MRCP images were rendered by post processing. CONTRAST:  12mL MULTIHANCE GADOBENATE DIMEGLUMINE 529 MG/ML IV SOLN COMPARISON:  Abdominal MRI 05/09/2011, abdominal ultrasound 07/06/2017, chest CT 12/07/2017 and abdominal CT 01/12/2017. FINDINGS: Despite efforts by the technologist and patient, mild motion artifact is present on today's exam and could not be eliminated. This reduces exam sensitivity and specificity. Lower chest:  The visualized lower chest appears unremarkable. Hepatobiliary: The liver is normal in signal without focal lesion or abnormal enhancement. There is no significant biliary dilatation. The gallbladder appears normal. Pancreas: There is no evidence pancreatic mass, ductal dilatation or surrounding inflammation. There is a stable 5 mm lipoma in the pancreatic tail. No abnormal enhancement following contrast. The pancreatic ductal anatomy is normal. Spleen: Normal in size without focal abnormality. Adrenals/Urinary Tract: Both adrenal glands appear normal. There is right renal cortical scarring and thinning. Small renal cysts  are present bilaterally. No hydronephrosis. Stomach/Bowel: No evidence of bowel wall thickening, distention or surrounding inflammatory change. Vascular/Lymphatic: There are no enlarged abdominal lymph nodes. No acute vascular findings. Aortic and branch vessel atherosclerosis, better seen on CT. No acute vascular findings are evident. Other: No ascites. Musculoskeletal: No acute or significant osseous findings. IMPRESSION: 1. No suspicious pancreatic findings. 2. No acute abdominal findings. 3. Right renal cortical thinning and scarring. 4. Aortic and branch vessel atherosclerosis, better seen on previous CTA. Electronically Signed   By: Richardean Sale M.D.   On: 02/08/2018 11:17   Mr Abdomen Mrcp Moise Boring Contast  Result Date: 02/08/2018 CLINICAL DATA:  Follow-up cystic pancreatic lesion. No history of malignancy or prior relevant surgery. EXAM: MRI ABDOMEN WITHOUT AND WITH CONTRAST (INCLUDING MRCP) TECHNIQUE: Multiplanar multisequence MR imaging of the abdomen was performed both before and after the administration of intravenous contrast. Heavily T2-weighted images of the biliary and pancreatic ducts were obtained, and three-dimensional MRCP images were rendered by post processing. CONTRAST:  68mL MULTIHANCE GADOBENATE DIMEGLUMINE 529 MG/ML IV SOLN COMPARISON:  Abdominal MRI 05/09/2011, abdominal ultrasound 07/06/2017, chest CT 12/07/2017 and abdominal CT 01/12/2017. FINDINGS: Despite efforts by the technologist and patient, mild motion artifact is present on today's exam and could not be eliminated. This reduces exam sensitivity and specificity. Lower chest:  The visualized lower chest appears unremarkable. Hepatobiliary: The liver is normal in signal without focal lesion or abnormal enhancement. There is no significant biliary dilatation. The gallbladder appears normal. Pancreas: There is no evidence pancreatic mass, ductal  dilatation or surrounding inflammation. There is a stable 5 mm lipoma in the pancreatic  tail. No abnormal enhancement following contrast. The pancreatic ductal anatomy is normal. Spleen: Normal in size without focal abnormality. Adrenals/Urinary Tract: Both adrenal glands appear normal. There is right renal cortical scarring and thinning. Small renal cysts are present bilaterally. No hydronephrosis. Stomach/Bowel: No evidence of bowel wall thickening, distention or surrounding inflammatory change. Vascular/Lymphatic: There are no enlarged abdominal lymph nodes. No acute vascular findings. Aortic and branch vessel atherosclerosis, better seen on CT. No acute vascular findings are evident. Other: No ascites. Musculoskeletal: No acute or significant osseous findings. IMPRESSION: 1. No suspicious pancreatic findings. 2. No acute abdominal findings. 3. Right renal cortical thinning and scarring. 4. Aortic and branch vessel atherosclerosis, better seen on previous CTA. Electronically Signed   By: Richardean Sale M.D.   On: 02/08/2018 11:17

## 2018-02-09 NOTE — Assessment & Plan Note (Signed)
Likely multifactorial. Extensive evaluation in the past. Resume megace once wife let's me know what dose he was on before. It seem to help stabilize his weight loss and improve appetite. Plan for EGD in near future to evaluate his early satiety.

## 2018-02-09 NOTE — Assessment & Plan Note (Signed)
As outlined above. MRI with pancreatic tail lipoma. CT previously with pancreatic body lesions. Will follow up on CTA planned for this fall with Dr. Cyndia Bent.

## 2018-02-09 NOTE — Assessment & Plan Note (Signed)
Being managed as cirrhosis given prior imaging with nodular liver boarder and EGD findings in 2017 suggestive of portal hypertension. More recent imaging without overt cirrhosis.   He is due labs at this time. Also for EGD for variceal screening with deep sedation.  I have discussed the risks, alternatives, benefits with regards to but not limited to the risk of reaction to medication, bleeding, infection, perforation and the patient is agreeable to proceed. Written consent to be obtained.

## 2018-02-10 NOTE — Progress Notes (Signed)
Discussed with Dr. Gala Romney. Plan for EGD with propofol for early satiety/anorexia and screen for esophageal varices. Given Plavix use, we will not plan for esophageal banding. He did not have varices in 2017 so likely would not have large varices to band.

## 2018-02-17 ENCOUNTER — Encounter: Payer: Self-pay | Admitting: Internal Medicine

## 2018-02-17 ENCOUNTER — Ambulatory Visit (INDEPENDENT_AMBULATORY_CARE_PROVIDER_SITE_OTHER): Payer: Medicare Other | Admitting: Internal Medicine

## 2018-02-17 ENCOUNTER — Other Ambulatory Visit: Payer: Self-pay | Admitting: Internal Medicine

## 2018-02-17 VITALS — BP 90/52 | HR 72 | Ht 68.0 in | Wt 119.8 lb

## 2018-02-17 DIAGNOSIS — I6310 Cerebral infarction due to embolism of unspecified precerebral artery: Secondary | ICD-10-CM

## 2018-02-17 DIAGNOSIS — Z8673 Personal history of transient ischemic attack (TIA), and cerebral infarction without residual deficits: Secondary | ICD-10-CM

## 2018-02-17 DIAGNOSIS — I517 Cardiomegaly: Secondary | ICD-10-CM

## 2018-02-17 HISTORY — PX: OTHER SURGICAL HISTORY: SHX169

## 2018-02-17 NOTE — Progress Notes (Signed)
PCP: Rosalee Kaufman, PA-C Primary Cardiologist: Eric Harl Bowie Primary EP: Eric Tran is a 67 y.o. male who presents today for routine electrophysiology followup.  Since last being seen in our clinic, the patient reports doing reasonably well.  He has had falls and seizures during which no arrhythmias were detected.  He has been monitored for over 3 years without arrhythmias observed.  His ILR is at EOS.  He therefore presents today for further discussion.  Today, he denies symptoms of palpitations, chest pain, shortness of breath,  lower extremity edema, dizziness, presyncope, or syncope.  The patient is otherwise without complaint today.   Past Medical History:  Diagnosis Date  . Abdominal aortic aneurysm ([place])   . Abnormality of gait 10/03/2014  . Anxiety   . Arthritis   . BPH (benign prostatic hyperplasia)   . Carpal tunnel syndrome   . Cerebrovascular disease 10/03/2014  . Chronic back pain    chronic narcotic use  . Chronic hepatitis C ([place]) 1980   Harvoni 2016, cured. HCV RNA negative 01/2016  . Chronic pain   . Degenerative disc disease   . Depression   . Head trauma   . Hepatic cirrhosis ([place]) 02/19/2016  . Hypertension   . Hyperthyroidism   . Memory difficulties 10/03/2014  . Partial complex seizure disorder without intractable epilepsy ([place]) 10/03/2014  . Seizures ([place])    Epilepsy  . Stroke [facility]) 2012   left hemiplegia  . TIA (transient ischemic attack)   . Tremor   . Tremor, essential 10/02/2016  . UTI (lower urinary tract infection)    Past Surgical History:  Procedure Laterality Date  . EP IMPLANTABLE DEVICE N/A 11/15/2014   Procedure: Loop Recorder Insertion;  Surgeon: Thompson Grayer, MD;  Location: [place] [facility];  Service: Cardiovascular;  Laterality: N/A;  . ESOPHAGOGASTRODUODENOSCOPY (EGD) WITH PROPOFOL N/A 03/20/2016   Procedure: ESOPHAGOGASTRODUODENOSCOPY (EGD) WITH PROPOFOL;  Surgeon: Daneil Dolin, MD;  [facility];  Service:  Endoscopy;  Laterality: N/A;  . HAND SURGERY     right  . NECK SURGERY    . right hand reconstruction      ROS- all systems are reviewed and negatives except as per HPI above  Current Outpatient Medications  Medication Sig Dispense Refill  . albuterol (PROVENTIL HFA;VENTOLIN HFA) 108 (90 Base) MCG/ACT inhaler Inhale 2 puffs into the lungs every 6 (six) hours as needed for wheezing or shortness of breath. 1 Inhaler 2  . ALPRAZolam (XANAX) 0.25 MG tablet Take 0.25 mg by mouth 3 (three) times daily as needed for anxiety.    . ASPIRIN 81 PO Take by mouth daily.    . Cholecalciferol (VITAMIN D) 2000 units CAPS Take by mouth daily.    . clopidogrel (PLAVIX) 75 MG tablet Take 75 mg by mouth daily. Reported on 10/03/2015    . divalproex (DEPAKOTE) 500 MG Eric tablet Take 1 tablet (500 mg total) by mouth 2 (two) times daily. 180 tablet 3  . docusate sodium (COLACE) 100 MG capsule Take 100 mg by mouth 2 (two) times daily.     . DULoxetine (CYMBALTA) 60 MG capsule Take 90 mg by mouth daily.     . fluticasone furoate-vilanterol (BREO ELLIPTA) 100-25 MCG/INH AEPB Inhale 1 puff into the lungs 2 (two) times daily.     Marland Kitchen gabapentin (NEURONTIN) 300 MG capsule Take 600 mg by mouth 3 (three) times daily.    . hydrALAZINE (APRESOLINE) 25 MG tablet Take 25 mg  by mouth 2 (two) times daily.     Marland Kitchen lisinopril (PRINIVIL,ZESTRIL) 40 MG tablet Take 40 mg by mouth daily.    . Megestrol Acetate (MEGACE ORAL PO) Take 1 tablet by mouth daily.    . methimazole (TAPAZOLE) 5 MG tablet Take 5 mg by mouth daily.    . Oxycodone HCl 10 MG TABS Take 10 mg by mouth every 4 (four) hours.    . polyethylene glycol powder (GLYCOLAX/MIRALAX) powder Take one capful every evening on days you do not have a good bowel movement. 527 g 5  . Tamsulosin HCl (FLOMAX) 0.4 MG CAPS Take 0.4 mg by mouth every evening.     . traZODone (DESYREL) 100 MG tablet Take 100 mg by mouth as needed for sleep.     No current facility-administered medications  for this visit.     Physical Exam: Vitals:   02/17/18 1546  BP: (!) 90/52  Pulse: 72  SpO2: 98%  Weight: 119 lb 12.8 oz (54.3 kg)  Height: 5\' 8"  (1.727 m)    GEN- The patient is elderly and frail appearing, alert and oriented x 3 today.   Head- normocephalic, atraumatic Eyes-  Sclera clear, conjunctiva pink Ears- hearing intact Oropharynx- clear Lungs- Clear to ausculation bilaterally, normal work of breathing Heart- Regular rate and rhythm 2/6 SEM LUSB which is late peaking GI- soft, NT, ND, + BS Extremities- no clubbing, cyanosis, or edema  Wt Readings from Last 3 Encounters:  02/17/18 119 lb 12.8 oz (54.3 kg)  02/09/18 116 lb 9.6 oz (52.9 kg)  09/23/17 129 lb 9.6 oz (58.8 kg)     Assessment and Plan:  1. Cryptogenic stroke No AF on ILR with over 3 years of monitoring Device is at EOS.  He wishes to have it removed.  Risks of device removal were discussed with the patient who wishes to proceed at this time.  2. Syncope No arrhythmias during ILR monitoring to explain syncope, seizures, or falls  3. Tobacco He is not interested in cessation (discussed today)  4. HTN Stable No change required today  Thompson Grayer MD, [facility] 02/17/2018 4:05 PM   Addendum:  ILR removal procedure Note:     DESCRIPTION OF PROCEDURE:  Informed written consent was obtained.  The patient required no sedation for the procedure today.   The patients left chest was therefore prepped and draped in the usual sterile fashion.  The skin overlying the ILR monitor was infiltrated with lidocaine for local analgesia.  A 0.5-cm incision was made over the site.  The previously implanted ILR was exposed and removed using a combination of sharp and blunt dissection.  Steri- Strips and a sterile dressing were then applied. EBL<28ml.  There were no early apparent complications.     CONCLUSIONS:   1. Successful explantation of a [employer] Reveal LINQ implantable loop recorder   2. No early apparent  complications.        Thompson Grayer MD, [facility] 02/17/2018 4:33 PM

## 2018-02-17 NOTE — Patient Instructions (Addendum)
Medication Instructions:  Your physician recommends that you continue on your current medications as directed. Please refer to the Current Medication list given to you today.  Labwork: None ordered.  Testing/Procedures: None ordered.  Follow-Up: Call us if you decide you would like to be seen for a wound check.   Your physician wants you to follow-up in: as needed with Dr. Rayann Heman.      Implantable Loop Recorder Placement, Care After Refer to this sheet in the next few weeks. These instructions provide you with information about caring for yourself after your procedure. Your health care provider may also give you more specific instructions. Your treatment has been planned according to current medical practices, but problems sometimes occur. Call your health care provider if you have any problems or questions after your procedure. What can I expect after the procedure? After the procedure, it is common to have:  Soreness or pain near the cut from surgery (incision).  Some swelling or bruising near the incision.  Follow these instructions at home:  Medicines  Take over-the-counter and prescription medicines only as told by your health care provider.  If you were prescribed an antibiotic medicine, take it as told by your health care provider. Do not stop taking the antibiotic even if you start to feel better.  Bathing Do not take baths, swim, or use a hot tub until your health care provider approves. You may shower 24 hours after removal of your monitor.  Incision care  Follow instructions from your health care provider about how to take care of your incision. Make sure you: ? Remove your top dressing after 24 hours (before you shower) ? Leave stitches (sutures), skin glue, or adhesive strips in place. These skin closures may need to stay in place for 2 weeks or longer. If adhesive strip edges start to loosen and curl up, you may trim the loose edges. Do not remove adhesive strips  completely unless your health care provider tells you to do that.  Check your incision area every day for signs of infection. Check for: ? More redness, swelling, or pain. ? Fluid or blood. ? Warmth. ? Pus or a bad smell.  Contact a health care provider if:  You have more redness, swelling, or pain around your incision.  You have more fluid or blood coming from your incision.  Your incision feels warm to the touch.  You have pus or a bad smell coming from your incision.  You have a fever.  You have pain that is not relieved by your pain medicine.  You have triggered your device because of fainting (syncope) or because of a heartbeat that feels like it is racing, slow, fluttering, or skipping (palpitations).

## 2018-02-23 ENCOUNTER — Ambulatory Visit: Payer: Medicare Other | Admitting: Adult Health

## 2018-02-25 ENCOUNTER — Encounter: Payer: Self-pay | Admitting: Adult Health

## 2018-02-25 ENCOUNTER — Encounter

## 2018-02-25 ENCOUNTER — Ambulatory Visit (INDEPENDENT_AMBULATORY_CARE_PROVIDER_SITE_OTHER): Payer: Medicare Other | Admitting: Adult Health

## 2018-02-25 VITALS — BP 117/83 | HR 77 | Ht 68.0 in | Wt 117.6 lb

## 2018-02-25 DIAGNOSIS — R413 Other amnesia: Secondary | ICD-10-CM | POA: Diagnosis not present

## 2018-02-25 DIAGNOSIS — R569 Unspecified convulsions: Secondary | ICD-10-CM

## 2018-02-25 DIAGNOSIS — Z5181 Encounter for therapeutic drug level monitoring: Secondary | ICD-10-CM | POA: Diagnosis not present

## 2018-02-25 DIAGNOSIS — R269 Unspecified abnormalities of gait and mobility: Secondary | ICD-10-CM | POA: Diagnosis not present

## 2018-02-25 DIAGNOSIS — I679 Cerebrovascular disease, unspecified: Secondary | ICD-10-CM

## 2018-02-25 MED ORDER — DIVALPROEX SODIUM ER 500 MG PO TB24: 1000.0000 mg | ORAL_TABLET | Freq: Every day | ORAL | 5 refills | Status: DC

## 2018-02-25 NOTE — Patient Instructions (Addendum)
Your Plan:  Change Depakote to Extended release 1000 mg daily Blood work today If your symptoms worsen or you develop new symptoms please let us know.   Thank you for coming to see [place] at [facility]. I hope we have been able to provide you high quality care today.  You may receive a patient satisfaction survey over the next few weeks. We would appreciate your feedback and comments so that we may continue to improve ourselves and the health of our patients.

## 2018-02-25 NOTE — Progress Notes (Signed)
PATIENT: Eric Tran DOB: [birth year]  REASON FOR VISIT: follow up HISTORY FROM: patient  HISTORY OF PRESENT ILLNESS: Today 02/25/18:  [name] is a 67 year old male with a history of cerebrovascular disease, gait disorder, seizures and memory disturbance.  He returns today for follow-up.  His wife is with him today.  He reports that he had several seizures in June.  Reports that they had a man living with him at the time that was giving him tramadol.  She feels that this caused his seizures.  She also notes that Depakote makes him sleepy therefore she was altering his dose if he had appointments.  The patient has not had any additional seizures in the month of August.  The patient is on oxycodone for pain.  Patient's wife feels that his memory has gotten worse.  In the past he has been on Namenda but was unable to tolerate it.  Aricept was not started due to history of seizures.  Patient requires assistance with all ADLs.  He is able to feed himself but typically only uses a fork.  The patient ambulates with a cane and a walker.  His wife reports that he continues to have falls.  She also reports that he has outbursts of crying.  The patient continues on trazodone however he only takes it on an as needed basis.  Patient returns today for an evaluation.  HISTORY [name] is a 67 year old right-handed white male with a history of cerebrovascular disease and a history of a gait disorder.  The patient has fallen on 3 occasions since last seen, he does use a cane for ambulation and he has a walker to use inside the house.  The patient may fall even using these devices.  The patient continues to have significant problems with insomnia, he has a lot of anxiety issues, and crying spells as well.  The patient is on low-dose trazodone 50 mg at night.  He is on Depakote and Trileptal for his seizures, he has not had any recurrence.  The patient is followed through a pain center for his low back pain and  leg discomfort.  He continues to have significant discomfort.  The patient has had some progression of his memory, he has not been able to tolerate Namenda and he has not been on Aricept given the history of the seizures.  The patient returns this office for further evaluation.   REVIEW OF SYSTEMS: Out of a complete 14 system review of symptoms, the patient complains only of the following symptoms, and all other reviewed systems are negative.  Appetite change, fatigue, shortness of breath, murmur, insomnia, snoring, sleep talking, constipation, cold intolerance, joint pain, back pain, muscle cramps, walking difficulty, neck pain, agitation, confusion with decreased concentration, depression, seizures, weakness, memory loss  ALLERGIES: Allergies  Allergen Reactions  . Keppra [Levetiracetam] Other (See Comments)    Suicidal ideation, anaphylaxsis  . Adhesive [Tape]     Severe rash  . Methadone     Delerium  . Morphine And Related Nausea And Vomiting    Can only take through IV.  . Tramadol     Caused seizures    HOME MEDICATIONS: Outpatient Medications Prior to Visit  Medication Sig Dispense Refill  . albuterol (PROVENTIL HFA;VENTOLIN HFA) 108 (90 Base) MCG/ACT inhaler Inhale 2 puffs into the lungs every 6 (six) hours as needed for wheezing or shortness of breath. 1 Inhaler 2  . ALPRAZolam (XANAX) 0.25 MG tablet Take 0.25 mg by mouth  3 (three) times daily as needed for anxiety.    . ASPIRIN 81 PO Take by mouth daily.    . Cholecalciferol (VITAMIN D) 2000 units CAPS Take by mouth daily.    . clopidogrel (PLAVIX) 75 MG tablet Take 75 mg by mouth daily. Reported on 10/03/2015    . divalproex (DEPAKOTE) 500 MG DR tablet Take 1 tablet (500 mg total) by mouth 2 (two) times daily. (Patient taking differently: Take 500 mg by mouth 2 (two) times daily. Taking 250mg  po AM and 500mg  po PM for the last week.) 180 tablet 3  . docusate sodium (COLACE) 100 MG capsule Take 100 mg by mouth 2 (two) times  daily.     . DULoxetine (CYMBALTA) 60 MG capsule Take 90 mg by mouth daily.     . fluticasone furoate-vilanterol (BREO ELLIPTA) 100-25 MCG/INH AEPB Inhale 1 puff into the lungs 2 (two) times daily.     Marland Kitchen gabapentin (NEURONTIN) 300 MG capsule Take 600 mg by mouth 3 (three) times daily.    . hydrALAZINE (APRESOLINE) 25 MG tablet Take 25 mg by mouth 2 (two) times daily.     Marland Kitchen lisinopril (PRINIVIL,ZESTRIL) 40 MG tablet Take 40 mg by mouth daily.    . Megestrol Acetate (MEGACE ORAL PO) Take 1 tablet by mouth daily.    . methimazole (TAPAZOLE) 5 MG tablet Take 5 mg by mouth daily.    . Oxycodone HCl 10 MG TABS Take 10 mg by mouth every 4 (four) hours.    . polyethylene glycol powder (GLYCOLAX/MIRALAX) powder Take one capful every evening on days you do not have a good bowel movement. 527 g 5  . Tamsulosin HCl (FLOMAX) 0.4 MG CAPS Take 0.4 mg by mouth every evening.     . traZODone (DESYREL) 100 MG tablet Take 100 mg by mouth as needed for sleep.     No facility-administered medications prior to visit.     PAST MEDICAL HISTORY: Past Medical History:  Diagnosis Date  . Abdominal aortic aneurysm ([place])   . Abnormality of gait 10/03/2014  . Anxiety   . Arthritis   . BPH (benign prostatic hyperplasia)   . Carpal tunnel syndrome   . Cerebrovascular disease 10/03/2014  . Chronic back pain    chronic narcotic use  . Chronic hepatitis C ([place]) 1980   [place] 2016, cured. HCV RNA negative 01/2016  . Chronic pain   . Degenerative disc disease   . Depression   . Head trauma   . Hepatic cirrhosis ([place]) 02/19/2016  . Hypertension   . Hyperthyroidism   . Memory difficulties 10/03/2014  . Partial complex seizure disorder without intractable epilepsy ([place]) 10/03/2014  . Seizures ([place])    Epilepsy  . Stroke [facility]) 2012   left hemiplegia  . TIA (transient ischemic attack)   . Tremor   . Tremor, essential 10/02/2016  . UTI (lower urinary tract infection)     PAST SURGICAL HISTORY: Past Surgical History:    Procedure Laterality Date  . EP IMPLANTABLE DEVICE N/A 11/15/2014   Procedure: Loop Recorder Insertion;  Surgeon: Thompson Grayer, MD;  Location: [facility];  Service: Cardiovascular;  Laterality: N/A;  . ESOPHAGOGASTRODUODENOSCOPY (EGD) WITH PROPOFOL N/A 03/20/2016   Procedure: ESOPHAGOGASTRODUODENOSCOPY (EGD) WITH PROPOFOL;  Surgeon: Daneil Dolin, MD;  Location[facility];  Service: Endoscopy;  Laterality: N/A;  . HAND SURGERY     right  . implantable loop recorder removal  02/17/2018   MDT LINQ removed by Dr Rayann Heman  for end of servce  . NECK SURGERY    . right hand reconstruction      FAMILY HISTORY: Family History  Problem Relation Age of Onset  . Cervical cancer Mother   . Heart failure Father   . Lung cancer Sister   . Breast cancer Sister   . Colon cancer Neg Hx   . Colon polyps Neg Hx     SOCIAL HISTORY: Social History   Socioeconomic History  . Marital status: Married    Spouse name: Not on file  . Number of children: 2  . Years of education: GED  . Highest education level: Not on file  Occupational History  . Occupation: unemployed    Fish farm manager: UNEMPLOYED  Social Needs  . Financial resource strain: Not on file  . Food insecurity:    Worry: Not on file    Inability: Not on file  . Transportation needs:    Medical: Not on file    Non-medical: Not on file  Tobacco Use  . Smoking status: Current Every Day Smoker    Packs/day: 1.00    Years: 45.00    Pack years: 45.00    Types: Cigarettes    Start date: 07/18/1962  . Smokeless tobacco: Never Used  Substance and Sexual Activity  . Alcohol use: No    Alcohol/week: 0.0 standard drinks    Comment: occasionally  . Drug use: No    Types: Marijuana    Comment: prior cocaine/ marijuana  . Sexual activity: Yes    Partners: Female    Comment: hx multiple sexual partners  Lifestyle  . Physical activity:    Days per week: Not on file    Minutes per session: Not on file  . Stress: Not on file   Relationships  . Social connections:    Talks on phone: Not on file    Gets together: Not on file    Attends religious service: Not on file    Active member of club or organization: Not on file    Attends meetings of clubs or organizations: Not on file    Relationship status: Not on file  . Intimate partner violence:    Fear of current or ex partner: Not on file    Emotionally abused: Not on file    Physically abused: Not on file    Forced sexual activity: Not on file  Other Topics Concern  . Not on file  Social History Narrative   1 daughter committed suicide 2008   Patient right handed.   Patient drinks about 6-7 cups of caffeine daily.         PHYSICAL EXAM  Vitals:   02/25/18 1424  Weight: 117 lb 9.6 oz (53.3 kg)  Height: 5\' 8"  (1.727 m)   Body mass index is 17.88 kg/m.   MMSE - Mini Mental State Exam 02/25/2018 07/23/2017 01/12/2017  Orientation to time 1 2 3   Orientation to Place 4 5 2   Registration 3 3 3   Attention/ Calculation 4 5 5   Recall 1 1 1   Language- name 2 objects 2 2 2   Language- repeat 1 1 1   Language- follow 3 step command 3 3 3   Language- read & follow direction 1 1 1   Write a sentence 1 1 1   Copy design 1 1 1   Total score 22 25 23      Generalized: Well developed, in no acute distress   Neurological examination  Mentation: Alert oriented to time, place, history taking. Follows all  commands speech and language fluent Cranial nerve II-XII: Pupils were equal round reactive to light. Extraocular movements were full, visual field were full on confrontational test. Facial sensation and strength were normal. Uvula tongue midline. Head turning and shoulder shrug  were normal and symmetric. Motor: The motor testing reveals 5 over 5 strength of all 4 extremities. Good symmetric motor tone is noted throughout.  Sensory: Sensory testing is intact to soft touch on all 4 extremities. No evidence of extinction is noted.   Coordination: Cerebellar testing  reveals good finger-nose-finger and heel-to-shin bilaterally. Tremor noted in the upper extremities Gait and station: Patient is in a wheelchair. Reflexes: Deep tendon reflexes are symmetric and normal bilaterally.   DIAGNOSTIC DATA (LABS, IMAGING, TESTING) - I reviewed patient records, labs, notes, testing and imaging myself where available.  Lab Results  Component Value Date   WBC 5.9 07/06/2017   HGB 15.5 07/06/2017   HCT 43.5 07/06/2017   MCV 88.4 07/06/2017   PLT 214 07/06/2017      Component Value Date/Time   NA 137 07/06/2017 1104   NA 145 (H) 01/12/2017 1400   K 4.0 07/06/2017 1104   K 4.5 04/28/2011 1149   CL 100 07/06/2017 1104   CL 100 04/28/2011 1149   CO2 31 07/06/2017 1104   CO2 28 04/28/2011 1149   GLUCOSE 95 07/06/2017 1104   BUN 16 07/06/2017 1104   BUN 14 01/12/2017 1400   CREATININE 0.90 02/08/2018 0950   CREATININE 0.82 07/06/2017 1104   CALCIUM 9.3 07/06/2017 1104   CALCIUM 9.7 04/28/2011 1149   PROT 6.6 07/06/2017 1104   PROT 6.9 01/12/2017 1400   ALBUMIN 3.9 02/24/2017 1811   ALBUMIN 4.4 01/12/2017 1400   AST 11 07/06/2017 1104   AST 101 04/28/2011 1149   ALT 7 (L) 07/06/2017 1104   ALKPHOS 40 02/24/2017 1811   ALKPHOS 293 04/28/2011 1149   BILITOT 0.8 07/06/2017 1104   BILITOT 0.5 01/12/2017 1400   BILITOT 3.2 04/28/2011 1149   GFRNONAA >60 02/24/2017 1737   GFRAA >60 02/24/2017 1737   Lab Results  Component Value Date   CHOL 173 11/22/2014   HDL 50 11/22/2014   LDLCALC 89 11/22/2014   TRIG 169 (H) 11/22/2014   Lab Results  Component Value Date   HGBA1C 5.5 11/22/2014   No results found for: VITAMINB12 Lab Results  Component Value Date   TSH 1.095 07/26/2012      ASSESSMENT AND PLAN 67 y.o. year old male  has a past medical history of Abdominal aortic aneurysm ([place]), Abnormality of gait (10/03/2014), Anxiety, Arthritis, BPH (benign prostatic hyperplasia), Carpal tunnel syndrome, Cerebrovascular disease (10/03/2014), Chronic back  pain, Chronic hepatitis C ([place]) (1980), Chronic pain, Degenerative disc disease, Depression, Head trauma, Hepatic cirrhosis ([place]) (02/19/2016), Hypertension, Hyperthyroidism, Memory difficulties (10/03/2014), Partial complex seizure disorder without intractable epilepsy ([place]) (10/03/2014), Seizures ([place]), Stroke ([place]) (2012), TIA (transient ischemic attack), Tremor, Tremor, essential (10/02/2016), and UTI (lower urinary tract infection). here with :  1.  Seizures 2.  Memory disturbance 3.  Cerebrovascular disease 4.  Gait abnormality  I will switch the patient's Depakote to extended release 1000 mg daily.  I advised the wife that she should not alter his dose.  If drowsiness does not improve they should let us know.  However the patient is on several other medications that could also be contributing to drowsiness.  The patient's memory score has declined slightly.  In the past he has tried [place] but was unable to  tolerate it.  I will check blood work today.  He is advised that if his symptoms worsen or he develops new symptoms he should let us know.  He will follow-up in 6 months or sooner if needed.   Ward Givens, MSN, NP-C 02/25/2018, 2:38 PM [facility] [address], [place], [place] [address] [phone]

## 2018-02-25 NOTE — Progress Notes (Signed)
I have read the note, and I agree with the clinical assessment and plan.  Symir Mah K Blayze Haen   

## 2018-02-27 LAB — COMPREHENSIVE METABOLIC PANEL
ALT: 5 IU/L (ref 0–44)
AST: 11 IU/L (ref 0–40)
Albumin/Globulin Ratio: 2.1 (ref 1.2–2.2)
Albumin: 4.2 g/dL (ref 3.6–4.8)
Alkaline Phosphatase: 46 IU/L (ref 39–117)
BUN/Creatinine Ratio: 20 (ref 10–24)
BUN: 18 mg/dL (ref 8–27)
Bilirubin Total: 0.8 mg/dL (ref 0.0–1.2)
CO2: 24 mmol/L (ref 20–29)
Calcium: 9 mg/dL (ref 8.6–10.2)
Chloride: 100 mmol/L (ref 96–106)
Creatinine, Ser: 0.91 mg/dL (ref 0.76–1.27)
GFR calc Af Amer: 100 mL/min/{1.73_m2} (ref >59)
GFR calc non Af Amer: 87 mL/min/{1.73_m2} (ref >59)
Globulin, Total: 2 g/dL (ref 1.5–4.5)
Glucose: 133 mg/dL — ABNORMAL HIGH (ref 65–99)
Potassium: 4.6 mmol/L (ref 3.5–5.2)
Sodium: 140 mmol/L (ref 134–144)
Total Protein: 6.2 g/dL (ref 6.0–8.5)

## 2018-02-27 LAB — CBC WITH DIFFERENTIAL/PLATELET
Basophils Absolute: 0.1 10*3/uL (ref 0.0–0.2)
Basos: 1 %
EOS (ABSOLUTE): 0.1 10*3/uL (ref 0.0–0.4)
Eos: 3 %
Hematocrit: 43 % (ref 37.5–51.0)
Hemoglobin: 14.7 g/dL (ref 13.0–17.7)
Immature Grans (Abs): 0 10*3/uL (ref 0.0–0.1)
Immature Granulocytes: 0 %
Lymphocytes Absolute: 1.8 10*3/uL (ref 0.7–3.1)
Lymphs: 49 %
MCH: 30.8 pg (ref 26.6–33.0)
MCHC: 34.2 g/dL (ref 31.5–35.7)
MCV: 90 fL (ref 79–97)
Monocytes Absolute: 0.3 10*3/uL (ref 0.1–0.9)
Monocytes: 8 %
Neutrophils Absolute: 1.5 10*3/uL (ref 1.4–7.0)
Neutrophils: 39 %
Platelets: 155 10*3/uL (ref 150–450)
RBC: 4.77 x10E6/uL (ref 4.14–5.80)
RDW: 12.9 % (ref 12.3–15.4)
WBC: 3.8 10*3/uL (ref 3.4–10.8)

## 2018-02-27 LAB — AMMONIA: Ammonia: 52 ug/dL (ref 27–102)

## 2018-02-27 LAB — VALPROIC ACID LEVEL: Valproic Acid Lvl: 50 ug/mL (ref 50–100)

## 2018-03-02 ENCOUNTER — Telehealth: Payer: Self-pay

## 2018-03-02 NOTE — Telephone Encounter (Signed)
I contacted the patients wife, April (ok per [facility]) and advised of results. She voiced understanding and had no further questions at this time. MB RN

## 2018-03-02 NOTE — Telephone Encounter (Signed)
-----   Message from Ward Givens, NP sent at 03/02/2018 10:50 AM EDT ----- Blood work is unremarkable.  Please call patient with results

## 2018-03-03 DIAGNOSIS — R269 Unspecified abnormalities of gait and mobility: Secondary | ICD-10-CM | POA: Diagnosis not present

## 2018-03-04 ENCOUNTER — Other Ambulatory Visit: Payer: Self-pay

## 2018-03-04 ENCOUNTER — Encounter: Payer: Self-pay | Admitting: *Deleted

## 2018-03-04 ENCOUNTER — Encounter: Payer: Self-pay | Admitting: Cardiology

## 2018-03-04 ENCOUNTER — Ambulatory Visit (INDEPENDENT_AMBULATORY_CARE_PROVIDER_SITE_OTHER): Payer: Medicare Other | Admitting: Cardiology

## 2018-03-04 VITALS — BP 135/86 | HR 75 | Ht 68.0 in | Wt 118.2 lb

## 2018-03-04 DIAGNOSIS — R0602 Shortness of breath: Secondary | ICD-10-CM

## 2018-03-04 DIAGNOSIS — I712 Thoracic aortic aneurysm, without rupture, unspecified: Secondary | ICD-10-CM

## 2018-03-04 DIAGNOSIS — I517 Cardiomegaly: Secondary | ICD-10-CM

## 2018-03-04 NOTE — Patient Instructions (Signed)

## 2018-03-04 NOTE — Patient Outreach (Signed)
[place] [facility]) Care Management  03/04/2018  GEOFF DACANAY 1951/02/18 [id number]   Follow up call to patient's wife, April Punt, regarding referral for assistance with ramp repair.  Ms. Scherzer reported that a volunteer from the [employer] was able to come out and complete the repairs and Mr. Mcchristian was able to get a new walker.  BSW agreed to contact Johnn Hai with [facility] to let her know that this referral can be closed due to repairs being completed.  BSW is closing case at this time due to no other social work needs being identified.   Ronn Melena, BSW Social Worker [phone]

## 2018-03-04 NOTE — Progress Notes (Signed)
Clinical Summary Eric Tran is a 67 y.o.male seen today for follow up of the following medicla problenms.     1. SOB -11/2016 stress test without ischemia - echo 10/2016 LVEF 65-70%, no WMAs, mild gradient, no provocative maneuvers performed. Previous gradient of 58mmHg with Valsalva - previous PFTs. Mild obstruction, air trapping, high airway resistance. Moderate emphysema on CT  - chronic SOB unchanged. Mixed compliance with inhalers      2. AAA - 3.3 cm noted on previous imagaing.  - Jan 2019 3.2 cm aneurysm - no recent symptoms  3. Thoracic aneurysm - CT 11/2016 3.9 cm ascedning aneurysm stable, 3.9 cm descending aneurysm. Denies any recent symptoms - followed by Dr Cyndia Bent CT surgery.   4. COPD - followed by Dr [name].   - has not had recent appointment.   5. Hx of CVA - patient with history of multiple CVAs in the past. According to neurology notes has had a pontine stroke, left cerebellar stroke, bilateral thalamic strokes, a left caudate stroke, right posterior limb of internal capsule stroke, right frontal stroke.  . - based on high concern for possible cardioembolic etiology, patient was originally referred for outpatient cardiac monitoring for possible afib/aflutter. He preferred an implanted loop recorder as opposed to external monitor, this was placed by Dr Rayann Heman   - loop recorder removed 01/2018    6. Left ventricular hypertrophy - dynamic gradient noted on echo, symmetric moderate hypertrophy and hyperdynamic LV is described.    - propanolol discontinued after recent admission with near syncope and significant pause on loop recorder.  - chronic SOB unchanged. No significant chest pain, no syncope.   - echo 2016 wall thickness 70mm, hyperdynamic LV. Provoked dynamic gradient of 99mmHg with chordal SAM  - SOB is stable. No recent syncope.   7. OSA - sleep study at [facility] - does not use CPAP machine due to discomfort, has not  had refitting. Followed at the [facility]  8. HTN - compliant with meds   Past Medical History:  Diagnosis Date  . Abdominal aortic aneurysm ([place])   . Abnormality of gait 10/03/2014  . Anxiety   . Arthritis   . BPH (benign prostatic hyperplasia)   . Carpal tunnel syndrome   . Cerebrovascular disease 10/03/2014  . Chronic back pain    chronic narcotic use  . Chronic hepatitis C ([place]) 1980   [place] 2016, cured. HCV RNA negative 01/2016  . Chronic pain   . Degenerative disc disease   . Depression   . Head trauma   . Hepatic cirrhosis ([place]) 02/19/2016  . Hypertension   . Hyperthyroidism   . Memory difficulties 10/03/2014  . Partial complex seizure disorder without intractable epilepsy ([place]) 10/03/2014  . Seizures ([place])    Epilepsy  . Stroke [facility]) 2012   left hemiplegia  . TIA (transient ischemic attack)   . Tremor   . Tremor, essential 10/02/2016  . UTI (lower urinary tract infection)      Allergies  Allergen Reactions  . Keppra [Levetiracetam] Other (See Comments)    Suicidal ideation, anaphylaxsis  . Adhesive [Tape]     Severe rash  . Methadone     Delerium  . Morphine And Related Nausea And Vomiting    Can only take through IV.  . Tramadol     Caused seizures     Current Outpatient Medications  Medication Sig Dispense Refill  . albuterol (PROVENTIL HFA;VENTOLIN HFA) 108 (90 Base) MCG/ACT inhaler Inhale 2 puffs  into the lungs every 6 (six) hours as needed for wheezing or shortness of breath. 1 Inhaler 2  . ALPRAZolam (XANAX) 0.25 MG tablet Take 0.25 mg by mouth 3 (three) times daily as needed for anxiety.    . ASPIRIN 81 PO Take by mouth daily.    . Cholecalciferol (VITAMIN D) 2000 units CAPS Take by mouth daily.    . clopidogrel (PLAVIX) 75 MG tablet Take 75 mg by mouth daily. Reported on 10/03/2015    . divalproex (DEPAKOTE ER) 500 MG 24 hr tablet Take 2 tablets (1,000 mg total) by mouth daily. 60 tablet 5  . docusate sodium (COLACE) 100 MG capsule Take 100 mg by mouth 2  (two) times daily.     . DULoxetine (CYMBALTA) 60 MG capsule Take 90 mg by mouth daily.     . fluticasone furoate-vilanterol (BREO ELLIPTA) 100-25 MCG/INH AEPB Inhale 1 puff into the lungs 2 (two) times daily.     Marland Kitchen gabapentin (NEURONTIN) 300 MG capsule Take 600 mg by mouth 3 (three) times daily.    . hydrALAZINE (APRESOLINE) 25 MG tablet Take 25 mg by mouth 2 (two) times daily.     Marland Kitchen lisinopril (PRINIVIL,ZESTRIL) 40 MG tablet Take 40 mg by mouth daily.    . Megestrol Acetate (MEGACE ORAL PO) Take 1 tablet by mouth daily.    . methimazole (TAPAZOLE) 5 MG tablet Take 5 mg by mouth daily.    . Oxycodone HCl 10 MG TABS Take 10 mg by mouth every 4 (four) hours.    . polyethylene glycol powder (GLYCOLAX/MIRALAX) powder Take one capful every evening on days you do not have a good bowel movement. 527 g 5  . Tamsulosin HCl (FLOMAX) 0.4 MG CAPS Take 0.4 mg by mouth every evening.     . traZODone (DESYREL) 100 MG tablet Take 100 mg by mouth as needed for sleep.     No current facility-administered medications for this visit.      Past Surgical History:  Procedure Laterality Date  . EP IMPLANTABLE DEVICE N/A 11/15/2014   Procedure: Loop Recorder Insertion;  Surgeon: Thompson Grayer, MD;  Location: [facility];  Service: Cardiovascular;  Laterality: N/A;  . ESOPHAGOGASTRODUODENOSCOPY (EGD) WITH PROPOFOL N/A 03/20/2016   Procedure: ESOPHAGOGASTRODUODENOSCOPY (EGD) WITH PROPOFOL;  Surgeon: Daneil Dolin, MD;  Location: [facility];  Service: Endoscopy;  Laterality: N/A;  . HAND SURGERY     right  . implantable loop recorder removal  02/17/2018   MDT LINQ removed by Dr Rayann Heman for end of servce  . NECK SURGERY    . right hand reconstruction       Allergies  Allergen Reactions  . Keppra [Levetiracetam] Other (See Comments)    Suicidal ideation, anaphylaxsis  . Adhesive [Tape]     Severe rash  . Methadone     Delerium  . Morphine And Related Nausea And Vomiting    Can only take through  IV.  . Tramadol     Caused seizures      Family History  Problem Relation Age of Onset  . Cervical cancer Mother   . Heart failure Father   . Lung cancer Sister   . Breast cancer Sister   . Colon cancer Neg Hx   . Colon polyps Neg Hx      Social History Mr. Coia reports that he has been smoking cigarettes. He started smoking about 55 years ago. He has a 45.00 pack-year smoking history. He has never used smokeless  tobacco. Mr. Minahan reports that he does not drink alcohol.   Review of Systems CONSTITUTIONAL: No weight loss, fever, chills, weakness or fatigue.  HEENT: Eyes: No visual loss, blurred vision, double vision or yellow sclerae.No hearing loss, sneezing, congestion, runny nose or sore throat.  SKIN: No rash or itching.  CARDIOVASCULAR: per hpi  RESPIRATORY: per hpi GASTROINTESTINAL: No anorexia, nausea, vomiting or diarrhea. No abdominal pain or blood.  GENITOURINARY: No burning on urination, no polyuria NEUROLOGICAL: No headache, dizziness, syncope, paralysis, ataxia, numbness or tingling in the extremities. No change in bowel or bladder control.  MUSCULOSKELETAL: No muscle, back pain, joint pain or stiffness.  LYMPHATICS: No enlarged nodes. No history of splenectomy.  PSYCHIATRIC: No history of depression or anxiety.  ENDOCRINOLOGIC: No reports of sweating, cold or heat intolerance. No polyuria or polydipsia.  Marland Kitchen   Physical Examination Vitals:   03/04/18 1331  BP: 135/86  Pulse: 75  SpO2: 96%   Vitals:   03/04/18 1331  Weight: 118 lb 3.2 oz (53.6 kg)  Height: 5\' 8"  (1.727 m)    Gen: resting comfortably, no acute distress HEENT: no scleral icterus, pupils equal round and reactive, no palptable cervical adenopathy,  CV: RRR, 3/6 systolic murmur rusb, no jvd Resp: Clear to auscultation bilaterally GI: abdomen is soft, non-tender, non-distended, normal bowel sounds, no hepatosplenomegaly MSK: extremities are warm, no edema.  Skin: warm, no rash Neuro:   no focal deficits Psych: appropriate affect   Diagnostic Studies 10/2014 echo Study Conclusions  - Left ventricle: The cavity size was mildly reduced. Wall thickness was increased in a pattern of moderate LVH. Systolic function was vigorous. The estimated ejection fraction was in the range of 65% to 70%. There is mitral chordal SAM. Resting LVOT gradient 27 mmHg with increase to 77 mmHg with Valsalva consistent with hypertrophic obstructive cardiomyopathy. Wall motion was normal; there were no regional wall motion abnormalities. Doppler parameters are consistent with abnormal left ventricular relaxation (grade 1 diastolic dysfunction). - Aortic valve: Mildly calcified annulus. Trileaflet; mildly calcified leaflets. Peak gradient (S): 27 mm Hg at rest reflecting LVOT flow. - Aortic root: The aortic root was mildly ectatic. - Mitral valve: Calcified annulus. There was trivial regurgitation. - Left atrium: The atrium was at the upper limits of normal in size. - Right atrium: Central venous pressure (est): 3 mm Hg. - Atrial septum: No defect or patent foramen ovale was identified. - Tricuspid valve: There was trivial regurgitation. - Pulmonary arteries: Systolic pressure could not be accurately estimated. - Pericardium, extracardiac: There was no pericardial effusion.  2017 PFTs Mild obstruction, air trapping, high airway resistance.  11/2016 nuclear stress test  There was no ST segment deviation noted during stress.  The study is normal. No ischemia or scar.  This is a low risk study.  Nuclear stress EF: 54%.      Assessment and Plan   1.SOB - fairly benign cardiac workup. Stress test without ischemia. Echo with normal LVEF. He does have diastolic dysfunction as well as an intracavitary dynamic gradient. Treatment for this is limited due to severe bradycardia on beta blockers in the past  No changes at this time, continue to follow up with  pulmonary. Encouraged increased compliance with inhalers.     2. Left ventricular hypertrophy - dynamic gradient noted on echo due to symmetric LVH and hyperdynamic LV - off beta blocker due to recent pauses noted on loop recorder and presyncope - continue to monitor at this time.    3. HTN -  reasonable control. Significnat orthostatic symptoms in the past, avoid aggressive management.   4. AAA and thoracic aneurysm - due for repeat CTA chest, will contact Dr Cyndia Bent to see if he would like [place] to arrange.        Arnoldo Lenis, M.D.

## 2018-03-12 ENCOUNTER — Other Ambulatory Visit: Payer: Self-pay | Admitting: Surgery

## 2018-03-12 DIAGNOSIS — I712 Thoracic aortic aneurysm, without rupture, unspecified: Secondary | ICD-10-CM

## 2018-03-12 NOTE — Patient Instructions (Signed)
Eric Tran  03/12/2018     @PREFPERIOPPHARMACY @   Your procedure is scheduled on   03/22/2018   Report to [facility] at  1100   A.M.  Call this number if you have problems the morning of surgery:  [phone]   Remember:  Do not eat or drink after midnight.  You may drink clear liquids until ( follow the instructions given to you) .  Clear liquids allowed are:                    Water, Juice (non-citric and without pulp), Carbonated beverages, Clear Tea, Black Coffee only, Plain Jell-O only, Gatorade and Plain Popsicles only    Take these medicines the morning of surgery with A SIP OF WATER  Xanax ( if needed), depakote, cymbalta, gabapentin, lisinopril, tapazole, oxycodone (if needed), flomax. Use your inhaler before you come.    Do not wear jewelry, make-up or nail polish.  Do not wear lotions, powders, or perfumes, or deodorant.  Do not shave 48 hours prior to surgery.  Men may shave face and neck.  Do not bring valuables to the hospital.  [facility] is not responsible for any belongings or valuables.  Contacts, dentures or bridgework may not be worn into surgery.  Leave your suitcase in the car.  After surgery it may be brought to your room.  For patients admitted to the hospital, discharge time will be determined by your treatment team.  Patients discharged the day of surgery will not be allowed to drive home.   Name and phone number of your driver:   family Special instructions:  Follow the diet instructions given to you by Dr Roseanne Kaufman office.  Please read over the following fact sheets that you were given. Anesthesia Post-op Instructions and Care and Recovery After Surgery       Esophagogastroduodenoscopy Esophagogastroduodenoscopy (EGD) is a procedure to examine the lining of the esophagus, stomach, and first part of the small intestine (duodenum). This procedure is done to check for problems such as inflammation, bleeding, ulcers, or  growths. During this procedure, a long, flexible, lighted tube with a camera attached (endoscope) is inserted down the throat. Tell a health care provider about:  Any allergies you have.  All medicines you are taking, including vitamins, herbs, eye drops, creams, and over-the-counter medicines.  Any problems you or family members have had with anesthetic medicines.  Any blood disorders you have.  Any surgeries you have had.  Any medical conditions you have.  Whether you are pregnant or may be pregnant. What are the risks? Generally, this is a safe procedure. However, problems may occur, including:  Infection.  Bleeding.  A tear (perforation) in the esophagus, stomach, or duodenum.  Trouble breathing.  Excessive sweating.  Spasms of the larynx.  A slowed heartbeat.  Low blood pressure.  What happens before the procedure?  Follow instructions from your health care provider about eating or drinking restrictions.  Ask your health care provider about: ? Changing or stopping your regular medicines. This is especially important if you are taking diabetes medicines or blood thinners. ? Taking medicines such as aspirin and ibuprofen. These medicines can thin your blood. Do not take these medicines before your procedure if your health care provider instructs you not to.  Plan to have someone take you home after the procedure.  If you wear dentures, be ready to remove them before the procedure. What  happens during the procedure?  To reduce your risk of infection, your health care team will wash or sanitize their hands.  An IV tube will be put in a vein in your hand or arm. You will get medicines and fluids through this tube.  You will be given one or more of the following: ? A medicine to help you relax (sedative). ? A medicine to numb the area (local anesthetic). This medicine may be sprayed into your throat. It will make you feel more comfortable and keep you from  gagging or coughing during the procedure. ? A medicine for pain.  A mouth guard may be placed in your mouth to protect your teeth and to keep you from biting on the endoscope.  You will be asked to lie on your left side.  The endoscope will be lowered down your throat into your esophagus, stomach, and duodenum.  Air will be put into the endoscope. This will help your health care provider see better.  The lining of your esophagus, stomach, and duodenum will be examined.  Your health care provider may: ? Take a tissue sample so it can be looked at in a lab (biopsy). ? Remove growths. ? Remove objects (foreign bodies) that are stuck. ? Treat any bleeding with medicines or other devices that stop tissue from bleeding. ? Widen (dilate) or stretch narrowed areas of your esophagus and stomach.  The endoscope will be taken out. The procedure may vary among health care providers and hospitals. What happens after the procedure?  Your blood pressure, heart rate, breathing rate, and blood oxygen level will be monitored often until the medicines you were given have worn off.  Do not eat or drink anything until the numbing medicine has worn off and your gag reflex has returned. This information is not intended to replace advice given to you by your health care provider. Make sure you discuss any questions you have with your health care provider. Document Released: 10/17/2004 Document Revised: 11/22/2015 Document Reviewed: 05/10/2015 Elsevier Interactive Patient Education  2018 [employer]. Esophagogastroduodenoscopy, Care After Refer to this sheet in the next few weeks. These instructions provide you with information about caring for yourself after your procedure. Your health care provider may also give you more specific instructions. Your treatment has been planned according to current medical practices, but problems sometimes occur. Call your health care provider if you have any problems or  questions after your procedure. What can I expect after the procedure? After the procedure, it is common to have:  A sore throat.  Nausea.  Bloating.  Dizziness.  Fatigue.  Follow these instructions at home:  Do not eat or drink anything until the numbing medicine (local anesthetic) has worn off and your gag reflex has returned. You will know that the local anesthetic has worn off when you can swallow comfortably.  Do not drive for 24 hours if you received a medicine to help you relax (sedative).  If your health care provider took a tissue sample for testing during the procedure, make sure to get your test results. This is your responsibility. Ask your health care provider or the department performing the test when your results will be ready.  Keep all follow-up visits as told by your health care provider. This is important. Contact a health care provider if:  You cannot stop coughing.  You are not urinating.  You are urinating less than usual. Get help right away if:  You have trouble swallowing.  You cannot  eat or drink.  You have throat or chest pain that gets worse.  You are dizzy or light-headed.  You faint.  You have nausea or vomiting.  You have chills.  You have a fever.  You have severe abdominal pain.  You have black, tarry, or bloody stools. This information is not intended to replace advice given to you by your health care provider. Make sure you discuss any questions you have with your health care provider. Document Released: 06/02/2012 Document Revised: 11/22/2015 Document Reviewed: 05/10/2015 Elsevier Interactive Patient Education  2018 [employer] Anesthesia is a term that refers to techniques, procedures, and medicines that help a person stay safe and comfortable during a medical procedure. Monitored anesthesia care, or sedation, is one type of anesthesia. Your anesthesia specialist may recommend sedation if you  will be having a procedure that does not require you to be unconscious, such as:  Cataract surgery.  A dental procedure.  A biopsy.  A colonoscopy.  During the procedure, you may receive a medicine to help you relax (sedative). There are three levels of sedation:  Mild sedation. At this level, you may feel awake and relaxed. You will be able to follow directions.  Moderate sedation. At this level, you will be sleepy. You may not remember the procedure.  Deep sedation. At this level, you will be asleep. You will not remember the procedure.  The more medicine you are given, the deeper your level of sedation will be. Depending on how you respond to the procedure, the anesthesia specialist may change your level of sedation or the type of anesthesia to fit your needs. An anesthesia specialist will monitor you closely during the procedure. Let your health care provider know about:  Any allergies you have.  All medicines you are taking, including vitamins, herbs, eye drops, creams, and over-the-counter medicines.  Any use of steroids (by mouth or as a cream).  Any problems you or family members have had with sedatives and anesthetic medicines.  Any blood disorders you have.  Any surgeries you have had.  Any medical conditions you have, such as sleep apnea.  Whether you are pregnant or may be pregnant.  Any use of cigarettes, alcohol, or street drugs. What are the risks? Generally, this is a safe procedure. However, problems may occur, including:  Getting too much medicine (oversedation).  Nausea.  Allergic reaction to medicines.  Trouble breathing. If this happens, a breathing tube may be used to help with breathing. It will be removed when you are awake and breathing on your own.  Heart trouble.  Lung trouble.  Before the procedure Staying hydrated Follow instructions from your health care provider about hydration, which may include:  Up to 2 hours before the  procedure - you may continue to drink clear liquids, such as water, clear fruit juice, black coffee, and plain tea.  Eating and drinking restrictions Follow instructions from your health care provider about eating and drinking, which may include:  8 hours before the procedure - stop eating heavy meals or foods such as meat, fried foods, or fatty foods.  6 hours before the procedure - stop eating light meals or foods, such as toast or cereal.  6 hours before the procedure - stop drinking milk or drinks that contain milk.  2 hours before the procedure - stop drinking clear liquids.  Medicines Ask your health care provider about:  Changing or stopping your regular medicines. This is especially important if you are  taking diabetes medicines or blood thinners.  Taking medicines such as aspirin and ibuprofen. These medicines can thin your blood. Do not take these medicines before your procedure if your health care provider instructs you not to.  Tests and exams  You will have a physical exam.  You may have blood tests done to show: ? How well your kidneys and liver are working. ? How well your blood can clot.  General instructions  Plan to have someone take you home from the hospital or clinic.  If you will be going home right after the procedure, plan to have someone with you for 24 hours.  What happens during the procedure?  Your blood pressure, heart rate, breathing, level of pain and overall condition will be monitored.  An IV tube will be inserted into one of your veins.  Your anesthesia specialist will give you medicines as needed to keep you comfortable during the procedure. This may mean changing the level of sedation.  The procedure will be performed. After the procedure  Your blood pressure, heart rate, breathing rate, and blood oxygen level will be monitored until the medicines you were given have worn off.  Do not drive for 24 hours if you received a  sedative.  You may: ? Feel sleepy, clumsy, or nauseous. ? Feel forgetful about what happened after the procedure. ? Have a sore throat if you had a breathing tube during the procedure. ? Vomit. This information is not intended to replace advice given to you by your health care provider. Make sure you discuss any questions you have with your health care provider. Document Released: 03/12/2005 Document Revised: 11/23/2015 Document Reviewed: 10/07/2015 Elsevier Interactive Patient Education  2018 [employer], Care After These instructions provide you with information about caring for yourself after your procedure. Your health care provider may also give you more specific instructions. Your treatment has been planned according to current medical practices, but problems sometimes occur. Call your health care provider if you have any problems or questions after your procedure. What can I expect after the procedure? After your procedure, it is common to:  Feel sleepy for several hours.  Feel clumsy and have poor balance for several hours.  Feel forgetful about what happened after the procedure.  Have poor judgment for several hours.  Feel nauseous or vomit.  Have a sore throat if you had a breathing tube during the procedure.  Follow these instructions at home: For at least 24 hours after the procedure:   Do not: ? Participate in activities in which you could fall or become injured. ? Drive. ? Use heavy machinery. ? Drink alcohol. ? Take sleeping pills or medicines that cause drowsiness. ? Make important decisions or sign legal documents. ? Take care of children on your own.  Rest. Eating and drinking  Follow the diet that is recommended by your health care provider.  If you vomit, drink water, juice, or soup when you can drink without vomiting.  Make sure you have little or no nausea before eating solid foods. General instructions  Have a  responsible adult stay with you until you are awake and alert.  Take over-the-counter and prescription medicines only as told by your health care provider.  If you smoke, do not smoke without supervision.  Keep all follow-up visits as told by your health care provider. This is important. Contact a health care provider if:  You keep feeling nauseous or you keep vomiting.  You feel light-headed.  You develop a rash.  You have a fever. Get help right away if:  You have trouble breathing. This information is not intended to replace advice given to you by your health care provider. Make sure you discuss any questions you have with your health care provider. Document Released: 10/07/2015 Document Revised: 02/06/2016 Document Reviewed: 10/07/2015 Elsevier Interactive Patient Education  [employer].

## 2018-03-17 ENCOUNTER — Encounter (HOSPITAL_COMMUNITY): Payer: Self-pay

## 2018-03-17 ENCOUNTER — Encounter (HOSPITAL_COMMUNITY)
Admission: RE | Admit: 2018-03-17 | Discharge: 2018-03-17 | Disposition: A | Discharge status: Home / Self Care | Payer: Medicare Other | Source: Ambulatory Visit | Attending: Internal Medicine | Admitting: Internal Medicine

## 2018-03-22 ENCOUNTER — Ambulatory Visit (HOSPITAL_COMMUNITY)
Admission: RE | Admit: 2018-03-22 | Discharge: 2018-03-22 | Disposition: A | Discharge status: Home / Self Care | Payer: Medicare Other | Source: Ambulatory Visit | Attending: Internal Medicine | Admitting: Internal Medicine

## 2018-03-22 ENCOUNTER — Ambulatory Visit (HOSPITAL_COMMUNITY): Payer: Medicare Other | Admitting: Anesthesiology

## 2018-03-22 ENCOUNTER — Encounter (HOSPITAL_COMMUNITY)
Admission: RE | Disposition: A | Discharge status: Home / Self Care | Payer: Self-pay | Source: Ambulatory Visit | Attending: Internal Medicine

## 2018-03-22 DIAGNOSIS — I10 Essential (primary) hypertension: Secondary | ICD-10-CM | POA: Diagnosis not present

## 2018-03-22 DIAGNOSIS — R6881 Early satiety: Secondary | ICD-10-CM | POA: Insufficient documentation

## 2018-03-22 DIAGNOSIS — Z8249 Family history of ischemic heart disease and other diseases of the circulatory system: Secondary | ICD-10-CM | POA: Insufficient documentation

## 2018-03-22 DIAGNOSIS — G40209 Localization-related (focal) (partial) symptomatic epilepsy and epileptic syndromes with complex partial seizures, not intractable, without status epilepticus: Secondary | ICD-10-CM | POA: Diagnosis not present

## 2018-03-22 DIAGNOSIS — B182 Chronic viral hepatitis C: Secondary | ICD-10-CM | POA: Diagnosis not present

## 2018-03-22 DIAGNOSIS — Z888 Allergy status to other drugs, medicaments and biological substances status: Secondary | ICD-10-CM | POA: Insufficient documentation

## 2018-03-22 DIAGNOSIS — Z885 Allergy status to narcotic agent status: Secondary | ICD-10-CM | POA: Diagnosis not present

## 2018-03-22 DIAGNOSIS — E0590 Thyrotoxicosis, unspecified without thyrotoxic crisis or storm: Secondary | ICD-10-CM | POA: Diagnosis not present

## 2018-03-22 DIAGNOSIS — F0150 Vascular dementia without behavioral disturbance: Secondary | ICD-10-CM | POA: Insufficient documentation

## 2018-03-22 DIAGNOSIS — F419 Anxiety disorder, unspecified: Secondary | ICD-10-CM | POA: Insufficient documentation

## 2018-03-22 DIAGNOSIS — M1990 Unspecified osteoarthritis, unspecified site: Secondary | ICD-10-CM | POA: Diagnosis not present

## 2018-03-22 DIAGNOSIS — I69354 Hemiplegia and hemiparesis following cerebral infarction affecting left non-dominant side: Secondary | ICD-10-CM | POA: Insufficient documentation

## 2018-03-22 DIAGNOSIS — F17210 Nicotine dependence, cigarettes, uncomplicated: Secondary | ICD-10-CM | POA: Diagnosis not present

## 2018-03-22 DIAGNOSIS — K449 Diaphragmatic hernia without obstruction or gangrene: Secondary | ICD-10-CM | POA: Diagnosis not present

## 2018-03-22 DIAGNOSIS — K7460 Unspecified cirrhosis of liver: Secondary | ICD-10-CM | POA: Insufficient documentation

## 2018-03-22 DIAGNOSIS — R630 Anorexia: Secondary | ICD-10-CM

## 2018-03-22 DIAGNOSIS — Z7982 Long term (current) use of aspirin: Secondary | ICD-10-CM | POA: Diagnosis not present

## 2018-03-22 DIAGNOSIS — I714 Abdominal aortic aneurysm, without rupture: Secondary | ICD-10-CM | POA: Insufficient documentation

## 2018-03-22 DIAGNOSIS — K3189 Other diseases of stomach and duodenum: Secondary | ICD-10-CM | POA: Insufficient documentation

## 2018-03-22 DIAGNOSIS — Z79899 Other long term (current) drug therapy: Secondary | ICD-10-CM | POA: Diagnosis not present

## 2018-03-22 DIAGNOSIS — F329 Major depressive disorder, single episode, unspecified: Secondary | ICD-10-CM | POA: Diagnosis not present

## 2018-03-22 DIAGNOSIS — R634 Abnormal weight loss: Secondary | ICD-10-CM

## 2018-03-22 DIAGNOSIS — K766 Portal hypertension: Secondary | ICD-10-CM | POA: Diagnosis not present

## 2018-03-22 HISTORY — PX: ESOPHAGOGASTRODUODENOSCOPY (EGD) WITH PROPOFOL: SHX5813

## 2018-03-22 SURGERY — ESOPHAGOGASTRODUODENOSCOPY (EGD) WITH PROPOFOL
Anesthesia: Monitor Anesthesia Care

## 2018-03-22 MED ORDER — PROMETHAZINE HCL 25 MG/ML IJ SOLN: 6.2500 mg | INTRAMUSCULAR | Status: DC | PRN

## 2018-03-22 MED ORDER — HYDROCODONE-ACETAMINOPHEN 7.5-325 MG PO TABS: 1.0000 | ORAL_TABLET | Freq: Once | ORAL | Status: DC | PRN

## 2018-03-22 MED ORDER — CHLORHEXIDINE GLUCONATE CLOTH 2 % EX PADS: 6.0000 | MEDICATED_PAD | Freq: Once | CUTANEOUS | Status: DC

## 2018-03-22 MED ORDER — LACTATED RINGERS IV SOLN: INTRAVENOUS | Status: DC

## 2018-03-22 MED ORDER — PROPOFOL 10 MG/ML IV BOLUS
INTRAVENOUS | Status: DC | PRN
  Administered 2018-03-22 (×2): 20 mg via INTRAVENOUS

## 2018-03-22 MED ORDER — MIDAZOLAM HCL 5 MG/5ML IJ SOLN
INTRAMUSCULAR | Status: DC | PRN
  Administered 2018-03-22 (×2): 1 mg via INTRAVENOUS

## 2018-03-22 MED ORDER — LACTATED RINGERS IV SOLN
INTRAVENOUS | Status: DC
  Administered 2018-03-22: 12:00:00 via INTRAVENOUS

## 2018-03-22 MED ORDER — MEPERIDINE HCL 100 MG/ML IJ SOLN: 6.2500 mg | INTRAMUSCULAR | Status: DC | PRN

## 2018-03-22 MED ORDER — PROPOFOL 500 MG/50ML IV EMUL
INTRAVENOUS | Status: DC | PRN
  Administered 2018-03-22: 150 ug/kg/min via INTRAVENOUS

## 2018-03-22 MED ORDER — MIDAZOLAM HCL 2 MG/2ML IJ SOLN
INTRAMUSCULAR | Status: AC
  Filled 2018-03-22: qty 2

## 2018-03-22 NOTE — H&P (Signed)
@LOGO @   Primary Care Physician:  Rosine Door Primary Gastroenterologist:  Dr. Gala Romney  Pre-Procedure History & Physical: HPI:  Eric Tran is a 67 y.o. male here for further evaluation of early satiety and upper bd pain.  Past Medical History:  Diagnosis Date  . Abdominal aortic aneurysm ([place])   . Abnormality of gait 10/03/2014  . Anxiety   . Arthritis   . BPH (benign prostatic hyperplasia)   . Carpal tunnel syndrome   . Cerebrovascular disease 10/03/2014  . Chronic back pain    chronic narcotic use  . Chronic hepatitis C ([place]) 1980   [place] 2016, cured. HCV RNA negative 01/2016  . Chronic pain   . Degenerative disc disease   . Depression   . Head trauma   . Hepatic cirrhosis ([place]) 02/19/2016  . Hypertension   . Hyperthyroidism   . Memory difficulties 10/03/2014  . Partial complex seizure disorder without intractable epilepsy ([place]) 10/03/2014  . Seizures ([place])    Epilepsy  . Stroke [facility]) 2012   left hemiplegia  . TIA (transient ischemic attack)   . Tremor   . Tremor, essential 10/02/2016  . UTI (lower urinary tract infection)     Past Surgical History:  Procedure Laterality Date  . EP IMPLANTABLE DEVICE N/A 11/15/2014   Procedure: Loop Recorder Insertion;  Surgeon: Thompson Grayer, MD;  Location: [place] [facility];  Service: Cardiovascular;  Laterality: N/A;  . ESOPHAGOGASTRODUODENOSCOPY (EGD) WITH PROPOFOL N/A 03/20/2016   Procedure: ESOPHAGOGASTRODUODENOSCOPY (EGD) WITH PROPOFOL;  Surgeon: Daneil Dolin, MD;  Location: [facility];  Service: Endoscopy;  Laterality: N/A;  . HAND SURGERY     right  . implantable loop recorder removal  02/17/2018   MDT LINQ removed by Dr Rayann Heman for end of servce  . NECK SURGERY    . right hand reconstruction      Prior to Admission medications   Medication Sig Start Date End Date Taking? Authorizing Provider  albuterol (PROVENTIL HFA;VENTOLIN HFA) 108 (90 Base) MCG/ACT inhaler Inhale 2 puffs into the lungs every 6 (six)  hours as needed for wheezing or shortness of breath. 01/31/16  Yes Branch, Alphonse Guild, MD  ALPRAZolam Duanne Moron) 0.25 MG tablet Take 0.25 mg by mouth daily as needed for anxiety.    Yes [provider]  aspirin EC 81 MG tablet Take 81 mg by mouth daily.   Yes [provider]  Cholecalciferol (VITAMIN D) 2000 units CAPS Take 2,000 Units by mouth daily.    Yes [provider]  clopidogrel (PLAVIX) 75 MG tablet Take 75 mg by mouth daily.    Yes [provider]  divalproex (DEPAKOTE ER) 500 MG 24 hr tablet Take 2 tablets (1,000 mg total) by mouth daily. 02/25/18  Yes Ward Givens, NP  docusate sodium (COLACE) 100 MG capsule Take 100 mg by mouth 2 (two) times daily.    Yes [provider]  DULoxetine (CYMBALTA) 30 MG capsule Take 90 mg by mouth daily.   Yes [provider]  fluticasone furoate-vilanterol (BREO ELLIPTA) 100-25 MCG/INH AEPB Inhale 1 puff into the lungs 2 (two) times daily.    Yes [provider]  gabapentin (NEURONTIN) 300 MG capsule Take 600 mg by mouth 3 (three) times daily.   Yes [provider]  hydrALAZINE (APRESOLINE) 25 MG tablet Take 25 mg by mouth 2 (two) times daily.    Yes [provider]  ibuprofen (ADVIL,MOTRIN) 200 MG tablet Take 400 mg by mouth daily as  needed for moderate pain.   Yes [provider]  lisinopril (PRINIVIL,ZESTRIL) 40 MG tablet Take 40 mg by mouth daily.   Yes [provider]  megestrol (MEGACE) 20 MG tablet Take 20 mg by mouth daily.   Yes [provider]  Menthol-Methyl Salicylate (MUSCLE RUB EX) Apply 1 application topically as needed (leg and back pain).   Yes [provider]  methimazole (TAPAZOLE) 5 MG tablet Take 5 mg by mouth daily.   Yes [provider]  Oxycodone HCl 20 MG TABS Take 20 mg by mouth every 6 (six) hours as needed (pain).   Yes [provider]  polyethylene glycol powder (GLYCOLAX/MIRALAX) powder Take one  capful every evening on days you do not have a good bowel movement. 06/25/17  Yes Mahala Menghini, PA-C  Tamsulosin HCl (FLOMAX) 0.4 MG CAPS Take 0.4 mg by mouth every evening.    Yes [provider]  traZODone (DESYREL) 100 MG tablet Take 100 mg by mouth at bedtime as needed for sleep.    Yes [provider]    Allergies as of 02/09/2018 - Review Complete 02/09/2018  Allergen Reaction Noted  . Keppra [levetiracetam] Other (See Comments) 10/03/2014  . Adhesive [tape]  11/15/2014  . Methadone  10/02/2016  . Morphine and related Nausea And Vomiting 05/01/2011  . Tramadol  02/09/2018    Family History  Problem Relation Age of Onset  . Cervical cancer Mother   . Heart failure Father   . Lung cancer Sister   . Breast cancer Sister   . Colon cancer Neg Hx   . Colon polyps Neg Hx     Social History   Socioeconomic History  . Marital status: Married    Spouse name: Not on file  . Number of children: 2  . Years of education: GED  . Highest education level: Not on file  Occupational History  . Occupation: unemployed    Fish farm manager: UNEMPLOYED  Social Needs  . Financial resource strain: Not on file  . Food insecurity:    Worry: Not on file    Inability: Not on file  . Transportation needs:    Medical: Not on file    Non-medical: Not on file  Tobacco Use  . Smoking status: Current Every Day Smoker    Packs/day: 1.00    Years: 45.00    Pack years: 45.00    Types: Cigarettes    Start date: 07/18/1962  . Smokeless tobacco: Never Used  Substance and Sexual Activity  . Alcohol use: No    Alcohol/week: 0.0 standard drinks    Comment: occasionally  . Drug use: No    Types: Marijuana    Comment: prior cocaine/ marijuana  . Sexual activity: Yes    Partners: Female    Comment: hx multiple sexual partners  Lifestyle  . Physical activity:    Days per week: Not on file    Minutes per session: Not on file  . Stress: Not on file  Relationships  . Social  connections:    Talks on phone: Not on file    Gets together: Not on file    Attends religious service: Not on file    Active member of club or organization: Not on file    Attends meetings of clubs or organizations: Not on file    Relationship status: Not on file  . Intimate partner violence:    Fear of current or ex partner: Not on file    Emotionally abused:  Not on file    Physically abused: Not on file    Forced sexual activity: Not on file  Other Topics Concern  . Not on file  Social History Narrative   1 daughter committed suicide 2008   Patient right handed.   Patient drinks about 6-7 cups of caffeine daily.       Review of Systems: See HPI, otherwise negative ROS  Physical Exam: BP (!) 150/96   Pulse 62   Temp 97.7 F (36.5 C) (Oral)   SpO2 98%  Mouth:  No deformity or lesions. Neck:  Supple; no masses or thyromegaly. No significant cervical adenopathy. Lungs:  Clear throughout to auscultation.   No wheezes, crackles, or rhonchi. No acute distress. Heart:  Regular rate and rhythm; no murmurs, clicks, rubs,  or gallops. Abdomen: Non-distended, normal bowel sounds.  Soft and nontender without appreciable mass or hepatosplenomegaly.  Pulses:  Normal pulses noted. Extremities:  Without clubbing or edema.  Impression/Plan:  67 y/o male wit early satiety and upper abd pain.  For EGD today.  The risks, benefits, limitations, alternatives and imponderables have been reviewed with the patient. Potential for esophageal dilation, biopsy, etc. have also been reviewed.  Questions have been answered. All parties agreeable.    Notice: This dictation was prepared with Dragon dictation along with smaller phrase technology. Any transcriptional errors that result from this process are unintentional and may not be corrected upon review.

## 2018-03-22 NOTE — Transfer of Care (Signed)
Immediate Anesthesia Transfer of Care Note  Patient: Eric Tran  Procedure(s) Performed: ESOPHAGOGASTRODUODENOSCOPY (EGD) WITH PROPOFOL (N/A )  Patient Location: PACU  Anesthesia Type:MAC  Level of Consciousness: sedated  Airway & Oxygen Therapy: Patient Spontanous Breathing  Post-op Assessment: Report given to RN  Post vital signs: Reviewed and stable  Last Vitals:  Vitals Value Taken Time  BP 144/82 03/22/2018  1:16 PM  Temp    Pulse 65 03/22/2018  1:19 PM  Resp 17 03/22/2018  1:19 PM  SpO2 96 % 03/22/2018  1:19 PM  Vitals shown include unvalidated device data.  Last Pain:  Vitals:   03/22/18 1121  TempSrc: Oral  PainSc: 0-No pain         Complications: No apparent anesthesia complications

## 2018-03-22 NOTE — Op Note (Signed)
[facility] Patient Name: Eric Tran Procedure Date: 03/22/2018 12:58 PM MRN: [id number] Date of Birth: Mar 27, 1951 Attending MD: Norvel Richards , MD CSN: [id number] Age: 67 Admit Type: Outpatient Procedure:                Upper GI endoscopy Indications:              Early satiety Providers:                Norvel Richards, MD, Janeece Riggers, RN, Nelma Rothman, Technician Referring MD:              Medicines:                Propofol per Anesthesia Complications:            No immediate complications. Estimated Blood Loss:     Estimated blood loss: none. Procedure:                Pre-Anesthesia Assessment:                           - Prior to the procedure, a History and Physical                            was performed, and patient medications and                            allergies were reviewed. The patient's tolerance of                            previous anesthesia was also reviewed. The risks                            and benefits of the procedure and the sedation                            options and risks were discussed with the patient.                            All questions were answered, and informed consent                            was obtained. Prior Anticoagulants: The patient has                            taken no previous anticoagulant or antiplatelet                            agents. ASA Grade Assessment: III - A patient with                            severe systemic disease. After reviewing the risks  and benefits, the patient was deemed in                            satisfactory condition to undergo the procedure.                           After obtaining informed consent, the endoscope was                            passed under direct vision. Throughout the                            procedure, the patient's blood pressure, pulse, and                            oxygen saturations were  monitored continuously. The                            GIF-H190 ([id number]) scope was introduced through the                            and advanced to the second part of duodenum. The                            upper GI endoscopy was accomplished without                            difficulty. The patient tolerated the procedure                            well. Scope In: 1:03:51 PM Scope Out: 1:09:33 PM Total Procedure Duration: 0 hours 5 minutes 42 seconds  Findings:      The examined esophagus was normal.      A small hiatal hernia was present.      Portal hypertensive gastropathy was found in the entire examined stomach.      The duodenal bulb and second portion of the duodenum were normal. Impression:               - Normal esophagus.                           - Small hiatal hernia.                           - Portal hypertensive gastropathy.                           - Normal duodenal bulb and second portion of the                            duodenum.                           - No specimens collected. Moderate Sedation:      Moderate (conscious) sedation was personally administered by an  anesthesia professional. The following parameters were monitored: oxygen       saturation, heart rate, blood pressure, respiratory rate, EKG, adequacy       of pulmonary ventilation, and response to care. Total physician       intraservice time was 11 minutes. Recommendation:           - Patient has a contact number available for                            emergencies. The signs and symptoms of potential                            delayed complications were discussed with the                            patient. Return to normal activities tomorrow.                            Written discharge instructions were provided to the                            patient.                           - Resume previous diet.                           - Continue present medications.                            - Repeat upper endoscopy in 2 years for screening                            purposes.                           - Return to GI office in 3 months. Procedure Code(s):        --- Professional ---                           [id number], Esophagogastroduodenoscopy, flexible,                            transoral; diagnostic, including collection of                            specimen(s) by brushing or washing, when performed                            (separate procedure) Diagnosis Code(s):        --- Professional ---                           K44.9, Diaphragmatic hernia without obstruction or                            gangrene  K76.6, Portal hypertension                           K31.89, Other diseases of stomach and duodenum                           R68.81, Early satiety CPT copyright 2017 American Medical Association. All rights reserved. The codes documented in this report are preliminary and upon coder review may  be revised to meet current compliance requirements. Cristopher Estimable. Bolton Canupp, MD Norvel Richards, MD 03/22/2018 1:16:21 PM This report has been signed electronically. Number of Addenda: 0

## 2018-03-22 NOTE — Anesthesia Postprocedure Evaluation (Signed)
Anesthesia Post Note  Patient: Eric Tran  Procedure(s) Performed: ESOPHAGOGASTRODUODENOSCOPY (EGD) WITH PROPOFOL (N/A )  Patient location during evaluation: PACU Anesthesia Type: MAC Level of consciousness: awake and alert and patient cooperative Pain management: satisfactory to patient Vital Signs Assessment: post-procedure vital signs reviewed and stable Respiratory status: spontaneous breathing Cardiovascular status: stable Postop Assessment: no apparent nausea or vomiting Anesthetic complications: no     Last Vitals:  Vitals:   03/22/18 1400 03/22/18 1403  BP:  (!) 151/90  Pulse: 74 76  Resp: 19 18  Temp:  36.6 C  SpO2:  95%    Last Pain:  Vitals:   03/22/18 1403  TempSrc: Oral  PainSc: 3                  [name]

## 2018-03-22 NOTE — Anesthesia Preprocedure Evaluation (Signed)
Anesthesia Evaluation  Patient identified by MRN, date of birth, ID band Patient awake    Reviewed: Allergy & Precautions, H&P , NPO status , Patient's Chart, lab work & pertinent test results, reviewed documented beta blocker date and time   Airway Mallampati: II  TM Distance: >3 FB Neck ROM: full    Dental no notable dental hx. (+) Edentulous Upper, Edentulous Lower   Pulmonary neg pulmonary ROS, pneumonia, Current Smoker,    Pulmonary exam normal breath sounds clear to auscultation       Cardiovascular Exercise Tolerance: Good hypertension, + Peripheral Vascular Disease  negative cardio ROS   Rhythm:regular Rate:Normal     Neuro/Psych  Headaches, Seizures -,  PSYCHIATRIC DISORDERS Anxiety Depression TIACVA negative neurological ROS  negative psych ROS   GI/Hepatic negative GI ROS, Neg liver ROS, (+) Hepatitis -, C  Endo/Other  negative endocrine ROSHyperthyroidism   Renal/GU negative Renal ROS  negative genitourinary   Musculoskeletal   Abdominal   Peds  Hematology negative hematology ROS (+)   Anesthesia Other Findings TIA, CVAs, 7 stroke in total, 2010- 16 with active SZ hx Deconditioned Malnutrition Vascular dementia  Reproductive/Obstetrics negative OB ROS                             Anesthesia Physical Anesthesia Plan  ASA: IV  Anesthesia Plan: MAC   Post-op Pain Management:    Induction:   PONV Risk Score and Plan:   Airway Management Planned:   Additional Equipment:   Intra-op Plan:   Post-operative Plan:   Informed Consent: I have reviewed the patients History and Physical, chart, labs and discussed the procedure including the risks, benefits and alternatives for the proposed anesthesia with the patient or authorized representative who has indicated his/her understanding and acceptance.   Dental Advisory Given  Plan Discussed with: CRNA and  Anesthesiologist  Anesthesia Plan Comments:         Anesthesia Quick Evaluation

## 2018-03-22 NOTE — Discharge Instructions (Signed)
Office visit with [place] in 3 months.  Follow up on January 3 at 11:30.  Repeat EGD in 2 years.  EGD Discharge instructions Please read the instructions outlined below and refer to this sheet in the next few weeks. These discharge instructions provide you with general information on caring for yourself after you leave the hospital. Your doctor may also give you specific instructions. While your treatment has been planned according to the most current medical practices available, unavoidable complications occasionally occur. If you have any problems or questions after discharge, please call your doctor. ACTIVITY  You may resume your regular activity but move at a slower pace for the next 24 hours.   Take frequent rest periods for the next 24 hours.   Walking will help expel (get rid of) the air and reduce the bloated feeling in your abdomen.   No driving for 24 hours (because of the anesthesia (medicine) used during the test).   You may shower.   Do not sign any important legal documents or operate any machinery for 24 hours (because of the anesthesia used during the test).  NUTRITION  Drink plenty of fluids.   You may resume your normal diet.   Begin with a light meal and progress to your normal diet.   Avoid alcoholic beverages for 24 hours or as instructed by your caregiver.  MEDICATIONS  You may resume your normal medications unless your caregiver tells you otherwise.  WHAT YOU CAN EXPECT TODAY  You may experience abdominal discomfort such as a feeling of fullness or gas pains.  FOLLOW-UP  Your doctor will discuss the results of your test with you.  SEEK IMMEDIATE MEDICAL ATTENTION IF ANY OF THE FOLLOWING OCCUR:  Excessive nausea (feeling sick to your stomach) and/or vomiting.   Severe abdominal pain and distention (swelling).   Trouble swallowing.   Temperature over 101 F (37.8 C).   Rectal bleeding or vomiting of blood.   PATIENT  INSTRUCTIONS POST-ANESTHESIA  IMMEDIATELY FOLLOWING SURGERY:  Do not drive or operate machinery for the first twenty four hours after surgery.  Do not make any important decisions for twenty four hours after surgery or while taking narcotic pain medications or sedatives.  If you develop intractable nausea and vomiting or a severe headache please notify your doctor immediately.  FOLLOW-UP:  Please make an appointment with your surgeon as instructed. You do not need to follow up with anesthesia unless specifically instructed to do so.  WOUND CARE INSTRUCTIONS (if applicable):  Keep a dry clean dressing on the anesthesia/puncture wound site if there is drainage.  Once the wound has quit draining you may leave it open to air.  Generally you should leave the bandage intact for twenty four hours unless there is drainage.  If the epidural site drains for more than 36-48 hours please call the anesthesia department.  QUESTIONS?:  Please feel free to call your physician or the hospital operator if you have any questions, and they will be happy to assist you.

## 2018-03-25 ENCOUNTER — Encounter (HOSPITAL_COMMUNITY): Payer: Self-pay | Admitting: Internal Medicine

## 2018-03-26 ENCOUNTER — Telehealth: Payer: Self-pay | Admitting: Adult Health

## 2018-03-26 NOTE — Telephone Encounter (Signed)
Called wife, April on [place] who stated the patient is "still having seizures".  She stated on Tues, Wed, Thurs he's had a total of four seizures. She witnessed three seizures, and one seizure may have happened in his sleep, because he had urinary incontinence.  She denied that he has missed any doses, and he is taking Depakote ER 500 mg, two tabs daily.  She stated he is due for a refill, and she isn't sure if he should be put back on Depakote as previously. She stated she thinks she remembers he was on this type Depakote in past, and his seizures weren't well controlled. She would like NP to let her know whether to continue with Depakote ER or if a change in Rx will be sent to [place].  This RN stated will send her questions to NP and let her know. She verbalized understanding, appreciation.

## 2018-03-26 NOTE — Telephone Encounter (Signed)
April the patients wife called and requested to speak with Ward Givens NP regarding the depakote the patient is taking. She feels that his seizures are not being managed. The patient will not go to the Emergency room. Please call and advise.

## 2018-03-29 MED ORDER — DIVALPROEX SODIUM ER 500 MG PO TB24: 1500.0000 mg | ORAL_TABLET | Freq: Every day | ORAL | 5 refills | Status: DC

## 2018-03-29 NOTE — Telephone Encounter (Signed)
I called the patient's wife there was no answer.  I left a voicemail for her to call at her earliest convenience.

## 2018-03-29 NOTE — Addendum Note (Signed)
Addended by: Trudie Buckler on: 03/29/2018 05:18 PM   Modules accepted: Orders

## 2018-03-29 NOTE — Telephone Encounter (Signed)
I called Patient's wife.  She reports that he had 4 seizures last week.  She states that the seizures consist of him losing the ability to talk and becoming very rigid.  She states that each time he fell to the floor and had urinary incontinence.  He did not lose consciousness.  This only lasted for about 10 to 20 seconds.  She reports that he is walking up in the morning with urinary incontinence but a seizurewas not witnessed.  He has not had any seizure events over the weekend that was witnessed but has had urinary incontinence at night.  Reports that prior to last week he was not having urinary incontinence at all.  He has had an appointment up with his primary care to rule out urinary tract infection.  He is tolerating Depakote ER thousand milligrams daily.  In the meantime I willl increase to 1500 mg daily.  We will recheck Depakote levels in 1 to 2 months.  If he is in fact diagnosed with a urinary tract infection we may be able to decrease his Depakote dose.

## 2018-03-30 ENCOUNTER — Telehealth: Payer: Self-pay | Admitting: Internal Medicine

## 2018-03-30 DIAGNOSIS — Z79899 Other long term (current) drug therapy: Secondary | ICD-10-CM | POA: Diagnosis not present

## 2018-03-30 DIAGNOSIS — Z5181 Encounter for therapeutic drug level monitoring: Secondary | ICD-10-CM | POA: Diagnosis not present

## 2018-03-30 DIAGNOSIS — G894 Chronic pain syndrome: Secondary | ICD-10-CM | POA: Diagnosis not present

## 2018-03-30 NOTE — Telephone Encounter (Signed)
If they declined the medication due to the tylenol and the fact he has cirrhosis, we could write a letter with recommendations regarding limiting tylenol consumption.

## 2018-03-30 NOTE — Telephone Encounter (Signed)
Pt's wife called to speak with LSL or RMR. I told her they were not available and I could take a message. She said patient was at the pain clinic and they were wanting to give him a medication that she didn't think he should be taking due to his liver. I transferred call to AM VM. [phone]

## 2018-03-30 NOTE — Telephone Encounter (Signed)
Spoke with spouse. Pt went to the [facility] and saw Dr. Red Christians. Pt is currently taking Oxycodone 10 mg and pts medication dosage has been changed to see which milligrams work best for the pt. Today they wanted to change the medication to Percocet/tylenol. Pts spouse declined that medication because she felt it wouldn't be best for pts condition. The [facility] said they weren't able to treat the pt since they didn't agree on the medication change. Pts spouse would like a provider to be aware of her decision to stay on the Oxycodone and if they knew of any pain clinics pt could try since they're looking for a new doctor? Please advise.

## 2018-03-31 DIAGNOSIS — L210 Seborrhea capitis: Secondary | ICD-10-CM | POA: Diagnosis not present

## 2018-03-31 DIAGNOSIS — M79642 Pain in left hand: Secondary | ICD-10-CM | POA: Diagnosis not present

## 2018-03-31 DIAGNOSIS — M79602 Pain in left arm: Secondary | ICD-10-CM | POA: Diagnosis not present

## 2018-03-31 DIAGNOSIS — Z720 Tobacco use: Secondary | ICD-10-CM | POA: Diagnosis not present

## 2018-03-31 DIAGNOSIS — E7800 Pure hypercholesterolemia, unspecified: Secondary | ICD-10-CM | POA: Diagnosis not present

## 2018-03-31 NOTE — Telephone Encounter (Signed)
Spoke with pts spouse and she would like a letter. Letter will be mailed once completed.

## 2018-04-07 NOTE — Telephone Encounter (Signed)
Letter completed. Please send to Dr. Darrol Angel can also give copy to patient's wife.

## 2018-04-08 NOTE — Telephone Encounter (Signed)
CC'ed to Dr Wess Botts and to Tawni Carnes (PCP)

## 2018-04-08 NOTE — Telephone Encounter (Signed)
Spoke with spouse. Letter was mailed to her. Please send a copy to pts doctor per LSL.

## 2018-04-15 DIAGNOSIS — I6930 Unspecified sequelae of cerebral infarction: Secondary | ICD-10-CM | POA: Diagnosis not present

## 2018-04-15 DIAGNOSIS — G890 Central pain syndrome: Secondary | ICD-10-CM | POA: Diagnosis not present

## 2018-04-15 DIAGNOSIS — G894 Chronic pain syndrome: Secondary | ICD-10-CM | POA: Diagnosis not present

## 2018-04-19 DIAGNOSIS — Z79899 Other long term (current) drug therapy: Secondary | ICD-10-CM | POA: Diagnosis not present

## 2018-04-19 DIAGNOSIS — Z79891 Long term (current) use of opiate analgesic: Secondary | ICD-10-CM | POA: Diagnosis not present

## 2018-04-19 DIAGNOSIS — G894 Chronic pain syndrome: Secondary | ICD-10-CM | POA: Diagnosis not present

## 2018-04-28 ENCOUNTER — Ambulatory Visit
Admission: RE | Admit: 2018-04-28 | Discharge: 2018-04-28 | Disposition: A | Discharge status: Home / Self Care | Payer: Medicare Other | Source: Ambulatory Visit | Attending: Surgery | Admitting: Surgery

## 2018-04-28 ENCOUNTER — Other Ambulatory Visit: Payer: Self-pay

## 2018-04-28 ENCOUNTER — Encounter: Payer: Self-pay | Admitting: Surgery

## 2018-04-28 ENCOUNTER — Ambulatory Visit (INDEPENDENT_AMBULATORY_CARE_PROVIDER_SITE_OTHER): Payer: Medicare Other | Admitting: Surgery

## 2018-04-28 VITALS — BP 158/88 | HR 71 | Resp 18 | Ht 68.0 in | Wt 121.0 lb

## 2018-04-28 DIAGNOSIS — I712 Thoracic aortic aneurysm, without rupture, unspecified: Secondary | ICD-10-CM

## 2018-04-28 DIAGNOSIS — N2889 Other specified disorders of kidney and ureter: Secondary | ICD-10-CM | POA: Diagnosis not present

## 2018-04-28 MED ORDER — IOPAMIDOL (ISOVUE-370) INJECTION 76%
75.0000 mL | Freq: Once | INTRAVENOUS | Status: AC | PRN
  Administered 2018-04-28: 75 mL via INTRAVENOUS

## 2018-04-29 ENCOUNTER — Encounter: Payer: Self-pay | Admitting: Surgery

## 2018-04-29 NOTE — Progress Notes (Signed)
HPI:  The patient returns for follow-up of a mildly dilated ascending aorta at 3.9 cm as well as dilatation of the aorta at the diaphragm level to 3.9 cm with a small penetrating ulcer in the anterior wall of the aorta just at the inferior margin of the aneurysm.  I saw him initially 1 year ago.  He is now 53 and has significant disability due to cerebrovascular disease with multiple prior strokes and left-sided weakness, a long history of heavy smoking with COPD, some dementia, and chronic pain due to degenerative arthritis.  When I saw him a year ago he had failure to thrive with weight loss and poor appetite and that is not changed much.  Current Outpatient Medications  Medication Sig Dispense Refill  . albuterol (PROVENTIL HFA;VENTOLIN HFA) 108 (90 Base) MCG/ACT inhaler Inhale 2 puffs into the lungs every 6 (six) hours as needed for wheezing or shortness of breath. 1 Inhaler 2  . ALPRAZolam (XANAX) 0.25 MG tablet Take 0.25 mg by mouth daily as needed for anxiety.     Marland Kitchen aspirin EC 81 MG tablet Take 81 mg by mouth daily.    . Cholecalciferol (VITAMIN D) 2000 units CAPS Take 2,000 Units by mouth daily.     . clopidogrel (PLAVIX) 75 MG tablet Take 75 mg by mouth daily.     . divalproex (DEPAKOTE ER) 500 MG 24 hr tablet Take 3 tablets (1,500 mg total) by mouth daily. 90 tablet 5  . docusate sodium (COLACE) 100 MG capsule Take 100 mg by mouth 2 (two) times daily.     . DULoxetine (CYMBALTA) 30 MG capsule Take 90 mg by mouth daily.    . fluticasone furoate-vilanterol (BREO ELLIPTA) 100-25 MCG/INH AEPB Inhale 1 puff into the lungs 2 (two) times daily.     Marland Kitchen gabapentin (NEURONTIN) 300 MG capsule Take 600 mg by mouth 3 (three) times daily.    . hydrALAZINE (APRESOLINE) 25 MG tablet Take 25 mg by mouth 2 (two) times daily.     Marland Kitchen ibuprofen (ADVIL,MOTRIN) 200 MG tablet Take 400 mg by mouth daily as needed for moderate pain.    Marland Kitchen lisinopril (PRINIVIL,ZESTRIL) 40 MG tablet Take 40 mg by mouth daily.     . megestrol (MEGACE) 20 MG tablet Take 20 mg by mouth daily.    . Menthol-Methyl Salicylate (MUSCLE RUB EX) Apply 1 application topically as needed (leg and back pain).    . methimazole (TAPAZOLE) 5 MG tablet Take 5 mg by mouth daily.    . Oxycodone HCl 20 MG TABS Take 10 mg by mouth every 4 (four) hours as needed (pain).     . polyethylene glycol powder (GLYCOLAX/MIRALAX) powder Take one capful every evening on days you do not have a good bowel movement. 527 g 5  . Tamsulosin HCl (FLOMAX) 0.4 MG CAPS Take 0.4 mg by mouth every evening.     . traZODone (DESYREL) 100 MG tablet Take 100 mg by mouth at bedtime as needed for sleep.      No current facility-administered medications for this visit.      Physical Exam: BP (!) 158/88 (BP Location: Right Arm, Patient Position: Sitting, Cuff Size: Normal)   Pulse 71   Resp 18   Ht 5\' 8"  (1.727 m)   Wt 121 lb (54.9 kg)   SpO2 98% Comment: RA  BMI 18.40 kg/m  Thin, frail appearing gentleman in a wheel chair. Does not say much.  Cardiac exam shows a regular rate  and rhythm with normal heart sounds.  There is no murmur. Lungs are clear.  Diagnostic Tests:  CLINICAL DATA:  Thoracic aortic aneurysm without rupture.  EXAM: CT ANGIOGRAPHY CHEST, ABDOMEN AND PELVIS  TECHNIQUE: Multidetector CT imaging through the chest, abdomen and pelvis was performed using the standard protocol during bolus administration of intravenous contrast. Multiplanar reconstructed images and MIPs were obtained and reviewed to evaluate the vascular anatomy.  CONTRAST:  56mL ISOVUE-370 IOPAMIDOL (ISOVUE-370) INJECTION 76%  COMPARISON:  CT scan of January 12, 2017.  FINDINGS: CTA CHEST FINDINGS  Cardiovascular: There is no evidence of thoracic aortic dissection or aneurysm. Great vessels are widely patent without significant stenosis. Distal portion of descending thoracic aorta has maximum measured diameter 3.7 cm with stable penetrating  atherosclerotic ulcer. Normal cardiac size. No pericardial effusion is noted.  Mediastinum/Nodes: No enlarged mediastinal, hilar, or axillary lymph nodes. Thyroid gland, trachea, and esophagus demonstrate no significant findings.  Lungs/Pleura: Lungs are clear. No pleural effusion or pneumothorax.  Musculoskeletal: No chest wall abnormality. No acute or significant osseous findings.  Review of the MIP images confirms the above findings.  CTA ABDOMEN AND PELVIS FINDINGS  VASCULAR  Aorta: Atherosclerosis of abdominal aorta is noted without aneurysm or dissection.  Celiac: Patent without evidence of aneurysm, dissection, vasculitis or significant stenosis.  SMA: Patent without evidence of aneurysm, dissection, vasculitis or significant stenosis.  Renals: Both renal arteries are patent without evidence of aneurysm, dissection, vasculitis, fibromuscular dysplasia or significant stenosis.  IMA: Patent without evidence of aneurysm, dissection, vasculitis or significant stenosis.  Inflow: Patent without evidence of aneurysm, dissection, vasculitis or significant stenosis.  Veins: No obvious venous abnormality within the limitations of this arterial phase study.  Review of the MIP images confirms the above findings.  NON-VASCULAR  Hepatobiliary: 1.9 mm peripherally enhancing abnormality is noted anteriorly in the right hepatic lobe which is not seen to did definitively fill in on the delayed images. Therefore this is not diagnostic for hemangioma based on CT criteria. No gallstones are noted. No biliary dilatation is noted.  Pancreas: Unremarkable. No pancreatic ductal dilatation or surrounding inflammatory changes.  Spleen: Normal in size without focal abnormality.  Adrenals/Urinary Tract: Adrenal glands are unremarkable. Left kidney appears normal. Mild right renal cortical scarring is noted. No hydronephrosis or renal obstruction is noted. Urinary  bladder is unremarkable.  Stomach/Bowel: The stomach appears normal. There is no evidence of bowel obstruction or inflammation. The appendix is not clearly visualized.  Lymphatic: No significant adenopathy is noted.  Reproductive: Prostate is unremarkable.  Other: No abdominal wall hernia or abnormality. No abdominopelvic ascites.  Musculoskeletal: No acute or significant osseous findings.  Review of the MIP images confirms the above findings.  IMPRESSION: Grossly stable dilatation of distal descending thoracic aorta is noted, currently measured at 3.7 cm. Stable small penetrating atherosclerotic ulcer is seen in this area as well.  No evidence of abdominal aortic aneurysm or dissection is noted.  Mesenteric and renal arteries are widely patent.  1.9 cm peripherally enhancing abnormality is noted anteriorly in the right hepatic lobe which is not diagnostic for hemangioma based on CT criteria. Further evaluation with MRI with and without contrast is recommended on nonemergent basis to rule out neoplasm.  Mild right renal cortical scarring.  Aortic Atherosclerosis (ICD10-I70.0).   Electronically Signed   By: Marijo Conception, M.D.   On: 04/28/2018 16:03   Impression:  He has stable dilatation of the ascending aorta at about 3.7 cm as well as stable dilatation of  the distal descending aorta at 3.7 cm with a small penetrating ulcer that is unchanged.  There is no indication for surgical treatment.  I think we could wait for 2 years to do a follow-up scan although he is in such a poor general condition that I would not really consider doing any surgical intervention in his case.  I reviewed the CTA images with the patient and his wife and answered their questions.  Plan:  I will tentatively plan to see him back in about 2 years with a CTA of the chest and abdomen to follow-up on the small aneurysms assuming that he does not progressively deteriorate from a  general medical condition. He is going to follow up with Dr. Gala Romney otherwise.  I spent 15 minutes performing this established patient evaluation and > 50% of this time was spent face to face counseling and coordinating the care of this patient's aortic aneurysms.    Gaye Pollack, MD [facility] [phone]

## 2018-05-04 DIAGNOSIS — Z79899 Other long term (current) drug therapy: Secondary | ICD-10-CM | POA: Diagnosis not present

## 2018-05-04 DIAGNOSIS — G894 Chronic pain syndrome: Secondary | ICD-10-CM | POA: Diagnosis not present

## 2018-05-04 DIAGNOSIS — M792 Neuralgia and neuritis, unspecified: Secondary | ICD-10-CM | POA: Diagnosis not present

## 2018-05-04 DIAGNOSIS — Z79891 Long term (current) use of opiate analgesic: Secondary | ICD-10-CM | POA: Diagnosis not present

## 2018-05-06 DIAGNOSIS — G890 Central pain syndrome: Secondary | ICD-10-CM | POA: Diagnosis not present

## 2018-05-06 DIAGNOSIS — G894 Chronic pain syndrome: Secondary | ICD-10-CM | POA: Diagnosis not present

## 2018-06-01 DIAGNOSIS — G890 Central pain syndrome: Secondary | ICD-10-CM | POA: Diagnosis not present

## 2018-06-01 DIAGNOSIS — G894 Chronic pain syndrome: Secondary | ICD-10-CM | POA: Diagnosis not present

## 2018-06-01 DIAGNOSIS — I6930 Unspecified sequelae of cerebral infarction: Secondary | ICD-10-CM | POA: Diagnosis not present

## 2018-06-03 ENCOUNTER — Encounter: Payer: Self-pay | Admitting: Internal Medicine

## 2018-06-03 DIAGNOSIS — G894 Chronic pain syndrome: Secondary | ICD-10-CM | POA: Diagnosis not present

## 2018-06-03 DIAGNOSIS — Z79891 Long term (current) use of opiate analgesic: Secondary | ICD-10-CM | POA: Diagnosis not present

## 2018-06-03 DIAGNOSIS — Z79899 Other long term (current) drug therapy: Secondary | ICD-10-CM | POA: Diagnosis not present

## 2018-06-08 ENCOUNTER — Telehealth: Payer: Self-pay | Admitting: Internal Medicine

## 2018-06-08 NOTE — Telephone Encounter (Signed)
[facility] called this afternoon regarding patient and Dr Red Christians wanted to speak with LSL about patient. I told the lady from his office that LSL had left for the day. She said that LSL could call tomorrow and Dr Kirke Corin cell number is [phone]

## 2018-06-09 NOTE — Telephone Encounter (Signed)
Spoke with Dr. Wess Botts regarding patient. Patient has h/o HCV s/p eradication with prior imaging with nodular liver boarder and EGD with findings suggestive of portal HTN. Most recent imaging without overt cirrhosis and hepatic function has been stable. Would be okay for patient to have up to 2 grams of acetaminophen daily as part of pain control regimen.

## 2018-06-09 NOTE — Telephone Encounter (Signed)
LMOAM for return call.  

## 2018-06-09 NOTE — Telephone Encounter (Signed)
Tried to call number. No answer. Will attempt again later.

## 2018-07-01 DIAGNOSIS — Z79891 Long term (current) use of opiate analgesic: Secondary | ICD-10-CM | POA: Diagnosis not present

## 2018-07-01 DIAGNOSIS — G894 Chronic pain syndrome: Secondary | ICD-10-CM | POA: Diagnosis not present

## 2018-07-01 DIAGNOSIS — Z79899 Other long term (current) drug therapy: Secondary | ICD-10-CM | POA: Diagnosis not present

## 2018-07-01 DIAGNOSIS — I6930 Unspecified sequelae of cerebral infarction: Secondary | ICD-10-CM | POA: Diagnosis not present

## 2018-07-01 DIAGNOSIS — G890 Central pain syndrome: Secondary | ICD-10-CM | POA: Diagnosis not present

## 2018-07-02 ENCOUNTER — Ambulatory Visit: Payer: Self-pay | Admitting: Internal Medicine

## 2018-07-05 DIAGNOSIS — Z87820 Personal history of traumatic brain injury: Secondary | ICD-10-CM | POA: Diagnosis not present

## 2018-07-05 DIAGNOSIS — J449 Chronic obstructive pulmonary disease, unspecified: Secondary | ICD-10-CM | POA: Diagnosis not present

## 2018-07-05 DIAGNOSIS — G459 Transient cerebral ischemic attack, unspecified: Secondary | ICD-10-CM | POA: Diagnosis not present

## 2018-07-05 DIAGNOSIS — Z720 Tobacco use: Secondary | ICD-10-CM | POA: Diagnosis not present

## 2018-07-07 ENCOUNTER — Telehealth: Payer: Self-pay | Admitting: *Deleted

## 2018-07-07 NOTE — Telephone Encounter (Signed)
It can wait till march visit.

## 2018-07-07 NOTE — Telephone Encounter (Addendum)
Called wife, April on [place] and informed her that the patient never came in for repeat depakote lab level after medication dose was increased by NP in Sept. She stated he has been doing well and denied him having any seizures. She stated that she has at times only given him two tabs daily due to diarrhea. He has been on two tabs daily for the past 1-2 weeks.  This RN advised her he has FU with Dr Jannifer Franklin 09/15/18, and also that this RN will let NP know of this conversation. If any further advice from NP, this RN will call her back. She verbalized understanding, appreciation of call.

## 2018-07-13 ENCOUNTER — Ambulatory Visit: Payer: Self-pay | Admitting: Internal Medicine

## 2018-07-13 ENCOUNTER — Encounter: Payer: Self-pay | Admitting: Internal Medicine

## 2018-07-13 ENCOUNTER — Ambulatory Visit (INDEPENDENT_AMBULATORY_CARE_PROVIDER_SITE_OTHER): Payer: Medicare Other | Admitting: Internal Medicine

## 2018-07-13 ENCOUNTER — Telehealth: Payer: Self-pay | Admitting: *Deleted

## 2018-07-13 ENCOUNTER — Other Ambulatory Visit: Payer: Self-pay | Admitting: *Deleted

## 2018-07-13 VITALS — BP 144/92 | HR 89 | Temp 97.3°F | Ht 68.0 in | Wt 111.2 lb

## 2018-07-13 DIAGNOSIS — R634 Abnormal weight loss: Secondary | ICD-10-CM

## 2018-07-13 DIAGNOSIS — R6881 Early satiety: Secondary | ICD-10-CM

## 2018-07-13 NOTE — Patient Instructions (Signed)
Gastroparesis diet information  Gastric emptying study - [facility]  Continue Megace  Follow through on neuro psychiatric evaluation recently done  Further recommendation to follow  OV here ion 3 months

## 2018-07-13 NOTE — Telephone Encounter (Signed)
GES scheduled for 07/22/2018 at 8:00am, arrival time 7:45am. NPO after midnight, no stomach medications after midnight. Test can take up to 4 hrs.  Called patient and spoke with spouse April. She voiced understanding regarding appt details.

## 2018-07-13 NOTE — Progress Notes (Signed)
Primary Care Physician:  Rosine Door Primary Gastroenterologist:  Dr. Gala Romney  Pre-Procedure History & Physical: HPI:  Eric Tran is a 68 y.o. male here for follow-up of anorexia/early satiety/weight loss.  Lost another 4 pounds since last seen.  EGD recently non-revealing as far as a cause of his symptoms.  CTA abdomen negative for mesenteric ischemia.  Recent neuropsych evaluation at the [facility] demonstrated significant abnormalities.  Wife says Parkinson/vascular dementia in the mix.  This was done on December 30.  Full report to follow to his providers.  Patient states he is down in the dumps quite a bit.  He has multiple crying spells often.  Only eats about half of his 3 meals daily.  States he feels full.  After eating very little.  On Megace (previously going to try Marinol but insurance would not pay for it).  Also on multiple medications including Duragesic.  Denies reflux.  Denies dysphagia.  Denies abdominal pain.  Feels he has normal bowel function daily.  Past Medical History:  Diagnosis Date  . Abdominal aortic aneurysm ([place])   . Abnormality of gait 10/03/2014  . Anxiety   . Arthritis   . BPH (benign prostatic hyperplasia)   . Carpal tunnel syndrome   . Cerebrovascular disease 10/03/2014  . Chronic back pain    chronic narcotic use  . Chronic hepatitis C ([place]) 1980   [place] 2016, cured. HCV RNA negative 01/2016  . Chronic pain   . Degenerative disc disease   . Depression   . Head trauma   . Hepatic cirrhosis ([place]) 02/19/2016  . Hypertension   . Hyperthyroidism   . Memory difficulties 10/03/2014  . Partial complex seizure disorder without intractable epilepsy ([place]) 10/03/2014  . Seizures ([place])    Epilepsy  . Stroke [facility]) 2012   left hemiplegia  . TIA (transient ischemic attack)   . Tremor   . Tremor, essential 10/02/2016  . UTI (lower urinary tract infection)     Past Surgical History:  Procedure Laterality Date  . EP IMPLANTABLE DEVICE  N/A 11/15/2014   Procedure: Loop Recorder Insertion;  Surgeon: Thompson Grayer, MD;  Location: [facility];  Service: Cardiovascular;  Laterality: N/A;  . ESOPHAGOGASTRODUODENOSCOPY (EGD) WITH PROPOFOL N/A 03/20/2016   Procedure: ESOPHAGOGASTRODUODENOSCOPY (EGD) WITH PROPOFOL;  Surgeon: Daneil Dolin, MD;  [facility];  Service: Endoscopy;  Laterality: N/A;  . ESOPHAGOGASTRODUODENOSCOPY (EGD) WITH PROPOFOL N/A 03/22/2018   Procedure: ESOPHAGOGASTRODUODENOSCOPY (EGD) WITH PROPOFOL;  Surgeon: Daneil Dolin, MD;  [facility];  Service: Endoscopy;  Laterality: N/A;  12:30pm  . HAND SURGERY     right  . implantable loop recorder removal  02/17/2018   MDT LINQ removed by Dr Rayann Heman for end of servce  . NECK SURGERY    . right hand reconstruction      Prior to Admission medications   Medication Sig Start Date End Date Taking? Authorizing Provider  albuterol (PROVENTIL HFA;VENTOLIN HFA) 108 (90 Base) MCG/ACT inhaler Inhale 2 puffs into the lungs every 6 (six) hours as needed for wheezing or shortness of breath. 01/31/16  Yes BranchAlphonse Guild, MD  alendronate (FOSAMAX) 10 MG tablet Take 10 mg by mouth daily before breakfast. Take with a full glass of water on an empty stomach.   Yes [provider]  ALPRAZolam (XANAX) 0.25 MG tablet Take 0.25 mg by mouth daily as needed for anxiety.    Yes [provider]  aspirin  EC 81 MG tablet Take 81 mg by mouth daily.   Yes [provider]  Cholecalciferol (VITAMIN D) 2000 units CAPS Take 2,000 Units by mouth daily.    Yes [provider]  clopidogrel (PLAVIX) 75 MG tablet Take 75 mg by mouth daily.    Yes [provider]  divalproex (DEPAKOTE ER) 500 MG 24 hr tablet Take 3 tablets (1,500 mg total) by mouth daily. 03/29/18  Yes Ward Givens, NP  docusate sodium (COLACE) 100 MG capsule Take 100 mg by mouth 2 (two) times daily.    Yes [provider]  DULoxetine (CYMBALTA) 30 MG  capsule Take 90 mg by mouth daily.   Yes [provider]  fentaNYL 37.5 MCG/HR PT72 Place 1 patch onto the skin every 3 (three) days.   Yes [provider]  gabapentin (NEURONTIN) 300 MG capsule Take 600 mg by mouth 3 (three) times daily.   Yes [provider]  hydrALAZINE (APRESOLINE) 25 MG tablet Take 25 mg by mouth 2 (two) times daily. Sometimes holds if bp is low   Yes [provider]  ibuprofen (ADVIL,MOTRIN) 200 MG tablet Take 400 mg by mouth daily as needed for moderate pain.   Yes [provider]  lisinopril (PRINIVIL,ZESTRIL) 40 MG tablet Take 40 mg by mouth daily.   Yes [provider]  megestrol (MEGACE) 20 MG tablet Take 20 mg by mouth daily.   Yes [provider]  Menthol-Methyl Salicylate (MUSCLE RUB EX) Apply 1 application topically as needed (leg and back pain).   Yes [provider]  methimazole (TAPAZOLE) 5 MG tablet Take 5 mg by mouth daily.   Yes [provider]  polyethylene glycol powder (GLYCOLAX/MIRALAX) powder Take one capful every evening on days you do not have a good bowel movement. 06/25/17  Yes Mahala Menghini, PA-C  Tamsulosin HCl (FLOMAX) 0.4 MG CAPS Take 0.4 mg by mouth every evening.    Yes [provider]  traZODone (DESYREL) 100 MG tablet Take 100 mg by mouth at bedtime as needed for sleep.    Yes [provider]  fluticasone furoate-vilanterol (BREO ELLIPTA) 100-25 MCG/INH AEPB Inhale 1 puff into the lungs 2 (two) times daily.     [provider]  Oxycodone HCl 20 MG TABS Take 10 mg by mouth every 4 (four) hours as needed (pain).     [provider]    Allergies as of 07/13/2018 - Review Complete 07/13/2018  Allergen Reaction Noted  . Keppra [levetiracetam] Anaphylaxis 10/03/2014  . Adhesive [tape]  11/15/2014  . Methadone  10/02/2016  . Morphine and related Nausea And Vomiting 05/01/2011  . Tramadol  02/09/2018    Family History  Problem  Relation Age of Onset  . Cervical cancer Mother   . Heart failure Father   . Lung cancer Sister   . Breast cancer Sister   . Colon cancer Neg Hx   . Colon polyps Neg Hx     Social History   Socioeconomic History  . Marital status: Married    Spouse name: Not on file  . Number of children: 2  . Years of education: GED  . Highest education level: Not on file  Occupational History  . Occupation: unemployed    Fish farm manager: UNEMPLOYED  Social Needs  . Financial resource strain: Not on file  . Food insecurity:    Worry: Not on file    Inability: Not on file  . Transportation needs:    Medical: Not  on file    Non-medical: Not on file  Tobacco Use  . Smoking status: Current Every Day Smoker    Packs/day: 1.00    Years: 45.00    Pack years: 45.00    Types: Cigarettes    Start date: 07/18/1962  . Smokeless tobacco: Never Used  Substance and Sexual Activity  . Alcohol use: No    Alcohol/week: 0.0 standard drinks    Comment: occasionally  . Drug use: No    Types: Marijuana    Comment: prior cocaine/ marijuana  . Sexual activity: Yes    Partners: Female    Comment: hx multiple sexual partners  Lifestyle  . Physical activity:    Days per week: Not on file    Minutes per session: Not on file  . Stress: Not on file  Relationships  . Social connections:    Talks on phone: Not on file    Gets together: Not on file    Attends religious service: Not on file    Active member of club or organization: Not on file    Attends meetings of clubs or organizations: Not on file    Relationship status: Not on file  . Intimate partner violence:    Fear of current or ex partner: Not on file    Emotionally abused: Not on file    Physically abused: Not on file    Forced sexual activity: Not on file  Other Topics Concern  . Not on file  Social History Narrative   1 daughter committed suicide 2008   Patient right handed.   Patient drinks about 6-7 cups of caffeine daily.       Review  of Systems: See HPI, otherwise negative ROS  Physical Exam: BP (!) 144/92   Pulse 89   Temp (!) 97.3 F (36.3 C) (Oral)   Ht 5\' 8"  (1.727 m)   Wt 111 lb 3.2 oz (50.4 kg)   BMI 16.91 kg/m  General:   Disheveled and flat affect.  Otherwise pleasant and conversant.  Accompanied by his nurse wife.  :   Eyes:  Sclera clear, no icterus.   Conjunctiva pink. Lungs:  Clear throughout to auscultation.   No wheezes, crackles, or rhonchi. No acute distress. Heart:  Regular rate and rhythm; no murmurs, clicks, rubs,  or gallops. Abdomen: Non-distended, normal bowel sounds.  Soft and nontender without appreciable mass or hepatosplenomegaly.  Pulses:  Normal pulses noted. Extremities:  Without clubbing or edema.  Impression/Plan: Pleasant 68 year old [place] veteran with history of HCV cirrhosis-remains well compensated, multiple other comorbid abilities with chronic early satiety and weight loss.  He has had extensive evaluation for organic causes of weight loss. Based on dietary recall, his weight loss appears to be due to chronic inadequate caloric intake.  He may have gastroparesis.  He has not been formally evaluated.  Clinically, seems depressed on duloxetine.  Sounds like findings of recent neuropsychiatric evaluation would be impacting his ability to eat.  Recommendations:  Gastroparesis diet information  Gastric emptying study - [facility]  Continue Megace  Follow through on neuro psychiatric evaluation recently done  Further recommendation to follow  OV here ion 3 months    Notice: This dictation was prepared with Dragon dictation along with smaller phrase technology. Any transcriptional errors that result from this process are unintentional and may not be corrected upon review.

## 2018-07-13 NOTE — Telephone Encounter (Signed)
PA approved via [facility] website. Auth# [id number] dates 07/13/2018-08/27/2018.

## 2018-07-13 NOTE — Progress Notes (Signed)
duplicate

## 2018-07-13 NOTE — Telephone Encounter (Signed)
Called [facility] and was advised by Thurmond Butts that they are currently busy and will call back to schedule GES (order in).

## 2018-07-22 ENCOUNTER — Encounter (HOSPITAL_COMMUNITY)
Admission: RE | Admit: 2018-07-22 | Discharge: 2018-07-22 | Disposition: A | Discharge status: Home / Self Care | Payer: Medicare Other | Source: Ambulatory Visit | Attending: Internal Medicine | Admitting: Internal Medicine

## 2018-07-22 ENCOUNTER — Encounter (HOSPITAL_COMMUNITY): Payer: Self-pay

## 2018-07-22 DIAGNOSIS — R634 Abnormal weight loss: Secondary | ICD-10-CM | POA: Insufficient documentation

## 2018-07-22 DIAGNOSIS — R6881 Early satiety: Secondary | ICD-10-CM | POA: Diagnosis not present

## 2018-07-22 MED ORDER — TECHNETIUM TC 99M SULFUR COLLOID
2.0000 | Freq: Once | INTRAVENOUS | Status: AC | PRN
  Administered 2018-07-22: 2 via ORAL

## 2018-07-26 ENCOUNTER — Encounter: Payer: Self-pay | Admitting: Internal Medicine

## 2018-07-26 NOTE — Progress Notes (Signed)
SCHEDULED AND LETTER SENT  °

## 2018-07-29 DIAGNOSIS — Z79899 Other long term (current) drug therapy: Secondary | ICD-10-CM | POA: Diagnosis not present

## 2018-07-29 DIAGNOSIS — G890 Central pain syndrome: Secondary | ICD-10-CM | POA: Diagnosis not present

## 2018-07-29 DIAGNOSIS — G894 Chronic pain syndrome: Secondary | ICD-10-CM | POA: Diagnosis not present

## 2018-07-29 DIAGNOSIS — Z79891 Long term (current) use of opiate analgesic: Secondary | ICD-10-CM | POA: Diagnosis not present

## 2018-07-29 DIAGNOSIS — I6930 Unspecified sequelae of cerebral infarction: Secondary | ICD-10-CM | POA: Diagnosis not present

## 2018-08-26 DIAGNOSIS — G894 Chronic pain syndrome: Secondary | ICD-10-CM | POA: Diagnosis not present

## 2018-08-26 DIAGNOSIS — G890 Central pain syndrome: Secondary | ICD-10-CM | POA: Diagnosis not present

## 2018-08-26 DIAGNOSIS — I6930 Unspecified sequelae of cerebral infarction: Secondary | ICD-10-CM | POA: Diagnosis not present

## 2018-08-30 DIAGNOSIS — Z79891 Long term (current) use of opiate analgesic: Secondary | ICD-10-CM | POA: Diagnosis not present

## 2018-08-30 DIAGNOSIS — G894 Chronic pain syndrome: Secondary | ICD-10-CM | POA: Diagnosis not present

## 2018-08-30 DIAGNOSIS — Z79899 Other long term (current) drug therapy: Secondary | ICD-10-CM | POA: Diagnosis not present

## 2018-09-15 ENCOUNTER — Ambulatory Visit: Payer: Medicare Other | Admitting: Neurology

## 2018-09-15 ENCOUNTER — Telehealth: Payer: Self-pay | Admitting: Neurology

## 2018-09-15 NOTE — Telephone Encounter (Signed)
This patient did not show for a revisit appointment today. 

## 2018-09-17 ENCOUNTER — Telehealth: Payer: Self-pay | Admitting: Gastroenterology

## 2018-09-17 ENCOUNTER — Telehealth: Payer: Self-pay | Admitting: Neurology

## 2018-09-17 DIAGNOSIS — K7460 Unspecified cirrhosis of liver: Secondary | ICD-10-CM

## 2018-09-17 DIAGNOSIS — K769 Liver disease, unspecified: Secondary | ICD-10-CM

## 2018-09-17 NOTE — Telephone Encounter (Signed)
"  Patient's wife asked if OK to drop DEPAKOTE 1000 Mg as patient has diarrhea?" patient is a seizure patient and multi infarct dementia,  chronic pain- thalamic pain.   He is on 1500 mg depakote-  for one week. Wife fears he had another stroke- he changed behaviors on 09-03-2018.   [facility] increased to 1250 mg , diarrhea started. Increased to 1500 mg and diarrhea worsened.    Will decrease to 100 mg and speak to dr Jannifer Franklin next week, patients is able to receive video consult. Wife is Therapist, sports.

## 2018-09-17 NOTE — Telephone Encounter (Signed)
Please let patient know he is due for hepatoma surveillance in setting of HCV cirrhosis, and he had 1.9cm right hepatic lobe lesion seen on CTA done by Dr. Cyndia Bent that needs follow up.   Please schedule MRI abd with and without contrast for above. He can have this done in four weeks or so if he wants to wait due to coronavirus.

## 2018-09-20 ENCOUNTER — Telehealth: Payer: Self-pay | Admitting: Neurology

## 2018-09-20 NOTE — Telephone Encounter (Signed)
Pt's wife called back today and requested a call from Dr. Jannifer Franklin to further discuss this message.

## 2018-09-20 NOTE — Telephone Encounter (Signed)
Called pt and spoke with spouse April (on dpr). She was agreeable for pt to have MRI done.

## 2018-09-20 NOTE — Telephone Encounter (Signed)
I discussed this issue with Ms. Marzetta Board earlier, I will be seeing the patient in the office this week.

## 2018-09-20 NOTE — Telephone Encounter (Signed)
I called the wife.  The patient has diarrhea on Depakote on doses greater than 1000.  He is able to tolerate the thousand milligrams dosing, he has not had any recent seizures.  During the first week of March, the patient had onset of increased weakness of the right hand and arm, this persisted for several days and then gradually improved, the wife is concerned that he may have sustained a stroke but he did not want to go to the emergency room and would not allow her to call EMS.  The patient has a revisit scheduled for 2 days from now, I will go ahead and have him come into the office so that I can evaluate him.

## 2018-09-20 NOTE — Telephone Encounter (Addendum)
MRI scheduled for 4/23 at 11:00am, arrival time 10:30am, npo 4 hrs prior.  Called April and she is aware of appt details.  Checked [employer] and [place] [employer] and no PA is required

## 2018-09-20 NOTE — Addendum Note (Signed)
Addended by: Inge Rise on: 09/20/2018 08:49 AM   Modules accepted: Orders

## 2018-09-20 NOTE — Telephone Encounter (Signed)
Wife requested Eric Tran Depakote dose to be reduced to 1000 mg from 1500 mg as it caused diarrhea.

## 2018-09-20 NOTE — Telephone Encounter (Signed)
Noted  

## 2018-09-22 ENCOUNTER — Ambulatory Visit (INDEPENDENT_AMBULATORY_CARE_PROVIDER_SITE_OTHER): Payer: Medicare Other | Admitting: Neurology

## 2018-09-22 ENCOUNTER — Other Ambulatory Visit: Payer: Self-pay

## 2018-09-22 ENCOUNTER — Encounter: Payer: Self-pay | Admitting: Neurology

## 2018-09-22 VITALS — BP 100/67 | HR 91 | Ht 68.0 in | Wt 117.3 lb

## 2018-09-22 DIAGNOSIS — R269 Unspecified abnormalities of gait and mobility: Secondary | ICD-10-CM | POA: Diagnosis not present

## 2018-09-22 DIAGNOSIS — I639 Cerebral infarction, unspecified: Secondary | ICD-10-CM

## 2018-09-22 DIAGNOSIS — Z5181 Encounter for therapeutic drug level monitoring: Secondary | ICD-10-CM | POA: Diagnosis not present

## 2018-09-22 DIAGNOSIS — G40209 Localization-related (focal) (partial) symptomatic epilepsy and epileptic syndromes with complex partial seizures, not intractable, without status epilepticus: Secondary | ICD-10-CM

## 2018-09-22 DIAGNOSIS — R413 Other amnesia: Secondary | ICD-10-CM | POA: Diagnosis not present

## 2018-09-22 DIAGNOSIS — G4752 REM sleep behavior disorder: Secondary | ICD-10-CM

## 2018-09-22 DIAGNOSIS — I679 Cerebrovascular disease, unspecified: Secondary | ICD-10-CM | POA: Diagnosis not present

## 2018-09-22 DIAGNOSIS — R300 Dysuria: Secondary | ICD-10-CM

## 2018-09-22 HISTORY — DX: REM sleep behavior disorder: G47.52

## 2018-09-22 MED ORDER — AMITRIPTYLINE HCL 10 MG PO TABS: ORAL_TABLET | ORAL | 0 refills | Status: DC

## 2018-09-22 MED ORDER — DIVALPROEX SODIUM ER 500 MG PO TB24: 1000.0000 mg | ORAL_TABLET | Freq: Every day | ORAL | Status: DC

## 2018-09-22 NOTE — Progress Notes (Signed)
Reason for visit: New onset right-sided weakness, increased confusion  Eric Tran is a 68 y.o. male  History of present illness:  Eric Tran is a 68 year old right-handed white male with a history of cerebrovascular disease, the patient has had a brainstem stroke in the past, he has a left-sided pain syndrome following this and a left hemiparesis.  The patient has an associated gait disorder, and a history of seizures associated with cerebrovascular disease.  The patient has been on Depakote, doses higher than 1000 mg daily resulted in diarrhea, but he has not had a seizure since July 2019.  In the past, he has been on Trileptal as well but apparently stopped the medication for some reason.  The patient comes into the office today urgently for a new problem.  The patient had onset of slurred speech, increased confusion, and apparent worsening of use of the right arm and right leg.  This started about a week after he was placed on amitriptyline 25 mg at night.  The patient apparently had some improvement of his right-sided weakness over a 5-day timeframe.  The patient was noted to have elevation in blood pressures around this time with systolic pressures in the 160 range and diastolic pressures from 250-539.  The patient did report a headache around this time, this has since resolved.  The patient has had persistence of gait instability since the onset of his symptoms around the first week in March 2020.  The patient has fallen multiple times, he has fallen on a daily basis now.  At baseline, he would use a walker, he may still fall about once a week.  The patient is no longer using his walker.  He is now having hallucinations, he is having vivid dreams at night and talking in his sleep and acting out his dreams.  The patient comes to this office for further evaluation.  The patient refused to go to the hospital with onset of the right-sided weakness, he has stated multiple times that he does not wish  to be resuscitated.  He still smokes cigarettes, 3 to 4 cigarettes daily.  Since onset of the right-sided weakness, he has had to use adult diapers, he now has incontinence.   Past Medical History:  Diagnosis Date   Abdominal aortic aneurysm (HCC)    Abnormality of gait 10/03/2014   Anxiety    Arthritis    BPH (benign prostatic hyperplasia)    Carpal tunnel syndrome    Cerebrovascular disease 10/03/2014   Chronic back pain    chronic narcotic use   Chronic hepatitis C ([place]) 1980   [place] 2016, cured. HCV RNA negative 01/2016   Chronic pain    Degenerative disc disease    Depression    Head trauma    Hepatic cirrhosis ([place]) 02/19/2016   Hypertension    Hyperthyroidism    Memory difficulties 10/03/2014   Partial complex seizure disorder without intractable epilepsy ([place]) 10/03/2014   Seizures ([facility])    Epilepsy   Stroke ([place]) 2012   left hemiplegia   TIA (transient ischemic attack)    Tremor    Tremor, essential 10/02/2016   UTI (lower urinary tract infection)     Past Surgical History:  Procedure Laterality Date   EP IMPLANTABLE DEVICE N/A 11/15/2014   Procedure: Loop Recorder Insertion;  Surgeon: Thompson Grayer, MD;  Location: [facility];  Service: Cardiovascular;  Laterality: N/A;   ESOPHAGOGASTRODUODENOSCOPY (EGD) WITH PROPOFOL N/A 03/20/2016   Procedure: ESOPHAGOGASTRODUODENOSCOPY (EGD)  WITH PROPOFOL;  Surgeon: Daneil Dolin, MD;  [facility];  Service: Endoscopy;  Laterality: N/A;   ESOPHAGOGASTRODUODENOSCOPY (EGD) WITH PROPOFOL N/A 03/22/2018   Procedure: ESOPHAGOGASTRODUODENOSCOPY (EGD) WITH PROPOFOL;  Surgeon: Daneil Dolin, MD;  [facility];  Service: Endoscopy;  Laterality: N/A;  12:30pm   HAND SURGERY     right   implantable loop recorder removal  02/17/2018   MDT LINQ removed by Dr Rayann Heman for end of servce   NECK SURGERY     right hand reconstruction      Family History  Problem Relation Age of Onset    Cervical cancer Mother    Heart failure Father    Lung cancer Sister    Breast cancer Sister    Colon cancer Neg Hx    Colon polyps Neg Hx     Social history:  reports that he has been smoking cigarettes. He started smoking about 56 years ago. He has a 45.00 pack-year smoking history. He has never used smokeless tobacco. He reports that he does not drink alcohol or use drugs.  Medications:  Prior to Admission medications   Medication Sig Start Date End Date Taking? Authorizing Provider  alendronate (FOSAMAX) 10 MG tablet Take 10 mg by mouth daily before breakfast. Take with a full glass of water on an empty stomach.   Yes [provider]  ALPRAZolam (XANAX) 0.25 MG tablet Take 0.25 mg by mouth daily as needed for anxiety.    Yes [provider]  amitriptyline (ELAVIL) 25 MG tablet Take 25 mg by mouth at bedtime.   Yes [provider]  aspirin EC 81 MG tablet Take 81 mg by mouth daily.   Yes [provider]  Cholecalciferol (VITAMIN D) 2000 units CAPS Take 2,000 Units by mouth daily.    Yes [provider]  clopidogrel (PLAVIX) 75 MG tablet Take 75 mg by mouth daily.    Yes [provider]  divalproex (DEPAKOTE ER) 500 MG 24 hr tablet Take 3 tablets (1,500 mg total) by mouth daily. Patient taking differently: Take 1,000 mg by mouth daily.  03/29/18  Yes Ward Givens, NP  docusate sodium (COLACE) 100 MG capsule Take 100 mg by mouth 2 (two) times daily.    Yes [provider]  DULoxetine (CYMBALTA) 30 MG capsule Take 90 mg by mouth daily.   Yes [provider]  gabapentin (NEURONTIN) 300 MG capsule Take 600 mg by mouth 3 (three) times daily.   Yes [provider]  hydrALAZINE (APRESOLINE) 25 MG tablet Take 25 mg by mouth 2 (two) times daily. Sometimes holds if bp is low   Yes [provider]  ibuprofen (ADVIL,MOTRIN) 200 MG tablet Take 400 mg by mouth daily as needed for moderate pain.   Yes  [provider]  lisinopril (PRINIVIL,ZESTRIL) 40 MG tablet Take 40 mg by mouth daily.   Yes [provider]  megestrol (MEGACE) 20 MG tablet Take 20 mg by mouth daily.   Yes [provider]  Menthol-Methyl Salicylate (MUSCLE RUB EX) Apply 1 application topically as needed (leg and back pain).   Yes [provider]  methimazole (TAPAZOLE) 5 MG tablet Take 5 mg by mouth daily.   Yes [provider]  Oxycodone HCl 10 MG TABS  09/10/18  Yes [provider]  polyethylene glycol powder (GLYCOLAX/MIRALAX) powder Take one capful every evening on days you do not have a good bowel movement. 06/25/17  Yes Mahala Menghini,  PA-C  Tamsulosin HCl (FLOMAX) 0.4 MG CAPS Take 0.4 mg by mouth every evening.    Yes [provider]  traZODone (DESYREL) 100 MG tablet Take 100 mg by mouth at bedtime as needed for sleep.    Yes [provider]  albuterol (PROVENTIL HFA;VENTOLIN HFA) 108 (90 Base) MCG/ACT inhaler Inhale 2 puffs into the lungs every 6 (six) hours as needed for wheezing or shortness of breath. Patient not taking: Reported on 09/22/2018 01/31/16   Arnoldo Lenis, MD      Allergies  Allergen Reactions   Keppra [Levetiracetam] Anaphylaxis    Suicidal ideation   Adhesive [Tape]     Severe rash   Methadone     Delerium   Morphine And Related Nausea And Vomiting    Can only take through IV.   Tramadol     Caused seizures    ROS:  Out of a complete 14 system review of symptoms, the patient complains only of the following symptoms, and all other reviewed systems are negative.  Confusion, hallucinations Walking problems Urinary and fecal incontinence  Blood pressure 100/67, pulse 91, height 5\' 8"  (1.727 m), weight 117 lb 5 oz (53.2 kg).  Physical Exam  General: The patient is alert and cooperative at the time of the examination.  Eyes: Pupils are equal, round, and reactive to light. Discs are flat  bilaterally.  Neck: The neck is supple, no carotid bruits are noted.  Respiratory: The respiratory examination is clear.  Cardiovascular: The cardiovascular examination reveals a regular rate and rhythm, no obvious murmurs or rubs are noted.  Skin: Extremities are with trace edema at the ankles bilaterally.  Neurologic Exam  Mental status: The patient is alert and oriented x 3 at the time of the examination. The patient has a flat affect, he is able to follow commands and respond appropriately.  Cranial nerves: Facial symmetry is present. There is good sensation of the face to pinprick and soft touch bilaterally. The strength of the facial muscles and the muscles to head turning and shoulder shrug are normal bilaterally. Speech is well enunciated, no aphasia or dysarthria is noted. Extraocular movements are full. Visual fields are full. The tongue is midline, and the patient has symmetric elevation of the soft palate. No obvious hearing deficits are noted.  Motor: The motor testing reveals 5 over 5 strength of all 4 extremities. Good symmetric motor tone is noted throughout.  Sensory: Sensory testing is intact to pinprick, soft touch, vibration sensation, and position sense on all 4 extremities, with exception of some decrease in vibration sensation on the left foot as compared to the right. No evidence of extinction is noted.  Coordination: Cerebellar testing reveals good finger-nose-finger bilaterally.  The patient has mild dysmetria with heel-to-shin bilaterally.  Gait and station: Gait is slightly wide-based, the patient can walk with assistance, Romberg is negative.  Tandem gait was not attempted.  Reflexes: Deep tendon reflexes are relatively symmetric in the arms, the patient has increased left knee jerk reflexes compared to the right and increased ankle jerk reflex as compared to the right.  Toes are neutral bilaterally.   Assessment/Plan:  1.  History of cerebrovascular disease,  left hemiparesis  2.  New onset right-sided symptoms, worsening gait and mentation  3.  Chronic pain syndrome, left sided pain associated with stroke  4.  Dementia  5.  History of seizures  The patient will undergo further blood work today, we will recheck a Depakote level and ammonia level,  urinalysis.  The patient will have a CT scan of the brain and carotid Doppler studies, the patient currently is on aspirin and Plavix.  The patient was started on amitriptyline about a week prior to onset of symptoms, the anticholinergic effects of this medication could be a factor in some of the confusion issues, hallucinations, and vivid dreams at night.  He will be reduced on the amitriptyline taking 10 mg at night for 2 weeks and then stop the drug.  If not already tried in the future, the use of carbamazepine or Trileptal could be done for the pain syndrome, he was on Trileptal previously but stopped the drug for some reason.  We will get home health physical therapy for the worsening gait problem.  The patient will follow-up in 6 months.   Jill Alexanders MD 09/22/2018 10:32 AM  [facility] [address] [place], [place] [address]  Phone [phone] Fax [phone]

## 2018-09-22 NOTE — Patient Instructions (Signed)
We will go down on the amitriptyline to 10 mg at night for 2 weeks, then stop.

## 2018-09-23 ENCOUNTER — Telehealth: Payer: Self-pay | Admitting: Neurology

## 2018-09-23 LAB — COMPREHENSIVE METABOLIC PANEL
ALT: 9 IU/L (ref 0–44)
AST: 18 IU/L (ref 0–40)
Albumin/Globulin Ratio: 2 (ref 1.2–2.2)
Albumin: 4.1 g/dL (ref 3.8–4.8)
Alkaline Phosphatase: 49 IU/L (ref 39–117)
BUN/Creatinine Ratio: 17 (ref 10–24)
BUN: 12 mg/dL (ref 8–27)
Bilirubin Total: 0.7 mg/dL (ref 0.0–1.2)
CO2: 21 mmol/L (ref 20–29)
Calcium: 9 mg/dL (ref 8.6–10.2)
Chloride: 98 mmol/L (ref 96–106)
Creatinine, Ser: 0.72 mg/dL — ABNORMAL LOW (ref 0.76–1.27)
GFR calc Af Amer: 111 mL/min/{1.73_m2} (ref >59)
GFR calc non Af Amer: 96 mL/min/{1.73_m2} (ref >59)
Globulin, Total: 2.1 g/dL (ref 1.5–4.5)
Glucose: 91 mg/dL (ref 65–99)
Potassium: 4.5 mmol/L (ref 3.5–5.2)
Sodium: 141 mmol/L (ref 134–144)
Total Protein: 6.2 g/dL (ref 6.0–8.5)

## 2018-09-23 LAB — CBC WITH DIFFERENTIAL/PLATELET
Basophils Absolute: 0.1 10*3/uL (ref 0.0–0.2)
Basos: 1 %
EOS (ABSOLUTE): 0.1 10*3/uL (ref 0.0–0.4)
Eos: 1 %
Hematocrit: 40.6 % (ref 37.5–51.0)
Hemoglobin: 14.3 g/dL (ref 13.0–17.7)
Immature Grans (Abs): 0 10*3/uL (ref 0.0–0.1)
Immature Granulocytes: 0 %
Lymphocytes Absolute: 2 10*3/uL (ref 0.7–3.1)
Lymphs: 34 %
MCH: 31.4 pg (ref 26.6–33.0)
MCHC: 35.2 g/dL (ref 31.5–35.7)
MCV: 89 fL (ref 79–97)
Monocytes Absolute: 0.6 10*3/uL (ref 0.1–0.9)
Monocytes: 10 %
Neutrophils Absolute: 3.2 10*3/uL (ref 1.4–7.0)
Neutrophils: 54 %
Platelets: 201 10*3/uL (ref 150–450)
RBC: 4.55 x10E6/uL (ref 4.14–5.80)
RDW: 13 % (ref 11.6–15.4)
WBC: 5.9 10*3/uL (ref 3.4–10.8)

## 2018-09-23 LAB — VALPROIC ACID LEVEL: Valproic Acid Lvl: 70 ug/mL (ref 50–100)

## 2018-09-23 LAB — AMMONIA: Ammonia: 60 ug/dL (ref 40–200)

## 2018-09-23 NOTE — Telephone Encounter (Signed)
[facility] medicare/VA order sent to [facility]. No auth they will reach out to the pt to schedule.

## 2018-09-24 ENCOUNTER — Telehealth: Payer: Self-pay | Admitting: *Deleted

## 2018-09-24 DIAGNOSIS — G894 Chronic pain syndrome: Secondary | ICD-10-CM | POA: Diagnosis not present

## 2018-09-24 DIAGNOSIS — G890 Central pain syndrome: Secondary | ICD-10-CM | POA: Diagnosis not present

## 2018-09-24 DIAGNOSIS — Z79891 Long term (current) use of opiate analgesic: Secondary | ICD-10-CM | POA: Diagnosis not present

## 2018-09-24 DIAGNOSIS — I6930 Unspecified sequelae of cerebral infarction: Secondary | ICD-10-CM | POA: Diagnosis not present

## 2018-09-24 DIAGNOSIS — R300 Dysuria: Secondary | ICD-10-CM | POA: Diagnosis not present

## 2018-09-24 DIAGNOSIS — Z79899 Other long term (current) drug therapy: Secondary | ICD-10-CM | POA: Diagnosis not present

## 2018-09-24 NOTE — Telephone Encounter (Signed)
error 

## 2018-09-25 LAB — URINALYSIS, ROUTINE W REFLEX MICROSCOPIC
Bilirubin, UA: NEGATIVE
Glucose, UA: NEGATIVE
Ketones, UA: NEGATIVE
Leukocytes,UA: NEGATIVE
Nitrite, UA: NEGATIVE
RBC, UA: NEGATIVE
Specific Gravity, UA: 1.026 (ref 1.005–1.030)
Urobilinogen, Ur: 1 mg/dL (ref 0.2–1.0)
pH, UA: 6 (ref 5.0–7.5)

## 2018-09-27 ENCOUNTER — Telehealth: Payer: Self-pay

## 2018-09-27 ENCOUNTER — Telehealth: Payer: Self-pay | Admitting: Neurology

## 2018-09-27 DIAGNOSIS — K7460 Unspecified cirrhosis of liver: Secondary | ICD-10-CM | POA: Diagnosis not present

## 2018-09-27 DIAGNOSIS — I69354 Hemiplegia and hemiparesis following cerebral infarction affecting left non-dominant side: Secondary | ICD-10-CM | POA: Diagnosis not present

## 2018-09-27 DIAGNOSIS — G894 Chronic pain syndrome: Secondary | ICD-10-CM | POA: Diagnosis not present

## 2018-09-27 DIAGNOSIS — I951 Orthostatic hypotension: Secondary | ICD-10-CM | POA: Diagnosis not present

## 2018-09-27 DIAGNOSIS — Z9181 History of falling: Secondary | ICD-10-CM | POA: Diagnosis not present

## 2018-09-27 DIAGNOSIS — Z7902 Long term (current) use of antithrombotics/antiplatelets: Secondary | ICD-10-CM | POA: Diagnosis not present

## 2018-09-27 DIAGNOSIS — I69398 Other sequelae of cerebral infarction: Secondary | ICD-10-CM | POA: Diagnosis not present

## 2018-09-27 DIAGNOSIS — I10 Essential (primary) hypertension: Secondary | ICD-10-CM | POA: Diagnosis not present

## 2018-09-27 DIAGNOSIS — I69351 Hemiplegia and hemiparesis following cerebral infarction affecting right dominant side: Secondary | ICD-10-CM | POA: Diagnosis not present

## 2018-09-27 NOTE — Telephone Encounter (Signed)
-----   Message from Kathrynn Ducking, MD sent at 09/27/2018  7:12 AM EDT ----- The urinalysis was unremarkable, no evidence of bladder infection.  Please call the patient. ----- Message ----- From: Lavone Neri Lab Results In Sent: 09/23/2018   7:36 AM EDT To: Kathrynn Ducking, MD

## 2018-09-27 NOTE — Telephone Encounter (Signed)
I contacted the pt and left a vm (ok per dpr) and advised UA was unremarkable and no evidence of bladder infection was present.

## 2018-09-27 NOTE — Telephone Encounter (Signed)
I called the number, gave verbal orders for physical therapy.

## 2018-09-27 NOTE — Telephone Encounter (Signed)
Beverlee Nims is calling in for a verbal order to start PT for today for the patient  CB# [phone]

## 2018-09-27 NOTE — Telephone Encounter (Signed)
Pt wife(on DPR) has called re: pt's amitriptyline (ELAVIL) 10 MG tablet, she states pt has refused to take his medication for 3 days.  Wife states he has actually been more alert and getting out of bed.  Wife's concern is if she does give it to him will it set him back.  Wife is asking for a call to discuss if she should give the medication.

## 2018-09-28 NOTE — Telephone Encounter (Signed)
I contacted sean and provided verbal order for PT. He states when completing his pt evaluation with the pt it was mentioned the pt had been refusing all of his medications except the pain meds.  I contacted the pt's wife to f/u on this and she states over the weekend the pt was refusing all of his meds but has started back on his medications since 09/22/18. Pt did refuse his amitriptyline 25 mg but dr. Eugenie Birks advised the pt's wife this am and agreed to stopping this medication.

## 2018-09-28 NOTE — Telephone Encounter (Signed)
I called the wife.  The patient has refused to take the amitriptyline, we were tapering him off the medication anyway, okay to stop the drug.

## 2018-09-28 NOTE — Telephone Encounter (Signed)
Eric Tran from [facility] is calling in wanting to know if he can get a verbal order for 2 x week for 3 weeks PT for balance and gait, he also stated that he hasnt been taking any meds for the past 2 days , and there is an interaction with clopidogrel (PLAVIX) 75 MG tablet and ibuprofen (ADVIL,MOTRIN) 200 MG tablet  CB# [phone]

## 2018-09-28 NOTE — Addendum Note (Signed)
Addended by: Kathrynn Ducking on: 09/28/2018 07:42 AM   Modules accepted: Orders

## 2018-09-30 ENCOUNTER — Telehealth: Payer: Self-pay | Admitting: Neurology

## 2018-09-30 DIAGNOSIS — I69351 Hemiplegia and hemiparesis following cerebral infarction affecting right dominant side: Secondary | ICD-10-CM | POA: Diagnosis not present

## 2018-09-30 DIAGNOSIS — K7460 Unspecified cirrhosis of liver: Secondary | ICD-10-CM | POA: Diagnosis not present

## 2018-09-30 DIAGNOSIS — I69398 Other sequelae of cerebral infarction: Secondary | ICD-10-CM | POA: Diagnosis not present

## 2018-09-30 DIAGNOSIS — Z9181 History of falling: Secondary | ICD-10-CM | POA: Diagnosis not present

## 2018-09-30 DIAGNOSIS — I10 Essential (primary) hypertension: Secondary | ICD-10-CM | POA: Diagnosis not present

## 2018-09-30 DIAGNOSIS — I951 Orthostatic hypotension: Secondary | ICD-10-CM | POA: Diagnosis not present

## 2018-09-30 DIAGNOSIS — G894 Chronic pain syndrome: Secondary | ICD-10-CM | POA: Diagnosis not present

## 2018-09-30 DIAGNOSIS — Z7902 Long term (current) use of antithrombotics/antiplatelets: Secondary | ICD-10-CM | POA: Diagnosis not present

## 2018-09-30 DIAGNOSIS — I69354 Hemiplegia and hemiparesis following cerebral infarction affecting left non-dominant side: Secondary | ICD-10-CM | POA: Diagnosis not present

## 2018-09-30 NOTE — Telephone Encounter (Signed)
Noted I sent a message to GI.

## 2018-09-30 NOTE — Telephone Encounter (Signed)
[name] the PT called wanting to inform Eric Tran that the pt had a low BP reading. Pt states pt was orthostatic with bp readings: Sitting 126/80 and Standing hard to get but was roughly 90/50. Due to the low BP standing the pt has suffered 2 falls and feeling dizzy. Wife states he had a possible seizure on Tues.

## 2018-09-30 NOTE — Telephone Encounter (Signed)
I called the wife.  The patient is had some orthostasis documented by physical therapy, he has had a possible seizure last evening, but he had refused to take his Depakote for couple days.  If needed, the Depakote can be increased if he continues to have seizures while taking the medication.  They have not heard nothing about scheduling the CT, I will find out what is going on with this.

## 2018-10-04 DIAGNOSIS — Z87820 Personal history of traumatic brain injury: Secondary | ICD-10-CM | POA: Diagnosis not present

## 2018-10-04 DIAGNOSIS — G459 Transient cerebral ischemic attack, unspecified: Secondary | ICD-10-CM | POA: Diagnosis not present

## 2018-10-04 DIAGNOSIS — J449 Chronic obstructive pulmonary disease, unspecified: Secondary | ICD-10-CM | POA: Diagnosis not present

## 2018-10-04 NOTE — Telephone Encounter (Signed)
Patient is scheduled for 10/08/18 at GI.. the patient requested that date.

## 2018-10-05 DIAGNOSIS — Z7902 Long term (current) use of antithrombotics/antiplatelets: Secondary | ICD-10-CM | POA: Diagnosis not present

## 2018-10-05 DIAGNOSIS — I69351 Hemiplegia and hemiparesis following cerebral infarction affecting right dominant side: Secondary | ICD-10-CM | POA: Diagnosis not present

## 2018-10-05 DIAGNOSIS — G894 Chronic pain syndrome: Secondary | ICD-10-CM | POA: Diagnosis not present

## 2018-10-05 DIAGNOSIS — I69398 Other sequelae of cerebral infarction: Secondary | ICD-10-CM | POA: Diagnosis not present

## 2018-10-05 DIAGNOSIS — I10 Essential (primary) hypertension: Secondary | ICD-10-CM | POA: Diagnosis not present

## 2018-10-05 DIAGNOSIS — I951 Orthostatic hypotension: Secondary | ICD-10-CM | POA: Diagnosis not present

## 2018-10-05 DIAGNOSIS — I69354 Hemiplegia and hemiparesis following cerebral infarction affecting left non-dominant side: Secondary | ICD-10-CM | POA: Diagnosis not present

## 2018-10-05 DIAGNOSIS — Z9181 History of falling: Secondary | ICD-10-CM | POA: Diagnosis not present

## 2018-10-05 DIAGNOSIS — K7460 Unspecified cirrhosis of liver: Secondary | ICD-10-CM | POA: Diagnosis not present

## 2018-10-05 MED ORDER — MIDODRINE HCL 2.5 MG PO TABS: ORAL_TABLET | ORAL | 3 refills | Status: DC

## 2018-10-05 NOTE — Telephone Encounter (Signed)
Eric Tran from [facility] is calling in to report patients blood pressure being 124/88 lying down and standing 80/64

## 2018-10-05 NOTE — Addendum Note (Signed)
Addended by: Kathrynn Ducking on: 10/05/2018 02:48 PM   Modules accepted: Orders

## 2018-10-05 NOTE — Telephone Encounter (Signed)
The patient apparently has had documented orthostasis, dropping almost 45 points from lying down to standing.  If the patient is symptomatic from this, we may add low-dose midodrine, I have tried to call the wife, I will try to call back later, unable to leave a message.

## 2018-10-05 NOTE — Telephone Encounter (Signed)
I called the wife.  The patient is still symptomatic with dizziness and weakness when he stands up.  I will go ahead and call in a low-dose of the midodrine prescription taking 2.5 mg in the morning and at midday.  The patient is on hydralazine but the medication is being held frequently because of the blood pressures being too low.

## 2018-10-05 NOTE — Telephone Encounter (Signed)
I took call from phone staff. Pt's wife was returning MD's call. I was able to discuss message with wife. She states over the past month the pt has been experiencing orthostatic symptoms.   Wife reports when the patient stands up he complains of feeling lightheaded and dizzy. Wife denies the patient falling while in her presence but sates the patient has fallen several times without her being in the room and is unsure if it was related to his blood pressure.   Wife sates the best call back # is [phone].

## 2018-10-07 DIAGNOSIS — I69398 Other sequelae of cerebral infarction: Secondary | ICD-10-CM | POA: Diagnosis not present

## 2018-10-07 DIAGNOSIS — I10 Essential (primary) hypertension: Secondary | ICD-10-CM | POA: Diagnosis not present

## 2018-10-07 DIAGNOSIS — G894 Chronic pain syndrome: Secondary | ICD-10-CM | POA: Diagnosis not present

## 2018-10-07 DIAGNOSIS — I951 Orthostatic hypotension: Secondary | ICD-10-CM | POA: Diagnosis not present

## 2018-10-07 DIAGNOSIS — I69351 Hemiplegia and hemiparesis following cerebral infarction affecting right dominant side: Secondary | ICD-10-CM | POA: Diagnosis not present

## 2018-10-07 DIAGNOSIS — Z9181 History of falling: Secondary | ICD-10-CM | POA: Diagnosis not present

## 2018-10-07 DIAGNOSIS — I69354 Hemiplegia and hemiparesis following cerebral infarction affecting left non-dominant side: Secondary | ICD-10-CM | POA: Diagnosis not present

## 2018-10-07 DIAGNOSIS — K7460 Unspecified cirrhosis of liver: Secondary | ICD-10-CM | POA: Diagnosis not present

## 2018-10-07 DIAGNOSIS — Z7902 Long term (current) use of antithrombotics/antiplatelets: Secondary | ICD-10-CM | POA: Diagnosis not present

## 2018-10-08 ENCOUNTER — Other Ambulatory Visit: Payer: Self-pay

## 2018-10-08 ENCOUNTER — Ambulatory Visit
Admission: RE | Admit: 2018-10-08 | Discharge: 2018-10-08 | Disposition: A | Discharge status: Home / Self Care | Payer: Medicare Other | Source: Ambulatory Visit | Attending: Neurology | Admitting: Neurology

## 2018-10-08 DIAGNOSIS — I639 Cerebral infarction, unspecified: Secondary | ICD-10-CM

## 2018-10-11 ENCOUNTER — Telehealth: Payer: Self-pay | Admitting: Neurology

## 2018-10-11 DIAGNOSIS — I69398 Other sequelae of cerebral infarction: Secondary | ICD-10-CM | POA: Diagnosis not present

## 2018-10-11 DIAGNOSIS — K7460 Unspecified cirrhosis of liver: Secondary | ICD-10-CM | POA: Diagnosis not present

## 2018-10-11 DIAGNOSIS — I951 Orthostatic hypotension: Secondary | ICD-10-CM | POA: Diagnosis not present

## 2018-10-11 DIAGNOSIS — I10 Essential (primary) hypertension: Secondary | ICD-10-CM | POA: Diagnosis not present

## 2018-10-11 DIAGNOSIS — G894 Chronic pain syndrome: Secondary | ICD-10-CM | POA: Diagnosis not present

## 2018-10-11 DIAGNOSIS — Z9181 History of falling: Secondary | ICD-10-CM | POA: Diagnosis not present

## 2018-10-11 DIAGNOSIS — Z7902 Long term (current) use of antithrombotics/antiplatelets: Secondary | ICD-10-CM | POA: Diagnosis not present

## 2018-10-11 DIAGNOSIS — I69351 Hemiplegia and hemiparesis following cerebral infarction affecting right dominant side: Secondary | ICD-10-CM | POA: Diagnosis not present

## 2018-10-11 DIAGNOSIS — I69354 Hemiplegia and hemiparesis following cerebral infarction affecting left non-dominant side: Secondary | ICD-10-CM | POA: Diagnosis not present

## 2018-10-11 NOTE — Telephone Encounter (Signed)
Amy from [facility] is calling in stating she is requesting Verbal Orders for Home Health Nurse because of his BP , and hold Home Health PT  Cb# [phone]

## 2018-10-12 ENCOUNTER — Telehealth: Payer: Self-pay | Admitting: Cardiology

## 2018-10-12 ENCOUNTER — Other Ambulatory Visit: Payer: Self-pay | Admitting: Gastroenterology

## 2018-10-12 DIAGNOSIS — K869 Disease of pancreas, unspecified: Secondary | ICD-10-CM

## 2018-10-12 DIAGNOSIS — J449 Chronic obstructive pulmonary disease, unspecified: Secondary | ICD-10-CM

## 2018-10-12 NOTE — Telephone Encounter (Signed)
I will forward to Dr.Branch for advice

## 2018-10-12 NOTE — Telephone Encounter (Signed)
Patient;s wife call stating that patient is wanting to be referred to doctors in [place] for [place] COPD.

## 2018-10-13 ENCOUNTER — Telehealth: Payer: Self-pay | Admitting: Neurology

## 2018-10-13 MED ORDER — MIDODRINE HCL 2.5 MG PO TABS: ORAL_TABLET | ORAL | 3 refills | Status: DC

## 2018-10-13 NOTE — Telephone Encounter (Signed)
I called and talked with the family, CT of the head shows no changes from June 2019.  The patient is now running high blood pressures on the midodrine, they are to cut back to taking 2.5 mg only in the morning, if the blood pressures continue to run high at this medication will be stopped and be given only on an as-needed basis.   CT head 10/10/18:  IMPRESSION: This CT scan of the head without contrast shows the following: 1.   Multiple infratentorial and supratentorial chronic lacunar infarctions as detailed above, unchanged compared to the 12/07/2017 CT scan 2.   Moderate chronic microvascular ischemic changes.  Mild generalized cortical atrophy. 3.   There are no acute findings and no definite changes compared to the 12/07/2017 CT scan.

## 2018-10-13 NOTE — Telephone Encounter (Signed)
FYI  I contacted Amy with [facility] and I provided verbal order for to hold PT and for home health nurse.   Amy PT reports the pt's blood pressure is not regulated and when she attempts to complete physical therapy exercises the pt's blood pressure will drop.  She did not report what the actual readings were when BP would drop.

## 2018-10-13 NOTE — Telephone Encounter (Signed)
Ok to refer to pulmonary in [place] for COPD   [name] MD

## 2018-10-14 NOTE — Telephone Encounter (Signed)
Wife notified referral placed

## 2018-10-15 DIAGNOSIS — Z9181 History of falling: Secondary | ICD-10-CM | POA: Diagnosis not present

## 2018-10-15 DIAGNOSIS — Z7902 Long term (current) use of antithrombotics/antiplatelets: Secondary | ICD-10-CM | POA: Diagnosis not present

## 2018-10-15 DIAGNOSIS — G894 Chronic pain syndrome: Secondary | ICD-10-CM | POA: Diagnosis not present

## 2018-10-15 DIAGNOSIS — I69354 Hemiplegia and hemiparesis following cerebral infarction affecting left non-dominant side: Secondary | ICD-10-CM | POA: Diagnosis not present

## 2018-10-15 DIAGNOSIS — K7460 Unspecified cirrhosis of liver: Secondary | ICD-10-CM | POA: Diagnosis not present

## 2018-10-15 DIAGNOSIS — I10 Essential (primary) hypertension: Secondary | ICD-10-CM | POA: Diagnosis not present

## 2018-10-15 DIAGNOSIS — I951 Orthostatic hypotension: Secondary | ICD-10-CM | POA: Diagnosis not present

## 2018-10-15 DIAGNOSIS — I69351 Hemiplegia and hemiparesis following cerebral infarction affecting right dominant side: Secondary | ICD-10-CM | POA: Diagnosis not present

## 2018-10-15 DIAGNOSIS — I69398 Other sequelae of cerebral infarction: Secondary | ICD-10-CM | POA: Diagnosis not present

## 2018-10-18 ENCOUNTER — Ambulatory Visit: Payer: Medicare Other | Admitting: Gastroenterology

## 2018-10-19 DIAGNOSIS — Z7902 Long term (current) use of antithrombotics/antiplatelets: Secondary | ICD-10-CM | POA: Diagnosis not present

## 2018-10-19 DIAGNOSIS — I69354 Hemiplegia and hemiparesis following cerebral infarction affecting left non-dominant side: Secondary | ICD-10-CM | POA: Diagnosis not present

## 2018-10-19 DIAGNOSIS — Z9181 History of falling: Secondary | ICD-10-CM | POA: Diagnosis not present

## 2018-10-19 DIAGNOSIS — I69398 Other sequelae of cerebral infarction: Secondary | ICD-10-CM | POA: Diagnosis not present

## 2018-10-19 DIAGNOSIS — I69351 Hemiplegia and hemiparesis following cerebral infarction affecting right dominant side: Secondary | ICD-10-CM | POA: Diagnosis not present

## 2018-10-19 DIAGNOSIS — G894 Chronic pain syndrome: Secondary | ICD-10-CM | POA: Diagnosis not present

## 2018-10-19 DIAGNOSIS — K7460 Unspecified cirrhosis of liver: Secondary | ICD-10-CM | POA: Diagnosis not present

## 2018-10-19 DIAGNOSIS — I951 Orthostatic hypotension: Secondary | ICD-10-CM | POA: Diagnosis not present

## 2018-10-19 DIAGNOSIS — I10 Essential (primary) hypertension: Secondary | ICD-10-CM | POA: Diagnosis not present

## 2018-10-20 ENCOUNTER — Ambulatory Visit
Admission: RE | Admit: 2018-10-20 | Discharge: 2018-10-20 | Disposition: A | Discharge status: Home / Self Care | Payer: Medicare Other | Source: Ambulatory Visit | Attending: Gastroenterology | Admitting: Gastroenterology

## 2018-10-20 ENCOUNTER — Telehealth: Payer: Self-pay

## 2018-10-20 ENCOUNTER — Other Ambulatory Visit: Payer: Self-pay

## 2018-10-20 DIAGNOSIS — K869 Disease of pancreas, unspecified: Secondary | ICD-10-CM | POA: Diagnosis not present

## 2018-10-20 MED ORDER — GADOBENATE DIMEGLUMINE 529 MG/ML IV SOLN
10.0000 mL | Freq: Once | INTRAVENOUS | Status: AC | PRN
  Administered 2018-10-20: 10 mL via INTRAVENOUS

## 2018-10-20 NOTE — Telephone Encounter (Signed)
See result note to follow.

## 2018-10-20 NOTE — Telephone Encounter (Signed)
noted 

## 2018-10-20 NOTE — Telephone Encounter (Signed)
Received a call from [facility] from [facility]. Pt's MR report has resulted. Pt has a 1/9 cm lesion in the lt hepatic lobe.

## 2018-10-21 ENCOUNTER — Ambulatory Visit (HOSPITAL_COMMUNITY): Payer: Medicare Other

## 2018-10-21 DIAGNOSIS — K7460 Unspecified cirrhosis of liver: Secondary | ICD-10-CM | POA: Diagnosis not present

## 2018-10-21 DIAGNOSIS — I69354 Hemiplegia and hemiparesis following cerebral infarction affecting left non-dominant side: Secondary | ICD-10-CM | POA: Diagnosis not present

## 2018-10-21 DIAGNOSIS — I69398 Other sequelae of cerebral infarction: Secondary | ICD-10-CM | POA: Diagnosis not present

## 2018-10-21 DIAGNOSIS — Z9181 History of falling: Secondary | ICD-10-CM | POA: Diagnosis not present

## 2018-10-21 DIAGNOSIS — Z7902 Long term (current) use of antithrombotics/antiplatelets: Secondary | ICD-10-CM | POA: Diagnosis not present

## 2018-10-21 DIAGNOSIS — I69351 Hemiplegia and hemiparesis following cerebral infarction affecting right dominant side: Secondary | ICD-10-CM | POA: Diagnosis not present

## 2018-10-21 DIAGNOSIS — I951 Orthostatic hypotension: Secondary | ICD-10-CM | POA: Diagnosis not present

## 2018-10-21 DIAGNOSIS — I10 Essential (primary) hypertension: Secondary | ICD-10-CM | POA: Diagnosis not present

## 2018-10-21 DIAGNOSIS — G894 Chronic pain syndrome: Secondary | ICD-10-CM | POA: Diagnosis not present

## 2018-10-22 ENCOUNTER — Other Ambulatory Visit: Payer: Self-pay

## 2018-10-22 DIAGNOSIS — R634 Abnormal weight loss: Secondary | ICD-10-CM

## 2018-10-25 ENCOUNTER — Ambulatory Visit: Payer: Self-pay | Admitting: Gastroenterology

## 2018-10-26 ENCOUNTER — Telehealth: Payer: Self-pay | Admitting: Cardiology

## 2018-10-26 DIAGNOSIS — I6930 Unspecified sequelae of cerebral infarction: Secondary | ICD-10-CM | POA: Diagnosis not present

## 2018-10-26 DIAGNOSIS — G890 Central pain syndrome: Secondary | ICD-10-CM | POA: Diagnosis not present

## 2018-10-26 DIAGNOSIS — I10 Essential (primary) hypertension: Secondary | ICD-10-CM | POA: Diagnosis not present

## 2018-10-26 DIAGNOSIS — I69351 Hemiplegia and hemiparesis following cerebral infarction affecting right dominant side: Secondary | ICD-10-CM | POA: Diagnosis not present

## 2018-10-26 DIAGNOSIS — K7460 Unspecified cirrhosis of liver: Secondary | ICD-10-CM | POA: Diagnosis not present

## 2018-10-26 DIAGNOSIS — Z79899 Other long term (current) drug therapy: Secondary | ICD-10-CM | POA: Diagnosis not present

## 2018-10-26 DIAGNOSIS — I69398 Other sequelae of cerebral infarction: Secondary | ICD-10-CM | POA: Diagnosis not present

## 2018-10-26 DIAGNOSIS — Z79891 Long term (current) use of opiate analgesic: Secondary | ICD-10-CM | POA: Diagnosis not present

## 2018-10-26 DIAGNOSIS — I951 Orthostatic hypotension: Secondary | ICD-10-CM | POA: Diagnosis not present

## 2018-10-26 DIAGNOSIS — I69354 Hemiplegia and hemiparesis following cerebral infarction affecting left non-dominant side: Secondary | ICD-10-CM | POA: Diagnosis not present

## 2018-10-26 DIAGNOSIS — G894 Chronic pain syndrome: Secondary | ICD-10-CM | POA: Diagnosis not present

## 2018-10-26 DIAGNOSIS — Z9181 History of falling: Secondary | ICD-10-CM | POA: Diagnosis not present

## 2018-10-26 DIAGNOSIS — Z7902 Long term (current) use of antithrombotics/antiplatelets: Secondary | ICD-10-CM | POA: Diagnosis not present

## 2018-10-26 NOTE — Telephone Encounter (Signed)
Per phone call from pt's wife, pt is having problems w/ his BP going up and dropping. His wife has them all recorded.   Please call [phone]

## 2018-10-26 NOTE — Telephone Encounter (Signed)
Pt wife says pt has been having home health come out (BP ranging from 162/96 - 93/68) had episode for the last 3 weeks of passing out/dizziness/orthostatic BP per Dr Jannifer Franklin (Epic) for changes in blood pressure and has been holding or changing doses of hydralazine and midodrine according to what BP is running. Pt had CT and MRI ordered by Dr Jannifer Franklin. Pt wife wanted to make [name] aware. Pt has f/u with [name] in May and wife wanted to know if pt needs to be seen sooner via telehealth.

## 2018-10-28 ENCOUNTER — Encounter: Payer: Self-pay | Admitting: Gastroenterology

## 2018-10-28 ENCOUNTER — Other Ambulatory Visit: Payer: Self-pay

## 2018-10-28 ENCOUNTER — Ambulatory Visit (INDEPENDENT_AMBULATORY_CARE_PROVIDER_SITE_OTHER): Payer: Medicare Other | Admitting: Gastroenterology

## 2018-10-28 DIAGNOSIS — E782 Mixed hyperlipidemia: Secondary | ICD-10-CM | POA: Diagnosis not present

## 2018-10-28 DIAGNOSIS — K7460 Unspecified cirrhosis of liver: Secondary | ICD-10-CM

## 2018-10-28 DIAGNOSIS — K769 Liver disease, unspecified: Secondary | ICD-10-CM | POA: Insufficient documentation

## 2018-10-28 DIAGNOSIS — I10 Essential (primary) hypertension: Secondary | ICD-10-CM | POA: Diagnosis not present

## 2018-10-28 DIAGNOSIS — R634 Abnormal weight loss: Secondary | ICD-10-CM | POA: Diagnosis not present

## 2018-10-28 NOTE — Patient Instructions (Signed)
1. Please have your blood work done at [facility].  We will contact you with results as available. 2. We will plan to repeat CT liver towards the end of July.  Office visit to follow. 3. Call if you have any questions or concerns in the interim.

## 2018-10-28 NOTE — Progress Notes (Signed)
Primary Care Physician:  Rosalee Kaufman, PA-C Primary GI:  Garfield Cornea, MD   Patient Location: Home  Provider Location: [facility] office  Reason for Visit: Weight loss, anorexia, early satiety  Persons present on the virtual encounter, with roles: Patient, myself (provider), Zara Council, LPN (updated meds and allergies)  Total time (minutes) spent on medical discussion: 25 minutes  Due to COVID-19, visit was conducted using [email or web address] method.  Visit was requested by patient.  Virtual Visit via [email or web address]  I connected with Eric Tran on 10/28/18 at  2:30 PM EDT by [email or web address] and verified that I am speaking with the correct person using two identifiers.   I discussed the limitations, risks, security and privacy concerns of performing an evaluation and management service by telephone/video and the availability of in person appointments. I also discussed with the patient that there may be a patient responsible charge related to this service. The patient expressed understanding and agreed to proceed.   HPI:   Eric Tran is a 68 y.o. male who presents for virtual visit regarding weight loss, anorexia, early satiety.  Patient was last seen in January.  He has a history of hepatitis C cirrhosis, status post hep C eradication.  History of failure to thrive, unintentional weight loss with extensive evaluation in the past.  History of multiple strokes.  Patient completed Cologuard testing in December 2017 (because of poor prep twice and no desire to attempt prep again on the patient's part).  Plans for repeat Cologuard November 2020.  EGD in September 2019 with portal hypertensive gastropathy.  Next EGD planned for September 2021.  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  Liver reported to be unremarkable.  Spleen normal in size.  Exam somewhat limited by respiratory motion.  Prior CT  abdomen July 2018 showed stable dilatation of the distal thoracic aorta measuring 3.9 cm, no significant narrowing of the visceral vasculature, stable tiny low-density lesions in the body of the pancreas.  Recent MRI abdomen/MRCP February 22 done to follow-up indeterminate hepatic lesion seen on CTA back in October.  Lesion measures 19 x 18 mm in the anterior aspect left hepatic lobe suspicious for hepatocellular carcinoma.  Exam limited to extensive respiratory motion.  Radiologist recommended follow-up CTA hepatic protocol in 3 months, Dr. Emerson Monte agreed with that plan.  Also requesting AFP for baseline.  Weighted 117 in march.  Patient and his wife believe his appetite has improved somewhat but not all that great.  When he eats he eats very small amounts and does so throughout the day.  Denies any postprandial abdominal pain.  Gastric emptying study earlier this year was normal.  No nausea or vomiting.  No heartburn.  Bowel movements have been regular.  He is having a lot of issues with his blood pressure.  Has been running high but then he developed significant orthostatic symptoms.  Patient's wife who is a nurse has been monitoring his blood pressure and coordination with home health.  Automatic cuff on his arm today with blood pressure 196/101 94/133 while lying.  She adds hydralazine as needed for elevated blood pressures.  Sometimes he has to take Midrin for orthostasis.   Current Outpatient Medications  Medication Sig Dispense Refill   albuterol (PROVENTIL HFA;VENTOLIN HFA) 108 (90 Base) MCG/ACT inhaler Inhale 2 puffs into the lungs every 6 (six) hours as needed for wheezing or shortness  of breath. 1 Inhaler 2   alendronate (FOSAMAX) 10 MG tablet Take 10 mg by mouth once a week. Take with a full glass of water on an empty stomach.      aspirin EC 81 MG tablet Take 81 mg by mouth daily.     Cholecalciferol (VITAMIN D) 2000 units CAPS Take 2,000 Units by mouth daily.      clopidogrel  (PLAVIX) 75 MG tablet Take 75 mg by mouth daily.      divalproex (DEPAKOTE ER) 500 MG 24 hr tablet Take 2 tablets (1,000 mg total) by mouth daily.     docusate sodium (COLACE) 100 MG capsule Take 100 mg by mouth 2 (two) times daily.      DULoxetine (CYMBALTA) 30 MG capsule Take 90 mg by mouth daily.     gabapentin (NEURONTIN) 300 MG capsule Take 600 mg by mouth 3 (three) times daily.     hydrALAZINE (APRESOLINE) 25 MG tablet Take 25 mg by mouth as needed. Sometimes holds if bp is low     lisinopril (PRINIVIL,ZESTRIL) 40 MG tablet Take 40 mg by mouth daily.     megestrol (MEGACE) 20 MG tablet Take 20 mg by mouth daily.     Melatonin 3 MG TABS Take 2 tablets by mouth as needed.     Menthol-Methyl Salicylate (MUSCLE RUB EX) Apply 1 application topically as needed (leg and back pain).     methimazole (TAPAZOLE) 5 MG tablet Take 5 mg by mouth daily.     midodrine (PROAMATINE) 2.5 MG tablet 1 tablet in the morning (Patient taking differently: as needed. ) 60 tablet 3   Oxycodone HCl 10 MG TABS Take 10 mg by mouth every 6 (six) hours as needed.      polyethylene glycol powder (GLYCOLAX/MIRALAX) powder Take one capful every evening on days you do not have a good bowel movement. (Patient taking differently: as needed. Take one capful every evening on days you do not have a good bowel movement.) 527 g 5   Tamsulosin HCl (FLOMAX) 0.4 MG CAPS Take 0.4 mg by mouth every evening.      traZODone (DESYREL) 100 MG tablet Take 100 mg by mouth at bedtime as needed for sleep.      No current facility-administered medications for this visit.       Past Surgical History:  Procedure Laterality Date   EP IMPLANTABLE DEVICE N/A 11/15/2014   Procedure: Loop Recorder Insertion;  Surgeon: Thompson Grayer, MD;  Location: [place] [facility];  Service: Cardiovascular;  Laterality: N/A;   ESOPHAGOGASTRODUODENOSCOPY (EGD) WITH PROPOFOL N/A 03/20/2016   Procedure: ESOPHAGOGASTRODUODENOSCOPY (EGD) WITH PROPOFOL;   Surgeon: Daneil Dolin, MD;  [facility];  Service: Endoscopy;  Laterality: N/A;   ESOPHAGOGASTRODUODENOSCOPY (EGD) WITH PROPOFOL N/A 03/22/2018   Dr. Emerson Monte: Portal hypertensive gastropathy, next EGD in 2 years.   HAND SURGERY     right   implantable loop recorder removal  02/17/2018   MDT LINQ removed by Dr Rayann Heman for end of servce   NECK SURGERY     right hand reconstruction       ROS:  General: Negative for anorexia, weight loss, fever, chills, fatigue, positive weakness.  Some dizziness with standing Eyes: Negative for vision changes.  ENT: Negative for hoarseness, difficulty swallowing , nasal congestion. CV: Negative for chest pain, angina, palpitations, dyspnea on exertion, peripheral edema.  Respiratory: Negative for dyspnea at rest, dyspnea on exertion, cough, sputum, wheezing.  GI: See history of present illness.  GU:  Negative for dysuria, hematuria, urinary incontinence, urinary frequency, nocturnal urination.  MS: Negative for joint pain, low back pain.  Derm: Negative for rash or itching.  Neuro: Negative for weakness, abnormal sensation, seizure, frequent headaches, memory loss, confusion.  Psych: Negative for anxiety, depression, suicidal ideation, hallucinations.  Endo: Negative for unusual weight change.  Heme: Negative for bruising or bleeding. Allergy: Negative for rash or hives.   Observations/Objective: Pleasant thin Caucasian male resting comfortably in bed.  Answers questions appropriately.  Wife at bedside assisting with history.  Patient is in no acute distress.  Lab Results  Component Value Date   CREATININE 0.72 (L) 09/22/2018   BUN 12 09/22/2018   NA 141 09/22/2018   K 4.5 09/22/2018   CL 98 09/22/2018   CO2 21 09/22/2018   Lab Results  Component Value Date   ALT 9 09/22/2018   AST 18 09/22/2018   ALKPHOS 49 09/22/2018   BILITOT 0.7 09/22/2018   Lab Results  Component Value Date   WBC 5.9 09/22/2018   HGB 14.3 09/22/2018    HCT 40.6 09/22/2018   MCV 89 09/22/2018   PLT 201 09/22/2018   Lab Results  Component Value Date   INR 1.0 07/06/2017   INR 1.1 02/19/2016   INR 0.92 05/01/2011      Assessment and Plan: Pleasant 68 year old gentleman with history of HCV cirrhosis, status post HCV eradication, chronic anorexia/weight loss/failure to thrive presenting for follow-up.  Extensive evaluation in the past as outlined above.  Most recently he had an MRI abdomen to follow-up on liver lesion seen at CTA back in the fall.  Liver lesion is stable but indeterminate, concerning for HCC.  Radiology recommending 74-month follow-up CT liver protocol to try to better determine what this lesion is as there was significant motion artifact on MRI.  I have discussed findings with Dr. Gala Romney.  He agrees with plan for 20-month follow-up CT liver with liver protocol.  Plans to obtain baseline AFP now.  I discussed these findings at length with patient and his wife today.  We discussed this could be [place] but there is not enough information yet to determine it.  If AFP is significantly elevated, would consider urgent management now.  If AFP is normal, will plan for 24-month follow-up CT.    Previous pancreatic lesions, initially seen on CT in 2017, without any significant findings on MRI August 2019 for CTA October 2019 or recent MRI this month.    Office visit in 3 months after CT liver obtained.  Follow Up Instructions:    I discussed the assessment and treatment plan with the patient. The patient was provided an opportunity to ask questions and all were answered. The patient agreed with the plan and demonstrated an understanding of the instructions. AVS mailed to patient's home address.   The patient was advised to call back or seek an in-person evaluation if the symptoms worsen or if the condition fails to improve as anticipated.  I provided 25 minutes of virtual face-to-face time during this encounter.   Neil Crouch, PA-C

## 2018-10-29 DIAGNOSIS — I69354 Hemiplegia and hemiparesis following cerebral infarction affecting left non-dominant side: Secondary | ICD-10-CM | POA: Diagnosis not present

## 2018-10-29 DIAGNOSIS — G894 Chronic pain syndrome: Secondary | ICD-10-CM | POA: Diagnosis not present

## 2018-10-29 DIAGNOSIS — Z9181 History of falling: Secondary | ICD-10-CM | POA: Diagnosis not present

## 2018-10-29 DIAGNOSIS — I69398 Other sequelae of cerebral infarction: Secondary | ICD-10-CM | POA: Diagnosis not present

## 2018-10-29 DIAGNOSIS — I10 Essential (primary) hypertension: Secondary | ICD-10-CM | POA: Diagnosis not present

## 2018-10-29 DIAGNOSIS — Z7902 Long term (current) use of antithrombotics/antiplatelets: Secondary | ICD-10-CM | POA: Diagnosis not present

## 2018-10-29 DIAGNOSIS — I69351 Hemiplegia and hemiparesis following cerebral infarction affecting right dominant side: Secondary | ICD-10-CM | POA: Diagnosis not present

## 2018-10-29 DIAGNOSIS — I951 Orthostatic hypotension: Secondary | ICD-10-CM | POA: Diagnosis not present

## 2018-10-29 DIAGNOSIS — K7460 Unspecified cirrhosis of liver: Secondary | ICD-10-CM | POA: Diagnosis not present

## 2018-11-01 NOTE — Progress Notes (Signed)
cc'd to pcp 

## 2018-11-03 ENCOUNTER — Telehealth: Payer: Self-pay | Admitting: Cardiology

## 2018-11-03 NOTE — Telephone Encounter (Signed)
Previous phone note sent 4/28

## 2018-11-03 NOTE — Telephone Encounter (Signed)
Called patient to let him know that [facility] will be calling him soon to set up referral from Dr. Harl Bowie. They have not been seeing patients in the office, Spoke with patient's wife she states that his BP continues to stay high  (last reading was 138/103 @930AM  5/6) patient is very weak having a hard time to even get out of the bed.

## 2018-11-04 NOTE — Telephone Encounter (Signed)
Spoke with wife and scheduled telehealth appt with [name] 5/14 - pt wife verbalized consent and that insurance would be billed for the encounter.

## 2018-11-04 NOTE — Telephone Encounter (Signed)
I reviewed Dr. Nelly Laurence office note as well as telephone notes and notes by Dr. Jannifer Franklin.  His blood pressures appear to fluctuate quite a bit and Dr. Jannifer Franklin has been managing medications including midodrine. I would offer the patient a telehealth visit with Dr. Harl Bowie or APP next week.

## 2018-11-05 DIAGNOSIS — I69354 Hemiplegia and hemiparesis following cerebral infarction affecting left non-dominant side: Secondary | ICD-10-CM | POA: Diagnosis not present

## 2018-11-05 DIAGNOSIS — Z7902 Long term (current) use of antithrombotics/antiplatelets: Secondary | ICD-10-CM | POA: Diagnosis not present

## 2018-11-05 DIAGNOSIS — I10 Essential (primary) hypertension: Secondary | ICD-10-CM | POA: Diagnosis not present

## 2018-11-05 DIAGNOSIS — I69351 Hemiplegia and hemiparesis following cerebral infarction affecting right dominant side: Secondary | ICD-10-CM | POA: Diagnosis not present

## 2018-11-05 DIAGNOSIS — K7460 Unspecified cirrhosis of liver: Secondary | ICD-10-CM | POA: Diagnosis not present

## 2018-11-05 DIAGNOSIS — I69398 Other sequelae of cerebral infarction: Secondary | ICD-10-CM | POA: Diagnosis not present

## 2018-11-05 DIAGNOSIS — Z9181 History of falling: Secondary | ICD-10-CM | POA: Diagnosis not present

## 2018-11-05 DIAGNOSIS — G894 Chronic pain syndrome: Secondary | ICD-10-CM | POA: Diagnosis not present

## 2018-11-05 DIAGNOSIS — I951 Orthostatic hypotension: Secondary | ICD-10-CM | POA: Diagnosis not present

## 2018-11-10 DIAGNOSIS — I69351 Hemiplegia and hemiparesis following cerebral infarction affecting right dominant side: Secondary | ICD-10-CM | POA: Diagnosis not present

## 2018-11-10 DIAGNOSIS — G894 Chronic pain syndrome: Secondary | ICD-10-CM | POA: Diagnosis not present

## 2018-11-10 DIAGNOSIS — I951 Orthostatic hypotension: Secondary | ICD-10-CM | POA: Diagnosis not present

## 2018-11-10 DIAGNOSIS — I10 Essential (primary) hypertension: Secondary | ICD-10-CM | POA: Diagnosis not present

## 2018-11-10 DIAGNOSIS — Z7902 Long term (current) use of antithrombotics/antiplatelets: Secondary | ICD-10-CM | POA: Diagnosis not present

## 2018-11-10 DIAGNOSIS — Z9181 History of falling: Secondary | ICD-10-CM | POA: Diagnosis not present

## 2018-11-10 DIAGNOSIS — I69354 Hemiplegia and hemiparesis following cerebral infarction affecting left non-dominant side: Secondary | ICD-10-CM | POA: Diagnosis not present

## 2018-11-10 DIAGNOSIS — K7460 Unspecified cirrhosis of liver: Secondary | ICD-10-CM | POA: Diagnosis not present

## 2018-11-10 DIAGNOSIS — I69398 Other sequelae of cerebral infarction: Secondary | ICD-10-CM | POA: Diagnosis not present

## 2018-11-11 ENCOUNTER — Encounter: Payer: Self-pay | Admitting: Cardiology

## 2018-11-11 ENCOUNTER — Telehealth (INDEPENDENT_AMBULATORY_CARE_PROVIDER_SITE_OTHER): Payer: Medicare Other | Admitting: Cardiology

## 2018-11-11 VITALS — Ht 67.0 in | Wt 117.0 lb

## 2018-11-11 DIAGNOSIS — J449 Chronic obstructive pulmonary disease, unspecified: Secondary | ICD-10-CM | POA: Diagnosis not present

## 2018-11-11 MED ORDER — FLUDROCORTISONE ACETATE 0.1 MG PO TABS: 0.1000 mg | ORAL_TABLET | Freq: Every day | ORAL | 1 refills | Status: DC

## 2018-11-11 MED ORDER — LISINOPRIL 20 MG PO TABS: 20.0000 mg | ORAL_TABLET | Freq: Every day | ORAL | 1 refills | Status: DC

## 2018-11-11 NOTE — Patient Instructions (Addendum)
Your physician recommends that you schedule a follow-up appointment in: [place] has recommended you make the following change in your medication:   STOP MIDODRINE   START FLORINEF 0.1 MG DAILY   DECREASE LISINOPRIL 20 MG DAILY   You have been referred to PULMONARY   PLEASE UPDATE [place] ON YOUR BLOOD PRESSURE AND SYMPTOMS NEXT WEEK   Thank you for choosing [facility]!!

## 2018-11-11 NOTE — Progress Notes (Signed)
Virtual Visit via Video Note   This visit type was conducted due to national recommendations for restrictions regarding the COVID-19 Pandemic (e.g. social distancing) in an effort to limit this patient's exposure and mitigate transmission in our community.  Due to his co-morbid illnesses, this patient is at least at moderate risk for complications without adequate follow up.  This format is felt to be most appropriate for this patient at this time.  All issues noted in this document were discussed and addressed.  A limited physical exam was performed with this format.  Please refer to the patient's chart for his consent to telehealth for [facility].   Date:  11/11/2018   ID:  Eric Tran, DOB [birth year], MRN [id number]  Patient Location: Home Provider Location: Home  PCP:  Rosalee Kaufman, PA-C  Cardiologist:  [name], MD  Electrophysiologist:  None   Evaluation Performed:  Follow-Up Visit  Chief Complaint:  6 month follow up  History of Present Illness:    Eric Tran is a 68 y.o. male seen today for follow up of the following medical problems.   1. SOB -11/2016 stress test without ischemia - echo 10/2016 LVEF 65-70%, no WMAs, mild gradient, no provocative maneuvers performed. Previous gradient of 78mmHg with Valsalva - previous PFTs. Mild obstruction, air trapping, high airway resistance. Moderate emphysema on CT  - his chronic SOB/DOE is stable since last visit    2. AAA - 3.3 cm noted on previous imagaing. - Jan 2019 3.2 cmaneurysm   3. Thoracic aneurysm - CT 11/2016 3.9 cm ascedning aneurysm stable, 3.9 cm descending aneurysm. Denies any recent symptoms - followed by Dr Cyndia Bent CT surgery.   4. COPD - previously followed by Dr [name] - 09/2018 asked for referal to pulmonary in [facility]     5. Hx of CVA - patient with history of multiple CVAs in the past. According to neurology notes has had a pontine stroke, left cerebellar  stroke, bilateral thalamic strokes, a left caudate stroke, right posterior limb of internal capsule stroke, right frontal stroke.  . - based on high concern for possible cardioembolic etiology, patient was originally referred for outpatient cardiac monitoring for possible afib/aflutter. He preferred an implanted loop recorder as opposed to external monitor, this was placed by Dr Rayann Heman   - loop recorder removed 01/2018    6. Left ventricular hypertrophy - dynamic gradient noted on echo, symmetric moderate hypertrophy and hyperdynamic LV is described.    - propanolol discontinued after recent admission with near syncope and significant pause on loop recorder.  - chronic SOB unchanged. No significant chest pain, no syncope.   - echo 2016 wall thickness 14mm, hyperdynamic LV. Provoked dynamic gradient of 68mmHg with chordal SAM  - stable SOB. Lightheadedness at times but no syncope  7. OSA - sleep study at [facility] - does not use CPAP machine due to discomfort, has not had refitting. Followed [facility] the [facility]  8. HTN - he is compliant with meds   9. Weight loss and anorexia/Liver lesion - followed by GI  10 . Chronic pain syndrome - followed by pain clinic in [facility]   11. Low bp/HTN - had been started on midodrine by neuro 10/05/18 due to dizziness with standing. Initially started on 2.5mg  bid. Had high bp's and lowered to 2.5mg  daily - he stopped taking midodrine.   - bp's are high with laying down. 160/112 HR 87. Standing bp's 130/88  Sitting 117/88 - significant dizziness on his  feet. Has had some falls - some low bp's at times 90/60s while laying down.  - last dose April 20th  - family reports good appetite - no coffee, no tea, no water, . Only drinks mountain dew 16 oz bottles x 6 per day. - he is orthostatic by vitals reported by wife.     SH Wife is a Equities trader    The patient does not have symptoms concerning for COVID-19 infection (fever,  chills, cough, or new shortness of breath).    Past Medical History:  Diagnosis Date  . Abdominal aortic aneurysm ([place])   . Abnormality of gait 10/03/2014  . Anxiety   . Arthritis   . BPH (benign prostatic hyperplasia)   . Carpal tunnel syndrome   . Cerebrovascular disease 10/03/2014  . Chronic back pain    chronic narcotic use  . Chronic hepatitis C ([place]) 1980   [place] 2016, cured. HCV RNA negative 01/2016  . Chronic pain   . Degenerative disc disease   . Depression   . Head trauma   . Hepatic cirrhosis ([place]) 02/19/2016  . Hypertension   . Hyperthyroidism   . Memory difficulties 10/03/2014  . Partial complex seizure disorder without intractable epilepsy ([place]) 10/03/2014  . REM behavioral disorder 09/22/2018  . Seizures ([place])    Epilepsy  . Stroke [facility]) 2012   left hemiplegia  . TIA (transient ischemic attack)   . Tremor   . Tremor, essential 10/02/2016  . UTI (lower urinary tract infection)    Past Surgical History:  Procedure Laterality Date  . EP IMPLANTABLE DEVICE N/A 11/15/2014   Procedure: Loop Recorder Insertion;  Surgeon: Thompson Grayer, MD;  Location: [place] CV LAB;  Service: Cardiovascular;  Laterality: N/A;  . ESOPHAGOGASTRODUODENOSCOPY (EGD) WITH PROPOFOL N/A 03/20/2016   Procedure: ESOPHAGOGASTRODUODENOSCOPY (EGD) WITH PROPOFOL;  Surgeon: Daneil Dolin, MD;  [facility];  Service: Endoscopy;  Laterality: N/A;  . ESOPHAGOGASTRODUODENOSCOPY (EGD) WITH PROPOFOL N/A 03/22/2018   Dr. Emerson Monte: Portal hypertensive gastropathy, next EGD in 2 years.  [facility] HAND SURGERY     right  . implantable loop recorder removal  02/17/2018   MDT LINQ removed by Dr Rayann Heman for end of servce  . NECK SURGERY    . right hand reconstruction       No outpatient medications have been marked as taking for the 11/11/18 encounter (Appointment) with Arnoldo Lenis, MD.     Allergies:   Keppra [levetiracetam]; Adhesive [tape]; Methadone; Morphine and related; and Tramadol   Social  History   Tobacco Use  . Smoking status: Current Every Day Smoker    Packs/day: 1.00    Years: 45.00    Pack years: 45.00    Types: Cigarettes    Start date: 07/18/1962  . Smokeless tobacco: Never Used  Substance Use Topics  . Alcohol use: No    Alcohol/week: 0.0 standard drinks    Comment: occasionally  . Drug use: No    Types: Marijuana    Comment: prior cocaine/ marijuana     Family Hx: The patient's family history includes Breast cancer in his sister; Cervical cancer in his mother; Heart failure in his father; Lung cancer in his sister. There is no history of Colon cancer or Colon polyps.  ROS:   Please see the history of present illness.     All other systems reviewed and are negative.   Prior CV studies:   The following studies were reviewed today:  10/2014 echo Study Conclusions  -  Left ventricle: The cavity size was mildly reduced. Wall thickness was increased in a pattern of moderate LVH. Systolic function was vigorous. The estimated ejection fraction was in the range of 65% to 70%. There is mitral chordal SAM. Resting LVOT gradient 27 mmHg with increase to 77 mmHg with Valsalva consistent with hypertrophic obstructive cardiomyopathy. Wall motion was normal; there were no regional wall motion abnormalities. Doppler parameters are consistent with abnormal left ventricular relaxation (grade 1 diastolic dysfunction). - Aortic valve: Mildly calcified annulus. Trileaflet; mildly calcified leaflets. Peak gradient (S): 27 mm Hg at rest reflecting LVOT flow. - Aortic root: The aortic root was mildly ectatic. - Mitral valve: Calcified annulus. There was trivial regurgitation. - Left atrium: The atrium was at the upper limits of normal in size. - Right atrium: Central venous pressure (est): 3 mm Hg. - Atrial septum: No defect or patent foramen ovale was identified. - Tricuspid valve: There was trivial regurgitation. - Pulmonary arteries:  Systolic pressure could not be accurately estimated. - Pericardium, extracardiac: There was no pericardial effusion.  2017 PFTs Mild obstruction, air trapping, high airway resistance.  11/2016 nuclear stress test  There was no ST segment deviation noted during stress.  The study is normal. No ischemia or scar.  This is a low risk study.  Nuclear stress EF: 54%.  Labs/Other Tests and Data Reviewed:    EKG:  No ECG reviewed.  Recent Labs: 09/22/2018: ALT 9; BUN 12; Creatinine, Ser 0.72; Hemoglobin 14.3; Platelets 201; Potassium 4.5; Sodium 141   Recent Lipid Panel Lab Results  Component Value Date/Time   CHOL 173 11/22/2014 12:42 PM   TRIG 169 (H) 11/22/2014 12:42 PM   HDL 50 11/22/2014 12:42 PM   LDLCALC 89 11/22/2014 12:42 PM    Wt Readings from Last 3 Encounters:  09/22/18 117 lb 5 oz (53.2 kg)  07/13/18 111 lb 3.2 oz (50.4 kg)  04/28/18 121 lb (54.9 kg)     Objective:    Vital Signs:   Today's Vitals   11/11/18 1505  Weight: 117 lb (53.1 kg)  Height: 5\' 7"  (1.702 m)   Body mass index is 18.32 kg/m.  Today's Vitals   11/11/18 1505  Weight: 117 lb (53.1 kg)  Height: 5\' 7"  (1.702 m)   Body mass index is 18.32 kg/m.  ASSESSMENT & PLAN:    1.SOB - fairly benign cardiac workup previously. Stress test without ischemia. Echo with normal LVEF. He does have diastolic dysfunction as well as an intracavitary dynamic gradient. Treatment for this is limited due to severe bradycardia on beta blockers in the past  Symptoms stable, no changes today    2. Left ventricular hypertrophy - dynamic gradient noted on echo due to symmetric LVH and hyperdynamic LV - off beta blocker due to recent pauses noted on loop recorder and presyncope  - encouraged increased hydration, continue to avoid beta blockers   3. Orthostatic hypotension - wife is a Equities trader who, her orthostatics today show SBP drop 30 points with standing - very poor oral hydration.  Only drinks mountain dew. - very high bp's with trial of midodrone. We will try florinef 0.1mg  daiily, encouraged better hydration. Lower lisinopril to 20mg  daily. Management complicated by supine HTN and significant orthostatic hypotension     COVID-19 Education: The signs and symptoms of COVID-19 were discussed with the patient and how to seek care for testing (follow up with PCP or arrange E-visit).  The importance of social distancing was discussed today.  Time:  Today, I have spent 13 minutes with the patient with telehealth technology discussing the above problems.     Medication Adjustments/Labs and Tests Ordered: Current medicines are reviewed at length with the patient today.  Concerns regarding medicines are outlined above.   Tests Ordered: No orders of the defined types were placed in this encounter.   Medication Changes: No orders of the defined types were placed in this encounter.   Disposition:  Follow up 2 months  Signed, [name], MD  11/11/2018 2:22 PM    [place]

## 2018-11-12 DIAGNOSIS — Z9181 History of falling: Secondary | ICD-10-CM | POA: Diagnosis not present

## 2018-11-12 DIAGNOSIS — K7460 Unspecified cirrhosis of liver: Secondary | ICD-10-CM | POA: Diagnosis not present

## 2018-11-12 DIAGNOSIS — I69398 Other sequelae of cerebral infarction: Secondary | ICD-10-CM | POA: Diagnosis not present

## 2018-11-12 DIAGNOSIS — Z7902 Long term (current) use of antithrombotics/antiplatelets: Secondary | ICD-10-CM | POA: Diagnosis not present

## 2018-11-12 DIAGNOSIS — I951 Orthostatic hypotension: Secondary | ICD-10-CM | POA: Diagnosis not present

## 2018-11-12 DIAGNOSIS — I10 Essential (primary) hypertension: Secondary | ICD-10-CM | POA: Diagnosis not present

## 2018-11-12 DIAGNOSIS — I69351 Hemiplegia and hemiparesis following cerebral infarction affecting right dominant side: Secondary | ICD-10-CM | POA: Diagnosis not present

## 2018-11-12 DIAGNOSIS — I69354 Hemiplegia and hemiparesis following cerebral infarction affecting left non-dominant side: Secondary | ICD-10-CM | POA: Diagnosis not present

## 2018-11-12 DIAGNOSIS — G894 Chronic pain syndrome: Secondary | ICD-10-CM | POA: Diagnosis not present

## 2018-11-16 DIAGNOSIS — I10 Essential (primary) hypertension: Secondary | ICD-10-CM | POA: Diagnosis not present

## 2018-11-16 DIAGNOSIS — I69354 Hemiplegia and hemiparesis following cerebral infarction affecting left non-dominant side: Secondary | ICD-10-CM | POA: Diagnosis not present

## 2018-11-16 DIAGNOSIS — I951 Orthostatic hypotension: Secondary | ICD-10-CM | POA: Diagnosis not present

## 2018-11-16 DIAGNOSIS — K7460 Unspecified cirrhosis of liver: Secondary | ICD-10-CM | POA: Diagnosis not present

## 2018-11-16 DIAGNOSIS — I69351 Hemiplegia and hemiparesis following cerebral infarction affecting right dominant side: Secondary | ICD-10-CM | POA: Diagnosis not present

## 2018-11-16 DIAGNOSIS — Z7902 Long term (current) use of antithrombotics/antiplatelets: Secondary | ICD-10-CM | POA: Diagnosis not present

## 2018-11-16 DIAGNOSIS — I69398 Other sequelae of cerebral infarction: Secondary | ICD-10-CM | POA: Diagnosis not present

## 2018-11-16 DIAGNOSIS — Z9181 History of falling: Secondary | ICD-10-CM | POA: Diagnosis not present

## 2018-11-16 DIAGNOSIS — G894 Chronic pain syndrome: Secondary | ICD-10-CM | POA: Diagnosis not present

## 2018-11-17 ENCOUNTER — Ambulatory Visit: Payer: Medicare Other | Admitting: Cardiology

## 2018-11-18 DIAGNOSIS — G894 Chronic pain syndrome: Secondary | ICD-10-CM | POA: Diagnosis not present

## 2018-11-18 DIAGNOSIS — I69351 Hemiplegia and hemiparesis following cerebral infarction affecting right dominant side: Secondary | ICD-10-CM | POA: Diagnosis not present

## 2018-11-18 DIAGNOSIS — I69398 Other sequelae of cerebral infarction: Secondary | ICD-10-CM | POA: Diagnosis not present

## 2018-11-18 DIAGNOSIS — I10 Essential (primary) hypertension: Secondary | ICD-10-CM | POA: Diagnosis not present

## 2018-11-18 DIAGNOSIS — K7460 Unspecified cirrhosis of liver: Secondary | ICD-10-CM | POA: Diagnosis not present

## 2018-11-18 DIAGNOSIS — I951 Orthostatic hypotension: Secondary | ICD-10-CM | POA: Diagnosis not present

## 2018-11-18 DIAGNOSIS — Z9181 History of falling: Secondary | ICD-10-CM | POA: Diagnosis not present

## 2018-11-18 DIAGNOSIS — I69354 Hemiplegia and hemiparesis following cerebral infarction affecting left non-dominant side: Secondary | ICD-10-CM | POA: Diagnosis not present

## 2018-11-18 DIAGNOSIS — Z7902 Long term (current) use of antithrombotics/antiplatelets: Secondary | ICD-10-CM | POA: Diagnosis not present

## 2018-11-24 DIAGNOSIS — I69398 Other sequelae of cerebral infarction: Secondary | ICD-10-CM | POA: Diagnosis not present

## 2018-11-24 DIAGNOSIS — Z9181 History of falling: Secondary | ICD-10-CM | POA: Diagnosis not present

## 2018-11-24 DIAGNOSIS — I10 Essential (primary) hypertension: Secondary | ICD-10-CM | POA: Diagnosis not present

## 2018-11-24 DIAGNOSIS — K7460 Unspecified cirrhosis of liver: Secondary | ICD-10-CM | POA: Diagnosis not present

## 2018-11-24 DIAGNOSIS — I69354 Hemiplegia and hemiparesis following cerebral infarction affecting left non-dominant side: Secondary | ICD-10-CM | POA: Diagnosis not present

## 2018-11-24 DIAGNOSIS — G894 Chronic pain syndrome: Secondary | ICD-10-CM | POA: Diagnosis not present

## 2018-11-24 DIAGNOSIS — I951 Orthostatic hypotension: Secondary | ICD-10-CM | POA: Diagnosis not present

## 2018-11-24 DIAGNOSIS — Z7902 Long term (current) use of antithrombotics/antiplatelets: Secondary | ICD-10-CM | POA: Diagnosis not present

## 2018-11-24 DIAGNOSIS — I69351 Hemiplegia and hemiparesis following cerebral infarction affecting right dominant side: Secondary | ICD-10-CM | POA: Diagnosis not present

## 2018-11-25 DIAGNOSIS — G890 Central pain syndrome: Secondary | ICD-10-CM | POA: Diagnosis not present

## 2018-11-25 DIAGNOSIS — K769 Liver disease, unspecified: Secondary | ICD-10-CM | POA: Diagnosis not present

## 2018-11-25 DIAGNOSIS — K7460 Unspecified cirrhosis of liver: Secondary | ICD-10-CM | POA: Diagnosis not present

## 2018-11-25 DIAGNOSIS — G894 Chronic pain syndrome: Secondary | ICD-10-CM | POA: Diagnosis not present

## 2018-11-25 DIAGNOSIS — M545 Low back pain: Secondary | ICD-10-CM | POA: Diagnosis not present

## 2018-11-25 DIAGNOSIS — I6930 Unspecified sequelae of cerebral infarction: Secondary | ICD-10-CM | POA: Diagnosis not present

## 2018-11-26 LAB — AFP TUMOR MARKER: AFP, Serum, Tumor Marker: 2.3 ng/mL (ref 0.0–8.3)

## 2018-12-07 DIAGNOSIS — R410 Disorientation, unspecified: Secondary | ICD-10-CM | POA: Diagnosis not present

## 2018-12-09 DIAGNOSIS — I69898 Other sequelae of other cerebrovascular disease: Secondary | ICD-10-CM | POA: Diagnosis not present

## 2018-12-09 DIAGNOSIS — I69318 Other symptoms and signs involving cognitive functions following cerebral infarction: Secondary | ICD-10-CM | POA: Diagnosis not present

## 2018-12-09 DIAGNOSIS — I69354 Hemiplegia and hemiparesis following cerebral infarction affecting left non-dominant side: Secondary | ICD-10-CM | POA: Diagnosis not present

## 2018-12-09 DIAGNOSIS — Z87820 Personal history of traumatic brain injury: Secondary | ICD-10-CM | POA: Diagnosis not present

## 2018-12-09 DIAGNOSIS — M545 Low back pain: Secondary | ICD-10-CM | POA: Diagnosis not present

## 2018-12-09 DIAGNOSIS — M47816 Spondylosis without myelopathy or radiculopathy, lumbar region: Secondary | ICD-10-CM | POA: Diagnosis not present

## 2018-12-10 DIAGNOSIS — I69318 Other symptoms and signs involving cognitive functions following cerebral infarction: Secondary | ICD-10-CM | POA: Diagnosis not present

## 2018-12-10 DIAGNOSIS — I69898 Other sequelae of other cerebrovascular disease: Secondary | ICD-10-CM | POA: Diagnosis not present

## 2018-12-10 DIAGNOSIS — I69354 Hemiplegia and hemiparesis following cerebral infarction affecting left non-dominant side: Secondary | ICD-10-CM | POA: Diagnosis not present

## 2018-12-10 DIAGNOSIS — Z87820 Personal history of traumatic brain injury: Secondary | ICD-10-CM | POA: Diagnosis not present

## 2018-12-14 DIAGNOSIS — I69898 Other sequelae of other cerebrovascular disease: Secondary | ICD-10-CM | POA: Diagnosis not present

## 2018-12-14 DIAGNOSIS — I69354 Hemiplegia and hemiparesis following cerebral infarction affecting left non-dominant side: Secondary | ICD-10-CM | POA: Diagnosis not present

## 2018-12-14 DIAGNOSIS — I69318 Other symptoms and signs involving cognitive functions following cerebral infarction: Secondary | ICD-10-CM | POA: Diagnosis not present

## 2018-12-14 DIAGNOSIS — Z87820 Personal history of traumatic brain injury: Secondary | ICD-10-CM | POA: Diagnosis not present

## 2018-12-15 DIAGNOSIS — I69318 Other symptoms and signs involving cognitive functions following cerebral infarction: Secondary | ICD-10-CM | POA: Diagnosis not present

## 2018-12-15 DIAGNOSIS — I69354 Hemiplegia and hemiparesis following cerebral infarction affecting left non-dominant side: Secondary | ICD-10-CM | POA: Diagnosis not present

## 2018-12-15 DIAGNOSIS — I69898 Other sequelae of other cerebrovascular disease: Secondary | ICD-10-CM | POA: Diagnosis not present

## 2018-12-15 DIAGNOSIS — Z87820 Personal history of traumatic brain injury: Secondary | ICD-10-CM | POA: Diagnosis not present

## 2018-12-16 DIAGNOSIS — Z87820 Personal history of traumatic brain injury: Secondary | ICD-10-CM | POA: Diagnosis not present

## 2018-12-16 DIAGNOSIS — I69898 Other sequelae of other cerebrovascular disease: Secondary | ICD-10-CM | POA: Diagnosis not present

## 2018-12-16 DIAGNOSIS — I69354 Hemiplegia and hemiparesis following cerebral infarction affecting left non-dominant side: Secondary | ICD-10-CM | POA: Diagnosis not present

## 2018-12-16 DIAGNOSIS — I69318 Other symptoms and signs involving cognitive functions following cerebral infarction: Secondary | ICD-10-CM | POA: Diagnosis not present

## 2018-12-17 DIAGNOSIS — I69318 Other symptoms and signs involving cognitive functions following cerebral infarction: Secondary | ICD-10-CM | POA: Diagnosis not present

## 2018-12-17 DIAGNOSIS — I69898 Other sequelae of other cerebrovascular disease: Secondary | ICD-10-CM | POA: Diagnosis not present

## 2018-12-17 DIAGNOSIS — I69354 Hemiplegia and hemiparesis following cerebral infarction affecting left non-dominant side: Secondary | ICD-10-CM | POA: Diagnosis not present

## 2018-12-17 DIAGNOSIS — Z87820 Personal history of traumatic brain injury: Secondary | ICD-10-CM | POA: Diagnosis not present

## 2018-12-21 DIAGNOSIS — I69898 Other sequelae of other cerebrovascular disease: Secondary | ICD-10-CM | POA: Diagnosis not present

## 2018-12-21 DIAGNOSIS — I69354 Hemiplegia and hemiparesis following cerebral infarction affecting left non-dominant side: Secondary | ICD-10-CM | POA: Diagnosis not present

## 2018-12-21 DIAGNOSIS — I69318 Other symptoms and signs involving cognitive functions following cerebral infarction: Secondary | ICD-10-CM | POA: Diagnosis not present

## 2018-12-21 DIAGNOSIS — Z87820 Personal history of traumatic brain injury: Secondary | ICD-10-CM | POA: Diagnosis not present

## 2018-12-22 DIAGNOSIS — I69318 Other symptoms and signs involving cognitive functions following cerebral infarction: Secondary | ICD-10-CM | POA: Diagnosis not present

## 2018-12-22 DIAGNOSIS — I69898 Other sequelae of other cerebrovascular disease: Secondary | ICD-10-CM | POA: Diagnosis not present

## 2018-12-22 DIAGNOSIS — I69354 Hemiplegia and hemiparesis following cerebral infarction affecting left non-dominant side: Secondary | ICD-10-CM | POA: Diagnosis not present

## 2018-12-22 DIAGNOSIS — Z87820 Personal history of traumatic brain injury: Secondary | ICD-10-CM | POA: Diagnosis not present

## 2018-12-23 DIAGNOSIS — I69318 Other symptoms and signs involving cognitive functions following cerebral infarction: Secondary | ICD-10-CM | POA: Diagnosis not present

## 2018-12-23 DIAGNOSIS — Z79899 Other long term (current) drug therapy: Secondary | ICD-10-CM | POA: Diagnosis not present

## 2018-12-23 DIAGNOSIS — M545 Low back pain: Secondary | ICD-10-CM | POA: Diagnosis not present

## 2018-12-23 DIAGNOSIS — I69898 Other sequelae of other cerebrovascular disease: Secondary | ICD-10-CM | POA: Diagnosis not present

## 2018-12-23 DIAGNOSIS — G894 Chronic pain syndrome: Secondary | ICD-10-CM | POA: Diagnosis not present

## 2018-12-23 DIAGNOSIS — Z87820 Personal history of traumatic brain injury: Secondary | ICD-10-CM | POA: Diagnosis not present

## 2018-12-23 DIAGNOSIS — I6930 Unspecified sequelae of cerebral infarction: Secondary | ICD-10-CM | POA: Diagnosis not present

## 2018-12-23 DIAGNOSIS — I69354 Hemiplegia and hemiparesis following cerebral infarction affecting left non-dominant side: Secondary | ICD-10-CM | POA: Diagnosis not present

## 2018-12-23 DIAGNOSIS — Z79891 Long term (current) use of opiate analgesic: Secondary | ICD-10-CM | POA: Diagnosis not present

## 2018-12-23 DIAGNOSIS — G890 Central pain syndrome: Secondary | ICD-10-CM | POA: Diagnosis not present

## 2018-12-28 DIAGNOSIS — I69898 Other sequelae of other cerebrovascular disease: Secondary | ICD-10-CM | POA: Diagnosis not present

## 2018-12-28 DIAGNOSIS — I69354 Hemiplegia and hemiparesis following cerebral infarction affecting left non-dominant side: Secondary | ICD-10-CM | POA: Diagnosis not present

## 2018-12-28 DIAGNOSIS — I69318 Other symptoms and signs involving cognitive functions following cerebral infarction: Secondary | ICD-10-CM | POA: Diagnosis not present

## 2018-12-28 DIAGNOSIS — Z87820 Personal history of traumatic brain injury: Secondary | ICD-10-CM | POA: Diagnosis not present

## 2019-01-03 DIAGNOSIS — I69318 Other symptoms and signs involving cognitive functions following cerebral infarction: Secondary | ICD-10-CM | POA: Diagnosis not present

## 2019-01-03 DIAGNOSIS — G894 Chronic pain syndrome: Secondary | ICD-10-CM | POA: Diagnosis not present

## 2019-01-03 DIAGNOSIS — Z8673 Personal history of transient ischemic attack (TIA), and cerebral infarction without residual deficits: Secondary | ICD-10-CM | POA: Diagnosis not present

## 2019-01-03 DIAGNOSIS — I69354 Hemiplegia and hemiparesis following cerebral infarction affecting left non-dominant side: Secondary | ICD-10-CM | POA: Diagnosis not present

## 2019-01-10 ENCOUNTER — Telehealth: Payer: Self-pay | Admitting: Cardiology

## 2019-01-10 NOTE — Telephone Encounter (Signed)
Daughter informed

## 2019-01-10 NOTE — Telephone Encounter (Signed)
Can cancel appt, they can call us as needed   [name] MD

## 2019-01-10 NOTE — Telephone Encounter (Signed)
Husband has appt with [name] 7/15 at 3:40 - she wanted to know if appointment was needed. He has been placed in hospice care due to his weight loss.

## 2019-01-12 ENCOUNTER — Ambulatory Visit: Payer: Medicare Other | Admitting: Cardiology

## 2019-02-02 ENCOUNTER — Other Ambulatory Visit: Payer: Self-pay | Admitting: Neurology

## 2019-02-02 ENCOUNTER — Telehealth: Payer: Self-pay | Admitting: Internal Medicine

## 2019-02-02 NOTE — Telephone Encounter (Signed)
RECALL FOR CT LIVER

## 2019-02-02 NOTE — Telephone Encounter (Signed)
Recall sent 

## 2019-02-03 ENCOUNTER — Other Ambulatory Visit: Payer: Self-pay | Admitting: Neurology

## 2019-02-03 DIAGNOSIS — G894 Chronic pain syndrome: Secondary | ICD-10-CM | POA: Diagnosis not present

## 2019-02-03 DIAGNOSIS — Z8673 Personal history of transient ischemic attack (TIA), and cerebral infarction without residual deficits: Secondary | ICD-10-CM | POA: Diagnosis not present

## 2019-03-01 ENCOUNTER — Institutional Professional Consult (permissible substitution): Payer: Medicare Other | Admitting: Emergency Medicine

## 2019-03-03 ENCOUNTER — Telehealth: Payer: Self-pay

## 2019-03-03 DIAGNOSIS — K769 Liver disease, unspecified: Secondary | ICD-10-CM

## 2019-03-03 NOTE — Telephone Encounter (Signed)
Spouse called to schedule pt's CT scan. She received the letter stating it was time to schedule pts procedure. [phone]

## 2019-03-06 DIAGNOSIS — G894 Chronic pain syndrome: Secondary | ICD-10-CM | POA: Diagnosis not present

## 2019-03-06 DIAGNOSIS — Z8673 Personal history of transient ischemic attack (TIA), and cerebral infarction without residual deficits: Secondary | ICD-10-CM | POA: Diagnosis not present

## 2019-03-08 NOTE — Telephone Encounter (Signed)
CT abdomen w/wo contrast scheduled for 03/22/19 at 11:00am, arrive at 10:45am. NPO 4 hours prior to CT. Have bun drawn prior to CT.  Called and informed wife of appt. She wants to take pt to [facility] lab for blood work same day as CT so she doesn't have to get him out of car. Lab order faxed to [facility] lab. Appt letter mailed.

## 2019-03-08 NOTE — Telephone Encounter (Signed)
No PA needed per [facility] website for CT. No PA needed per [employer] website: NOTE: This [facility] member does not require prior authorization for OUTPATIENT Radiology through [place] or [facility] at this time.

## 2019-03-08 NOTE — Addendum Note (Signed)
Addended by: Hassan Rowan on: 03/08/2019 01:36 PM   Modules accepted: Orders

## 2019-03-16 ENCOUNTER — Institutional Professional Consult (permissible substitution): Payer: Medicare Other | Admitting: Emergency Medicine

## 2019-03-22 ENCOUNTER — Encounter: Payer: Self-pay | Admitting: Internal Medicine

## 2019-03-22 ENCOUNTER — Other Ambulatory Visit (HOSPITAL_COMMUNITY)
Admission: RE | Admit: 2019-03-22 | Discharge: 2019-03-22 | Disposition: A | Discharge status: Home / Self Care | Payer: Medicare Other | Source: Ambulatory Visit | Attending: Gastroenterology | Admitting: Gastroenterology

## 2019-03-22 ENCOUNTER — Ambulatory Visit (HOSPITAL_COMMUNITY)
Admission: RE | Admit: 2019-03-22 | Discharge: 2019-03-22 | Disposition: A | Discharge status: Home / Self Care | Payer: Medicare Other | Source: Ambulatory Visit | Attending: Gastroenterology | Admitting: Gastroenterology

## 2019-03-22 ENCOUNTER — Ambulatory Visit (INDEPENDENT_AMBULATORY_CARE_PROVIDER_SITE_OTHER): Admitting: Internal Medicine

## 2019-03-22 ENCOUNTER — Institutional Professional Consult (permissible substitution): Payer: Medicare Other | Admitting: Internal Medicine

## 2019-03-22 ENCOUNTER — Other Ambulatory Visit: Payer: Self-pay

## 2019-03-22 DIAGNOSIS — J449 Chronic obstructive pulmonary disease, unspecified: Secondary | ICD-10-CM | POA: Diagnosis not present

## 2019-03-22 DIAGNOSIS — K769 Liver disease, unspecified: Secondary | ICD-10-CM | POA: Insufficient documentation

## 2019-03-22 LAB — CREATININE, SERUM
Creatinine, Ser: 0.53 mg/dL — ABNORMAL LOW (ref 0.61–1.24)
GFR calc Af Amer: >60 mL/min (ref >60)
GFR calc non Af Amer: >60 mL/min (ref >60)

## 2019-03-22 LAB — BUN: BUN: 8 mg/dL (ref 8–23)

## 2019-03-22 MED ORDER — IOHEXOL 300 MG/ML  SOLN
100.0000 mL | Freq: Once | INTRAMUSCULAR | Status: AC | PRN
  Administered 2019-03-22: 100 mL via INTRAVENOUS

## 2019-03-22 MED ORDER — ALBUTEROL SULFATE HFA 108 (90 BASE) MCG/ACT IN AERS: 2.0000 | INHALATION_SPRAY | RESPIRATORY_TRACT | 11 refills | Status: AC | PRN

## 2019-03-22 NOTE — Patient Instructions (Addendum)
Only use your albuterol as a rescue medication to be used if you can't catch your breath by resting or doing a relaxed purse lip breathing pattern.  - The less you use it, the better it will work when you need it. - Ok to use up to 2 puffs  every 4 hours if you must but call for immediate appointment if use goes up over your usual need - Don't leave home without it !!  (think of it like the spare tire for your car)    Pulmonary follow up is as needed  

## 2019-03-22 NOTE — Progress Notes (Signed)
Eric Tran, male    DOB: 01-Jan-1951      MRN: [id number]   Brief patient profile:  26 yowm active smoker prev eval by [name] with GOLD 0 Criteria 01/2016 on ACEi then  referred to pulmonary clinic 03/22/2019 by Dr  Harl Bowie for copd eval.   Has h/o Hep C cirrhosis, possible hepatic ca, multiple CVA,  hbp with diastolic dysfunction and osa but cpap intol. Chronic pain syndrome     History of Present Illness  03/22/2019  Pulmonary/ 1st office eval/[name] - Hospice pt Chief Complaint  Patient presents with  . Pulmonary Consult    Referred by Dr. [name] for eval of COPD. Pt states he used to see Dr [name] but he has retired. His breathing is currently stable. He mainly stays in bed all day. He is using his albuterol inhaler 2 x per wk on average.   Dyspnea:  Not limited as w/c bound due to poor strength in arms and legs and pain in shoulders Cough: none Sleep: hosp bed 30 degrees SABA use: twice a week due to "panic" always onset at rest, saba seems to help  No obvious day to day or daytime variability or assoc excess/ purulent sputum or mucus plugs or hemoptysis or cp or chest tightness, subjective wheeze or overt sinus or hb symptoms.   Sleeping as above without nocturnal  or early am exacerbation  of respiratory  c/o's or need for noct saba. Also denies any obvious fluctuation of symptoms with weather or environmental changes or other aggravating or alleviating factors except as outlined above   No unusual exposure hx or h/o childhood pna/ asthma or knowledge of premature birth.  Current Allergies, Complete Past Medical History, Past Surgical History, Family History, and Social History were reviewed in [employer] record.  ROS  The following are not active complaints unless bolded Hoarseness, sore throat, dysphagia, dental problems, itching, sneezing,  nasal congestion or discharge of excess mucus or purulent secretions, ear ache,   fever, chills, sweats,  unintended wt loss or wt gain, classically pleuritic or exertional cp,  orthopnea pnd or arm/hand swelling  or leg swelling, presyncope, palpitations, abdominal pain, anorexia, nausea, vomiting, diarrhea  or change in bowel habits or change in bladder habits, change in stools or change in urine, dysuria, hematuria,  rash, arthralgias, visual complaints, headache, numbness, weakness or ataxia or problems with walking or coordination,  change in mood or  memory.             Past Medical History:  Diagnosis Date  . Abdominal aortic aneurysm ([place])   . Abnormality of gait 10/03/2014  . Anxiety   . Arthritis   . BPH (benign prostatic hyperplasia)   . Carpal tunnel syndrome   . Cerebrovascular disease 10/03/2014  . Chronic back pain    chronic narcotic use  . Chronic hepatitis C ([name]) 1980   [place] 2016, cured. HCV RNA negative 01/2016  . Chronic pain   . Degenerative disc disease   . Depression   . Head trauma   . Hepatic cirrhosis ([name]) 02/19/2016  . Hypertension   . Hyperthyroidism   . Memory difficulties 10/03/2014  . Partial complex seizure disorder without intractable epilepsy ([name]) 10/03/2014  . REM behavioral disorder 09/22/2018  . Seizures ([name])    Epilepsy  . Stroke [facility]) 2012   left hemiplegia  . TIA (transient ischemic attack)   . Tremor   . Tremor, essential 10/02/2016  . UTI (lower urinary  tract infection)     Outpatient Medications Prior to Visit  Medication Sig Dispense Refill  . albuterol (PROVENTIL HFA;VENTOLIN HFA) 108 (90 Base) MCG/ACT inhaler Inhale 2 puffs into the lungs every 6 (six) hours as needed for wheezing or shortness of breath. 1 Inhaler 2  . aspirin EC 81 MG tablet Take 81 mg by mouth daily.    . divalproex (DEPAKOTE) 500 MG DR tablet TAKE (1) TABLET TWICE DAILY. 180 tablet 0  . docusate sodium (COLACE) 100 MG capsule Take 100 mg by mouth 2 (two) times daily.     . DULoxetine (CYMBALTA) 30 MG capsule Take 90 mg by mouth daily.    Marland Kitchen gabapentin (NEURONTIN) 300  MG capsule Take 600 mg by mouth 3 (three) times daily as needed.     . hydrALAZINE (APRESOLINE) 25 MG tablet Take 25 mg by mouth as needed. Sometimes holds if bp is low    . megestrol (MEGACE) 20 MG tablet Take 20 mg by mouth daily.    . Melatonin 3 MG TABS Take 2 tablets by mouth as needed.    . Menthol-Methyl Salicylate (MUSCLE RUB EX) Apply 1 application topically as needed (leg and back pain).    . polyethylene glycol powder (GLYCOLAX/MIRALAX) powder Take one capful every evening on days you do not have a good bowel movement. (Patient taking differently: as needed. Take one capful every evening on days you do not have a good bowel movement.) 527 g 5  . Tamsulosin HCl (FLOMAX) 0.4 MG CAPS Take 0.4 mg by mouth every evening.     . traZODone (DESYREL) 100 MG tablet Take 100 mg by mouth at bedtime as needed for sleep.     Marland Kitchen alendronate (FOSAMAX) 10 MG tablet Take 10 mg by mouth once a week. Take with a full glass of water on an empty stomach.     . Cholecalciferol (VITAMIN D) 2000 units CAPS Take 2,000 Units by mouth daily.     . clopidogrel (PLAVIX) 75 MG tablet Take 75 mg by mouth daily.     . divalproex (DEPAKOTE ER) 500 MG 24 hr tablet Take 2 tablets (1,000 mg total) by mouth daily.    . fludrocortisone (FLORINEF) 0.1 MG tablet Take 1 tablet (0.1 mg total) by mouth daily. 90 tablet 1  . lisinopril (ZESTRIL) 20 MG tablet Take 1 tablet (20 mg total) by mouth daily. 90 tablet 1  . methimazole (TAPAZOLE) 5 MG tablet Take 5 mg by mouth daily.    . Oxycodone HCl 10 MG TABS Take 10 mg by mouth every 6 (six) hours as needed.         Objective:     Pulse 89   SpO2 96%   SpO2: 96 % RA   W/c bound chronically ill wm    HEENT : pt wearing mask not removed for exam due to covid - 19 concerns.    NECK :  without JVD/Nodes/TM/ nl carotid upstrokes bilaterally   LUNGS: no acc muscle use,  Mild barrel  contour chest wall with bilateral  Distant bs s audible wheeze and  without cough on insp or  exp maneuvers  and mild  Hyperresonant  to  percussion bilaterally     CV:  RRR  no s3 or murmur or increase in P2, and no edema   ABD:  soft and nontender with pos end  insp Hoover's  in the supine position. No bruits or organomegaly appreciated, bowel sounds nl  MS:    ext  warm with diffuse severe muscle wasting , calf tenderness, cyanosis or clubbing No obvious joint restrictions   SKIN: warm and dry without lesions    NEURO:  alert, depressed appearing, lets wife answer all the questions with  no motor or cerebellar deficits apparent.     I personally reviewed images and agree with radiology impression as follows:   Chest CT 04/28/18 Lungs/Pleura: Lungs are clear. No pleural effusion or pneumothorax.    Mediastinum/Nodes: No enlarged mediastinal, hilar, or axillary lymph nodes. Thyroid gland, trachea, and esophagus demonstrate no significant findings.  CT 03/22/2019 abd :  Irregular contour the liver suggestive of cirrhosis. Again seen within left lobe of liver is a arterial phase enhancing lesion, image 25/3. On the portal venous phase images there is central washout with a pseudo capsule. The lesion measures 2.6 x 1.9 cm, image 21/4. On the previous exam this was measured at 1.8 x 1.9 Cm. IMPRESSION: 1. Increase in size of arterial phase enhancing lesion within the left lobe of liver. In a patient that is at increased risk this lesion has enhancement characteristics compatible with LR-5 liver lesion, definite for [facility].     Assessment   COPD GOLD 0 / active smoker Active smoker - PFT's  02/08/16  FEV1 2.51 (79 % ) ratio 0.73  p 14 % improvement from saba p ? prior to study with DLCO  23.27 (78%) corrects to 4.57 (101%)  for alv volume and FV curve min concavity    As I explained to this patient and wife in detail:  although there may be some mild copd present, it does not appear to be clinically relevant as it is not  limiting activity tolerance   That is to say:   this  pt is so sedentary I don't recommend aggressive pulmonary rx at this point unless limiting symptoms arise or acute exacerbations become as issue, neither of which is the case now.  I asked the patient/wife  to contact this office at any time in the future should either of these problems arise.    In meantime ok to use saba prn - The proper method of use, as well as anticipated side effects, of a metered-dose inhaler are discussed and demonstrated to the patient.   Liver lesion, left lobe Did not directly discuss the CT findings above but most likely he has [place] and would appear to be a very poor candidate for anthing but palliative rx at this point and appears hospice appropriate from many different perspectives.    Total time devoted to counseling  > 50 % of initial 60 min office visit:  review case with pt/wife and  performed device teaching  using a teach back technique which also  extended face to face time for this visit (see above)  discussion of options/alternatives/ personally creating written customized instructions  in presence of pt  then going over those specific  Instructions directly with the pt including how to use all of the meds but in particular covering each new medication in detail and the difference between the maintenance= "automatic" meds and the prns using an action plan format for the latter (If this problem/symptom => do that organization reading Left to right).  Please see AVS from this visit for a full list of these instructions which I personally wrote for this pt and  are unique to this visit.      Christinia Gully, MD 03/22/2019

## 2019-03-23 ENCOUNTER — Encounter: Payer: Self-pay | Admitting: Internal Medicine

## 2019-03-23 DIAGNOSIS — J449 Chronic obstructive pulmonary disease, unspecified: Secondary | ICD-10-CM | POA: Insufficient documentation

## 2019-03-23 NOTE — Assessment & Plan Note (Signed)
Did not directly discuss the CT findings above but most likely he has [place] and would appear to be a very poor candidate for anthing but palliative rx at this point and appears hospice appropriate from many different perspectives.

## 2019-03-23 NOTE — Assessment & Plan Note (Addendum)
Active smoker - PFT's  02/08/16  FEV1 2.51 (79 % ) ratio 0.73  p 14 % improvement from saba p ? prior to study with DLCO  23.27 (78%) corrects to 4.57 (101%)  for alv volume and FV curve min concavity    As I explained to this patient and wife in detail:  although there may be some mild copd present, it does not appear to be clinically relevant as it is not  limiting activity tolerance   That is to say:   this pt is so sedentary I don't recommend aggressive pulmonary rx at this point unless limiting symptoms arise or acute exacerbations become as issue, neither of which is the case now.  I asked the patient/wife  to contact this office at any time in the future should either of these problems arise.    In meantime ok to use saba prn - The proper method of use, as well as anticipated side effects, of a metered-dose inhaler are discussed and demonstrated to the patient.   Total time devoted to counseling  > 50 % of initial 60 min office visit:  review case with pt/wife and  performed device teaching  using a teach back technique which also  extended face to face time for this visit (see above)  discussion of options/alternatives/ personally creating written customized instructions  in presence of pt  then going over those specific  Instructions directly with the pt including how to use all of the meds but in particular covering each new medication in detail and the difference between the maintenance= "automatic" meds and the prns using an action plan format for the latter (If this problem/symptom => do that organization reading Left to right).  Please see AVS from this visit for a full list of these instructions which I personally wrote for this pt and  are unique to this visit.

## 2019-03-25 ENCOUNTER — Ambulatory Visit: Payer: Medicare Other | Admitting: Neurology

## 2019-04-01 ENCOUNTER — Telehealth: Payer: Self-pay | Admitting: Internal Medicine

## 2019-04-01 NOTE — Telephone Encounter (Signed)
Pt's wife was returning a call from [place] from yesterday. She is aware that [facility] will call her back around lunch time today. [phone]

## 2019-04-01 NOTE — Telephone Encounter (Signed)
Spoke with patient's wife.

## 2019-04-04 ENCOUNTER — Other Ambulatory Visit: Payer: Self-pay | Admitting: *Deleted

## 2019-04-04 ENCOUNTER — Other Ambulatory Visit: Payer: Self-pay | Admitting: Gastroenterology

## 2019-04-04 DIAGNOSIS — K7460 Unspecified cirrhosis of liver: Secondary | ICD-10-CM

## 2019-04-04 DIAGNOSIS — C220 Liver cell carcinoma: Secondary | ICD-10-CM

## 2019-04-04 MED ORDER — DRONABINOL 2.5 MG PO CAPS: 2.5000 mg | ORAL_CAPSULE | Freq: Two times a day (BID) | ORAL | 0 refills | Status: AC

## 2019-04-05 ENCOUNTER — Telehealth: Payer: Self-pay

## 2019-04-05 DIAGNOSIS — Z8673 Personal history of transient ischemic attack (TIA), and cerebral infarction without residual deficits: Secondary | ICD-10-CM | POA: Diagnosis not present

## 2019-04-05 DIAGNOSIS — G894 Chronic pain syndrome: Secondary | ICD-10-CM | POA: Diagnosis not present

## 2019-04-05 NOTE — Telephone Encounter (Signed)
PA submitted through [email or web address] for Dronabinol 2.5 mg. Waiting on an approval or denial.

## 2019-04-10 NOTE — Progress Notes (Signed)
This encounter was created in error - please disregard.

## 2019-04-11 ENCOUNTER — Telehealth: Payer: Self-pay | Admitting: Neurology

## 2019-04-11 ENCOUNTER — Encounter: Payer: Medicare Other | Admitting: Neurology

## 2019-04-11 NOTE — Telephone Encounter (Signed)
The patient was scheduled for a VV today. They were not able to log on. I called his wife. She reports Eric Tran is now in hospice care for failure to thrive and liver cancer. Hospice has assumed his depakote prescription. A few weeks ago he stopped taking all his medications. He is now taking depakote sprinkles. He is now down to 90 lbs. His wife wishes for hospice to assume his care, they will contact us going forward for any issues or consultation needed.

## 2019-04-14 ENCOUNTER — Ambulatory Visit
Admission: RE | Admit: 2019-04-14 | Discharge: 2019-04-14 | Disposition: A | Discharge status: Home / Self Care | Payer: Medicare Other | Source: Ambulatory Visit | Attending: Gastroenterology | Admitting: Gastroenterology

## 2019-04-14 ENCOUNTER — Other Ambulatory Visit: Payer: Self-pay

## 2019-04-14 ENCOUNTER — Encounter: Payer: Self-pay | Admitting: *Deleted

## 2019-04-14 DIAGNOSIS — C220 Liver cell carcinoma: Secondary | ICD-10-CM

## 2019-04-14 HISTORY — PX: IR RADIOLOGIST EVAL & MGMT: IMG5224

## 2019-04-14 NOTE — Consult Note (Signed)
Chief Complaint: Patient was consulted remotely today (TeleHealth) for liver lesion at the request of EricLeslie Tran.    Referring Physician(Tran): EricLeslie Tran  History of Present Illness: Eric Tran is a 68 y.o. male with multiple medical problems including failure to thrive related to a multiple CVAs and dementia.  Patient was referred to IR for an enlarging liver lesion that is suggestive for hepatocellular carcinoma.  Past medical history is significant for history of hepatitis C and the patient was treated with Harvoni in 2016-2017 at the [place].  Patient is also an active smoker.  The visit was performed on the telephone and I predominately spoke with the patient'Tran wife.  The patient has been on hospice care since July and the wife reports marked deterioration in his performance status over the past 6 months.  6 months ago he was walking around and performing many of his daily activities.  Currently he is bedridden with a Foley catheter.  Patient'Tran mental status has been deteriorating and he is confused or disoriented a large portion of the time.  Patient'Tran wife is looking for recommendations for treatment of the enlarging liver lesion.  She feels like he may be having symptoms related to the liver tumor because of new complaints of itchiness and tea colored urine.  Past Medical History:  Diagnosis Date  . Abdominal aortic aneurysm ([place])   . Abnormality of gait 10/03/2014  . Anxiety   . Arthritis   . BPH (benign prostatic hyperplasia)   . Carpal tunnel syndrome   . Cerebrovascular disease 10/03/2014  . Chronic back pain    chronic narcotic use  . Chronic hepatitis C ([name]) 1980   Harvoni 2016, cured. HCV RNA negative 01/2016  . Chronic pain   . Degenerative disc disease   . Depression   . Head trauma   . Hepatic cirrhosis ([name]) 02/19/2016  . Hypertension   . Hyperthyroidism   . Memory difficulties 10/03/2014  . Partial complex seizure disorder without intractable epilepsy ([place])  10/03/2014  . REM behavioral disorder 09/22/2018  . Seizures ([place])    Epilepsy  . Stroke [name]) 2012   left hemiplegia  . TIA (transient ischemic attack)   . Tremor   . Tremor, essential 10/02/2016  . UTI (lower urinary tract infection)     Past Surgical History:  Procedure Laterality Date  . EP IMPLANTABLE DEVICE N/A 11/15/2014   Procedure: Loop Recorder Insertion;  Surgeon: Thompson Grayer, MD;  Location: [place] [facility];  Service: Cardiovascular;  Laterality: N/A;  . ESOPHAGOGASTRODUODENOSCOPY (EGD) WITH PROPOFOL N/A 03/20/2016   Procedure: ESOPHAGOGASTRODUODENOSCOPY (EGD) WITH PROPOFOL;  Surgeon: Daneil Dolin, MD;  Location[facility];  Service: Endoscopy;  Laterality: N/A;  . ESOPHAGOGASTRODUODENOSCOPY (EGD) WITH PROPOFOL N/A 03/22/2018   Dr. Emerson Monte: Portal hypertensive gastropathy, next EGD in 2 years.  Marland Kitchen HAND SURGERY     right  . implantable loop recorder removal  02/17/2018   MDT LINQ removed by Dr Rayann Heman for end of servce  . NECK SURGERY    . right hand reconstruction      Allergies: Keppra [levetiracetam], Adhesive [tape], Methadone, Morphine and related, and Tramadol  Medications: Prior to Admission medications   Medication Sig Start Date End Date Taking? Authorizing Provider  albuterol (VENTOLIN HFA) 108 (90 Base) MCG/ACT inhaler Inhale 2 puffs into the lungs every 4 (four) hours as needed for wheezing or shortness of breath. 03/22/19   Tanda Rockers, MD  ALPRAZolam Duanne Moron) 0.5 MG tablet Take  0.5 mg by mouth every 4 (four) hours as needed for anxiety.    [provider]  aspirin EC 81 MG tablet Take 81 mg by mouth daily.    [provider]  divalproex (DEPAKOTE) 500 MG DR tablet TAKE (1) TABLET TWICE DAILY. 02/03/19   Kathrynn Ducking, MD  docusate sodium (COLACE) 100 MG capsule Take 100 mg by mouth 2 (two) times daily.     [provider]  dronabinol (MARINOL) 2.5 MG capsule Take 1 capsule (2.5 mg total) by mouth 2 (two) times daily before a  meal. 04/04/19   Mahala Menghini, PA-C  DULoxetine (CYMBALTA) 30 MG capsule Take 90 mg by mouth daily.    [provider]  gabapentin (NEURONTIN) 300 MG capsule Take 600 mg by mouth 3 (three) times daily as needed.     [provider]  hydrALAZINE (APRESOLINE) 25 MG tablet Take 25 mg by mouth as needed. Sometimes holds if bp is low    [provider]  megestrol (MEGACE) 20 MG tablet Take 20 mg by mouth daily.    [provider]  Melatonin 3 MG TABS Take 2 tablets by mouth as needed.    [provider]  Menthol-Methyl Salicylate (MUSCLE RUB EX) Apply 1 application topically as needed (leg and back pain).    [provider]  morphine (KADIAN) 60 MG 24 hr capsule Take 60 mg by mouth every 8 (eight) hours as needed for pain.    [provider]  polyethylene glycol powder (GLYCOLAX/MIRALAX) powder Take one capful every evening on days you do not have a good bowel movement. Patient taking differently: as needed. Take one capful every evening on days you do not have a good bowel movement. 06/25/17   Mahala Menghini, PA-C  Tamsulosin HCl (FLOMAX) 0.4 MG CAPS Take 0.4 mg by mouth every evening.     [provider]  traZODone (DESYREL) 100 MG tablet Take 100 mg by mouth at bedtime as needed for sleep.     [provider]     Family History  Problem Relation Age of Onset  . Cervical cancer Mother   . Heart failure Father   . Lung cancer Sister   . Breast cancer Sister   . Colon cancer Neg Hx   . Colon polyps Neg Hx     Social History   Socioeconomic History  . Marital status: Married    Spouse name: Not on file  . Number of children: 2  . Years of education: GED  . Highest education level: Not on file  Occupational History  . Occupation: unemployed    Fish farm manager: UNEMPLOYED  Social Needs  . Financial resource strain: Not on file  . Food insecurity    Worry: Not on file    Inability: Not on file  .  Transportation needs    Medical: Not on file    Non-medical: Not on file  Tobacco Use  . Smoking status: Current Every Day Smoker    Packs/day: 1.00    Years: 45.00    Pack years: 45.00    Types: Cigarettes    Start date: 07/18/1962  . Smokeless tobacco: Never Used  Substance and Sexual Activity  . Alcohol use: No    Alcohol/week: 0.0 standard drinks    Comment: occasionally  . Drug use: No    Types: Marijuana    Comment: prior cocaine/ marijuana  . Sexual activity: Yes    Partners: Female  Comment: hx multiple sexual partners  Lifestyle  . Physical activity    Days per week: Not on file    Minutes per session: Not on file  . Stress: Not on file  Relationships  . Social Herbalist on phone: Not on file    Gets together: Not on file    Attends religious service: Not on file    Active member of club or organization: Not on file    Attends meetings of clubs or organizations: Not on file    Relationship status: Not on file  Other Topics Concern  . Not on file  Social History Narrative   1 daughter committed suicide 2008   Patient right handed.   Patient drinks about 6-7 cups of caffeine daily.       ECOG Status: 4 - Bedbound  Review of Systems  Constitutional: Positive for fatigue and unexpected weight change.  Genitourinary:       Tea colored urine.  Musculoskeletal:       Itchiness  Neurological: Positive for weakness.     Physical Exam No direct physical exam was performed   Vital Signs: There were no vitals taken for this visit.  Imaging: Ct Abdomen W Wo Contrast  Result Date: 03/22/2019 CLINICAL DATA:  Evaluate liver lesion. Increased risk of hepatoma. [place]. EXAM: CT ABDOMEN WITHOUT AND WITH CONTRAST TECHNIQUE: Multidetector CT imaging of the abdomen was performed following the standard protocol before and following the bolus administration of intravenous contrast. CONTRAST:  13mL OMNIPAQUE IOHEXOL 300 MG/ML  SOLN COMPARISON:  10/20/2018  FINDINGS: Lower chest: Scar noted in the posterior right lower lobe. No pleural effusion. Hepatobiliary: Irregular contour the liver suggestive of cirrhosis. Again seen within left lobe of liver is a arterial phase enhancing lesion, image 25/3. On the portal venous phase images there is central washout with a pseudo capsule. The lesion measures 2.6 x 1.9 cm, image 21/4. On the previous exam this was measured at 1.8 x 1.9 cm. No additional lesions identified. Gallbladder is unremarkable. No increase caliber of the common bile duct which measures up to 9 mm. No choledocholithiasis or obstructing mass identified. Pancreas: No pancreatic inflammation or mass identified. At the head of pancreas there is increase caliber of the main duct which measures 4 mm. Spleen: Normal in size without focal abnormality. Adrenals/Urinary Tract: Right kidney atrophy scratch set asymmetric scarring and mild atrophy of the right kidney. Bilateral too small to reliably characterize kidney lesions identified. The largest is in the upper pole of the left kidney measuring 6 mm. Scattered cortical calcifications within the right kidney. No enhancing mass or hydronephrosis identified at this time. Stomach/Bowel: Stomach is within normal limits. Appendix appears normal. No evidence of bowel wall thickening, distention, or inflammatory changes. Vascular/Lymphatic: Aortic atherosclerosis. The infrarenal abdominal aorta has a normal caliber without aneurysm. Imaged portions of the lower chest show descending thoracic aortic aneurysm with eccentric mural thrombus measuring up to 4.1 cm. No abdominopelvic adenopathy. Other: No free fluid or fluid collections. Musculoskeletal: No acute or significant osseous findings. IMPRESSION: 1. Increase in size of arterial phase enhancing lesion within the left lobe of liver. In a patient that is at increased risk this lesion has enhancement characteristics compatible with LR-5 liver lesion, definite for [facility]. 2.  Increase caliber of the common bile duct and main pancreatic duct. No obstructing stone or mass noted. 3.  Aortic Atherosclerosis (ICD10-I70.0). 4. Aneurysmal dilatation of the visualized portions of the distal descending thoracic aorta. Electronically  Signed   By: Kerby Moors M.D.   On: 03/22/2019 14:34    Labs:  CBC: Recent Labs    09/22/18 1125  WBC 5.9  HGB 14.3  HCT 40.6  PLT 201    COAGS: No results for input(Tran): INR, APTT in the last 8760 hours.  BMP: Recent Labs    09/22/18 1125 03/22/19 1042  NA 141  --   K 4.5  --   CL 98  --   CO2 21  --   GLUCOSE 91  --   BUN 12 8  CALCIUM 9.0  --   CREATININE 0.72* 0.53*  GFRNONAA 96 >60  GFRAA 111 >60    LIVER FUNCTION TESTS: Recent Labs    09/22/18 1125  BILITOT 0.7  AST 18  ALT 9  ALKPHOS 49  PROT 6.2  ALBUMIN 4.1    TUMOR MARKERS: No results for input(Tran): AFPTM, CEA, CA199, CHROMGRNA in the last 8760 hours.  Assessment and Plan:  68 year old male with multiple medical problems including failure to thrive secondary to multiple CVAs and dementia.  Patient currently has very poor performance status and predominantly bedridden.  Patient has a history of hepatitis C with an enlarging enhancing lesion in the left hepatic lobe.  Lesion characteristics on imaging are compatible with a LR 5 lesion and suggestive for hepatocellular carcinoma.  Imaging findings are concerning for hepatocellular carcinoma although the patient'Tran AFP level is within normal range.  Most recent imaging 03/22/2019 demonstrates that the lesion measures 2.6 x 1.9 cm.  Discussed treatment options with the patient'Tran wife.  If the patient was a surgical candidate then this would be a good case for surgical resection.  Based on patient'Tran performance status, he is clearly not a surgical candidate.  Based on the size and location of the lesion, he would potentially be a candidate for percutaneous ablation or catheter directed embolization with bland  embolization, chemoembolization or even radioembolization.  However, based on my discussion with the wife and her description of his performance status, I do not think the patient is a candidate for liver directed therapy at this time.  He would likely require general anesthesia for an ablation which he probably would not tolerate at this point.  Catheter directed embolization of the lesion would be possible with moderate sedation but I do not think the patient is a good candidate at this time based on his poor performance status.  In addition, I do not have current liver labs but I am concerned that he may have worsening liver function based on the description of the patient'Tran symptoms.   Unfortunately, the lesion is enlarging and will have less options as the lesion grows.  Patient was unable to comment or participate in this visit due to his mental status and poor performance status.  I believe the patient'Tran wife has a good understanding of the treatment options for hepatocellular carcinoma and also understands the limitations for any treatment at this time based on his deterioration over the last 6 months.  Explained that we could reconsider liver directed therapy if his performance status improves.   Thank you for this interesting consult.  I greatly enjoyed meeting ZAKHARY ELENA and look forward to participating in their care.  A copy of this report was sent to the requesting provider on this date.  Electronically Signed: Burman Riis 04/14/2019, 11:42 AM   I spent a total of  15 Minutes   in remote  clinical consultation, greater than  50% of which was counseling/coordinating care for hepatocellular carcinoma peer.    Visit type: Audio only (telephone). Audio (no video) only due to patient'Tran lack of internet/smartphone capability. Alternative for in-person consultation at [facility], [address], [place], [place]. This visit type was conducted due to national recommendations for  restrictions regarding the COVID-19 Pandemic (e.g. social distancing).  This format is felt to be most appropriate for this patient at this time.  All issues noted in this document were discussed and addressed.

## 2019-04-19 ENCOUNTER — Telehealth: Payer: Self-pay | Admitting: Internal Medicine

## 2019-04-22 ENCOUNTER — Telehealth: Payer: Self-pay | Admitting: Internal Medicine

## 2019-04-22 NOTE — Telephone Encounter (Signed)
Pt's wife called to let us know that patient past away on Tuesday. I will send a sympathy card to the family.

## 2019-04-22 NOTE — Telephone Encounter (Signed)
Eric Tran, please see staff message first.

## 2019-04-25 NOTE — Telephone Encounter (Signed)
Pt has passed away.  

## 2019-04-25 NOTE — Telephone Encounter (Signed)
Noted. Sorry to hear this.  

## 2019-04-28 ENCOUNTER — Telehealth: Payer: Self-pay | Admitting: Neurology

## 2019-04-28 NOTE — Telephone Encounter (Signed)
Events noted

## 2019-04-28 NOTE — Telephone Encounter (Signed)
Pt's wife called to let us know that patient past away on Tuesday 16-May-2019

## 2019-05-01 NOTE — Telephone Encounter (Signed)
Routing to AM.  

## 2019-05-01 NOTE — Telephone Encounter (Signed)
PT ON RECALL FOR COLOGUARD

## 2019-05-01 DEATH — deceased

## 2020-03-20 ENCOUNTER — Other Ambulatory Visit: Payer: Self-pay | Admitting: Surgery

## 2020-03-20 DIAGNOSIS — I712 Thoracic aortic aneurysm, without rupture, unspecified: Secondary | ICD-10-CM

## 2020-03-20 DIAGNOSIS — R1907 Generalized intra-abdominal and pelvic swelling, mass and lump: Secondary | ICD-10-CM

## 2020-04-25 ENCOUNTER — Other Ambulatory Visit: Payer: Medicare Other

## 2020-04-25 ENCOUNTER — Ambulatory Visit: Payer: Medicare Other | Admitting: Surgery
